# Patient Record
Sex: Male | Born: 1945
Health system: Southern US, Community
[De-identification: ages and names within clinical notes are randomized; demographics above are authoritative.]

## PROBLEM LIST (undated history)

## (undated) DIAGNOSIS — R0989 Other specified symptoms and signs involving the circulatory and respiratory systems: Secondary | ICD-10-CM

## (undated) DIAGNOSIS — I10 Essential (primary) hypertension: Secondary | ICD-10-CM

## (undated) DIAGNOSIS — M1990 Unspecified osteoarthritis, unspecified site: Secondary | ICD-10-CM

## (undated) DIAGNOSIS — C61 Malignant neoplasm of prostate: Secondary | ICD-10-CM

## (undated) DIAGNOSIS — G609 Hereditary and idiopathic neuropathy, unspecified: Principal | ICD-10-CM

## (undated) DIAGNOSIS — R0609 Other forms of dyspnea: Secondary | ICD-10-CM

## (undated) DIAGNOSIS — M79609 Pain in unspecified limb: Secondary | ICD-10-CM

## (undated) DIAGNOSIS — F329 Major depressive disorder, single episode, unspecified: Secondary | ICD-10-CM

## (undated) DIAGNOSIS — K219 Gastro-esophageal reflux disease without esophagitis: Secondary | ICD-10-CM

## (undated) DIAGNOSIS — J449 Chronic obstructive pulmonary disease, unspecified: Secondary | ICD-10-CM

## (undated) DIAGNOSIS — E785 Hyperlipidemia, unspecified: Secondary | ICD-10-CM

## (undated) DIAGNOSIS — J45909 Unspecified asthma, uncomplicated: Secondary | ICD-10-CM

## (undated) DIAGNOSIS — G5600 Carpal tunnel syndrome, unspecified upper limb: Secondary | ICD-10-CM

## (undated) DIAGNOSIS — Z9861 Coronary angioplasty status: Secondary | ICD-10-CM

## (undated) HISTORY — DX: Other specified symptoms and signs involving the circulatory and respiratory systems: R09.89

## (undated) HISTORY — DX: Other forms of dyspnea: R06.09

## (undated) HISTORY — DX: Pain in unspecified limb: M79.609

## (undated) HISTORY — DX: Unspecified asthma, uncomplicated: J45.909

## (undated) HISTORY — DX: Hereditary and idiopathic neuropathy, unspecified: G60.9

## (undated) HISTORY — PX: ROBOT ASSISTED LAPAROSCOPIC RADICAL PROSTATECTOMY: SHX5141

## (undated) HISTORY — PX: HERNIA REPAIR: SHX51

## (undated) HISTORY — DX: Malignant neoplasm of prostate: C61

## (undated) HISTORY — DX: Coronary angioplasty status: Z98.61

## (undated) HISTORY — PX: HIATAL HERNIA REPAIR: SHX195

## (undated) HISTORY — DX: Carpal tunnel syndrome, unspecified upper limb: G56.00

## (undated) HISTORY — DX: Hyperlipidemia, unspecified: E78.5

## (undated) HISTORY — DX: Gastro-esophageal reflux disease without esophagitis: K21.9

## (undated) HISTORY — DX: Major depressive disorder, single episode, unspecified: F32.9

---

## 1995-09-15 HISTORY — PX: CARDIAC CATHETERIZATION: SHX172

## 1998-09-14 HISTORY — PX: LAPAROSCOPIC GASTRIC BANDING: SHX1100

## 2006-08-17 ENCOUNTER — Encounter: Admission: RE | Admit: 2006-08-17 | Discharge: 2006-08-17 | Payer: Self-pay | Admitting: Gastroenterology

## 2007-02-24 ENCOUNTER — Ambulatory Visit: Payer: Self-pay | Admitting: Cardiology

## 2007-02-28 ENCOUNTER — Encounter: Admission: RE | Admit: 2007-02-28 | Discharge: 2007-02-28 | Payer: Self-pay | Admitting: Internal Medicine

## 2007-03-10 ENCOUNTER — Ambulatory Visit: Payer: Self-pay

## 2007-03-10 ENCOUNTER — Encounter: Payer: Self-pay | Admitting: Cardiology

## 2007-03-23 ENCOUNTER — Ambulatory Visit: Payer: Self-pay | Admitting: Cardiology

## 2008-08-27 ENCOUNTER — Encounter: Admission: RE | Admit: 2008-08-27 | Discharge: 2008-08-27 | Payer: Self-pay | Admitting: Orthopaedic Surgery

## 2009-03-21 ENCOUNTER — Inpatient Hospital Stay (HOSPITAL_COMMUNITY): Admission: RE | Admit: 2009-03-21 | Discharge: 2009-03-22 | Payer: Self-pay | Admitting: Urology

## 2009-03-21 ENCOUNTER — Encounter: Payer: Self-pay | Admitting: Urology

## 2009-06-04 DIAGNOSIS — K219 Gastro-esophageal reflux disease without esophagitis: Secondary | ICD-10-CM | POA: Insufficient documentation

## 2009-06-04 DIAGNOSIS — R0609 Other forms of dyspnea: Secondary | ICD-10-CM | POA: Insufficient documentation

## 2009-06-04 DIAGNOSIS — Z9861 Coronary angioplasty status: Secondary | ICD-10-CM

## 2009-06-04 DIAGNOSIS — R0989 Other specified symptoms and signs involving the circulatory and respiratory systems: Secondary | ICD-10-CM

## 2009-06-04 DIAGNOSIS — M79609 Pain in unspecified limb: Secondary | ICD-10-CM | POA: Insufficient documentation

## 2009-06-04 HISTORY — DX: Other forms of dyspnea: R06.09

## 2009-06-04 HISTORY — DX: Coronary angioplasty status: Z98.61

## 2009-06-04 HISTORY — DX: Other specified symptoms and signs involving the circulatory and respiratory systems: R09.89

## 2009-06-04 HISTORY — DX: Pain in unspecified limb: M79.609

## 2009-06-05 ENCOUNTER — Encounter: Admission: RE | Admit: 2009-06-05 | Discharge: 2009-06-05 | Payer: Self-pay | Admitting: General Surgery

## 2009-06-26 ENCOUNTER — Inpatient Hospital Stay (HOSPITAL_COMMUNITY): Admission: RE | Admit: 2009-06-26 | Discharge: 2009-07-05 | Payer: Self-pay | Admitting: General Surgery

## 2010-09-14 HISTORY — PX: MENISCUS REPAIR: SHX5179

## 2010-12-18 LAB — CBC
HCT: 36.6 % — ABNORMAL LOW (ref 39.0–52.0)
HCT: 37.7 % — ABNORMAL LOW (ref 39.0–52.0)
HCT: 38 % — ABNORMAL LOW (ref 39.0–52.0)
HCT: 40 % (ref 39.0–52.0)
HCT: 38.6 % — ABNORMAL LOW (ref 39.0–52.0)
HCT: 41.7 % (ref 39.0–52.0)
Hemoglobin: 12.6 g/dL — ABNORMAL LOW (ref 13.0–17.0)
Hemoglobin: 13 g/dL (ref 13.0–17.0)
Hemoglobin: 13.1 g/dL (ref 13.0–17.0)
Hemoglobin: 13.6 g/dL (ref 13.0–17.0)
Hemoglobin: 13.3 g/dL (ref 13.0–17.0)
Hemoglobin: 14 g/dL (ref 13.0–17.0)
MCHC: 34.1 g/dL (ref 30.0–36.0)
MCHC: 34.4 g/dL (ref 30.0–36.0)
MCHC: 34.5 g/dL (ref 30.0–36.0)
MCHC: 34.5 g/dL (ref 30.0–36.0)
MCHC: 33.7 g/dL (ref 30.0–36.0)
MCHC: 34.5 g/dL (ref 30.0–36.0)
MCV: 95.3 fL (ref 78.0–100.0)
MCV: 95.4 fL (ref 78.0–100.0)
MCV: 95.6 fL (ref 78.0–100.0)
MCV: 96.4 fL (ref 78.0–100.0)
MCV: 95.6 fL (ref 78.0–100.0)
MCV: 95.7 fL (ref 78.0–100.0)
Platelets: 181 10*3/uL (ref 150–400)
Platelets: 235 10*3/uL (ref 150–400)
Platelets: 240 10*3/uL (ref 150–400)
Platelets: 244 10*3/uL (ref 150–400)
Platelets: 218 10*3/uL (ref 150–400)
Platelets: 220 10*3/uL (ref 150–400)
RBC: 3.84 MIL/uL — ABNORMAL LOW (ref 4.22–5.81)
RBC: 3.95 MIL/uL — ABNORMAL LOW (ref 4.22–5.81)
RBC: 3.97 MIL/uL — ABNORMAL LOW (ref 4.22–5.81)
RBC: 4.15 MIL/uL — ABNORMAL LOW (ref 4.22–5.81)
RBC: 4.04 MIL/uL — ABNORMAL LOW (ref 4.22–5.81)
RBC: 4.35 MIL/uL (ref 4.22–5.81)
RDW: 13.5 % (ref 11.5–15.5)
RDW: 13.8 % (ref 11.5–15.5)
RDW: 13.9 % (ref 11.5–15.5)
RDW: 14.3 % (ref 11.5–15.5)
RDW: 14.1 % (ref 11.5–15.5)
RDW: 14.1 % (ref 11.5–15.5)
WBC: 10.8 10*3/uL — ABNORMAL HIGH (ref 4.0–10.5)
WBC: 11.2 10*3/uL — ABNORMAL HIGH (ref 4.0–10.5)
WBC: 12.2 10*3/uL — ABNORMAL HIGH (ref 4.0–10.5)
WBC: 13.5 10*3/uL — ABNORMAL HIGH (ref 4.0–10.5)
WBC: 14.1 10*3/uL — ABNORMAL HIGH (ref 4.0–10.5)
WBC: 8.4 10*3/uL (ref 4.0–10.5)

## 2010-12-18 LAB — URINALYSIS, ROUTINE W REFLEX MICROSCOPIC
Glucose, UA: NEGATIVE mg/dL
Hgb urine dipstick: NEGATIVE
Ketones, ur: 15 mg/dL — AB
Nitrite: NEGATIVE
Protein, ur: 30 mg/dL — AB
Specific Gravity, Urine: 1.026 (ref 1.005–1.030)
Urobilinogen, UA: 0.2 mg/dL (ref 0.0–1.0)
pH: 8 (ref 5.0–8.0)

## 2010-12-18 LAB — BASIC METABOLIC PANEL
BUN: 11 mg/dL (ref 6–23)
BUN: 12 mg/dL (ref 6–23)
BUN: 9 mg/dL (ref 6–23)
BUN: 12 mg/dL (ref 6–23)
CO2: 31 mEq/L (ref 19–32)
CO2: 31 mEq/L (ref 19–32)
CO2: 34 mEq/L — ABNORMAL HIGH (ref 19–32)
CO2: 30 mEq/L (ref 19–32)
Calcium: 8.3 mg/dL — ABNORMAL LOW (ref 8.4–10.5)
Calcium: 8.3 mg/dL — ABNORMAL LOW (ref 8.4–10.5)
Calcium: 8.6 mg/dL (ref 8.4–10.5)
Calcium: 8.8 mg/dL (ref 8.4–10.5)
Chloride: 96 mEq/L (ref 96–112)
Chloride: 99 mEq/L (ref 96–112)
Chloride: 99 mEq/L (ref 96–112)
Chloride: 102 mEq/L (ref 96–112)
Creatinine, Ser: 1.19 mg/dL (ref 0.4–1.5)
Creatinine, Ser: 1.28 mg/dL (ref 0.4–1.5)
Creatinine, Ser: 1.37 mg/dL (ref 0.4–1.5)
Creatinine, Ser: 1.33 mg/dL (ref 0.4–1.5)
GFR calc Af Amer: >60 mL/min (ref >60)
GFR calc Af Amer: >60 mL/min (ref >60)
GFR calc Af Amer: >60 mL/min (ref >60)
GFR calc Af Amer: >60 mL/min (ref >60)
GFR calc non Af Amer: 53 mL/min — ABNORMAL LOW (ref >60)
GFR calc non Af Amer: 57 mL/min — ABNORMAL LOW (ref >60)
GFR calc non Af Amer: >60 mL/min (ref >60)
GFR calc non Af Amer: 54 mL/min — ABNORMAL LOW (ref >60)
Glucose, Bld: 139 mg/dL — ABNORMAL HIGH (ref 70–99)
Glucose, Bld: 147 mg/dL — ABNORMAL HIGH (ref 70–99)
Glucose, Bld: 153 mg/dL — ABNORMAL HIGH (ref 70–99)
Glucose, Bld: 155 mg/dL — ABNORMAL HIGH (ref 70–99)
Potassium: 3.8 mEq/L (ref 3.5–5.1)
Potassium: 3.9 mEq/L (ref 3.5–5.1)
Potassium: 4 mEq/L (ref 3.5–5.1)
Potassium: 3.7 mEq/L (ref 3.5–5.1)
Sodium: 134 mEq/L — ABNORMAL LOW (ref 135–145)
Sodium: 136 mEq/L (ref 135–145)
Sodium: 138 mEq/L (ref 135–145)
Sodium: 139 mEq/L (ref 135–145)

## 2010-12-18 LAB — COMPREHENSIVE METABOLIC PANEL
ALT: 24 U/L (ref 0–53)
AST: 23 U/L (ref 0–37)
Albumin: 4.1 g/dL (ref 3.5–5.2)
Alkaline Phosphatase: 66 U/L (ref 39–117)
BUN: 15 mg/dL (ref 6–23)
CO2: 29 mEq/L (ref 19–32)
Calcium: 9.7 mg/dL (ref 8.4–10.5)
Chloride: 103 mEq/L (ref 96–112)
Creatinine, Ser: 1.25 mg/dL (ref 0.4–1.5)
GFR calc Af Amer: >60 mL/min (ref >60)
GFR calc non Af Amer: 59 mL/min — ABNORMAL LOW (ref >60)
Glucose, Bld: 88 mg/dL (ref 70–99)
Potassium: 4.4 mEq/L (ref 3.5–5.1)
Sodium: 138 mEq/L (ref 135–145)
Total Bilirubin: 0.5 mg/dL (ref 0.3–1.2)
Total Protein: 7.1 g/dL (ref 6.0–8.3)

## 2010-12-18 LAB — CLOSTRIDIUM DIFFICILE EIA
C difficile Toxins A+B, EIA: NEGATIVE
C difficile Toxins A+B, EIA: NEGATIVE

## 2010-12-18 LAB — URINE MICROSCOPIC-ADD ON

## 2010-12-18 LAB — DIFFERENTIAL
Basophils Absolute: 0.1 10*3/uL (ref 0.0–0.1)
Basophils Relative: 1 % (ref 0–1)
Eosinophils Absolute: 0.2 10*3/uL (ref 0.0–0.7)
Eosinophils Relative: 2 % (ref 0–5)
Lymphocytes Relative: 37 % (ref 12–46)
Lymphs Abs: 3 10*3/uL (ref 0.7–4.0)
Monocytes Absolute: 0.6 10*3/uL (ref 0.1–1.0)
Monocytes Relative: 7 % (ref 3–12)
Neutro Abs: 4.4 10*3/uL (ref 1.7–7.7)
Neutrophils Relative %: 54 % (ref 43–77)

## 2010-12-18 LAB — MAGNESIUM: Magnesium: 2.2 mg/dL (ref 1.5–2.5)

## 2010-12-21 LAB — CREATININE, FLUID (PLEURAL, PERITONEAL, JP DRAINAGE): Creat, Fluid: 31.8 mg/dL

## 2010-12-21 LAB — HEMOGLOBIN AND HEMATOCRIT, BLOOD
HCT: 34.7 % — ABNORMAL LOW (ref 39.0–52.0)
HCT: 39.1 % (ref 39.0–52.0)
Hemoglobin: 11.6 g/dL — ABNORMAL LOW (ref 13.0–17.0)
Hemoglobin: 13.2 g/dL (ref 13.0–17.0)

## 2010-12-21 LAB — TYPE AND SCREEN
ABO/RH(D): O POS
Antibody Screen: NEGATIVE

## 2010-12-21 LAB — ABO/RH: ABO/RH(D): O POS

## 2010-12-22 LAB — CBC
HCT: 42.2 % (ref 39.0–52.0)
Hemoglobin: 14.3 g/dL (ref 13.0–17.0)
MCHC: 34 g/dL (ref 30.0–36.0)
MCV: 94.9 fL (ref 78.0–100.0)
Platelets: 225 10*3/uL (ref 150–400)
RBC: 4.44 MIL/uL (ref 4.22–5.81)
RDW: 13.9 % (ref 11.5–15.5)
WBC: 8.1 10*3/uL (ref 4.0–10.5)

## 2010-12-22 LAB — BASIC METABOLIC PANEL
BUN: 17 mg/dL (ref 6–23)
CO2: 31 mEq/L (ref 19–32)
Calcium: 9.2 mg/dL (ref 8.4–10.5)
Chloride: 104 mEq/L (ref 96–112)
Creatinine, Ser: 1.28 mg/dL (ref 0.4–1.5)
GFR calc Af Amer: >60 mL/min (ref >60)
GFR calc non Af Amer: 57 mL/min — ABNORMAL LOW (ref >60)
Glucose, Bld: 119 mg/dL — ABNORMAL HIGH (ref 70–99)
Potassium: 4.2 mEq/L (ref 3.5–5.1)
Sodium: 139 mEq/L (ref 135–145)

## 2011-01-27 NOTE — Assessment & Plan Note (Signed)
 HEALTHCARE                            CARDIOLOGY OFFICE NOTE   NAME:[name] [name], [name] [name]                       MRN:          [id number]  DATE:03/23/2007                            DOB:          05-Apr-1946    PRIMARY [name]:  [name], NP.   REASON FOR VISIT:  Followup cardiac testing.   HISTORY OF PRESENT ILLNESS:  I saw [name] [name] in June.  He was referred  for baseline cardiac risk stratification, as well as assessment for  potential claudication.  His echocardiogram demonstrated an ejection  fraction of 45% to 50% with possible inferior posterior hypokinesis,  although this was a technically difficult study with poor sound wave  transmission, and his ejection fraction was actually normal by Myoview  with no indication of wall motion abnormality.  His Myoview indicated an  ejection fraction of 64% with no evidence of ischemia and no  electrocardiographic changes to suggest ischemia.  I reviewed this with  the patient and his wife today.  In addition, he had lower extremity  arterial studies which were essentially normal with normal ABIs.  Based  on this, my recommendation would be risk factor modification.   ALLERGIES:  CONTRAST DYE, CODEINE, and OXYCODONE.   PRESENT MEDICATIONS:  1. Imodium 2 mg p.o. b.i.d.  2. Singulair 10 mg p.o. daily.  3. Spiriva p.r.n.  4. Combivent p.r.n.  5. Darvocet p.r.n.   REVIEW OF SYSTEMS:  As described in the history of present illness.   PHYSICAL EXAMINATION:  Blood pressure 122/80, heart rate 86, weight is  250 pounds.  Patient is comfortable and in no acute distress.  There has been no significant change in his examination which is  documented in the previous note.   IMPRESSION/RECOMMENDATIONS:  Dyspnea on exertion as outlined previously.  Cardiovascular testing has been reassuring, and, overall, low risk.  At  this point, would anticipate basic lifestyle modification with diet and  weight loss, as well  as regular exercise as tolerated.  If he manifests  progressive symptoms, we could consider additional cardiology  evaluation, although at this point do not anticipate any additional  studies.  He will continue to follow up with his primary care [name]  and we can see him back as needed.     Jonelle Sidle, MD  Electronically Signed    SGM/MedQ  DD: 03/23/2007  DT: 03/23/2007  Job #: [id number]   cc:   [name], NP  [name], M.D.

## 2011-01-27 NOTE — Assessment & Plan Note (Signed)
[facility] HEALTHCARE                            CARDIOLOGY OFFICE NOTE   NAME:[name], [name] SCHEIDT                       MRN:          [id number]  DATE:02/24/2007                            DOB:          [birth year]    REFERRING PHYSICIAN:  [name], NP   REASON FOR CONSULTATION:  Dyspnea on exertion and lower extremity pain.   HISTORY OF PRESENT ILLNESS:  [name] [name] is a pleasant 65 year old male,  retired Hotel manager, with a history of gastroesophageal reflux disease with  previous hiatal hernia repair and gastric banding at [facility] in  2000. It sounds as if he has been diagnosed with possible exercise-  induced asthma and perhaps even exacerbation of asthma due to reflux  disease. He also underwent previous cardiac catheterization in 1997,  which reportedly showed no major obstructive disease. He reports a 3 to  4 year history of progressive dyspnea on exertion, much worse over the  last several months. He also experiences heaviness in his legs with  exertion. He has no frank chest pain or palpitations. He is also limited  by back and hip discomfort. He was just recently seen by rheumatologist  and is being evaluated for osteoarthritis, rule out ankylosing  spondylitis.   His resting electrocardiogram today is normal showing sinus rhythm of 70  beats per minute and he has had no interval ischemic testing over the  last ten years. We have been asked to evaluate him further.   ALLERGIES:  X-RAY CONTRAST DYE, CODEINE, OXYCODONE.   CURRENT MEDICATIONS:  1. Loperamide 2 mg p.o. b.i.d.  2. Singulair 10 mg p.o. daily.  3. Spiriva inhaler p.r.n.  4. Combivent inhaler p.r.n.  5. Darvocet N100 p.r.n.   PAST MEDICAL HISTORY:  Is as outlined above. The patient also reports an  incisional hernia repair following his original gastric banding surgery.   SOCIAL HISTORY:  The patient is married, has one child. He has a prior  history of tobacco use, but quit in  1997. Drinks two alcoholic beverages  per week. Has not been exercising regularly but previously exercised  frequently, running on a regular basis.   FAMILY HISTORY:  Significant for premature cardiovascular disease in the  patient's uncles. He had uncles who died in their 50s suddenly with  heart disease.   REVIEW OF SYSTEMS:  As described in History of Present Illness. He has  seasonal allergies, some history of prostatitis. Also fatigue in  general.   PHYSICAL EXAMINATION:  Blood pressure 122/81, heart rate is 70, weight  is 225 pounds. This is an obese male in no acute distress.  HEENT: Conjunctivae is normal. Oropharynx is clear.  NECK: Supple, without elevated jugular pressure, without bruits. No  thyromegaly is noted.  LUNGS:  Are clear without labored breathing at rest. No wheezing is  noted.  CARDIAC: Reveals a regular rate and rhythm without loud murmur, rub or  gallop.  ABDOMEN: Obese, well-healed abdominal incisions. No obvious  hepatomegaly. No bruits are evident.  EXTREMITIES: Exhibit normal hair distribution, 2+ posterior tibial  pulses. No pitting edema.  SKIN: Is warm  and dry.  MUSCULOSKELETAL: No kyphosis is noted.  NEURO/PSYCHIATRIC: The patient is alert and oriented x3. Affect is  normal.   IMPRESSIONS AND RECOMMENDATIONS:  1. Progressive dyspnea on exertion in a 65 year old obese male with a      remote history of tobacco use and family history of premature      cardiovascular disease. This is also complicated by a history of      what sounds to be fairly significant reflux disease realted to      asthma and possibly even exercise induced asthma. His resting      electrocardiogram is normal. He also reports leg heaviness with      activity raising the possibility of claudication. We talked about a      number of issues today including both invasive and non-invasive      techniques for further assessment of his cardiac status. At this      point, he is most  inclined to proceed with noninvasive testing and      will therefore schedule an echocardiogram to assess left      ventricular function as well as an exercise Myoview to assess for      ischemia. Lower extremity arterial studies will also be obtained.      Following this, we will have him return to the office and discuss      the situation further.  2. Further plans to follow.     Jonelle Sidle, MD  Electronically Signed    SGM/MedQ  DD: 02/24/2007  DT: 02/24/2007  Job #: [id number]   cc:   [name] [name], M.D.  [name], NP

## 2011-01-27 NOTE — Op Note (Signed)
[name], [name]                ACCOUNT NO.:  [id number]   MEDICAL RECORD NO.:  [id number]          PATIENT TYPE:  INP   LOCATION:  0001                         FACILITY:  [facility]   PHYSICIAN:  Excell Seltzer. Annabell Howells, M.D.    DATE OF BIRTH:  Dec 29, 1945   DATE OF PROCEDURE:  03/21/2009  DATE OF DISCHARGE:                               OPERATIVE REPORT   PROCEDURE:  Robot assisted laparoscopic radical prostatectomy.   PREOPERATIVE DIAGNOSIS:  T1C Gleason 6 adenocarcinoma of the prostate.   POSTOPERATIVE DIAGNOSIS:  T1C Gleason 6 adenocarcinoma of the prostate.   SURGEON:  Excell Seltzer. Annabell Howells, M.D.   ASSISTANT:  Delia Chimes, NP   ANESTHESIA:  General with local wound infiltration.   DRAINS:  20 [place] coude Foley and #19 round Blake drain.   SPECIMEN:  Prostate and seminal vesicles.   BLOOD LOSS:  Approximately 250 mL.   COMPLICATIONS:  None.   INDICATIONS:  [name] [name] is a 65 year old white male who initially  presented with an elevated PSA of 3.88.  He was found to have a Gleason  6 T1C adenocarcinoma of the prostate and a 50 mL gland.  He did have  significant obstructive symptoms with an IPSS score of 26.  His SHIM  score was 24 consistent with good erectile function.  He has elected  robot assisted laparoscopic radical prostatectomy with nerve spare.   FINDINGS AND PROCEDURE:  The patient was given routine preoperative  mechanical bowel prep.  He was taken to the operating room where general  anesthetic was induced.  He was given a gram of Ancef.  He was fitted  with PAS hose and placed in low lithotomy position.  A red rubber rectal  catheter was placed.  This was secured with tape and attached to a bulb  syringe.  His abdomen was clipped.  He was prepped with Betadine  solution and then placed in Trendelenburg with the Mayo stand over his  head then draped in the usual sterile fashion with towels, laparotomy  drape and an OpSite.  At this point a time-out was performed.  A 22  French Foley catheter was placed and the balloon was filled with 30 mL  of sterile fluid.  The bladder was drained and then the catheter was  placed to irrigation tubing.  The video port incision was marked 18 cm  superior to the pubis just lateral to the umbilicus on the left.  An  incision was made vertically approximately 2 cm in length with the  knife.  This was extended through the dermis into the subcutaneous  tissues using a Bovie.  Then using Army-Navy retractors followed by  appendiceal retractors the subcutaneous tissue was bluntly parted to  expose the anterior rectus fascia which was incised vertically with the  Bovie exposing the rectus muscles.  The rectus muscles were parted  exposing the posterior sheath which was nicked.  A hemostat was used to  attempt to perforate the peritoneum but the perineum was lax against the  anterior abdominal wall and eventually required grasping between  hemostats to open and enter  the abdomen.  Once entered finger palpation  of the abdominal cavity revealed no significant adhesions and a 12 mm  laparoscopic port was placed.  The skin and subcutaneous tissues were  secured in occlusive fashion around the port using a zero Vicryl figure-  of-eight suture.  The scope was then placed.  Inspection of the abdomen  revealed no significant adhesions.  There were some minor adhesions of  the sigmoid to the area of the obliterated umbilical artery on the left.  Once inspection of the abdomen had been performed the remaining port  sites were measured and marked in the standard configuration with two  robot ports on the left, a robot port 5 mm assistant port and 12 mm  assistant port on the right.  The port sites were infiltrated with  quarter percent Marcaine with epinephrine.  Each of the sites was  incised and respective trocars were placed under visual guidance without  complication.  A total of 23 mL of quarter percent Marcaine with  epinephrine were  used during the procedure.  Once all ports were in  position the robot was prepared and docked.  The right arm was fitted  with cautery scissors, the left medial arm with a PK dissector and the  left lateral arm with a ProGrasp.  At this point the dissection was  initiated by incising the peritoneum over the anterior abdominal wall  and dividing the obliterated umbilical arteries.  The bladder was then  released from the anterior abdominal wall.  It had been filled with  fluid to aid dissection.  This was drained once the pubis was  identified.  The dissection was then carried down under the pubis.  The  anterior prostate was identified and defatted.  The lateral prostatic  planes were developed and endopelvic fascia was incised on each side  freeing the prostate from the levator attachments.  Once this dissection  had been performed we turned our attention to the apex where  puboprostatic ligaments were divided using cautery.  The dorsal vein was  dissected out until the plane between the urethra and the dorsal vein  could be identified.  An Endo-GIA was then used to ligate and divide the  dorsal vein complex.   At this point Foley traction was used to identify the bladder neck which  was incised transversely with the cautery scissors.  Once the bladder  neck was entered the Foley catheter balloon was deflated.  The catheter  was then used to provide cephalad traction.  The posterior bladder neck  was then divided.  On the left I did get down on the mucosa and then  developed a second flap to make sure proper margin was present on the  bladder side.  The anterior of the left vas was identified, grasped with  the PK dissector and dissected out using cautery dissection.  This was  then used to provide anterior traction on the prostate.  The PK  dissector was then used to provide anterior traction and the ProGrasp  was used for dissection of the structure.  The left seminal vesicle was   dissected out using cautery dissection followed by the right ampulla of  vas and right seminal vesicle.  Once all the structures had been  dissected out the posterior Denonvilliers sheath was incised and the  posterior plane was developed between the prostate and the rectum out to  the apex and as laterally as possible.   At this point the prostate was  placed on lateral traction using the  ProGrasp and the right neurovascular bundle dissection was performed.  The periprostatic fascia was incised anteriorly and a plane was  developed between the neurovascular bundle on the prostate from the base  to the apex.  Once this dissection had been completed the right pedicle  was taken down using large Hem-o-lok clips.  We then turned our  attention to the left nerve spare dissection.  The periprostatic fascia  was incised anteriorly as before and the plane was developed between the  neurovascular bundle on the prostate from apex to base.  Once this  dissection had been performed the prostatic pedicle on the left was  taken down using large Hem-o-lok clips.  Some residual lateral  attachments were then dissected out freeing the prostate up to the apex.  I then turned our attention to the apex with the residual dorsal vein  complex was divided using cautery scissors.  The urethra was then  divided using cold scissors.  Once the urethra had been divided some  residual rectourethral attachments were taken down freeing the prostate  from the pelvis.  It was then moved out of the field.  Inspection  revealed mild oozing from the left neurovascular bundle that was felt to  be insufficient to require attention at this time.   A 2-0 Vicryl suture on an SH needle was then passed and the bladder neck  was reconstructed to ensure that the thin area of mucosa which actually  tore posteriorly was closed and the bladder neck was tightened.  A  figure-of-eight was used in this location with excellent repair of  the  bladder neck.  A second 2-0 Vicryl was then used to approximate the cut  edge of Denonvilliers fascia, the posterior bladder neck, the urethra  and rectourethralis complex and using a slip-knot the bladder neck was  brought down to the urethral stump.  At this point a bicolor 4-0  Monocryl suture was passed and a standard running anastomosis was  performed.  Once the anastomosis was completed and the first surgeon's  knot placed a fresh 20 [place] coude catheter was inserted.  The balloon  was filled with 15 mL of sterile fluid and the catheter was irrigated  with no evidence of an anastomotic leak.  The knot was then completed  and trimmed.  At this point there remained some minimal oozing around  the dorsal vein complex and bladder neck so 5 mL of FloSeal was prepared  and placed in this area to achieve additional hemostasis.  This was  successful.  We did notice some minor bleeding from the anterior  abdominal wall which was controlled with the cautery scissors.   At this point the fourth arm was removed and a 19 round Blake drain was  placed through the fourth arm port and secured to the skin with 3-0  nylon suture.  The robot was undocked and the 12 mm right assistance  port was closed using a 0 Vicryl suture and needle passer.  The prostate  was placed in an entrapment sac through the port.  The 12 mm working  ports were removed and the track was closed using a 0 Vicryl and a  needle passer.  The remaining ports were then removed under visual  inspection without evidence of bleeding.  The video port was removed and  the figure-of-eight suture was released.  The fascial incision was then  extended sufficient to remove the prostate which was done without  difficulty.  The anterior rectus sheath was then closed using a running  #1 Vicryl suture.  The port sites were infiltrated once again with  quarter percent Marcaine with epinephrine.  The total was noted  previously and the  port sites were closed with clips.  The Foley was  irrigated with return of slightly bloody blue stained urine and the  catheter was then placed to straight drainage.  The drain was cut to  length and placed to bulb suction.  The drapes were removed and  dressings were applied.  The patient's anesthetic was reversed.  He was  removed to the recovery room in stable condition.  There were no  complications.      Excell Seltzer. Annabell Howells, M.D.  Electronically Signed     JJW/MEDQ  D:  03/21/2009  T:  03/21/2009  Job:  [id number]   cc:   [name], M.D.  Fax: [phone]

## 2011-05-15 ENCOUNTER — Ambulatory Visit (INDEPENDENT_AMBULATORY_CARE_PROVIDER_SITE_OTHER): Admitting: Urology

## 2011-05-15 DIAGNOSIS — R6882 Decreased libido: Secondary | ICD-10-CM

## 2011-05-15 DIAGNOSIS — Z8546 Personal history of malignant neoplasm of prostate: Secondary | ICD-10-CM

## 2011-05-15 DIAGNOSIS — N529 Male erectile dysfunction, unspecified: Secondary | ICD-10-CM

## 2011-09-22 DIAGNOSIS — H2510 Age-related nuclear cataract, unspecified eye: Secondary | ICD-10-CM | POA: Diagnosis not present

## 2011-10-27 DIAGNOSIS — E782 Mixed hyperlipidemia: Secondary | ICD-10-CM | POA: Diagnosis not present

## 2011-10-27 DIAGNOSIS — D510 Vitamin B12 deficiency anemia due to intrinsic factor deficiency: Secondary | ICD-10-CM | POA: Diagnosis not present

## 2011-10-27 DIAGNOSIS — Z79899 Other long term (current) drug therapy: Secondary | ICD-10-CM | POA: Diagnosis not present

## 2011-10-27 DIAGNOSIS — Z23 Encounter for immunization: Secondary | ICD-10-CM | POA: Diagnosis not present

## 2011-10-27 DIAGNOSIS — K589 Irritable bowel syndrome without diarrhea: Secondary | ICD-10-CM | POA: Diagnosis not present

## 2011-10-27 DIAGNOSIS — E291 Testicular hypofunction: Secondary | ICD-10-CM | POA: Diagnosis not present

## 2011-10-27 DIAGNOSIS — J45909 Unspecified asthma, uncomplicated: Secondary | ICD-10-CM | POA: Diagnosis not present

## 2011-11-16 DIAGNOSIS — IMO0002 Reserved for concepts with insufficient information to code with codable children: Secondary | ICD-10-CM | POA: Diagnosis not present

## 2011-12-28 DIAGNOSIS — M5137 Other intervertebral disc degeneration, lumbosacral region: Secondary | ICD-10-CM | POA: Diagnosis not present

## 2012-01-25 DIAGNOSIS — L819 Disorder of pigmentation, unspecified: Secondary | ICD-10-CM | POA: Diagnosis not present

## 2012-01-25 DIAGNOSIS — L820 Inflamed seborrheic keratosis: Secondary | ICD-10-CM | POA: Diagnosis not present

## 2012-01-25 DIAGNOSIS — L570 Actinic keratosis: Secondary | ICD-10-CM | POA: Diagnosis not present

## 2012-01-25 DIAGNOSIS — L578 Other skin changes due to chronic exposure to nonionizing radiation: Secondary | ICD-10-CM | POA: Diagnosis not present

## 2012-01-25 DIAGNOSIS — L821 Other seborrheic keratosis: Secondary | ICD-10-CM | POA: Diagnosis not present

## 2012-01-27 DIAGNOSIS — M5137 Other intervertebral disc degeneration, lumbosacral region: Secondary | ICD-10-CM | POA: Diagnosis not present

## 2012-01-27 DIAGNOSIS — M23329 Other meniscus derangements, posterior horn of medial meniscus, unspecified knee: Secondary | ICD-10-CM | POA: Diagnosis not present

## 2012-01-27 DIAGNOSIS — M2550 Pain in unspecified joint: Secondary | ICD-10-CM | POA: Diagnosis not present

## 2012-01-27 DIAGNOSIS — IMO0002 Reserved for concepts with insufficient information to code with codable children: Secondary | ICD-10-CM | POA: Diagnosis not present

## 2012-01-27 DIAGNOSIS — M5430 Sciatica, unspecified side: Secondary | ICD-10-CM | POA: Diagnosis not present

## 2012-03-22 DIAGNOSIS — Z8546 Personal history of malignant neoplasm of prostate: Secondary | ICD-10-CM | POA: Diagnosis not present

## 2012-03-22 DIAGNOSIS — N281 Cyst of kidney, acquired: Secondary | ICD-10-CM | POA: Diagnosis not present

## 2012-03-22 DIAGNOSIS — N529 Male erectile dysfunction, unspecified: Secondary | ICD-10-CM | POA: Diagnosis not present

## 2012-03-28 DIAGNOSIS — N281 Cyst of kidney, acquired: Secondary | ICD-10-CM | POA: Diagnosis not present

## 2012-06-27 DIAGNOSIS — J45909 Unspecified asthma, uncomplicated: Secondary | ICD-10-CM | POA: Diagnosis not present

## 2012-06-27 DIAGNOSIS — E785 Hyperlipidemia, unspecified: Secondary | ICD-10-CM | POA: Diagnosis not present

## 2012-06-27 DIAGNOSIS — Z23 Encounter for immunization: Secondary | ICD-10-CM | POA: Diagnosis not present

## 2012-06-27 DIAGNOSIS — E559 Vitamin D deficiency, unspecified: Secondary | ICD-10-CM | POA: Diagnosis not present

## 2012-06-27 DIAGNOSIS — I10 Essential (primary) hypertension: Secondary | ICD-10-CM | POA: Diagnosis not present

## 2012-12-12 ENCOUNTER — Other Ambulatory Visit: Payer: Self-pay | Admitting: Nurse Practitioner

## 2012-12-16 DIAGNOSIS — IMO0002 Reserved for concepts with insufficient information to code with codable children: Secondary | ICD-10-CM | POA: Diagnosis not present

## 2012-12-16 DIAGNOSIS — M47817 Spondylosis without myelopathy or radiculopathy, lumbosacral region: Secondary | ICD-10-CM | POA: Diagnosis not present

## 2012-12-16 DIAGNOSIS — M545 Low back pain, unspecified: Secondary | ICD-10-CM | POA: Diagnosis not present

## 2012-12-20 DIAGNOSIS — M545 Low back pain, unspecified: Secondary | ICD-10-CM | POA: Diagnosis not present

## 2012-12-20 DIAGNOSIS — IMO0002 Reserved for concepts with insufficient information to code with codable children: Secondary | ICD-10-CM | POA: Diagnosis not present

## 2012-12-20 DIAGNOSIS — M47817 Spondylosis without myelopathy or radiculopathy, lumbosacral region: Secondary | ICD-10-CM | POA: Diagnosis not present

## 2013-01-16 ENCOUNTER — Other Ambulatory Visit: Payer: Self-pay | Admitting: Family Medicine

## 2013-01-16 DIAGNOSIS — M549 Dorsalgia, unspecified: Secondary | ICD-10-CM

## 2013-01-16 DIAGNOSIS — M545 Low back pain, unspecified: Secondary | ICD-10-CM | POA: Diagnosis not present

## 2013-01-16 DIAGNOSIS — M5430 Sciatica, unspecified side: Secondary | ICD-10-CM | POA: Diagnosis not present

## 2013-01-18 ENCOUNTER — Other Ambulatory Visit: Payer: Self-pay | Admitting: Family Medicine

## 2013-01-18 ENCOUNTER — Ambulatory Visit
Admission: RE | Admit: 2013-01-18 | Discharge: 2013-01-18 | Disposition: A | Discharge status: Home / Self Care | Source: Ambulatory Visit | Attending: Family Medicine | Admitting: Family Medicine

## 2013-01-18 VITALS — BP 149/73 | HR 66

## 2013-01-18 DIAGNOSIS — M549 Dorsalgia, unspecified: Secondary | ICD-10-CM

## 2013-01-18 DIAGNOSIS — M47817 Spondylosis without myelopathy or radiculopathy, lumbosacral region: Secondary | ICD-10-CM | POA: Diagnosis not present

## 2013-01-18 DIAGNOSIS — M5137 Other intervertebral disc degeneration, lumbosacral region: Secondary | ICD-10-CM | POA: Diagnosis not present

## 2013-01-18 MED ORDER — METHYLPREDNISOLONE ACETATE 40 MG/ML INJ SUSP (RADIOLOG
120.0000 mg | Freq: Once | INTRAMUSCULAR | Status: AC
  Administered 2013-01-18: 120 mg via EPIDURAL

## 2013-01-18 MED ORDER — IOHEXOL 180 MG/ML  SOLN
1.0000 mL | Freq: Once | INTRAMUSCULAR | Status: AC | PRN
  Administered 2013-01-18: 1 mL via EPIDURAL

## 2013-01-31 ENCOUNTER — Other Ambulatory Visit: Payer: Self-pay | Admitting: Family Medicine

## 2013-01-31 DIAGNOSIS — M549 Dorsalgia, unspecified: Secondary | ICD-10-CM

## 2013-02-07 ENCOUNTER — Other Ambulatory Visit: Payer: Medicare Other

## 2013-02-07 DIAGNOSIS — M47817 Spondylosis without myelopathy or radiculopathy, lumbosacral region: Secondary | ICD-10-CM | POA: Diagnosis not present

## 2013-02-07 DIAGNOSIS — IMO0002 Reserved for concepts with insufficient information to code with codable children: Secondary | ICD-10-CM | POA: Diagnosis not present

## 2013-02-08 DIAGNOSIS — R1032 Left lower quadrant pain: Secondary | ICD-10-CM | POA: Diagnosis not present

## 2013-02-08 DIAGNOSIS — K5730 Diverticulosis of large intestine without perforation or abscess without bleeding: Secondary | ICD-10-CM | POA: Diagnosis not present

## 2013-02-14 ENCOUNTER — Other Ambulatory Visit: Payer: Self-pay | Admitting: Nurse Practitioner

## 2013-03-23 ENCOUNTER — Other Ambulatory Visit: Payer: Self-pay

## 2013-03-23 MED ORDER — FENOFIBRATE 160 MG PO TABS: 160.0000 mg | ORAL_TABLET | Freq: Every day | ORAL | Status: DC

## 2013-03-23 MED ORDER — MONTELUKAST SODIUM 10 MG PO TABS: 10.0000 mg | ORAL_TABLET | Freq: Every day | ORAL | Status: DC

## 2013-03-23 MED ORDER — BUDESONIDE-FORMOTEROL FUMARATE 160-4.5 MCG/ACT IN AERO: 2.0000 | INHALATION_SPRAY | Freq: Two times a day (BID) | RESPIRATORY_TRACT | Status: DC

## 2013-03-23 MED ORDER — CITALOPRAM HYDROBROMIDE 20 MG PO TABS: 20.0000 mg | ORAL_TABLET | Freq: Every day | ORAL | Status: DC

## 2013-03-23 MED ORDER — SIMVASTATIN 40 MG PO TABS: 40.0000 mg | ORAL_TABLET | Freq: Every day | ORAL | Status: DC

## 2013-03-23 NOTE — Telephone Encounter (Signed)
Last seen 06/27/12  Last lipids 06/26/13  MMM   Upcoming appt. 04/26/13

## 2013-04-24 ENCOUNTER — Ambulatory Visit: Payer: Self-pay | Admitting: Nurse Practitioner

## 2013-04-26 ENCOUNTER — Ambulatory Visit: Payer: Self-pay | Admitting: Nurse Practitioner

## 2013-06-02 ENCOUNTER — Ambulatory Visit (INDEPENDENT_AMBULATORY_CARE_PROVIDER_SITE_OTHER): Payer: Medicare Other

## 2013-06-02 ENCOUNTER — Ambulatory Visit (INDEPENDENT_AMBULATORY_CARE_PROVIDER_SITE_OTHER): Payer: Medicare Other | Admitting: Nurse Practitioner

## 2013-06-02 ENCOUNTER — Encounter: Payer: Self-pay | Admitting: Nurse Practitioner

## 2013-06-02 VITALS — BP 140/72 | HR 66 | Temp 97.3°F | Ht 74.0 in | Wt 254.0 lb

## 2013-06-02 DIAGNOSIS — F32A Depression, unspecified: Secondary | ICD-10-CM | POA: Insufficient documentation

## 2013-06-02 DIAGNOSIS — R7989 Other specified abnormal findings of blood chemistry: Secondary | ICD-10-CM

## 2013-06-02 DIAGNOSIS — J45909 Unspecified asthma, uncomplicated: Secondary | ICD-10-CM

## 2013-06-02 DIAGNOSIS — I4891 Unspecified atrial fibrillation: Secondary | ICD-10-CM

## 2013-06-02 DIAGNOSIS — J4489 Other specified chronic obstructive pulmonary disease: Secondary | ICD-10-CM | POA: Insufficient documentation

## 2013-06-02 DIAGNOSIS — Z0000 Encounter for general adult medical examination without abnormal findings: Secondary | ICD-10-CM

## 2013-06-02 DIAGNOSIS — F3289 Other specified depressive episodes: Secondary | ICD-10-CM | POA: Diagnosis not present

## 2013-06-02 DIAGNOSIS — F329 Major depressive disorder, single episode, unspecified: Secondary | ICD-10-CM | POA: Diagnosis not present

## 2013-06-02 DIAGNOSIS — Z8546 Personal history of malignant neoplasm of prostate: Secondary | ICD-10-CM | POA: Diagnosis not present

## 2013-06-02 DIAGNOSIS — E785 Hyperlipidemia, unspecified: Secondary | ICD-10-CM | POA: Insufficient documentation

## 2013-06-02 HISTORY — DX: Depression, unspecified: F32.A

## 2013-06-02 HISTORY — DX: Unspecified asthma, uncomplicated: J45.909

## 2013-06-02 HISTORY — DX: Hyperlipidemia, unspecified: E78.5

## 2013-06-02 MED ORDER — MONTELUKAST SODIUM 10 MG PO TABS: 10.0000 mg | ORAL_TABLET | Freq: Every day | ORAL | Status: DC

## 2013-06-02 MED ORDER — BUDESONIDE-FORMOTEROL FUMARATE 160-4.5 MCG/ACT IN AERO: 2.0000 | INHALATION_SPRAY | Freq: Two times a day (BID) | RESPIRATORY_TRACT | Status: DC

## 2013-06-02 MED ORDER — CITALOPRAM HYDROBROMIDE 20 MG PO TABS: 20.0000 mg | ORAL_TABLET | Freq: Every day | ORAL | Status: DC

## 2013-06-02 MED ORDER — SIMVASTATIN 40 MG PO TABS: 40.0000 mg | ORAL_TABLET | Freq: Every day | ORAL | Status: DC

## 2013-06-02 MED ORDER — FENOFIBRATE 160 MG PO TABS: 160.0000 mg | ORAL_TABLET | Freq: Every day | ORAL | Status: DC

## 2013-06-02 NOTE — Progress Notes (Signed)
Subjective:    Patient ID: [name] [name], male    DOB: 1946/06/18, 67 y.o.   MRN: [id number]  Hyperlipidemia This is a chronic problem. The current episode started more than 1 year ago. The problem is uncontrolled. Recent lipid tests were reviewed and are high. Exacerbating diseases include obesity. He has no history of diabetes. Pertinent negatives include no leg pain or myalgias. Current antihyperlipidemic treatment includes statins. The current treatment provides moderate improvement of lipids. Compliance problems include adherence to diet.  Risk factors for coronary artery disease include family history, male sex and obesity.   Depression Pt takes Celexa 20 mg-Well controlled  Asthma  Pt states his asthma is controlled- Pt states he uses his rescue inhaler 3-4 times a month-when he works outside- Also takes singulair 10 mg daily   Review of Systems  Musculoskeletal: Negative for myalgias.  Neurological: Positive for dizziness and light-headedness.  All other systems reviewed and are negative.       Objective:   Physical Exam  Vitals reviewed. Constitutional: He is oriented to person, place, and time. He appears well-developed and well-nourished.  HENT:  Head: Normocephalic and atraumatic.  Right Ear: External ear normal.  Left Ear: External ear normal.  Nose: Nose normal.  Mouth/Throat: Oropharynx is clear and moist.  Eyes: EOM are normal. Pupils are equal, round, and reactive to light.  Neck: Normal range of motion. Neck supple. No thyromegaly present.  Cardiovascular: Normal rate, regular rhythm, normal heart sounds and intact distal pulses.   Pulmonary/Chest: Effort normal and breath sounds normal. No respiratory distress. He has no wheezes.  Abdominal: Soft. Bowel sounds are normal.  Musculoskeletal: Normal range of motion. He exhibits no edema and no tenderness.  Pt has peripheral numbness present in bilateral feet  Neurological: He is alert and oriented to person,  place, and time.  Skin: Skin is warm and dry.  Psychiatric: He has a normal mood and affect. His behavior is normal. Judgment and thought content normal.     BP 145/66  Pulse 66  Temp(Src) 97.3 F (36.3 C) (Oral)  Ht 6\' 2"  (1.88 m)  Wt 254 lb (115.214 kg)  BMI 32.6 kg/m2  Chest xray- no acute findings-Preliminary reading by [name], FNP  [facility]  EKG-atrial fib-  [place] score-1    Assessment & Plan:   1. Hyperlipemia   2. Asthma, chronic   3. Depression   4. Annual physical exam   5. Hyperlipidemia LDL goal < 100   6. Atrial fib  Orders Placed This Encounter  Procedures  . DG Chest 2 View    Standing Status: Future     Number of Occurrences:      Standing Expiration Date: 08/02/2014    Order Specific Question:  Reason for Exam (SYMPTOM  OR DIAGNOSIS REQUIRED)    Answer:  screening    Order Specific Question:  Preferred imaging location?    Answer:  Internal   Meds ordered this encounter  Medications  . Naproxen Sodium (NAPRELAN) 750 MG TB24    Sig: Take 750 mg by mouth daily.  . simvastatin (ZOCOR) 40 MG tablet    Sig: Take 1 tablet (40 mg total) by mouth at bedtime.    Dispense:  90 tablet    Refill:  1  . montelukast (SINGULAIR) 10 MG tablet    Sig: Take 1 tablet (10 mg total) by mouth at bedtime.    Dispense:  90 tablet    Refill:  1  . fenofibrate 160  MG tablet    Sig: Take 1 tablet (160 mg total) by mouth daily.    Dispense:  90 tablet    Refill:  1  . citalopram (CELEXA) 20 MG tablet    Sig: Take 1 tablet (20 mg total) by mouth daily.    Dispense:  90 tablet    Refill:  1    NTBS  . budesonide-formoterol (SYMBICORT) 160-4.5 MCG/ACT inhaler    Sig: Inhale 2 puffs into the lungs 2 (two) times daily.    Dispense:  3 Inhaler    Refill:  1  make sure take daily ASA Continue all meds Labs pending Follow up in 3 months Health maintenance reviewed Cardiology referral  Mary-Margaret Daphine Deutscher, FNP

## 2013-06-02 NOTE — Patient Instructions (Addendum)
Atrial Fibrillation Your caregiver has diagnosed you with atrial fibrillation (AFib). The heart normally beats very regularly; AFib is a type of irregular heartbeat. The heart rate may be faster or slower than normal. This can prevent your heart from pumping as well as it should. AFib can be constant (chronic) or intermittent (paroxysmal). CAUSES  Atrial fibrillation may be caused by:  Heart disease, including heart attack, coronary artery disease, heart failure, diseases of the heart valves, and others.  Blood clot in the lungs (pulmonary embolism).  Pneumonia or other infections.  Chronic lung disease.  Thyroid disease.  Toxins. These include alcohol, some medications (such as decongestant medications or diet pills), and caffeine. In some people, no cause for AFib can be found. This is referred to as Lone Atrial Fibrillation. SYMPTOMS   Palpitations or a fluttering in your chest.  A vague sense of chest discomfort.  Shortness of breath.  Sudden onset of lightheadedness or weakness. Sometimes, the first sign of AFib can be a complication of the condition. This could be a stroke or heart failure. DIAGNOSIS  Your description of your condition may make your caregiver suspicious of atrial fibrillation. Your caregiver will examine your pulse to determine if fibrillation is present. An EKG (electrocardiogram) will confirm the diagnosis. Further testing may help determine what caused you to have atrial fibrillation. This may include chest x-ray, echocardiogram, blood tests, or CT scans. PREVENTION  If you have previously had atrial fibrillation, your caregiver may advise you to avoid substances known to cause the condition (such as stimulant medications, and possibly caffeine or alcohol). You may be advised to use medications to prevent recurrence. Proper treatment of any underlying condition is important to help prevent recurrence. PROGNOSIS  Atrial fibrillation does tend to become a  chronic condition over time. It can cause significant complications (see below). Atrial fibrillation is not usually immediately life-threatening, but it can shorten your life expectancy. This seems to be worse in women. If you have lone atrial fibrillation and are under 60 years old, the risk of complications is very low, and life expectancy is not shortened. RISKS AND COMPLICATIONS  Complications of atrial fibrillation can include stroke, chest pain, and heart failure. Your caregiver will recommend treatments for the atrial fibrillation, as well as for any underlying conditions, to help minimize risk of complications. TREATMENT  Treatment for AFib is divided into several categories:  Treatment of any underlying condition.  Converting you out of AFib into a regular (sinus) rhythm.  Controlling rapid heart rate.  Prevention of blood clots and stroke. Medications and procedures are available to convert your atrial fibrillation to sinus rhythm. However, recent studies have shown that this may not offer you any advantage, and cardiac experts are continuing research and debate on this topic. More important is controlling your rapid heartbeat. The rapid heartbeat causes more symptoms, and places strain on your heart. Your caregiver will advise you on the use of medications that can control your heart rate. Atrial fibrillation is a strong stroke risk. You can lessen this risk by taking blood thinning medications such as Coumadin (warfarin), or sometimes aspirin. These medications need close monitoring by your caregiver. Over-medication can cause bleeding. Too little medication may not protect against stroke. HOME CARE INSTRUCTIONS   If your caregiver prescribed medicine to make your heartbeat more normally, take as directed.  If blood thinners were prescribed by your caregiver, take EXACTLY as directed.  Perform blood tests EXACTLY as directed.  Quit smoking. Smoking increases your cardiac and   lung  (pulmonary) risks.  DO NOT drink alcohol.  DO NOT drink caffeinated drinks (e.g. coffee, soda, chocolate, and leaf teas). You may drink decaffeinated coffee, soda or tea.  If you are overweight, you should choose a reduced calorie diet to lose weight. Please see a registered dietitian if you need more information about healthy weight loss. DO NOT USE DIET PILLS as they may aggravate heart problems.  If you have other heart problems that are causing AFib, you may need to eat a low salt, fat, and cholesterol diet. Your caregiver will tell you if this is necessary.  Exercise every day to improve your physical fitness. Stay active unless advised otherwise.  If your caregiver has given you a follow-up appointment, it is very important to keep that appointment. Not keeping the appointment could result in heart failure or stroke. If there is any problem keeping the appointment, you must call back to this facility for assistance. SEEK MEDICAL CARE IF:  You notice a change in the rate, rhythm or strength of your heartbeat.  You develop an infection or any other change in your overall health status. SEEK IMMEDIATE MEDICAL CARE IF:   You develop chest pain, abdominal pain, sweating, weakness or feel sick to your stomach (nausea).  You develop shortness of breath.  You develop swollen feet and ankles.  You develop dizziness, numbness, or weakness of your face or limbs, or any change in vision or speech. MAKE SURE YOU:   Understand these instructions.  Will watch your condition.  Will get help right away if you are not doing well or get worse. Document Released: 08/31/2005 Document Revised: 11/23/2011 Document Reviewed: 04/09/2010 [employer] Patient Information 2014 [employer].  

## 2013-06-03 LAB — NMR, LIPOPROFILE
Cholesterol: 142 mg/dL (ref <200)
HDL Cholesterol by NMR: 42 mg/dL (ref >=40)
HDL Particle Number: 38.8 umol/L (ref >=30.5)
LDL Particle Number: 1290 nmol/L — ABNORMAL HIGH (ref <1000)
LDL Size: 20.8 nm (ref >20.5)
LDLC SERPL CALC-MCNC: 70 mg/dL (ref <100)
LP-IR Score: 74 — ABNORMAL HIGH (ref <=45)
Small LDL Particle Number: 781 nmol/L — ABNORMAL HIGH (ref <=527)
Triglycerides by NMR: 148 mg/dL (ref <150)

## 2013-06-03 LAB — CMP14+EGFR
ALT: 22 IU/L (ref 0–44)
AST: 26 IU/L (ref 0–40)
Albumin/Globulin Ratio: 1.7 (ref 1.1–2.5)
Albumin: 4.5 g/dL (ref 3.6–4.8)
Alkaline Phosphatase: 43 IU/L (ref 39–117)
BUN/Creatinine Ratio: 13 (ref 10–22)
BUN: 21 mg/dL (ref 8–27)
CO2: 27 mmol/L (ref 18–29)
Calcium: 9.8 mg/dL (ref 8.6–10.2)
Chloride: 100 mmol/L (ref 97–108)
Creatinine, Ser: 1.63 mg/dL — ABNORMAL HIGH (ref 0.76–1.27)
GFR calc Af Amer: 50 mL/min/{1.73_m2} — ABNORMAL LOW (ref >59)
GFR calc non Af Amer: 43 mL/min/{1.73_m2} — ABNORMAL LOW (ref >59)
Globulin, Total: 2.6 g/dL (ref 1.5–4.5)
Glucose: 92 mg/dL (ref 65–99)
Potassium: 4.6 mmol/L (ref 3.5–5.2)
Sodium: 142 mmol/L (ref 134–144)
Total Bilirubin: 0.3 mg/dL (ref 0.0–1.2)
Total Protein: 7.1 g/dL (ref 6.0–8.5)

## 2013-06-03 LAB — PSA, TOTAL AND FREE
PSA, Free Pct: >10 %
PSA, Free: 0.01 ng/mL
PSA: <0.1 ng/mL (ref 0.0–4.0)

## 2013-06-08 ENCOUNTER — Telehealth: Payer: Self-pay | Admitting: Nurse Practitioner

## 2013-06-08 NOTE — Telephone Encounter (Signed)
Patient aware.

## 2013-06-09 NOTE — Telephone Encounter (Signed)
Labs faxed

## 2013-06-13 LAB — POCT GLYCOSYLATED HEMOGLOBIN (HGB A1C): Hemoglobin A1C: 5.4

## 2013-06-13 NOTE — Addendum Note (Signed)
Addended by: Lisbeth Ply C on: 06/13/2013 12:51 PM   Modules accepted: Orders

## 2013-06-21 DIAGNOSIS — Z8546 Personal history of malignant neoplasm of prostate: Secondary | ICD-10-CM | POA: Diagnosis not present

## 2013-06-21 DIAGNOSIS — R6882 Decreased libido: Secondary | ICD-10-CM | POA: Diagnosis not present

## 2013-06-21 DIAGNOSIS — N529 Male erectile dysfunction, unspecified: Secondary | ICD-10-CM | POA: Diagnosis not present

## 2013-06-27 ENCOUNTER — Ambulatory Visit (INDEPENDENT_AMBULATORY_CARE_PROVIDER_SITE_OTHER): Payer: Medicare Other | Admitting: *Deleted

## 2013-06-27 DIAGNOSIS — Z23 Encounter for immunization: Secondary | ICD-10-CM | POA: Diagnosis not present

## 2013-07-03 ENCOUNTER — Other Ambulatory Visit: Payer: Self-pay

## 2013-07-07 ENCOUNTER — Encounter: Payer: Self-pay | Admitting: Cardiology

## 2013-07-07 ENCOUNTER — Ambulatory Visit (INDEPENDENT_AMBULATORY_CARE_PROVIDER_SITE_OTHER): Payer: Medicare Other | Admitting: Cardiology

## 2013-07-07 VITALS — BP 150/80 | HR 61 | Ht 74.0 in | Wt 255.0 lb

## 2013-07-07 DIAGNOSIS — I4891 Unspecified atrial fibrillation: Secondary | ICD-10-CM

## 2013-07-07 DIAGNOSIS — R079 Chest pain, unspecified: Secondary | ICD-10-CM | POA: Diagnosis not present

## 2013-07-07 MED ORDER — AMLODIPINE BESYLATE 2.5 MG PO TABS: 2.5000 mg | ORAL_TABLET | Freq: Every day | ORAL | Status: DC

## 2013-07-07 NOTE — Patient Instructions (Addendum)
Your physician has requested that you have an echocardiogram. Echocardiography is a painless test that uses sound waves to create images of your heart. It provides your doctor with information about the size and shape of your heart and how well your heart's chambers and valves are working. This procedure takes approximately one hour. There are no restrictions for this procedure.  Your physician has recommended that you wear an 14 day event monitor. Event monitors are medical devices that record the heart's electrical activity. Doctors most often [place] these monitors to diagnose arrhythmias. Arrhythmias are problems with the speed or rhythm of the heartbeat. The monitor is a small, portable device. You can wear one while you do your normal daily activities. This is usually used to diagnose what is causing palpitations/syncope (passing out).  Your physician has requested that you have en exercise stress myoview. For further information please visit [email or web address] Please follow instruction sheet, as given.  Your physician recommends that you return for lab work in: today  Your physician recommends that you schedule a follow-up appointment in: after tests  Please call Larita Fife at [phone] in about 2 weeks to order Amlodipine through mail order pharmacy

## 2013-07-07 NOTE — Progress Notes (Signed)
Patient ID: TEOFILO LUPINACCI, male   DOB: [birth year], 67 y.o.   MRN: [id number]    Patient Name: [name] [name] Date of Encounter: 07/07/2013  Primary Care Provider:  Rudi Heap, MD Primary Cardiologist:  Tobias Alexander, H   Patient Profile  Atrial fibrillation  Problem List   No past medical history on file. No past surgical history on file.  Allergies  No Known Allergies  HPI  67 year old male with a history of COPD, hyperlipidemia, prior history of prostate cancer now in remission who was referred to [place] for a finding of atrial fibrillation but his primary care physician. The patient was previously very active is about 10 years ago he was running to 3 miles daily, afterwards he started to have joint problems and had multiple surgeries that led to significant deconditioning. The patient is stating that for at least the last year he felt significant shortness of breath, he walks every day 20 minutes to 1 hour any incline makes him gasp for breath. He denies any palpitations. The patient is complaining of dizzy spells that has been increasing in frequency and he had one episode of near-syncope, after he was walking his dog. The patient is not aware of palpitations at that time. The patient isn't denies symptoms of orthopnea, paroxysmal nocturnal dyspnea, lower extremity edema.  Home Medications  Prior to Admission medications   Medication Sig Start Date End Date Taking? Authorizing Provider  budesonide-formoterol (SYMBICORT) 160-4.5 MCG/ACT inhaler Inhale 2 puffs into the lungs 2 (two) times daily. 06/02/13   Mary-Margaret Daphine Deutscher, FNP  citalopram (CELEXA) 20 MG tablet Take 1 tablet (20 mg total) by mouth daily. 06/02/13   Mary-Margaret Daphine Deutscher, FNP  fenofibrate 160 MG tablet Take 1 tablet (160 mg total) by mouth daily. 06/02/13   Mary-Margaret Daphine Deutscher, FNP  montelukast (SINGULAIR) 10 MG tablet Take 1 tablet (10 mg total) by mouth at bedtime. 06/02/13   Mary-Margaret Daphine Deutscher, FNP    Naproxen Sodium (NAPRELAN) 750 MG TB24 Take 750 mg by mouth daily.    Historical Provider, MD  simvastatin (ZOCOR) 40 MG tablet Take 1 tablet (40 mg total) by mouth at bedtime. 06/02/13   Mary-Margaret Daphine Deutscher, FNP    Family History  No family history on file.  Social History  History   Social History  . Marital Status: Married    Spouse Name: N/A    Number of Children: N/A  . Years of Education: N/A   Occupational History  . Not on file.   Social History Main Topics  . Smoking status: Former Smoker -- 1.00 packs/day for 20 years    Types: Cigarettes    Quit date: 01/19/1996  . Smokeless tobacco: Never Used  . Alcohol Use: Not on file  . Drug Use: Not on file  . Sexual Activity: Not on file   Other Topics Concern  . Not on file   Social History Narrative  . No narrative on file     Review of Systems, as per history of present illness otherwise negative General:  No chills, fever, night sweats or weight changes.  Cardiovascular:  No chest pain, dyspnea on exertion, edema, orthopnea, palpitations, paroxysmal nocturnal dyspnea. Dermatological: No rash, lesions/masses Respiratory: No cough, dyspnea Urologic: No hematuria, dysuria Abdominal:   No nausea, vomiting, diarrhea, bright red blood per rectum, melena, or hematemesis Neurologic:  No visual changes, wkns, changes in mental status. All other systems reviewed and are otherwise negative except as noted above.  Physical Exam  BP 150/80, HR  70 BPM General: Pleasant, NAD Psych: Normal affect. Neuro: Alert and oriented X 3. Moves all extremities spontaneously. HEENT: Normal  Neck: Supple without bruits or JVD. Lungs:  Resp regular and unlabored, CTA. Heart: RRR no s3, s4, or murmurs. Abdomen: Soft, non-tender, non-distended, BS + x 4.  Extremities: No clubbing, cyanosis or edema. DP/PT/Radials 2+ and equal bilaterally.  Accessory Clinical Findings  ECG - sinus rhythm 61 beats per minute, normal  EKG  Assessment & Plan  67 year old male  1. New diagnosis of paroxysmal atrial fibrillation - frequent episodes of dizziness, one episode of near-syncope, he'll start patient on cardiac two-week long e-cardio monitor to evaluate for episodes of atrial fibrillation. Also order an echocardiogram to evaluate left atrial size, left ventricular systolic and diastolic function. Check TSH  2. Hypertension - we will start amlodipine 2.5 mg daily  3. Lipid profile - the patient states that he does check his primary care physician office and it was within normal limits, currently on simvastatin 40 mg daily  4. CKD stage 3 - possibly due to naproxen the patient is using for his chronic back pain, he'll try to discontinue it recheck creatinine at his next visit  Followup in 6 weeks    Tobias Alexander, Rexene Edison, MD 07/07/2013, 11:02 AM

## 2013-07-14 ENCOUNTER — Telehealth: Payer: Self-pay | Admitting: *Deleted

## 2013-07-14 MED ORDER — FLUTICASONE-SALMETEROL 100-50 MCG/DOSE IN AEPB: 1.0000 | INHALATION_SPRAY | Freq: Two times a day (BID) | RESPIRATORY_TRACT | Status: DC

## 2013-07-14 NOTE — Telephone Encounter (Signed)
ymbicort changed to advair and rx sent to pharmacy

## 2013-07-14 NOTE — Telephone Encounter (Signed)
Ins co will not cover symbicort until he has tried advair diskus and failed, can you take care of this please? Thanks

## 2013-07-24 ENCOUNTER — Telehealth: Payer: Self-pay | Admitting: Nurse Practitioner

## 2013-07-24 ENCOUNTER — Other Ambulatory Visit: Payer: Self-pay

## 2013-07-24 ENCOUNTER — Other Ambulatory Visit: Payer: Self-pay | Admitting: Cardiology

## 2013-07-24 MED ORDER — AMLODIPINE BESYLATE 2.5 MG PO TABS: 2.5000 mg | ORAL_TABLET | Freq: Every day | ORAL | Status: DC

## 2013-07-25 MED ORDER — FLUTICASONE-SALMETEROL 100-50 MCG/DOSE IN AEPB: 1.0000 | INHALATION_SPRAY | Freq: Two times a day (BID) | RESPIRATORY_TRACT | Status: DC

## 2013-07-25 NOTE — Telephone Encounter (Signed)
[facility]

## 2013-07-25 NOTE — Telephone Encounter (Signed)
Patient aware of [facility].

## 2013-07-28 ENCOUNTER — Ambulatory Visit (HOSPITAL_COMMUNITY): Payer: Medicare Other | Attending: Cardiology | Admitting: Radiology

## 2013-07-28 ENCOUNTER — Encounter: Payer: Self-pay | Admitting: Cardiology

## 2013-07-28 VITALS — BP 131/69 | HR 64 | Ht 74.5 in | Wt 250.0 lb

## 2013-07-28 DIAGNOSIS — R55 Syncope and collapse: Secondary | ICD-10-CM | POA: Diagnosis not present

## 2013-07-28 DIAGNOSIS — J4489 Other specified chronic obstructive pulmonary disease: Secondary | ICD-10-CM | POA: Insufficient documentation

## 2013-07-28 DIAGNOSIS — I4891 Unspecified atrial fibrillation: Secondary | ICD-10-CM | POA: Insufficient documentation

## 2013-07-28 DIAGNOSIS — R079 Chest pain, unspecified: Secondary | ICD-10-CM

## 2013-07-28 DIAGNOSIS — J449 Chronic obstructive pulmonary disease, unspecified: Secondary | ICD-10-CM | POA: Diagnosis not present

## 2013-07-28 DIAGNOSIS — R0609 Other forms of dyspnea: Secondary | ICD-10-CM | POA: Insufficient documentation

## 2013-07-28 DIAGNOSIS — Z87891 Personal history of nicotine dependence: Secondary | ICD-10-CM | POA: Diagnosis not present

## 2013-07-28 DIAGNOSIS — R42 Dizziness and giddiness: Secondary | ICD-10-CM | POA: Diagnosis not present

## 2013-07-28 DIAGNOSIS — R0989 Other specified symptoms and signs involving the circulatory and respiratory systems: Secondary | ICD-10-CM | POA: Insufficient documentation

## 2013-07-28 DIAGNOSIS — R9431 Abnormal electrocardiogram [ECG] [EKG]: Secondary | ICD-10-CM | POA: Insufficient documentation

## 2013-07-28 DIAGNOSIS — E785 Hyperlipidemia, unspecified: Secondary | ICD-10-CM | POA: Diagnosis not present

## 2013-07-28 DIAGNOSIS — R0602 Shortness of breath: Secondary | ICD-10-CM | POA: Diagnosis not present

## 2013-07-28 DIAGNOSIS — I10 Essential (primary) hypertension: Secondary | ICD-10-CM | POA: Insufficient documentation

## 2013-07-28 DIAGNOSIS — Z8249 Family history of ischemic heart disease and other diseases of the circulatory system: Secondary | ICD-10-CM | POA: Insufficient documentation

## 2013-07-28 MED ORDER — TECHNETIUM TC 99M SESTAMIBI GENERIC - CARDIOLITE
33.0000 | Freq: Once | INTRAVENOUS | Status: AC | PRN
  Administered 2013-07-28: 33 via INTRAVENOUS

## 2013-07-28 MED ORDER — TECHNETIUM TC 99M SESTAMIBI GENERIC - CARDIOLITE
11.0000 | Freq: Once | INTRAVENOUS | Status: AC | PRN
  Administered 2013-07-28: 11 via INTRAVENOUS

## 2013-07-28 NOTE — Progress Notes (Signed)
[facility] SITE 3 NUCLEAR MED [address] [place], [place] [address] [phone]    Cardiology Nuclear Med Study  [name] [name] is a 67 y.o. male     MRN : [id number]     DOB: 12/11/1945  Procedure Date: 07/28/2013  Nuclear Med Background Indication for Stress Test:  Evaluation for Ischemia and Abnormal EKG (new AFIB) History:  Afib, Asthma, COPD, Echo 2008 45-50%, Heart Cath (normal Cardiac Risk Factors: Family History - CAD, History of Smoking, Hypertension and Lipids  Symptoms:  Dizziness, DOE and Near Syncope   Nuclear Pre-Procedure Caffeine/Decaff Intake:  None > 12 hrs NPO After: 9:00pm   Lungs:  clear O2 Sat: 96% on room air. IV 0.9% NS with Angio Cath:  20g  IV Site: R Antecubital x 1, tolerated well IV Started by:  Irean Hong, RN  Chest Size (in):  50 Cup Size: n/a  Height: 6' 2.5" (1.892 m)  Weight:  250 lb (113.399 kg)  BMI:  Body mass index is 31.68 kg/(m^2). Tech Comments:  N/A    Nuclear Med Study 1 or 2 day study: 1 day  Stress Test Type:  Stress  Reading MD: Marca Ancona, MD  Order Authorizing Provider:  Tobias Alexander, MD  Resting Radionuclide: Technetium 49m Sestamibi  Resting Radionuclide Dose: 11.0 mCi   Stress Radionuclide:  Technetium 67m Sestamibi  Stress Radionuclide Dose: 33.0 mCi           Stress Protocol Rest HR: 64 Stress HR: 137  Rest BP: 131/69 Stress BP: 195/54  Exercise Time (min): 7:15 METS: 8.9   Predicted Max HR: 154 bpm % Max HR: 88.96 bpm Rate Pressure Product: 21308   Dose of Adenosine (mg):  n/a Dose of Lexiscan: n/a mg  Dose of Atropine (mg): n/a Dose of Dobutamine: n/a mcg/kg/min (at max HR)  Stress Test Technologist: Nelson Chimes, BS-ES  Nuclear Technologist:  Dario Guardian, CNMT     Rest Procedure:  Myocardial perfusion imaging was performed at rest 45 minutes following the intravenous administration of Technetium 37m Sestamibi. Rest ECG: NSR - Normal EKG  Stress Procedure:  The patient exercised  on the treadmill utilizing the Bruce Protocol for 7:15 minutes. The patient stopped due to fatigue and denied any chest pain.  Technetium 44m Sestamibi was injected at peak exercise and myocardial perfusion imaging was performed after a brief delay.  Recovery uneventful. Stress ECG: No significant change from baseline ECG  QPS Raw Data Images:  Normal; no motion artifact; normal heart/lung ratio. Stress Images:  Small, moderate-intensity apical perfusion defect and small, mild basal inferolateral perfusion defect.  Rest Images:  Small, moderate-intensity apical perfusion defect and small, mild basal inferolateral perfusion defect. Subtraction (SDS):  Fixed small, moderate-intensity apical perfusion defect and small, mild basal inferolateral perfusion defect. Transient Ischemic Dilatation (Normal <1.22):  0.96 Lung/Heart Ratio (Normal <0.45):  0.34  Quantitative Gated Spect Images QGS EDV:  128 ml QGS ESV:  58 ml  Impression Exercise Capacity:  Fair exercise capacity. BP Response:  Hypertensive blood pressure response. Clinical Symptoms:  Fatigue ECG Impression:  No significant ST segment change suggestive of ischemia. Comparison with Prior Nuclear Study: No images to compare  Overall Impression:  Low risk stress nuclear study with a fixed small, moderate-intensity apical perfusion defect and a fixed, mild basal inferolateral perfusion defect.  Given normal wall motion, suspect soft tissue attenuation.  Less likely prior MI.  No ischemia noted. .  LV Ejection Fraction: 55%.  LV Wall Motion:  NL LV Function; NL Wall Motion  Marca Ancona 07/28/2013

## 2013-07-31 ENCOUNTER — Ambulatory Visit (HOSPITAL_COMMUNITY): Payer: Medicare Other | Attending: Cardiology | Admitting: Radiology

## 2013-07-31 ENCOUNTER — Encounter: Payer: Self-pay | Admitting: Cardiology

## 2013-07-31 ENCOUNTER — Encounter (INDEPENDENT_AMBULATORY_CARE_PROVIDER_SITE_OTHER): Payer: Medicare Other

## 2013-07-31 ENCOUNTER — Encounter: Payer: Self-pay | Admitting: *Deleted

## 2013-07-31 DIAGNOSIS — R079 Chest pain, unspecified: Secondary | ICD-10-CM

## 2013-07-31 DIAGNOSIS — Z87891 Personal history of nicotine dependence: Secondary | ICD-10-CM | POA: Insufficient documentation

## 2013-07-31 DIAGNOSIS — I4891 Unspecified atrial fibrillation: Secondary | ICD-10-CM

## 2013-07-31 DIAGNOSIS — E785 Hyperlipidemia, unspecified: Secondary | ICD-10-CM | POA: Diagnosis not present

## 2013-07-31 NOTE — Progress Notes (Signed)
Patient ID: [name] [name], male   DOB: 17-Apr-1946, 67 y.o.   MRN: [id number] E-Cardio verite 14 day cardiac event monitor applied to patient.

## 2013-07-31 NOTE — Progress Notes (Signed)
Echocardiogram performed.  

## 2013-08-09 ENCOUNTER — Encounter: Payer: Self-pay | Admitting: *Deleted

## 2013-08-14 ENCOUNTER — Encounter: Payer: Self-pay | Admitting: Cardiology

## 2013-08-14 ENCOUNTER — Ambulatory Visit (INDEPENDENT_AMBULATORY_CARE_PROVIDER_SITE_OTHER): Payer: Medicare Other | Admitting: Cardiology

## 2013-08-14 VITALS — BP 142/70 | HR 76 | Ht 75.0 in | Wt 257.0 lb

## 2013-08-14 DIAGNOSIS — R0609 Other forms of dyspnea: Secondary | ICD-10-CM | POA: Diagnosis not present

## 2013-08-14 DIAGNOSIS — R0989 Other specified symptoms and signs involving the circulatory and respiratory systems: Secondary | ICD-10-CM

## 2013-08-14 LAB — COMPREHENSIVE METABOLIC PANEL
ALT: 21 U/L (ref 0–53)
AST: 24 U/L (ref 0–37)
Albumin: 4.5 g/dL (ref 3.5–5.2)
Alkaline Phosphatase: 43 U/L (ref 39–117)
BUN: 22 mg/dL (ref 6–23)
CO2: 29 mEq/L (ref 19–32)
Calcium: 10 mg/dL (ref 8.4–10.5)
Chloride: 100 mEq/L (ref 96–112)
Creatinine, Ser: 1.5 mg/dL (ref 0.4–1.5)
GFR: 48.87 mL/min — ABNORMAL LOW (ref >60.00)
Glucose, Bld: 84 mg/dL (ref 70–99)
Potassium: 4.7 mEq/L (ref 3.5–5.1)
Sodium: 138 mEq/L (ref 135–145)
Total Bilirubin: 0.6 mg/dL (ref 0.3–1.2)
Total Protein: 7.5 g/dL (ref 6.0–8.3)

## 2013-08-14 LAB — TSH: TSH: 1.92 u[IU]/mL (ref 0.35–5.50)

## 2013-08-14 MED ORDER — AMLODIPINE BESYLATE 5 MG PO TABS: 5.0000 mg | ORAL_TABLET | Freq: Every day | ORAL | Status: DC

## 2013-08-14 NOTE — Patient Instructions (Signed)
Your physician has recommended you make the following change in your medication: increase Amlodipine to 5 mg daily  Your physician recommends that you return for lab work in: today  Your physician wants you to follow-up in: 6 months. You will receive a reminder letter in the mail two months in advance. If you don't receive a letter, please call our office to schedule the follow-up appointment.    

## 2013-08-14 NOTE — Progress Notes (Signed)
Patient ID: [name] [name], male   DOB: Apr 12, 1946, 67 y.o.   MRN: [id number]  Patient Name: [name] [name] Date of Encounter: 08/14/2013  Primary Care Provider:  Rudi Heap, MD Primary Cardiologist:  Tobias Alexander, H   Patient Profile  Atrial fibrillation  Problem List   Past Medical History  Diagnosis Date  . GERD (gastroesophageal reflux disease)   . Primary malignant neoplasm of prostate identified by needle biopsy (T1c)   . Hyperlipemia 06/02/2013  . Asthma, chronic 06/02/2013  . Depression 06/02/2013  . DYSPNEA ON EXERTION 06/04/2009    Qualifier: Diagnosis of  By: Diona Browner, MD, [facility]   . LEG PAIN, BILATERAL 06/04/2009    Qualifier: Diagnosis of  By: Diona Browner, MD, [facility]   . PERCUTANEOUS TRANSLUMINAL CORONARY ANGIOPLASTY, HX OF 06/04/2009    Qualifier: Diagnosis of  By: Diona Browner, MD, [facility], Kirke Corin    Past Surgical History  Procedure Laterality Date  . Hiatal hernia repair    . Laparoscopic gastric banding  2000    [facility]   . Cardiac catheterization  1997  . Robot assisted laparoscopic radical prostatectomy      Allergies  No Known Allergies  HPI  67 year old male with a history of COPD, hyperlipidemia, prior history of prostate cancer now in remission who was referred to [place] for a finding of atrial fibrillation but his primary care physician. The patient was previously very active is about 10 years ago he was running to 3 miles daily, afterwards he started to have joint problems and had multiple surgeries that led to significant deconditioning. The patient is stating that for at least the last year he felt significant shortness of breath, he walks every day 20 minutes to 1 hour any incline makes him gasp for breath. He denies any palpitations. The patient is complaining of dizzy spells that has been increasing in frequency and he had one episode of near-syncope, after he was walking his dog. The patient is not aware of palpitations  at that time. The patient isn't denies symptoms of orthopnea, paroxysmal nocturnal dyspnea, lower extremity edema.  Six week follow up, no palpitations, didn't tolerate e cardio well, only wore it for 8 days.  Home Medications  Prior to Admission medications   Medication Sig Start Date End Date Taking? Authorizing Provider  budesonide-formoterol (SYMBICORT) 160-4.5 MCG/ACT inhaler Inhale 2 puffs into the lungs 2 (two) times daily. 06/02/13   Mary-Margaret Daphine Deutscher, FNP  citalopram (CELEXA) 20 MG tablet Take 1 tablet (20 mg total) by mouth daily. 06/02/13   Mary-Margaret Daphine Deutscher, FNP  fenofibrate 160 MG tablet Take 1 tablet (160 mg total) by mouth daily. 06/02/13   Mary-Margaret Daphine Deutscher, FNP  montelukast (SINGULAIR) 10 MG tablet Take 1 tablet (10 mg total) by mouth at bedtime. 06/02/13   Mary-Margaret Daphine Deutscher, FNP  Naproxen Sodium (NAPRELAN) 750 MG TB24 Take 750 mg by mouth daily.    Historical Provider, MD  simvastatin (ZOCOR) 40 MG tablet Take 1 tablet (40 mg total) by mouth at bedtime. 06/02/13   Mary-Margaret Daphine Deutscher, FNP    Family History  Family History  Problem Relation Age of Onset  . Heart disease      Early Cardiac disease for uncle is their 52's as well as deceased.    Social History  History   Social History  . Marital Status: Married    Spouse Name: N/A    Number of Children: N/A  . Years of Education: N/A   Occupational History  .  Not on file.   Social History Main Topics  . Smoking status: Former Smoker -- 1.00 packs/day for 20 years    Types: Cigarettes    Quit date: 01/19/1996  . Smokeless tobacco: Never Used  . Alcohol Use: Not on file  . Drug Use: Not on file  . Sexual Activity: Not on file   Other Topics Concern  . Not on file   Social History Narrative  . No narrative on file     Review of Systems, as per history of present illness otherwise negative General:  No chills, fever, night sweats or weight changes.  Cardiovascular:  No chest pain, dyspnea on  exertion, edema, orthopnea, palpitations, paroxysmal nocturnal dyspnea. Dermatological: No rash, lesions/masses Respiratory: No cough, dyspnea Urologic: No hematuria, dysuria Abdominal:   No nausea, vomiting, diarrhea, bright red blood per rectum, melena, or hematemesis Neurologic:  No visual changes, wkns, changes in mental status. All other systems reviewed and are otherwise negative except as noted above.  Physical Exam  BP 142/70, HR 70 BPM General: Pleasant, NAD Psych: Normal affect. Neuro: Alert and oriented X 3. Moves all extremities spontaneously. HEENT: Normal  Neck: Supple without bruits or JVD. Lungs:  Resp regular and unlabored, CTA. Heart: RRR no s3, s4, or murmurs. Abdomen: Soft, non-tender, non-distended, BS + x 4.  Extremities: No clubbing, cyanosis or edema. DP/PT/Radials 2+ and equal bilaterally.  Accessory Clinical Findings  ECG - sinus rhythm 61 beats per minute, normal EKG   TTE:  Left ventricle: The cavity size was normal. Wall thickness was increased in a pattern of moderate LVH. Systolic function was normal. The estimated ejection fraction was in the range of 50% to 55%. Wall motion was normal; there were no regional wall motion abnormalities. Doppler parameters are consistent with abnormal left ventricular relaxation (grade 1 diastolic dysfunction).  ------------------------------------------------------------ Aortic valve: Trileaflet; normal thickness leaflets. Mobility was not restricted. Doppler: Transvalvular velocity was within the normal range. There was no stenosis. No regurgitation.  ------------------------------------------------------------ Aorta: Aortic root: The aortic root was normal in size.  ------------------------------------------------------------ Mitral valve: Structurally normal valve. Mobility was not restricted. Doppler: Transvalvular velocity was within the normal range. There was no evidence for stenosis.  No regurgitation. Peak gradient: 3mm Hg (D).  ------------------------------------------------------------ Left atrium: The atrium was mildly dilated.  ------------------------------------------------------------ Right ventricle: The cavity size was normal. Systolic function was normal.  ------------------------------------------------------------ Pulmonic valve: Doppler: Transvalvular velocity was within the normal range. There was no evidence for stenosis.  ------------------------------------------------------------ Tricuspid valve: Structurally normal valve. Doppler: Transvalvular velocity was within the normal range. No regurgitation.  ------------------------------------------------------------ Right atrium: The atrium was normal in size.  ------------------------------------------------------------ Pericardium: There was no pericardial effusion.  ------------------------------------------------------------ Systemic veins: Inferior vena cava: The vessel was normal in size.   Assessment & Plan  67 year old male  1. New diagnosis of paroxysmal atrial fibrillation - frequent episodes of dizziness, one episode of near-syncope. The patient just returned e cardio monitor this morning so we don't have the results yet. No palpitations meanwhile. He only wore it for 7-8 days as he didn't tolerate it well. Echocardiogram shows mild LA enlargement. We will start him on aspirin 81 mg po daily for now. Check TSH today.  We will call with the results.  2. Hypertension - echo shows moderate LVH, BO still uncontrolled, we will increase amlodipine to 5 mg po daily. The patient is advised to continue exercising but to stop weightlifting.  3. Lipid profile - the patient states that he does check  his primary care physician office and it was within normal limits, currently on simvastatin 40 mg daily  4. CKD stage 3 - possibly due to naproxen the patient is using for his chronic back pain,  he decreased to dose to half, we will check Crea today  Followup in 6 months unless there is a -fib on e cardio monitor.   Tobias Alexander, Rexene Edison, MD 08/14/2013, 9:24 AM

## 2013-08-17 ENCOUNTER — Telehealth: Payer: Self-pay

## 2013-08-17 NOTE — Telephone Encounter (Signed)
**Note De-Identified [name] [name] Obfuscation** The pt is advised per Dr Delton See that his ecardio event mon results are normal, he verbalized understanding.

## 2013-10-12 ENCOUNTER — Other Ambulatory Visit: Payer: Self-pay | Admitting: Nurse Practitioner

## 2013-11-25 ENCOUNTER — Other Ambulatory Visit: Payer: Self-pay | Admitting: Nurse Practitioner

## 2013-12-11 ENCOUNTER — Other Ambulatory Visit: Payer: Self-pay | Admitting: Nurse Practitioner

## 2013-12-12 NOTE — Telephone Encounter (Signed)
Last ov and labs 06/02/13. Last refill on Zocor and Fenofibrate from [facility] showed 01/11/13?

## 2013-12-13 NOTE — Telephone Encounter (Signed)
Patient NTBS for follow up and lab work  

## 2013-12-14 DIAGNOSIS — D492 Neoplasm of unspecified behavior of bone, soft tissue, and skin: Secondary | ICD-10-CM | POA: Diagnosis not present

## 2013-12-14 DIAGNOSIS — L819 Disorder of pigmentation, unspecified: Secondary | ICD-10-CM | POA: Diagnosis not present

## 2013-12-14 DIAGNOSIS — L578 Other skin changes due to chronic exposure to nonionizing radiation: Secondary | ICD-10-CM | POA: Diagnosis not present

## 2013-12-14 DIAGNOSIS — L820 Inflamed seborrheic keratosis: Secondary | ICD-10-CM | POA: Diagnosis not present

## 2013-12-14 DIAGNOSIS — L570 Actinic keratosis: Secondary | ICD-10-CM | POA: Diagnosis not present

## 2013-12-14 DIAGNOSIS — C44319 Basal cell carcinoma of skin of other parts of face: Secondary | ICD-10-CM | POA: Diagnosis not present

## 2013-12-28 DIAGNOSIS — C44319 Basal cell carcinoma of skin of other parts of face: Secondary | ICD-10-CM | POA: Diagnosis not present

## 2014-01-10 ENCOUNTER — Other Ambulatory Visit: Payer: Self-pay

## 2014-01-10 MED ORDER — AMLODIPINE BESYLATE 5 MG PO TABS: 5.0000 mg | ORAL_TABLET | Freq: Every day | ORAL | Status: DC

## 2014-01-11 ENCOUNTER — Ambulatory Visit (INDEPENDENT_AMBULATORY_CARE_PROVIDER_SITE_OTHER): Payer: Medicare Other | Admitting: Nurse Practitioner

## 2014-01-11 ENCOUNTER — Encounter: Payer: Self-pay | Admitting: Nurse Practitioner

## 2014-01-11 VITALS — BP 139/75 | HR 81 | Temp 99.1°F | Ht 76.0 in | Wt 272.0 lb

## 2014-01-11 DIAGNOSIS — E785 Hyperlipidemia, unspecified: Secondary | ICD-10-CM | POA: Diagnosis not present

## 2014-01-11 DIAGNOSIS — K219 Gastro-esophageal reflux disease without esophagitis: Secondary | ICD-10-CM | POA: Diagnosis not present

## 2014-01-11 DIAGNOSIS — F329 Major depressive disorder, single episode, unspecified: Secondary | ICD-10-CM

## 2014-01-11 DIAGNOSIS — Z8546 Personal history of malignant neoplasm of prostate: Secondary | ICD-10-CM

## 2014-01-11 DIAGNOSIS — J45909 Unspecified asthma, uncomplicated: Secondary | ICD-10-CM | POA: Diagnosis not present

## 2014-01-11 DIAGNOSIS — F32A Depression, unspecified: Secondary | ICD-10-CM

## 2014-01-11 DIAGNOSIS — F3289 Other specified depressive episodes: Secondary | ICD-10-CM

## 2014-01-11 MED ORDER — CITALOPRAM HYDROBROMIDE 20 MG PO TABS: 20.0000 mg | ORAL_TABLET | Freq: Every day | ORAL | Status: DC

## 2014-01-11 MED ORDER — MONTELUKAST SODIUM 10 MG PO TABS: 10.0000 mg | ORAL_TABLET | Freq: Every day | ORAL | Status: DC

## 2014-01-11 MED ORDER — SIMVASTATIN 40 MG PO TABS: ORAL_TABLET | ORAL | Status: DC

## 2014-01-11 MED ORDER — FENOFIBRATE 160 MG PO TABS: 160.0000 mg | ORAL_TABLET | Freq: Every day | ORAL | Status: DC

## 2014-01-11 NOTE — Patient Instructions (Signed)

## 2014-01-11 NOTE — Progress Notes (Signed)
Subjective:    Patient ID: [name] [name], male    DOB: September 10, 1946, 68 y.o.   MRN: [id number]  Patient here today for follow up of chronic medical problems.  Hyperlipidemia This is a chronic problem. The current episode started more than 1 year ago. The problem is uncontrolled. Recent lipid tests were reviewed and are high. Exacerbating diseases include obesity. He has no history of diabetes. Pertinent negatives include no chest pain, leg pain, myalgias or shortness of breath. Current antihyperlipidemic treatment includes statins. The current treatment provides moderate improvement of lipids. Compliance problems include adherence to diet.  Risk factors for coronary artery disease include family history, male sex and obesity.  Hypertension This is a chronic problem. The current episode started more than 1 year ago. The problem has been resolved since onset. The problem is controlled. Pertinent negatives include no blurred vision, chest pain, headaches, palpitations, peripheral edema or shortness of breath. Past treatments include calcium channel blockers. The current treatment provides moderate improvement.  Depression Pt takes Celexa 20 mg-Well controlled Asthma  Pt states his asthma is controlled- Pt states he uses his rescue inhaler 3-4 times a month-when he works outside- Also takes singulair 10 mg daily   Review of Systems  Eyes: Negative for blurred vision.  Respiratory: Negative for shortness of breath.   Cardiovascular: Negative for chest pain and palpitations.  Musculoskeletal: Negative for myalgias.  Neurological: Positive for dizziness and light-headedness. Negative for headaches.  All other systems reviewed and are negative.      Objective:   Physical Exam  Vitals reviewed. Constitutional: He is oriented to person, place, and time. He appears well-developed and well-nourished.  HENT:  Head: Normocephalic and atraumatic.  Right Ear: External ear normal.  Left Ear: External  ear normal.  Nose: Nose normal.  Mouth/Throat: Oropharynx is clear and moist.  Eyes: EOM are normal. Pupils are equal, round, and reactive to light.  Neck: Normal range of motion. Neck supple. No thyromegaly present.  Cardiovascular: Normal rate, regular rhythm, normal heart sounds and intact distal pulses.   Pulmonary/Chest: Effort normal and breath sounds normal. No respiratory distress. He has no wheezes.  Abdominal: Soft. Bowel sounds are normal.  Musculoskeletal: Normal range of motion. He exhibits no edema and no tenderness.  Pt has peripheral numbness present in bilateral feet  Neurological: He is alert and oriented to person, place, and time.  Skin: Skin is warm and dry.  Psychiatric: He has a normal mood and affect. His behavior is normal. Judgment and thought content normal.     BP 139/75  Pulse 81  Temp(Src) 99.1 F (37.3 C) (Oral)  Ht 6' 4"  (1.93 m)  Wt 272 lb (123.378 kg)  BMI 33.12 kg/m2       Assessment & Plan:   1. GERD   2. Depression   3. Asthma, chronic   4. Hyperlipidemia LDL goal < 100   5. Prostate cancer screening    Orders Placed This Encounter  Procedures  . CMP14+EGFR  . NMR, lipoprofile  . PSA, total and free    Meds ordered this encounter  Medications  . DISCONTD: fenofibrate 160 MG tablet    Sig: Take 1 tablet (160 mg total) by mouth daily.    Dispense:  30 tablet    Refill:  1    Order Specific Question:  Supervising Provider    Answer:  Chipper Herb [[id number]]  . DISCONTD: simvastatin (ZOCOR) 40 MG tablet    Sig: TAKE 1 TABLET DAILY  Dispense:  30 tablet    Refill:  1    Order Specific Question:  Supervising Provider    Answer:  Chipper Herb [[id number]]  . DISCONTD: citalopram (CELEXA) 20 MG tablet    Sig: Take 1 tablet (20 mg total) by mouth daily.    Dispense:  30 tablet    Refill:  1    NTBS    Order Specific Question:  Supervising Provider    Answer:  Chipper Herb [[id number]]  . montelukast (SINGULAIR) 10 MG tablet     Sig: Take 1 tablet (10 mg total) by mouth at bedtime.    Dispense:  90 tablet    Refill:  1    Order Specific Question:  Supervising Provider    Answer:  Chipper Herb [[id number]]  . citalopram (CELEXA) 20 MG tablet    Sig: Take 1 tablet (20 mg total) by mouth daily.    Dispense:  90 tablet    Refill:  1    Order Specific Question:  Supervising Provider    Answer:  Chipper Herb [[id number]]  . fenofibrate 160 MG tablet    Sig: Take 1 tablet (160 mg total) by mouth daily.    Dispense:  90 tablet    Refill:  1    Order Specific Question:  Supervising Provider    Answer:  Chipper Herb [[id number]]  . simvastatin (ZOCOR) 40 MG tablet    Sig: TAKE 1 TABLET DAILY    Dispense:  90 tablet    Refill:  1    Order Specific Question:  Supervising Provider    Answer:  Chipper Herb [[id number]]    Labs pending Health maintenance reviewed Diet and exercise encouraged Continue all meds Follow up  In 3 months   [place], FNP

## 2014-01-12 LAB — NMR, LIPOPROFILE
Cholesterol: 161 mg/dL (ref <200)
HDL Cholesterol by NMR: 44 mg/dL (ref >=40)
HDL Particle Number: 39.6 umol/L (ref >=30.5)
LDL Particle Number: 1138 nmol/L — ABNORMAL HIGH (ref <1000)
LDL Size: 21 nm (ref >20.5)
LDLC SERPL CALC-MCNC: 81 mg/dL (ref <100)
LP-IR Score: 87 — ABNORMAL HIGH (ref <=45)
Small LDL Particle Number: 609 nmol/L — ABNORMAL HIGH (ref <=527)
Triglycerides by NMR: 182 mg/dL — ABNORMAL HIGH (ref <150)

## 2014-01-12 LAB — CMP14+EGFR
ALT: 24 IU/L (ref 0–44)
AST: 26 IU/L (ref 0–40)
Albumin/Globulin Ratio: 1.9 (ref 1.1–2.5)
Albumin: 4.8 g/dL (ref 3.6–4.8)
Alkaline Phosphatase: 55 IU/L (ref 39–117)
BUN/Creatinine Ratio: 12 (ref 10–22)
BUN: 18 mg/dL (ref 8–27)
CO2: 26 mmol/L (ref 18–29)
Calcium: 10.6 mg/dL — ABNORMAL HIGH (ref 8.6–10.2)
Chloride: 98 mmol/L (ref 97–108)
Creatinine, Ser: 1.5 mg/dL — ABNORMAL HIGH (ref 0.76–1.27)
GFR calc Af Amer: 55 mL/min/{1.73_m2} — ABNORMAL LOW (ref >59)
GFR calc non Af Amer: 47 mL/min/{1.73_m2} — ABNORMAL LOW (ref >59)
Globulin, Total: 2.5 g/dL (ref 1.5–4.5)
Glucose: 121 mg/dL — ABNORMAL HIGH (ref 65–99)
Potassium: 5.1 mmol/L (ref 3.5–5.2)
Sodium: 140 mmol/L (ref 134–144)
Total Bilirubin: 0.4 mg/dL (ref 0.0–1.2)
Total Protein: 7.3 g/dL (ref 6.0–8.5)

## 2014-01-12 LAB — PSA, TOTAL AND FREE
PSA, Free Pct: >10 %
PSA, Free: 0.01 ng/mL
PSA: <0.1 ng/mL (ref 0.0–4.0)

## 2014-01-14 ENCOUNTER — Encounter: Payer: Self-pay | Admitting: Nurse Practitioner

## 2014-01-17 ENCOUNTER — Telehealth: Payer: Self-pay | Admitting: *Deleted

## 2014-01-17 MED ORDER — BUDESONIDE-FORMOTEROL FUMARATE 160-4.5 MCG/ACT IN AERO: 2.0000 | INHALATION_SPRAY | Freq: Two times a day (BID) | RESPIRATORY_TRACT | Status: DC

## 2014-01-17 NOTE — Telephone Encounter (Signed)
symb[facility]ort [facility]

## 2014-01-17 NOTE — Telephone Encounter (Signed)
Received fax from mail order requesting refill on Symbicort 160/4.5. Do not see on med list. Please advise on refill

## 2014-01-22 ENCOUNTER — Other Ambulatory Visit: Payer: Self-pay

## 2014-01-22 MED ORDER — FLUTICASONE-SALMETEROL 100-50 MCG/DOSE IN AEPB: 1.0000 | INHALATION_SPRAY | Freq: Two times a day (BID) | RESPIRATORY_TRACT | Status: DC

## 2014-02-06 DIAGNOSIS — C44319 Basal cell carcinoma of skin of other parts of face: Secondary | ICD-10-CM | POA: Diagnosis not present

## 2014-02-07 DIAGNOSIS — C44319 Basal cell carcinoma of skin of other parts of face: Secondary | ICD-10-CM | POA: Diagnosis not present

## 2014-02-12 ENCOUNTER — Ambulatory Visit (INDEPENDENT_AMBULATORY_CARE_PROVIDER_SITE_OTHER): Payer: Medicare Other | Admitting: Cardiology

## 2014-02-12 ENCOUNTER — Encounter: Payer: Self-pay | Admitting: Cardiology

## 2014-02-12 VITALS — BP 122/64 | HR 76 | Ht 76.0 in | Wt 265.0 lb

## 2014-02-12 DIAGNOSIS — I10 Essential (primary) hypertension: Secondary | ICD-10-CM

## 2014-02-12 DIAGNOSIS — I4891 Unspecified atrial fibrillation: Secondary | ICD-10-CM | POA: Diagnosis not present

## 2014-02-12 NOTE — Progress Notes (Signed)
Patient ID: BLAYDEN CONWELL, male   DOB: 12/31/1945, 68 y.o.   MRN: [id number]    Patient Name: [name] [name] Date of Encounter: 02/12/2014  Primary Care Provider:  Redge Gainer, MD Primary Cardiologist:  [name] [name]   Patient Profile  Atrial fibrillation  Problem List   Past Medical History  Diagnosis Date  . GERD (gastroesophageal reflux disease)   . Primary malignant neoplasm of prostate identified by needle biopsy (T1c)   . Hyperlipemia 06/02/2013  . Asthma, chronic 06/02/2013  . Depression 06/02/2013  . DYSPNEA ON EXERTION 06/04/2009    Qualifier: Diagnosis of  By: Domenic Polite, MD, [facility]   . LEG PAIN, BILATERAL 06/04/2009    Qualifier: Diagnosis of  By: Domenic Polite, MD, [facility]   . PERCUTANEOUS TRANSLUMINAL CORONARY ANGIOPLASTY, HX OF 06/04/2009    Qualifier: Diagnosis of  By: Domenic Polite, MD, [facility], Roney Marion    Past Surgical History  Procedure Laterality Date  . Hiatal hernia repair    . Laparoscopic gastric banding  2000    [facility]   . Cardiac catheterization  1997  . Robot assisted laparoscopic radical prostatectomy      Allergies  No Known Allergies  HPI  68 year old male with a history of COPD, hyperlipidemia, prior history of prostate cancer now in remission who was referred to [place] for a finding of atrial fibrillation but his primary care physician. The patient was previously very active is about 10 years ago he was running to 3 miles daily, afterwards he started to have joint problems and had multiple surgeries that led to significant deconditioning. The patient is stating that for at least the last year he felt significant shortness of breath, he walks every day 20 minutes to 1 hour any incline makes him gasp for breath. He denies any palpitations. The patient is complaining of dizzy spells that has been increasing in frequency and he had one episode of near-syncope, after he was walking his dog. The patient is not aware of palpitations  at that time. The patient isn't denies symptoms of orthopnea, paroxysmal nocturnal dyspnea, lower extremity edema.  Six week follow up, no palpitations, didn't tolerate e cardio well, only wore it for 8 days.  02/12/2014 - 6 months follow up - no palpitations, BP controlled, no complains.   Home Medications  Prior to Admission medications   Medication Sig Start Date End Date Taking? Authorizing Provider  budesonide-formoterol (SYMBICORT) 160-4.5 MCG/ACT inhaler Inhale 2 puffs into the lungs 2 (two) times daily. 06/02/13   Mary-Margaret Hassell Done, FNP  citalopram (CELEXA) 20 MG tablet Take 1 tablet (20 mg total) by mouth daily. 06/02/13   Mary-Margaret Hassell Done, FNP  fenofibrate 160 MG tablet Take 1 tablet (160 mg total) by mouth daily. 06/02/13   Mary-Margaret Hassell Done, FNP  montelukast (SINGULAIR) 10 MG tablet Take 1 tablet (10 mg total) by mouth at bedtime. 06/02/13   Mary-Margaret Hassell Done, FNP  Naproxen Sodium (NAPRELAN) 750 MG TB24 Take 750 mg by mouth daily.    Historical Provider, MD  simvastatin (ZOCOR) 40 MG tablet Take 1 tablet (40 mg total) by mouth at bedtime. 06/02/13   Mary-Margaret Hassell Done, FNP    Family History  Family History  Problem Relation Age of Onset  . Heart disease      Early Cardiac disease for uncle is their 56's as well as deceased.    Social History  History   Social History  . Marital Status: Married    Spouse Name: N/A  Number of Children: N/A  . Years of Education: N/A   Occupational History  . Not on file.   Social History Main Topics  . Smoking status: Former Smoker -- 1.00 packs/day for 20 years    Types: Cigarettes    Quit date: 01/19/1996  . Smokeless tobacco: Never Used  . Alcohol Use: Yes  . Drug Use: No  . Sexual Activity: Not on file   Other Topics Concern  . Not on file   Social History Narrative  . No narrative on file     Review of Systems, as per history of present illness otherwise negative General:  No chills, fever, night  sweats or weight changes.  Cardiovascular:  No chest pain, dyspnea on exertion, edema, orthopnea, palpitations, paroxysmal nocturnal dyspnea. Dermatological: No rash, lesions/masses Respiratory: No cough, dyspnea Urologic: No hematuria, dysuria Abdominal:   No nausea, vomiting, diarrhea, bright red blood per rectum, melena, or hematemesis Neurologic:  No visual changes, wkns, changes in mental status. All other systems reviewed and are otherwise negative except as noted above.  Physical Exam  BP 142/70, HR 70 BPM General: Pleasant, NAD Psych: Normal affect. Neuro: Alert and oriented X 3. Moves all extremities spontaneously. HEENT: Normal  Neck: Supple without bruits or JVD. Lungs:  Resp regular and unlabored, CTA. Heart: RRR no s3, s4, or murmurs. Abdomen: Soft, non-tender, non-distended, BS + x 4.  Extremities: No clubbing, cyanosis or edema. DP/PT/Radials 2+ and equal bilaterally.  Accessory Clinical Findings  ECG - sinus rhythm 61 beats per minute, normal EKG   TTE:  Left ventricle: The cavity size was normal. Wall thickness was increased in a pattern of moderate LVH. Systolic function was normal. The estimated ejection fraction was in the range of 50% to 55%. Wall motion was normal; there were no regional wall motion abnormalities. Doppler parameters are consistent with abnormal left ventricular relaxation (grade 1 diastolic dysfunction).  ------------------------------------------------------------ Aortic valve: Trileaflet; normal thickness leaflets. Mobility was not restricted. Doppler: Transvalvular velocity was within the normal range. There was no stenosis. No regurgitation.  ------------------------------------------------------------ Aorta: Aortic root: The aortic root was normal in size.  ------------------------------------------------------------ Mitral valve: Structurally normal valve. Mobility was not restricted. Doppler: Transvalvular velocity was  within the normal range. There was no evidence for stenosis. No regurgitation. Peak gradient: 60mm Hg (D).  ------------------------------------------------------------ Left atrium: The atrium was mildly dilated.  ------------------------------------------------------------ Right ventricle: The cavity size was normal. Systolic function was normal.  ------------------------------------------------------------ Pulmonic valve: Doppler: Transvalvular velocity was within the normal range. There was no evidence for stenosis.  ------------------------------------------------------------ Tricuspid valve: Structurally normal valve. Doppler: Transvalvular velocity was within the normal range. No regurgitation.  ------------------------------------------------------------ Right atrium: The atrium was normal in size.  ------------------------------------------------------------ Pericardium: There was no pericardial effusion.  ------------------------------------------------------------ Systemic veins: Inferior vena cava: The vessel was normal in size.   Assessment & Plan  68 year old male  1. New diagnosis of paroxysmal atrial fibrillation - frequent episodes of dizziness, one episode of near-syncope. The patient just returned e cardio monitor this morning so we don't have the results yet. No palpitations meanwhile. He only wore it for 7-8 days as he didn't tolerate it well. Echocardiogram shows mild LA enlargement. We will start him on aspirin 81 mg po daily for now. TSH normal. Check TSH today.   2. Hypertension - echo shows moderate LVH, BP now controlled with amlodipine to 5 mg po daily. Mild ocassional LE edema, but that was present before amlodipine was started. The patient is advised to  continue exercising but to stop weight lifting.  3. Lipid profile - the patient states that he does check his primary care physician office and it was within normal limits, currently on simvastatin  40 mg daily  4. CKD stage 3 - possibly due to naproxen the patient is using for his chronic back pain, he decreased to dose to half, we will check Crea today  Followup in 1 year.   [name] Spark, MD 02/12/2014, 11:13 AM

## 2014-02-12 NOTE — Patient Instructions (Signed)
Your physician recommends that you continue on your current medications as directed. Please refer to the Current Medication list given to you today.  Your physician wants you to follow-up in: 1 year ov You will receive a reminder letter in the mail two months in advance. If you don't receive a letter, please call our office to schedule the follow-up appointment.  

## 2014-03-01 DIAGNOSIS — Z85828 Personal history of other malignant neoplasm of skin: Secondary | ICD-10-CM | POA: Diagnosis not present

## 2014-03-01 DIAGNOSIS — L910 Hypertrophic scar: Secondary | ICD-10-CM | POA: Diagnosis not present

## 2014-04-12 DIAGNOSIS — L910 Hypertrophic scar: Secondary | ICD-10-CM | POA: Diagnosis not present

## 2014-04-12 DIAGNOSIS — Z85828 Personal history of other malignant neoplasm of skin: Secondary | ICD-10-CM | POA: Diagnosis not present

## 2014-05-15 ENCOUNTER — Other Ambulatory Visit: Payer: Self-pay | Admitting: Nurse Practitioner

## 2014-05-16 NOTE — Telephone Encounter (Signed)
Last ov 4/15. 

## 2014-05-17 ENCOUNTER — Other Ambulatory Visit: Payer: Self-pay | Admitting: Cardiology

## 2014-05-17 ENCOUNTER — Other Ambulatory Visit: Payer: Self-pay | Admitting: Nurse Practitioner

## 2014-06-12 ENCOUNTER — Other Ambulatory Visit: Payer: Self-pay | Admitting: Nurse Practitioner

## 2014-06-25 ENCOUNTER — Ambulatory Visit (INDEPENDENT_AMBULATORY_CARE_PROVIDER_SITE_OTHER): Payer: Medicare Other

## 2014-06-25 DIAGNOSIS — Z23 Encounter for immunization: Secondary | ICD-10-CM | POA: Diagnosis not present

## 2014-06-26 DIAGNOSIS — L821 Other seborrheic keratosis: Secondary | ICD-10-CM | POA: Diagnosis not present

## 2014-06-26 DIAGNOSIS — L820 Inflamed seborrheic keratosis: Secondary | ICD-10-CM | POA: Diagnosis not present

## 2014-06-26 DIAGNOSIS — L738 Other specified follicular disorders: Secondary | ICD-10-CM | POA: Diagnosis not present

## 2014-06-26 DIAGNOSIS — L570 Actinic keratosis: Secondary | ICD-10-CM | POA: Diagnosis not present

## 2014-06-26 DIAGNOSIS — L579 Skin changes due to chronic exposure to nonionizing radiation, unspecified: Secondary | ICD-10-CM | POA: Diagnosis not present

## 2014-06-26 DIAGNOSIS — L814 Other melanin hyperpigmentation: Secondary | ICD-10-CM | POA: Diagnosis not present

## 2014-06-26 DIAGNOSIS — D492 Neoplasm of unspecified behavior of bone, soft tissue, and skin: Secondary | ICD-10-CM | POA: Diagnosis not present

## 2014-06-27 ENCOUNTER — Telehealth: Payer: Self-pay | Admitting: Cardiology

## 2014-06-27 DIAGNOSIS — R6882 Decreased libido: Secondary | ICD-10-CM | POA: Diagnosis not present

## 2014-06-27 DIAGNOSIS — N5201 Erectile dysfunction due to arterial insufficiency: Secondary | ICD-10-CM | POA: Diagnosis not present

## 2014-06-27 DIAGNOSIS — Z8546 Personal history of malignant neoplasm of prostate: Secondary | ICD-10-CM | POA: Diagnosis not present

## 2014-06-27 NOTE — Telephone Encounter (Signed)
New problem   Pt's pressure is 150/75 and has been going on for 2-3 weeks. Pt's wife would like to speak to nurse. Please call pt's wife.

## 2014-06-27 NOTE — Telephone Encounter (Signed)
Pts wife wanted to call and update Dr [name] that the pt has been to see his PCP about 2 weeks ago, and noted that pts BP has been in the 150s/70s consistently for that course of time.  Wife states that the pt is compliant with taking all his medications. Wife states the pt is not being very compliant with proper dieting and exercise. Wife states the pt eats a lot of foods high in sodium, and drinks lots of sodas, and no water.  Wife states the pt denies any cp, sob, palpitations, dizziness, pre-syncopal, syncopal episodes, HAs, visual disturbances, or any other complaints at this time.  Wife states that maybe Dr [name] should further advise on a different BP medication or increase the current one.  Provided pt education on healthy foods, and low sodium diet, and the need to drink plenty of water.  Informed the wife I can mail some references on low sodium diet, and heart healthy diet.  Informed the wife that Dr [name] is out of the office, but I will route this message to her for further review and recommendation and follow-up thereafter.  Pts wife verbalized understanding and agrees with this plan.

## 2014-06-28 MED ORDER — ATENOLOL 25 MG PO TABS: 25.0000 mg | ORAL_TABLET | Freq: Every day | ORAL | Status: DC

## 2014-06-28 NOTE — Addendum Note (Signed)
Addended by: Nuala Alpha on: 06/28/2014 05:27 PM   Modules accepted: Orders

## 2014-06-28 NOTE — Telephone Encounter (Signed)
Contacted the pt to make him aware that per Dr [name] he should continue taking his amlodipine 5 mg po daily, and start on atenolol 25 mg po daily to address his HTN.  Confirmed the pharmacy of choice with the pt. Also informed the pt that per Dr [name], we need to see him in the office in 2 months for f/u.  Informed the pt that I will notify our schedulers to contact him to have this appt set up.  Pt verbalized understanding and agrees with this plan.

## 2014-06-28 NOTE — Telephone Encounter (Signed)
He is supposed to be on amlodipine 5 mg po daily. I would add atenolol 25 mg po daily. Please have him follow up in 1-2 months. Thank you

## 2014-06-29 ENCOUNTER — Telehealth: Payer: Self-pay | Admitting: Cardiology

## 2014-06-29 NOTE — Telephone Encounter (Signed)
New message     Talk to [facility] regarding the new medication Dr [name] put him on

## 2014-06-29 NOTE — Telephone Encounter (Signed)
Pt states his was told during phone conversation a couple of days ago his medical record indicates he had an angioplasty of his heart in 2010. His problem list does indicate PTCA, hx of 06/04/2009. Pt states he had a cardiac catheterization in the past but he has never had an angioplasty. Pt would like his medical record corrected. Pt aware I am forwarding this to Arlyn Leak, clinical services manager to help facilitate correcting his medical record. I will also forward to Dr [name].

## 2014-07-06 ENCOUNTER — Other Ambulatory Visit: Payer: Self-pay | Admitting: Nurse Practitioner

## 2014-07-09 NOTE — Telephone Encounter (Signed)
Last seen 01/01/14 MMM

## 2014-07-16 ENCOUNTER — Other Ambulatory Visit: Payer: Self-pay | Admitting: Nurse Practitioner

## 2014-08-01 ENCOUNTER — Telehealth: Payer: Self-pay | Admitting: Nurse Practitioner

## 2014-08-01 NOTE — Telephone Encounter (Signed)
Pt given appt tomorrow with MMM @ 5

## 2014-08-02 ENCOUNTER — Ambulatory Visit (INDEPENDENT_AMBULATORY_CARE_PROVIDER_SITE_OTHER): Payer: Medicare Other | Admitting: Nurse Practitioner

## 2014-08-02 ENCOUNTER — Encounter: Payer: Self-pay | Admitting: Nurse Practitioner

## 2014-08-02 VITALS — BP 152/69 | HR 69 | Temp 98.8°F | Ht 76.0 in | Wt 260.0 lb

## 2014-08-02 DIAGNOSIS — J209 Acute bronchitis, unspecified: Secondary | ICD-10-CM | POA: Diagnosis not present

## 2014-08-02 MED ORDER — METHYLPREDNISOLONE ACETATE 80 MG/ML IJ SUSP
80.0000 mg | Freq: Once | INTRAMUSCULAR | Status: AC
  Administered 2014-08-02: 80 mg via INTRAMUSCULAR

## 2014-08-02 MED ORDER — AMOXICILLIN 875 MG PO TABS: 875.0000 mg | ORAL_TABLET | Freq: Two times a day (BID) | ORAL | Status: DC

## 2014-08-02 MED ORDER — BENZONATATE 200 MG PO CAPS: 200.0000 mg | ORAL_CAPSULE | Freq: Two times a day (BID) | ORAL | Status: DC | PRN

## 2014-08-02 NOTE — Patient Instructions (Signed)

## 2014-08-02 NOTE — Progress Notes (Signed)
   Subjective:    Patient ID: CHANCELOR HARDRICK, male    DOB: Nov 09, 1945, 68 y.o.   MRN: [id number]  HPI Patient in today c/o cough and congestion- started last Saturday and has not gotten any better- has been taking OTC meds with no relief.    Review of Systems  Constitutional: Negative for fever and chills.  HENT: Positive for congestion and sinus pressure. Negative for ear pain, sore throat, trouble swallowing and voice change.   Respiratory: Positive for cough.   Cardiovascular: Negative.   Gastrointestinal: Negative.   Genitourinary: Negative.   Neurological: Positive for headaches.  Psychiatric/Behavioral: Negative.   All other systems reviewed and are negative.      Objective:   Physical Exam  Constitutional: He is oriented to person, place, and time. He appears well-developed and well-nourished.  HENT:  Right Ear: Hearing, tympanic membrane, external ear and ear canal normal.  Left Ear: Hearing, tympanic membrane, external ear and ear canal normal.  Nose: Mucosal edema and rhinorrhea present. Right sinus exhibits maxillary sinus tenderness and frontal sinus tenderness. Left sinus exhibits maxillary sinus tenderness and frontal sinus tenderness.  Mouth/Throat: Uvula is midline, oropharynx is clear and moist and mucous membranes are normal.  Neck: Normal range of motion.  Cardiovascular: Normal rate, regular rhythm and normal heart sounds.   Pulmonary/Chest: Effort normal and breath sounds normal.  Deep dry hacky cough  Abdominal: Soft. Bowel sounds are normal.  Lymphadenopathy:    He has no cervical adenopathy.  Neurological: He is alert and oriented to person, place, and time.  Skin: Skin is warm and dry.  Psychiatric: He has a normal mood and affect. His behavior is normal. Judgment and thought content normal.   BP 152/69 mmHg  Pulse 69  Temp(Src) 98.8 F (37.1 C) (Oral)  Ht 6\' 4"  (1.93 m)  Wt 260 lb (117.935 kg)  BMI 31.66 kg/m2        Assessment & Plan:    1. Acute bronchitis, unspecified organism 1. Take meds as prescribed 2. Use a cool mist humidifier especially during the winter months and when heat has been humid. 3. Use saline nose sprays frequently 4. Saline irrigations of the nose can be very helpful if done frequently.  * 4X daily for 1 week*  * Use of a nettie pot can be helpful with this. Follow directions with this* 5. Drink plenty of fluids 6. Keep thermostat turn down low 7.For any cough or congestion  Use plain Mucinex- regular strength or max strength is fine   * Children- consult with Pharmacist for dosing 8. For fever or aces or pains- take tylenol or ibuprofen appropriate for age and weight.  * for fevers greater than 101 orally you may alternate ibuprofen and tylenol every  3 hours.    - amoxicillin (AMOXIL) 875 MG tablet; Take 1 tablet (875 mg total) by mouth 2 (two) times daily.  Dispense: 20 tablet; Refill: 0 - benzonatate (TESSALON) 200 MG capsule; Take 1 capsule (200 mg total) by mouth 2 (two) times daily as needed for cough.  Dispense: 20 capsule; Refill: 0 - methylPREDNISolone acetate (DEPO-MEDROL) injection 80 mg; Inject 1 mL (80 mg total) into the muscle once.   Mary-Margaret Hassell Done, FNP

## 2014-08-13 ENCOUNTER — Other Ambulatory Visit: Payer: Self-pay | Admitting: Nurse Practitioner

## 2014-08-28 ENCOUNTER — Other Ambulatory Visit: Payer: Medicare Other

## 2014-08-28 ENCOUNTER — Encounter: Payer: Self-pay | Admitting: Cardiology

## 2014-08-28 ENCOUNTER — Ambulatory Visit (INDEPENDENT_AMBULATORY_CARE_PROVIDER_SITE_OTHER): Payer: Medicare Other | Admitting: Cardiology

## 2014-08-28 VITALS — BP 128/62 | HR 52 | Ht 76.0 in | Wt 261.0 lb

## 2014-08-28 DIAGNOSIS — E785 Hyperlipidemia, unspecified: Secondary | ICD-10-CM | POA: Diagnosis not present

## 2014-08-28 DIAGNOSIS — N1831 Chronic kidney disease, stage 3a: Secondary | ICD-10-CM

## 2014-08-28 DIAGNOSIS — N183 Chronic kidney disease, stage 3 (moderate): Secondary | ICD-10-CM | POA: Diagnosis not present

## 2014-08-28 DIAGNOSIS — I480 Paroxysmal atrial fibrillation: Secondary | ICD-10-CM | POA: Diagnosis not present

## 2014-08-28 DIAGNOSIS — I10 Essential (primary) hypertension: Secondary | ICD-10-CM

## 2014-08-28 LAB — COMPREHENSIVE METABOLIC PANEL
ALT: 22 U/L (ref 0–53)
AST: 23 U/L (ref 0–37)
Albumin: 4.6 g/dL (ref 3.5–5.2)
Alkaline Phosphatase: 40 U/L (ref 39–117)
BUN: 20 mg/dL (ref 6–23)
CO2: 30 mEq/L (ref 19–32)
Calcium: 9.6 mg/dL (ref 8.4–10.5)
Chloride: 97 mEq/L (ref 96–112)
Creatinine, Ser: 1.7 mg/dL — ABNORMAL HIGH (ref 0.4–1.5)
GFR: 44.01 mL/min — ABNORMAL LOW (ref >60.00)
Glucose, Bld: 103 mg/dL — ABNORMAL HIGH (ref 70–99)
Potassium: 4.3 mEq/L (ref 3.5–5.1)
Sodium: 133 mEq/L — ABNORMAL LOW (ref 135–145)
Total Bilirubin: 0.7 mg/dL (ref 0.2–1.2)
Total Protein: 7.8 g/dL (ref 6.0–8.3)

## 2014-08-28 LAB — LIPID PANEL
Cholesterol: 165 mg/dL (ref 0–200)
HDL: 46.8 mg/dL (ref >39.00)
NonHDL: 118.2
Total CHOL/HDL Ratio: 4
Triglycerides: 204 mg/dL — ABNORMAL HIGH (ref 0.0–149.0)
VLDL: 40.8 mg/dL — ABNORMAL HIGH (ref 0.0–40.0)

## 2014-08-28 LAB — LDL CHOLESTEROL, DIRECT: Direct LDL: 92.8 mg/dL

## 2014-08-28 NOTE — Patient Instructions (Signed)
Your physician wants you to follow-up in: 1 YEAR with Dr. Johann Capers will receive a reminder letter in the mail two months in advance. If you don't receive a letter, please call our office to schedule the follow-up appointment.   Your physician recommends that you return for lab work in: TODAY (Lipid/CMET)

## 2014-08-28 NOTE — Progress Notes (Signed)
Patient ID: [name] [name], male   DOB: 05-20-1946, 68 y.o.   MRN: [id number]    Patient Name: [name] [name] Date of Encounter: 08/28/2014  Primary Care Provider:  Redge Gainer, MD Primary Cardiologist:  Ena Dawley H   Patient Profile  Atrial fibrillation  Problem List   Past Medical History  Diagnosis Date  . GERD (gastroesophageal reflux disease)   . Primary malignant neoplasm of prostate identified by needle biopsy (T1c)   . Hyperlipemia 06/02/2013  . Asthma, chronic 06/02/2013  . Depression 06/02/2013  . DYSPNEA ON EXERTION 06/04/2009    Qualifier: Diagnosis of  By: Domenic Polite, MD, [facility]   . LEG PAIN, BILATERAL 06/04/2009    Qualifier: Diagnosis of  By: Domenic Polite, MD, [facility]   . PERCUTANEOUS TRANSLUMINAL CORONARY ANGIOPLASTY, HX OF 06/04/2009    Qualifier: Diagnosis of  By: Domenic Polite, MD, [facility], [facility]    Past Surgical History  Procedure Laterality Date  . Hiatal hernia repair    . Laparoscopic gastric banding  2000    [facility]   . Cardiac catheterization  1997  . Robot assisted laparoscopic radical prostatectomy      Allergies  No Known Allergies  HPI  68 year old male with a history of COPD, hyperlipidemia, prior history of prostate cancer now in remission who was referred to [place] for a finding of atrial fibrillation but his primary care physician. The patient was previously very active is about 10 years ago he was running to 3 miles daily, afterwards he started to have joint problems and had multiple surgeries that led to significant deconditioning. The patient is stating that for at least the last year he felt significant shortness of breath, he walks every day 20 minutes to 1 hour any incline makes him gasp for breath. He denies any palpitations. The patient is complaining of dizzy spells that has been increasing in frequency and he had one episode of near-syncope, after he was walking his dog. The patient is not aware of  palpitations at that time. The patient isn't denies symptoms of orthopnea, paroxysmal nocturnal dyspnea, lower extremity edema.  Six week follow up, no palpitations, didn't tolerate e cardio well, only wore it for 8 days.  02/12/2014 - 6 months follow up - no palpitations, BP controlled, no complains.   08/28/2014 - 6 months follow-up. The patient denies any palpitations or syncope. Since he was started on atenolol he feels mild dizziness, his level of fatigue is the same. He overall feels great he walks he stopped daily without any significant problem other than knee pain. He denies any chest pain or shortness of breath. No lower extremity edema orthopnea or proximal nocturnal dyspnea.  Home Medications  Prior to Admission medications   Medication Sig Start Date End Date Taking? Authorizing Provider  budesonide-formoterol (SYMBICORT) 160-4.5 MCG/ACT inhaler Inhale 2 puffs into the lungs 2 (two) times daily. 06/02/13   Mary-Margaret Hassell Done, FNP  citalopram (CELEXA) 20 MG tablet Take 1 tablet (20 mg total) by mouth daily. 06/02/13   Mary-Margaret Hassell Done, FNP  fenofibrate 160 MG tablet Take 1 tablet (160 mg total) by mouth daily. 06/02/13   Mary-Margaret Hassell Done, FNP  montelukast (SINGULAIR) 10 MG tablet Take 1 tablet (10 mg total) by mouth at bedtime. 06/02/13   Mary-Margaret Hassell Done, FNP  Naproxen Sodium (NAPRELAN) 750 MG TB24 Take 750 mg by mouth daily.    Historical Provider, MD  simvastatin (ZOCOR) 40 MG tablet Take 1 tablet (40 mg total) by mouth at bedtime.  06/02/13   Mary-Margaret Hassell Done, FNP    Family History  Family History  Problem Relation Age of Onset  . Heart disease      Early Cardiac disease for uncle is their 72's as well as deceased.    Social History  History   Social History  . Marital Status: Married    Spouse Name: N/A    Number of Children: N/A  . Years of Education: N/A   Occupational History  . Not on file.   Social History Main Topics  . Smoking status:  Former Smoker -- 1.00 packs/day for 20 years    Types: Cigarettes    Quit date: 01/19/1996  . Smokeless tobacco: Never Used  . Alcohol Use: Yes  . Drug Use: No  . Sexual Activity: Not on file   Other Topics Concern  . Not on file   Social History Narrative     Review of Systems, as per history of present illness otherwise negative General:  No chills, fever, night sweats or weight changes.  Cardiovascular:  No chest pain, dyspnea on exertion, edema, orthopnea, palpitations, paroxysmal nocturnal dyspnea. Dermatological: No rash, lesions/masses Respiratory: No cough, dyspnea Urologic: No hematuria, dysuria Abdominal:   No nausea, vomiting, diarrhea, bright red blood per rectum, melena, or hematemesis Neurologic:  No visual changes, wkns, changes in mental status. All other systems reviewed and are otherwise negative except as noted above.  Physical Exam  BP 142/70, HR 70 BPM General: Pleasant, NAD Psych: Normal affect. Neuro: Alert and oriented X 3. Moves all extremities spontaneously. HEENT: Normal  Neck: Supple without bruits or JVD. Lungs:  Resp regular and unlabored, CTA. Heart: RRR no s3, s4, or murmurs. Abdomen: Soft, non-tender, non-distended, BS + x 4.  Extremities: No clubbing, cyanosis or edema. DP/PT/Radials 2+ and equal bilaterally.  Accessory Clinical Findings  ECG - sinus rhythm 61 beats per minute, normal EKG   TTE:  Left ventricle: The cavity size was normal. Wall thickness was increased in a pattern of moderate LVH. Systolic function was normal. The estimated ejection fraction was in the range of 50% to 55%. Wall motion was normal; there were no regional wall motion abnormalities. Doppler parameters are consistent with abnormal left ventricular relaxation (grade 1 diastolic dysfunction).  ------------------------------------------------------------ Aortic valve: Trileaflet; normal thickness leaflets. Mobility was not restricted. Doppler:  Transvalvular velocity was within the normal range. There was no stenosis. No regurgitation.  ------------------------------------------------------------ Aorta: Aortic root: The aortic root was normal in size.  ------------------------------------------------------------ Mitral valve: Structurally normal valve. Mobility was not restricted. Doppler: Transvalvular velocity was within the normal range. There was no evidence for stenosis. No regurgitation. Peak gradient: 51mm Hg (D).  ------------------------------------------------------------ Left atrium: The atrium was mildly dilated.  ------------------------------------------------------------ Right ventricle: The cavity size was normal. Systolic function was normal.  ------------------------------------------------------------ Pulmonic valve: Doppler: Transvalvular velocity was within the normal range. There was no evidence for stenosis.  ------------------------------------------------------------ Tricuspid valve: Structurally normal valve. Doppler: Transvalvular velocity was within the normal range. No regurgitation.  ------------------------------------------------------------ Right atrium: The atrium was normal in size.  ------------------------------------------------------------ Pericardium: There was no pericardial effusion.  ------------------------------------------------------------ Systemic veins: Inferior vena cava: The vessel was normal in size.  EKG- sinus bradycardia, nonspecific intraventricular conduction delay, borderline EKG   Assessment & Plan  68 year old male  1. New diagnosis of paroxysmal atrial fibrillation in 2014 - now in [place]. E-cardio monitor showed 0% a-fib.  No palpitations meanwhile.  Echocardiogram shows mild LA enlargement. We will start him on aspirin 81 mg  po daily for now. TSH normal.  2. Hypertension - echo shows moderate LVH, BP now controlled with amlodipine to 5 mg po daily and  atenolol 25 mg daily, to use any ACEI or ARB due to renal impairment.. Mild ocassional LE edema, but that was present before amlodipine was started. Atenolol causes a mild dizziness but he wants to continue as it controls his blood pressure so well.  3. Lipid profile - we'll check complete metabolic profile and lipids today.   4. CKD stage 3 - possibly due to naproxen the patient is using for his chronic back pain, he decreased to dose to half, we will check Crea today. Avoid ACEI or ARB.  Follow up in 1 year.   Dorothy Spark, MD 08/28/2014, 10:15 AM

## 2014-08-29 ENCOUNTER — Ambulatory Visit: Payer: Medicare Other | Admitting: Cardiology

## 2014-09-11 ENCOUNTER — Telehealth: Payer: Self-pay | Admitting: Nurse Practitioner

## 2014-09-11 MED ORDER — CEFDINIR 300 MG PO CAPS: 300.0000 mg | ORAL_CAPSULE | Freq: Two times a day (BID) | ORAL | Status: DC

## 2014-09-11 NOTE — Telephone Encounter (Signed)
omnicef rx ready for pick up

## 2014-09-12 ENCOUNTER — Other Ambulatory Visit: Payer: Self-pay | Admitting: Nurse Practitioner

## 2014-09-18 ENCOUNTER — Other Ambulatory Visit: Payer: Self-pay | Admitting: Nurse Practitioner

## 2014-10-08 ENCOUNTER — Emergency Department (HOSPITAL_COMMUNITY)
Admission: EM | Admit: 2014-10-08 | Discharge: 2014-10-08 | Disposition: A | Discharge status: Home / Self Care | Payer: Medicare Other | Attending: Emergency Medicine | Admitting: Emergency Medicine

## 2014-10-08 ENCOUNTER — Encounter (HOSPITAL_COMMUNITY): Payer: Self-pay

## 2014-10-08 ENCOUNTER — Other Ambulatory Visit: Payer: Self-pay | Admitting: Nurse Practitioner

## 2014-10-08 ENCOUNTER — Emergency Department (HOSPITAL_COMMUNITY): Payer: Medicare Other

## 2014-10-08 DIAGNOSIS — E785 Hyperlipidemia, unspecified: Secondary | ICD-10-CM | POA: Diagnosis not present

## 2014-10-08 DIAGNOSIS — Z9861 Coronary angioplasty status: Secondary | ICD-10-CM | POA: Diagnosis not present

## 2014-10-08 DIAGNOSIS — Z87891 Personal history of nicotine dependence: Secondary | ICD-10-CM | POA: Insufficient documentation

## 2014-10-08 DIAGNOSIS — Z8719 Personal history of other diseases of the digestive system: Secondary | ICD-10-CM | POA: Diagnosis not present

## 2014-10-08 DIAGNOSIS — Z8739 Personal history of other diseases of the musculoskeletal system and connective tissue: Secondary | ICD-10-CM | POA: Diagnosis not present

## 2014-10-08 DIAGNOSIS — J45909 Unspecified asthma, uncomplicated: Secondary | ICD-10-CM | POA: Diagnosis not present

## 2014-10-08 DIAGNOSIS — Z79899 Other long term (current) drug therapy: Secondary | ICD-10-CM | POA: Insufficient documentation

## 2014-10-08 DIAGNOSIS — Z8546 Personal history of malignant neoplasm of prostate: Secondary | ICD-10-CM | POA: Insufficient documentation

## 2014-10-08 DIAGNOSIS — Z9889 Other specified postprocedural states: Secondary | ICD-10-CM | POA: Insufficient documentation

## 2014-10-08 DIAGNOSIS — Z7982 Long term (current) use of aspirin: Secondary | ICD-10-CM | POA: Diagnosis not present

## 2014-10-08 DIAGNOSIS — R404 Transient alteration of awareness: Secondary | ICD-10-CM | POA: Diagnosis not present

## 2014-10-08 DIAGNOSIS — R42 Dizziness and giddiness: Secondary | ICD-10-CM | POA: Diagnosis not present

## 2014-10-08 DIAGNOSIS — R531 Weakness: Secondary | ICD-10-CM | POA: Diagnosis not present

## 2014-10-08 DIAGNOSIS — Z8659 Personal history of other mental and behavioral disorders: Secondary | ICD-10-CM | POA: Diagnosis not present

## 2014-10-08 LAB — CBC WITH DIFFERENTIAL/PLATELET
Basophils Absolute: 0 10*3/uL (ref 0.0–0.1)
Basophils Relative: 0 % (ref 0–1)
Eosinophils Absolute: 0.2 10*3/uL (ref 0.0–0.7)
Eosinophils Relative: 2 % (ref 0–5)
HCT: 38.5 % — ABNORMAL LOW (ref 39.0–52.0)
Hemoglobin: 12.2 g/dL — ABNORMAL LOW (ref 13.0–17.0)
Lymphocytes Relative: 30 % (ref 12–46)
Lymphs Abs: 2.8 10*3/uL (ref 0.7–4.0)
MCH: 30.3 pg (ref 26.0–34.0)
MCHC: 31.7 g/dL (ref 30.0–36.0)
MCV: 95.5 fL (ref 78.0–100.0)
Monocytes Absolute: 0.5 10*3/uL (ref 0.1–1.0)
Monocytes Relative: 6 % (ref 3–12)
Neutro Abs: 5.8 10*3/uL (ref 1.7–7.7)
Neutrophils Relative %: 62 % (ref 43–77)
Platelets: 259 10*3/uL (ref 150–400)
RBC: 4.03 MIL/uL — ABNORMAL LOW (ref 4.22–5.81)
RDW: 13.5 % (ref 11.5–15.5)
WBC: 9.4 10*3/uL (ref 4.0–10.5)

## 2014-10-08 LAB — COMPREHENSIVE METABOLIC PANEL
ALT: 23 U/L (ref 0–53)
AST: 26 U/L (ref 0–37)
Albumin: 4.4 g/dL (ref 3.5–5.2)
Alkaline Phosphatase: 40 U/L (ref 39–117)
Anion gap: 6 (ref 5–15)
BUN: 18 mg/dL (ref 6–23)
CO2: 29 mmol/L (ref 19–32)
Calcium: 9.2 mg/dL (ref 8.4–10.5)
Chloride: 100 mmol/L (ref 96–112)
Creatinine, Ser: 1.52 mg/dL — ABNORMAL HIGH (ref 0.50–1.35)
GFR calc Af Amer: 53 mL/min — ABNORMAL LOW (ref >90)
GFR calc non Af Amer: 45 mL/min — ABNORMAL LOW (ref >90)
Glucose, Bld: 112 mg/dL — ABNORMAL HIGH (ref 70–99)
Potassium: 4.3 mmol/L (ref 3.5–5.1)
Sodium: 135 mmol/L (ref 135–145)
Total Bilirubin: 0.5 mg/dL (ref 0.3–1.2)
Total Protein: 7.3 g/dL (ref 6.0–8.3)

## 2014-10-08 LAB — TROPONIN I
Troponin I: <0.03 ng/mL (ref <0.031)
Troponin I: <0.03 ng/mL (ref <0.031)

## 2014-10-08 MED ORDER — ONDANSETRON HCL 4 MG/2ML IJ SOLN
4.0000 mg | Freq: Once | INTRAMUSCULAR | Status: AC
  Administered 2014-10-08: 4 mg via INTRAVENOUS
  Filled 2014-10-08: qty 2

## 2014-10-08 MED ORDER — MECLIZINE HCL 12.5 MG PO TABS
25.0000 mg | ORAL_TABLET | Freq: Once | ORAL | Status: AC
  Administered 2014-10-08: 25 mg via ORAL
  Filled 2014-10-08: qty 2

## 2014-10-08 MED ORDER — ONDANSETRON HCL 4 MG PO TABS: 4.0000 mg | ORAL_TABLET | Freq: Four times a day (QID) | ORAL | Status: DC

## 2014-10-08 MED ORDER — MECLIZINE HCL 12.5 MG PO TABS: 12.5000 mg | ORAL_TABLET | Freq: Three times a day (TID) | ORAL | Status: DC | PRN

## 2014-10-08 MED ORDER — MECLIZINE HCL 25 MG PO TABS: 25.0000 mg | ORAL_TABLET | Freq: Three times a day (TID) | ORAL | Status: DC | PRN

## 2014-10-08 NOTE — ED Provider Notes (Signed)
CSN: [id number]     Arrival date & time 10/08/14  1649 History   First MD Initiated Contact with Patient 10/08/14 1730     Chief Complaint  Patient presents with  . Dizziness     (Consider location/radiation/quality/duration/timing/severity/associated sxs/prior Treatment) HPI Comments: Patient reports woke up from nap with severe vertigo and nausea. He was laying on the couch and when he went to get up he felt the room spinning. This improved with lying down. He became diaphoretic for about 1 minute. He felt well enough to go outside but upon returning inside, his dizziness persisted and returned. It is better with lying still and worse with movement. Denies any chest pain or shortness of breath. No abdominal pain or vomiting. Denies any fevers or chills. No focal weakness, numbness or tingling. History of hypertension, hyperlipidemia, questionable atrial fibrillation.  The history is provided by the patient.    Past Medical History  Diagnosis Date  . GERD (gastroesophageal reflux disease)   . Primary malignant neoplasm of prostate identified by needle biopsy (T1c)   . Hyperlipemia 06/02/2013  . Asthma, chronic 06/02/2013  . Depression 06/02/2013  . DYSPNEA ON EXERTION 06/04/2009    Qualifier: Diagnosis of  By: Domenic Polite, MD, [facility]   . LEG PAIN, BILATERAL 06/04/2009    Qualifier: Diagnosis of  By: Domenic Polite, MD, [facility]   . PERCUTANEOUS TRANSLUMINAL CORONARY ANGIOPLASTY, HX OF 06/04/2009    Qualifier: Diagnosis of  By: Domenic Polite, MD, [facility], [facility]    Past Surgical History  Procedure Laterality Date  . Hiatal hernia repair    . Laparoscopic gastric banding  2000    [facility]   . Cardiac catheterization  1997  . Robot assisted laparoscopic radical prostatectomy     Family History  Problem Relation Age of Onset  . Heart disease      Early Cardiac disease for uncle is their 32's as well as deceased.   History  Substance Use Topics  . Smoking status:  Former Smoker -- 1.00 packs/day for 20 years    Types: Cigarettes    Quit date: 01/19/1996  . Smokeless tobacco: Never Used  . Alcohol Use: Yes    Review of Systems  Constitutional: Negative for fever, activity change and appetite change.  HENT: Negative for congestion and rhinorrhea.   Eyes: Negative for visual disturbance.  Respiratory: Negative for cough, chest tightness and shortness of breath.   Cardiovascular: Negative for chest pain.  Gastrointestinal: Negative for nausea, vomiting and abdominal pain.  Genitourinary: Negative for dysuria and hematuria.  Musculoskeletal: Negative for myalgias, arthralgias and neck pain.  Skin: Negative for rash.  Neurological: Positive for dizziness, weakness and light-headedness.  A complete 10 system review of systems was obtained and all systems are negative except as noted in the HPI and PMH.      Allergies  Review of patient's allergies indicates no known allergies.  Home Medications   Prior to Admission medications   Medication Sig Start Date End Date Taking? Authorizing Provider  amLODipine (NORVASC) 5 MG tablet TAKE 1 TABLET DAILY 05/18/14  Yes Dorothy Spark, MD  aspirin EC 81 MG tablet Take 81 mg by mouth daily.   Yes Historical Provider, MD  atenolol (TENORMIN) 25 MG tablet Take 1 tablet (25 mg total) by mouth daily. 06/28/14  Yes Dorothy Spark, MD  budesonide-formoterol [facility]) 160-4.5 MCG/ACT inhaler Inhale 2 puffs into the lungs 2 (two) times daily. Patient taking differently: Inhale 2 puffs into the lungs  daily as needed (FOR SHORTNESS OF BREATH).  01/17/14  Yes Mary-Margaret Hassell Done, FNP  cholecalciferol (VITAMIN D) 1000 UNITS tablet Take 1,000 Units by mouth daily.   Yes Historical Provider, MD  citalopram (CELEXA) 20 MG tablet TAKE 1 TABLET DAILY 09/12/14  Yes Chipper Herb, MD  cyanocobalamin (,VITAMIN B-12,) 1000 MCG/ML injection INJECT 1ML EVERY MONTH 09/18/14  Yes Mary-Margaret Hassell Done, FNP  fenofibrate 160 MG  tablet TAKE 1 TABLET DAILY 09/12/14  Yes Chipper Herb, MD  Fluticasone-Salmeterol (ADVAIR) 100-50 MCG/DOSE AEPB Inhale 1 puff into the lungs 2 (two) times daily. Patient taking differently: Inhale 1 puff into the lungs daily as needed (FOR SHORTNESS OF BREATH).  01/22/14  Yes Mary-Margaret Hassell Done, FNP  montelukast (SINGULAIR) 10 MG tablet TAKE 1 TABLET DAILY 09/12/14  Yes Chipper Herb, MD  Multiple Vitamin (MULTIVITAMIN WITH MINERALS) TABS tablet Take 1 tablet by mouth daily.   Yes Historical Provider, MD  simvastatin (ZOCOR) 40 MG tablet TAKE 1 TABLET DAILY 09/12/14  Yes Chipper Herb, MD  cefdinir (OMNICEF) 300 MG capsule Take 1 capsule (300 mg total) by mouth 2 (two) times daily. Patient not taking: Reported on 10/08/2014 09/11/14   Mary-Margaret Hassell Done, FNP  meclizine (ANTIVERT) 12.5 MG tablet Take 1 tablet (12.5 mg total) by mouth 3 (three) times daily as needed for dizziness. 10/08/14   Ezequiel Essex, MD  ondansetron (ZOFRAN) 4 MG tablet Take 1 tablet (4 mg total) by mouth every 6 (six) hours. 10/08/14   Ezequiel Essex, MD   BP 135/61 mmHg  Pulse 58  Resp 18  Ht 6\' 2"  (1.88 m)  Wt 250 lb (113.399 kg)  BMI 32.08 kg/m2  SpO2 96% Physical Exam  Constitutional: He is oriented to person, place, and time. He appears well-developed and well-nourished. No distress.  HENT:  Head: Normocephalic and atraumatic.  Mouth/Throat: Oropharynx is clear and moist. No oropharyngeal exudate.  Ear canals clear.  Eyes: Conjunctivae and EOM are normal. Pupils are equal, round, and reactive to light.  Neck: Normal range of motion. Neck supple.  No meningismus.  Cardiovascular: Normal rate, regular rhythm, normal heart sounds and intact distal pulses.   No murmur heard. Pulmonary/Chest: Effort normal and breath sounds normal. No respiratory distress.  Abdominal: Soft. There is no tenderness. There is no rebound and no guarding.  Musculoskeletal: Normal range of motion. He exhibits no edema or  tenderness.  Neurological: He is alert and oriented to person, place, and time. No cranial nerve deficit. He exhibits normal muscle tone. Coordination normal.  No ataxia on finger to nose bilaterally. No pronator drift. 5/5 strength throughout. CN 2-12 intact. Negative Romberg. Equal grip strength. Sensation intact. Gait is normal.  No nystagmus. Test of skew negative. Head impulse testing negative.  Skin: Skin is warm.  Psychiatric: He has a normal mood and affect. His behavior is normal.  Nursing note and vitals reviewed.   ED Course  Procedures (including critical care time) Labs Review Labs Reviewed  CBC WITH DIFFERENTIAL/PLATELET - Abnormal; Notable for the following:    RBC 4.03 (*)    Hemoglobin 12.2 (*)    HCT 38.5 (*)    All other components within normal limits  COMPREHENSIVE METABOLIC PANEL - Abnormal; Notable for the following:    Glucose, Bld 112 (*)    Creatinine, Ser 1.52 (*)    GFR calc non Af Amer 45 (*)    GFR calc Af Amer 53 (*)    All other components within normal limits  TROPONIN  I  TROPONIN I    Imaging Review Mr Herby Abraham Contrast  10/08/2014   CLINICAL DATA:  69 year old male with acute onset of dizziness today. Initial encounter.  EXAM: MRI HEAD WITHOUT CONTRAST  TECHNIQUE: Multiplanar, multiecho pulse sequences of the brain and surrounding structures were obtained without intravenous contrast.  COMPARISON:  [facility] neck CT 10/06/2010.  FINDINGS: Cerebral volume is within normal limits for age. No restricted diffusion to suggest acute infarction. No midline shift, mass effect, evidence of mass lesion, ventriculomegaly, extra-axial collection or acute intracranial hemorrhage. Cervicomedullary junction and pituitary are within normal limits. Negative visualized cervical spine. Major intracranial vascular flow voids are within normal limits.  No cortical encephalomalacia. Minimal to mild for age scattered nonspecific cerebral white matter T2 and  FLAIR hyperintensity (e.g. Series 6, image 20), and mild deep gray matter nuclei T2 heterogeneity. Brainstem and cerebellum are within normal limits. Visible internal auditory structures appear normal.  Visualized orbit soft tissues are within normal limits. Trace paranasal sinus mucosal thickening. Mastoids are clear. Visualized scalp soft tissues are within normal limits. Bone marrow signal within normal limits.  IMPRESSION: No acute intracranial abnormality. Mild for age nonspecific signal changes in the brain.   Electronically Signed   By: Lars Pinks M.D.   On: 10/08/2014 20:32     EKG Interpretation   Date/Time:  Monday October 08 2014 20:09:28 EST Ventricular Rate:  56 PR Interval:    QRS Duration: 117 QT Interval:  464 QTC Calculation: 448 R Axis:   85 Text Interpretation:  normal sinus Nonspecific intraventricular conduction  delay No significant change was found Confirmed by Wyvonnia Dusky  MD, Codi Kertz  [phone]) on 10/08/2014 8:21:06 PM      MDM   Final diagnoses:  Vertigo   Acute onset of vertigo, worse with position change associated with nausea and episode of diaphoresis.  No chest pain or SOB.  nonfocal neuro exam with negative romberg, no ataxia.  EKG nsr. Orthostatics negative.  With age and risk factors, MRI obtained to rule out posterior circulation stroke. MRI negative.  Dizziness improved with meclizine. Tolerating PO and ambulatory. Troponin negative x2.  D/w patient diagnosis of peripheral vertigo.  Meclizine given. Return precautions discussed.   Ezequiel Essex, MD 10/08/14 2350

## 2014-10-08 NOTE — ED Notes (Signed)
MRI to get patient in 20 min. , pt.updated.

## 2014-10-08 NOTE — ED Notes (Signed)
Pt states he is feeling much better now than when he came in. No needs voiced. Pt remains on cardiac monitor w/ NIBP vital signs WNL. NAD noted.

## 2014-10-08 NOTE — ED Notes (Signed)
Pt alert & oriented x4, stable gait. Patient given discharge instructions, paperwork & prescription(s). Patient  instructed to stop at the registration desk to finish any additional paperwork. Patient verbalized understanding. Pt left department w/ no further questions. 

## 2014-10-08 NOTE — Discharge Instructions (Signed)
Benign Positional Vertigo There is no evidence of stroke. Take the dizziness medicine as prescribed. Follow up with your doctor. Return to the ED if you develop new or worsening symptoms. Vertigo means you feel like you or your surroundings are moving when they are not. Benign positional vertigo is the most common form of vertigo. Benign means that the cause of your condition is not serious. Benign positional vertigo is more common in older adults. CAUSES  Benign positional vertigo is the result of an upset in the labyrinth system. This is an area in the middle ear that helps control your balance. This may be caused by a viral infection, head injury, or repetitive motion. However, often no specific cause is found. SYMPTOMS  Symptoms of benign positional vertigo occur when you move your head or eyes in different directions. Some of the symptoms may include:  Loss of balance and falls.  Vomiting.  Blurred vision.  Dizziness.  Nausea.  Involuntary eye movements (nystagmus). DIAGNOSIS  Benign positional vertigo is usually diagnosed by physical exam. If the specific cause of your benign positional vertigo is unknown, your caregiver may perform imaging tests, such as magnetic resonance imaging (MRI) or computed tomography (CT). TREATMENT  Your caregiver may recommend movements or procedures to correct the benign positional vertigo. Medicines such as meclizine, benzodiazepines, and medicines for nausea may be used to treat your symptoms. In rare cases, if your symptoms are caused by certain conditions that affect the inner ear, you may need surgery. HOME CARE INSTRUCTIONS   Follow your caregiver's instructions.  Move slowly. Do not make sudden body or head movements.  Avoid driving.  Avoid operating heavy machinery.  Avoid performing any tasks that would be dangerous to you or others during a vertigo episode.  Drink enough fluids to keep your urine clear or pale yellow. SEEK IMMEDIATE  MEDICAL CARE IF:   You develop problems with walking, weakness, numbness, or using your arms, hands, or legs.  You have difficulty speaking.  You develop severe headaches.  Your nausea or vomiting continues or gets worse.  You develop visual changes.  Your family or friends notice any behavioral changes.  Your condition gets worse.  You have a fever.  You develop a stiff neck or sensitivity to light. MAKE SURE YOU:   Understand these instructions.  Will watch your condition.  Will get help right away if you are not doing well or get worse. Document Released: 06/08/2006 Document Revised: 11/23/2011 Document Reviewed: 05/21/2011 [facility] Patient Information 2015 [place], [place]. This information is not intended to replace advice given to you by your health care provider. Make sure you discuss any questions you have with your health care provider.

## 2014-10-08 NOTE — ED Notes (Signed)
Pt is ready to go. Pt wanting to move around, rail dropped to allow pt to sit on the side of the bed. Pt remains on cardiac monitor.

## 2014-10-08 NOTE — ED Notes (Signed)
EKG not done at this time due to patient over at Xray at this time.

## 2014-10-08 NOTE — ED Notes (Signed)
Pt ambulated around the nurses station. Stable gait. Pt states not back to normal but much better than when he came in to the ED. Pt also given drink & crackers. Pt remains on cardiac monitor w/ NIBP vital signs WNL. NAD noted. No needs voiced at this time.

## 2014-10-08 NOTE — ED Notes (Signed)
Pt complain of nausea and dizziness. States he was on the couch and when he went to get up he became real dizzy

## 2014-10-24 ENCOUNTER — Ambulatory Visit (INDEPENDENT_AMBULATORY_CARE_PROVIDER_SITE_OTHER): Payer: Medicare Other | Admitting: Nurse Practitioner

## 2014-10-24 ENCOUNTER — Encounter: Payer: Self-pay | Admitting: Nurse Practitioner

## 2014-10-24 VITALS — BP 128/64 | HR 61 | Temp 97.3°F | Ht 74.0 in | Wt 263.0 lb

## 2014-10-24 DIAGNOSIS — J4520 Mild intermittent asthma, uncomplicated: Secondary | ICD-10-CM | POA: Diagnosis not present

## 2014-10-24 DIAGNOSIS — K219 Gastro-esophageal reflux disease without esophagitis: Secondary | ICD-10-CM

## 2014-10-24 DIAGNOSIS — H8110 Benign paroxysmal vertigo, unspecified ear: Secondary | ICD-10-CM | POA: Diagnosis not present

## 2014-10-24 DIAGNOSIS — I10 Essential (primary) hypertension: Secondary | ICD-10-CM

## 2014-10-24 DIAGNOSIS — M79609 Pain in unspecified limb: Secondary | ICD-10-CM | POA: Diagnosis not present

## 2014-10-24 DIAGNOSIS — F32A Depression, unspecified: Secondary | ICD-10-CM

## 2014-10-24 DIAGNOSIS — F329 Major depressive disorder, single episode, unspecified: Secondary | ICD-10-CM

## 2014-10-24 DIAGNOSIS — E785 Hyperlipidemia, unspecified: Secondary | ICD-10-CM

## 2014-10-24 MED ORDER — FENOFIBRATE 160 MG PO TABS: 160.0000 mg | ORAL_TABLET | Freq: Every day | ORAL | Status: DC

## 2014-10-24 MED ORDER — MONTELUKAST SODIUM 10 MG PO TABS: 10.0000 mg | ORAL_TABLET | Freq: Every day | ORAL | Status: DC

## 2014-10-24 MED ORDER — BUDESONIDE-FORMOTEROL FUMARATE 160-4.5 MCG/ACT IN AERO: 2.0000 | INHALATION_SPRAY | Freq: Two times a day (BID) | RESPIRATORY_TRACT | Status: DC

## 2014-10-24 MED ORDER — AMLODIPINE BESYLATE 5 MG PO TABS: 5.0000 mg | ORAL_TABLET | Freq: Every day | ORAL | Status: DC

## 2014-10-24 MED ORDER — ATENOLOL 25 MG PO TABS: 25.0000 mg | ORAL_TABLET | Freq: Every day | ORAL | Status: DC

## 2014-10-24 MED ORDER — MECLIZINE HCL 12.5 MG PO TABS: 12.5000 mg | ORAL_TABLET | Freq: Three times a day (TID) | ORAL | Status: DC | PRN

## 2014-10-24 MED ORDER — CYANOCOBALAMIN 1000 MCG/ML IJ SOLN
INTRAMUSCULAR | Status: DC
Start: 2014-10-24 — End: 2015-09-23

## 2014-10-24 MED ORDER — SIMVASTATIN 40 MG PO TABS: 40.0000 mg | ORAL_TABLET | Freq: Every day | ORAL | Status: DC

## 2014-10-24 MED ORDER — FLUTICASONE-SALMETEROL 100-50 MCG/DOSE IN AEPB: 1.0000 | INHALATION_SPRAY | Freq: Two times a day (BID) | RESPIRATORY_TRACT | Status: DC

## 2014-10-24 MED ORDER — CITALOPRAM HYDROBROMIDE 20 MG PO TABS: 20.0000 mg | ORAL_TABLET | Freq: Every day | ORAL | Status: DC

## 2014-10-24 NOTE — Progress Notes (Signed)
Subjective:    Patient ID: [name] [name], male    DOB: [birth year], 69 y.o.   MRN: [id number]  Patient here today for follow up of chronic medical problems.  *pt reports he went to the ER last week due to dizziness, NV. Diagnosed with vertigo. All labs were normal.  Hyperlipidemia This is a chronic problem. The current episode started more than 1 year ago. Recent lipid tests were reviewed and are variable. Pertinent negatives include no chest pain, myalgias or shortness of breath. Current antihyperlipidemic treatment includes statins. The current treatment provides moderate improvement of lipids. Compliance problems include adherence to diet and adherence to exercise.  Risk factors for coronary artery disease include dyslipidemia, hypertension, male sex, obesity and a sedentary lifestyle.  Hypertension This is a chronic problem. The current episode started more than 1 year ago. The problem is unchanged. The problem is controlled. Pertinent negatives include no chest pain, headaches, palpitations or shortness of breath. Past treatments include calcium channel blockers. The current treatment provides moderate improvement. Compliance problems include diet and exercise.   Depression Pt takes Celexa 20 mg-Well controlled Asthma  Pt states his asthma is controlled- Pt states he uses his rescue inhaler 3-4 times a month-when he works outside- Also takes singulair 10 mg daily   Review of Systems  Constitutional: Negative.   HENT: Negative.   Respiratory: Negative for shortness of breath.   Cardiovascular: Negative for chest pain and palpitations.  Musculoskeletal: Negative for myalgias.  Neurological: Positive for dizziness and light-headedness. Negative for headaches.  Psychiatric/Behavioral: Negative.   All other systems reviewed and are negative.      Objective:   Physical Exam  Constitutional: He is oriented to person, place, and time. He appears well-developed and well-nourished.  HENT:   Head: Normocephalic and atraumatic.  Right Ear: External ear normal.  Left Ear: External ear normal.  Nose: Nose normal.  Mouth/Throat: Oropharynx is clear and moist.  Eyes: EOM are normal. Pupils are equal, round, and reactive to light.  Neck: Normal range of motion. Neck supple. No thyromegaly present.  Cardiovascular: Normal rate, regular rhythm, normal heart sounds and intact distal pulses.   Pulmonary/Chest: Effort normal and breath sounds normal. No respiratory distress. He has no wheezes.  Abdominal: Soft. Bowel sounds are normal.  Musculoskeletal: Normal range of motion. He exhibits no edema or tenderness.  Pt has peripheral numbness present in bilateral feet  Neurological: He is alert and oriented to person, place, and time.  Skin: Skin is warm and dry.  Psychiatric: He has a normal mood and affect. His behavior is normal. Judgment and thought content normal.  Vitals reviewed.    BP 128/64 mmHg  Pulse 61  Temp(Src) 97.3 F (36.3 C) (Oral)  Ht _0  (1.88 m)  Wt 263 lb (119.296 kg)  BMI 33.75 kg/m2       Assessment & Plan:   1. Asthma, chronic, mild intermittent, uncomplicated Avoid allergens - Fluticasone-Salmeterol (ADVAIR) 100-50 MCG/DOSE AEPB; Inhale 1 puff into the lungs 2 (two) times daily.  Dispense: 3 each; Refill: 1 - montelukast (SINGULAIR) 10 MG tablet; Take 1 tablet (10 mg total) by mouth daily.  Dispense: 90 tablet; Refill: 1  2. Gastroesophageal reflux disease, esophagitis presence not specified Avoid spicy and fatty foods  3. Pain in extremity, unspecified extremity, unspecified laterality  4. Hyperlipemia Low fat diet - NMR, lipoprofile - fenofibrate 160 MG tablet; Take 1 tablet (160 mg total) by mouth daily.  Dispense: 90 tablet; Refill: 1 - simvastatin (  ZOCOR) 40 MG tablet; Take 1 tablet (40 mg total) by mouth daily.  Dispense: 90 tablet; Refill: 1  5. Depression Stress managenemnt - citalopram (CELEXA) 20 MG tablet; Take 1 tablet (20 mg  total) by mouth daily.  Dispense: 30 tablet; Refill: 1  6. Essential hypertension Do ont add salt to diet - CMP14+EGFR - amLODipine (NORVASC) 5 MG tablet; Take 1 tablet (5 mg total) by mouth daily.  Dispense: 90 tablet; Refill: 1 - atenolol (TENORMIN) 25 MG tablet; Take 1 tablet (25 mg total) by mouth daily.  Dispense: 180 tablet; Refill: 1 - budesonide-formoterol (SYMBICORT) 160-4.5 MCG/ACT inhaler; Inhale 2 puffs into the lungs 2 (two) times daily.  Dispense: 3 Inhaler; Refill: 3  7. Benign paroxysmal positional vertigo, unspecified laterality Force fluids - meclizine (ANTIVERT) 12.5 MG tablet; Take 1 tablet (12.5 mg total) by mouth 3 (three) times daily as needed for dizziness.  Dispense: 30 tablet; Refill: 0    Labs pending Health maintenance reviewed Diet and exercise encouraged Continue all meds Follow up  In 3 months   [place], FNP

## 2014-10-24 NOTE — Patient Instructions (Signed)

## 2014-10-25 LAB — CMP14+EGFR
ALT: 24 IU/L (ref 0–44)
AST: 25 IU/L (ref 0–40)
Albumin/Globulin Ratio: 1.8 (ref 1.1–2.5)
Albumin: 4.9 g/dL — ABNORMAL HIGH (ref 3.6–4.8)
Alkaline Phosphatase: 44 IU/L (ref 39–117)
BUN/Creatinine Ratio: 11 (ref 10–22)
BUN: 17 mg/dL (ref 8–27)
Bilirubin Total: 0.3 mg/dL (ref 0.0–1.2)
CO2: 25 mmol/L (ref 18–29)
Calcium: 10.2 mg/dL (ref 8.6–10.2)
Chloride: 93 mmol/L — ABNORMAL LOW (ref 97–108)
Creatinine, Ser: 1.58 mg/dL — ABNORMAL HIGH (ref 0.76–1.27)
GFR calc Af Amer: 51 mL/min/{1.73_m2} — ABNORMAL LOW (ref >59)
GFR calc non Af Amer: 44 mL/min/{1.73_m2} — ABNORMAL LOW (ref >59)
Globulin, Total: 2.8 g/dL (ref 1.5–4.5)
Glucose: 93 mg/dL (ref 65–99)
Potassium: 4.6 mmol/L (ref 3.5–5.2)
Sodium: 136 mmol/L (ref 134–144)
Total Protein: 7.7 g/dL (ref 6.0–8.5)

## 2014-10-25 LAB — NMR, LIPOPROFILE
Cholesterol: 167 mg/dL (ref 100–199)
HDL Cholesterol by NMR: 43 mg/dL (ref >39)
HDL Particle Number: 41.5 umol/L (ref >=30.5)
LDL Particle Number: 1166 nmol/L — ABNORMAL HIGH (ref <1000)
LDL Size: 20.7 nm (ref >20.5)
LDL-C: 75 mg/dL (ref 0–99)
LP-IR Score: 89 — ABNORMAL HIGH (ref <=45)
Small LDL Particle Number: 572 nmol/L — ABNORMAL HIGH (ref <=527)
Triglycerides by NMR: 244 mg/dL — ABNORMAL HIGH (ref 0–149)

## 2014-11-01 DIAGNOSIS — E291 Testicular hypofunction: Secondary | ICD-10-CM | POA: Diagnosis not present

## 2014-11-07 DIAGNOSIS — R6882 Decreased libido: Secondary | ICD-10-CM | POA: Diagnosis not present

## 2014-11-07 DIAGNOSIS — E291 Testicular hypofunction: Secondary | ICD-10-CM | POA: Diagnosis not present

## 2014-11-07 DIAGNOSIS — Z8546 Personal history of malignant neoplasm of prostate: Secondary | ICD-10-CM | POA: Diagnosis not present

## 2015-01-11 ENCOUNTER — Other Ambulatory Visit: Payer: Self-pay | Admitting: Nurse Practitioner

## 2015-02-19 DIAGNOSIS — L821 Other seborrheic keratosis: Secondary | ICD-10-CM | POA: Diagnosis not present

## 2015-02-19 DIAGNOSIS — L814 Other melanin hyperpigmentation: Secondary | ICD-10-CM | POA: Diagnosis not present

## 2015-02-19 DIAGNOSIS — S30811A Abrasion of abdominal wall, initial encounter: Secondary | ICD-10-CM | POA: Diagnosis not present

## 2015-02-19 DIAGNOSIS — Z85828 Personal history of other malignant neoplasm of skin: Secondary | ICD-10-CM | POA: Diagnosis not present

## 2015-02-19 DIAGNOSIS — S50811A Abrasion of right forearm, initial encounter: Secondary | ICD-10-CM | POA: Diagnosis not present

## 2015-02-19 DIAGNOSIS — L579 Skin changes due to chronic exposure to nonionizing radiation, unspecified: Secondary | ICD-10-CM | POA: Diagnosis not present

## 2015-03-11 ENCOUNTER — Other Ambulatory Visit: Payer: Self-pay

## 2015-03-14 ENCOUNTER — Other Ambulatory Visit: Payer: Self-pay | Admitting: Nurse Practitioner

## 2015-03-14 NOTE — Telephone Encounter (Signed)
Last seen 10/24/14  MMM

## 2015-03-21 DIAGNOSIS — M47817 Spondylosis without myelopathy or radiculopathy, lumbosacral region: Secondary | ICD-10-CM | POA: Diagnosis not present

## 2015-03-21 DIAGNOSIS — M545 Low back pain: Secondary | ICD-10-CM | POA: Diagnosis not present

## 2015-04-15 ENCOUNTER — Other Ambulatory Visit: Payer: Self-pay | Admitting: Nurse Practitioner

## 2015-04-15 NOTE — Telephone Encounter (Signed)
last seen 10/24/14  MMM

## 2015-04-17 ENCOUNTER — Encounter: Payer: Self-pay | Admitting: Neurology

## 2015-04-17 ENCOUNTER — Ambulatory Visit (INDEPENDENT_AMBULATORY_CARE_PROVIDER_SITE_OTHER): Payer: Self-pay | Admitting: Neurology

## 2015-04-17 ENCOUNTER — Ambulatory Visit (INDEPENDENT_AMBULATORY_CARE_PROVIDER_SITE_OTHER): Payer: Medicare Other | Admitting: Neurology

## 2015-04-17 DIAGNOSIS — G5602 Carpal tunnel syndrome, left upper limb: Secondary | ICD-10-CM

## 2015-04-17 DIAGNOSIS — G5601 Carpal tunnel syndrome, right upper limb: Secondary | ICD-10-CM

## 2015-04-17 DIAGNOSIS — G609 Hereditary and idiopathic neuropathy, unspecified: Secondary | ICD-10-CM | POA: Diagnosis not present

## 2015-04-17 DIAGNOSIS — G5603 Carpal tunnel syndrome, bilateral upper limbs: Secondary | ICD-10-CM

## 2015-04-17 DIAGNOSIS — G5600 Carpal tunnel syndrome, unspecified upper limb: Secondary | ICD-10-CM | POA: Insufficient documentation

## 2015-04-17 HISTORY — DX: Carpal tunnel syndrome, unspecified upper limb: G56.00

## 2015-04-17 HISTORY — DX: Hereditary and idiopathic neuropathy, unspecified: G60.9

## 2015-04-17 NOTE — Procedures (Signed)
     HISTORY:  [name] [name] is a 69 year old gentleman with a 3 year history of numbness in the feet. He has chronic low back pain with pain radiating down the left greater than right legs at times. He reports some balance issues as well. He is sent for evaluation for possible peripheral neuropathy.  NERVE CONDUCTION STUDIES:  Nerve conduction studies were performed on both upper extremities. The distal motor latencies for the median nerves were prolonged bilaterally, with normal motor amplitudes for these nerves bilaterally. The distal motor latency and motor amplitude for the right ulnar nerve was normal. The F wave latencies for the median nerves were prolonged bilaterally, borderline normal for the right ulnar nerve. The nerve conduction velocities for the median nerves bilaterally and for the right ulnar nerve were normal. The sensory latencies for the median nerves were prolonged bilaterally, normal for the right ulnar nerve.  Nerve conduction studies were performed on both lower extremities. The distal motor latencies for the peroneal nerves were within normal limits bilaterally with a low motor amplitude on the left peroneal nerve, normal on the right. The distal motor latencies and motor amplitudes for the posterior tibial nerves were normal bilaterally. The sensory latencies for the peroneal nerves were absent bilaterally, and the H reflex latencies were prolonged bilaterally.  EMG STUDIES:  EMG study was performed on the left lower extremity:  The tibialis anterior muscle reveals 2 to 6K motor units with decreased recruitment. No fibrillations or positive waves were seen. The peroneus tertius muscle reveals 2 to 5K motor units with decreased recruitment. No fibrillations or positive waves were seen. The medial gastrocnemius muscle reveals 1 to 5K motor units with slightly decreased recruitment. No fibrillations or positive waves were seen. The vastus lateralis muscle reveals 2 to 4K  motor units with full recruitment. No fibrillations or positive waves were seen. The iliopsoas muscle reveals 2 to 4K motor units with full recruitment. No fibrillations or positive waves were seen. The biceps femoris muscle (long head) reveals 2 to 4K motor units with full recruitment. No fibrillations or positive waves were seen. The lumbosacral paraspinal muscles were tested at 3 levels, and revealed no abnormalities of insertional activity at all 3 levels tested. There was poor relaxation.   IMPRESSION:  Nerve conduction studies done on all 4 extremities shows evidence of mild bilateral carpal tunnel syndrome. There is evidence of motor and sensory impairment on nerve conduction studies on both lower extremities consistent with the diagnosis of peripheral neuropathy. EMG evaluation of the left lower extremity does show mild distal chronic denervation consistent with the diagnosis of peripheral neuropathy. No clear evidence of an overlying lumbosacral radiculopathy is seen.  Jill Alexanders MD 04/17/2015 11:26 AM  [facility] [address] [place], [place] [address]  Phone [phone] Fax [phone]

## 2015-04-17 NOTE — Progress Notes (Signed)
Please refer to EMG and nerve conduction study procedure note. 

## 2015-04-24 DIAGNOSIS — M47817 Spondylosis without myelopathy or radiculopathy, lumbosacral region: Secondary | ICD-10-CM | POA: Diagnosis not present

## 2015-04-24 DIAGNOSIS — M545 Low back pain: Secondary | ICD-10-CM | POA: Diagnosis not present

## 2015-05-13 ENCOUNTER — Other Ambulatory Visit: Payer: Self-pay | Admitting: Nurse Practitioner

## 2015-05-13 NOTE — Telephone Encounter (Signed)
Last seen 10/24/14  MMM

## 2015-05-13 NOTE — Telephone Encounter (Signed)
no more refills without being seen  

## 2015-05-14 NOTE — Telephone Encounter (Signed)
Pt aware refills were sent to pharmacy and appts need to be made. Will call back for appts

## 2015-06-05 DIAGNOSIS — H40033 Anatomical narrow angle, bilateral: Secondary | ICD-10-CM | POA: Diagnosis not present

## 2015-06-05 DIAGNOSIS — H2513 Age-related nuclear cataract, bilateral: Secondary | ICD-10-CM | POA: Diagnosis not present

## 2015-06-13 ENCOUNTER — Other Ambulatory Visit: Payer: Self-pay | Admitting: Nurse Practitioner

## 2015-06-28 ENCOUNTER — Ambulatory Visit (INDEPENDENT_AMBULATORY_CARE_PROVIDER_SITE_OTHER): Payer: Medicare Other | Admitting: Nurse Practitioner

## 2015-06-28 ENCOUNTER — Encounter: Payer: Self-pay | Admitting: Nurse Practitioner

## 2015-06-28 VITALS — BP 136/82 | HR 58 | Temp 97.0°F | Ht 74.0 in | Wt 254.0 lb

## 2015-06-28 DIAGNOSIS — J4520 Mild intermittent asthma, uncomplicated: Secondary | ICD-10-CM

## 2015-06-28 DIAGNOSIS — E785 Hyperlipidemia, unspecified: Secondary | ICD-10-CM | POA: Diagnosis not present

## 2015-06-28 DIAGNOSIS — Z0000 Encounter for general adult medical examination without abnormal findings: Secondary | ICD-10-CM | POA: Diagnosis not present

## 2015-06-28 DIAGNOSIS — K219 Gastro-esophageal reflux disease without esophagitis: Secondary | ICD-10-CM | POA: Diagnosis not present

## 2015-06-28 DIAGNOSIS — F329 Major depressive disorder, single episode, unspecified: Secondary | ICD-10-CM | POA: Diagnosis not present

## 2015-06-28 DIAGNOSIS — Z8546 Personal history of malignant neoplasm of prostate: Secondary | ICD-10-CM

## 2015-06-28 DIAGNOSIS — F32A Depression, unspecified: Secondary | ICD-10-CM

## 2015-06-28 DIAGNOSIS — Z9861 Coronary angioplasty status: Secondary | ICD-10-CM | POA: Diagnosis not present

## 2015-06-28 DIAGNOSIS — R5383 Other fatigue: Secondary | ICD-10-CM | POA: Diagnosis not present

## 2015-06-28 DIAGNOSIS — E559 Vitamin D deficiency, unspecified: Secondary | ICD-10-CM | POA: Diagnosis not present

## 2015-06-28 DIAGNOSIS — Z23 Encounter for immunization: Secondary | ICD-10-CM

## 2015-06-28 DIAGNOSIS — I10 Essential (primary) hypertension: Secondary | ICD-10-CM

## 2015-06-28 MED ORDER — MONTELUKAST SODIUM 10 MG PO TABS: ORAL_TABLET | ORAL | Status: DC

## 2015-06-28 MED ORDER — CITALOPRAM HYDROBROMIDE 20 MG PO TABS: 20.0000 mg | ORAL_TABLET | Freq: Every day | ORAL | Status: DC

## 2015-06-28 MED ORDER — ATENOLOL 25 MG PO TABS: 25.0000 mg | ORAL_TABLET | Freq: Every day | ORAL | Status: DC

## 2015-06-28 MED ORDER — AMLODIPINE BESYLATE 5 MG PO TABS: 5.0000 mg | ORAL_TABLET | Freq: Every day | ORAL | Status: DC

## 2015-06-28 NOTE — Patient Instructions (Signed)

## 2015-06-28 NOTE — Progress Notes (Signed)
Subjective:    Patient ID: [name] [name], male    DOB: [birth year], 69 y.o.   MRN: [id number]  Patient here today for follow up of chronic medical problems.  Hyperlipidemia This is a chronic problem. The current episode started more than 1 year ago. Recent lipid tests were reviewed and are variable. Pertinent negatives include no chest pain, myalgias or shortness of breath. Current antihyperlipidemic treatment includes statins. The current treatment provides moderate improvement of lipids. Compliance problems include adherence to diet and adherence to exercise.  Risk factors for coronary artery disease include dyslipidemia, hypertension, obesity and male sex.  Hypertension This is a chronic problem. The current episode started more than 1 year ago. The problem has been waxing and waning since onset. The problem is uncontrolled. Pertinent negatives include no chest pain, headaches, palpitations or shortness of breath. Risk factors for coronary artery disease include dyslipidemia and obesity. Past treatments include beta blockers and calcium channel blockers. The current treatment provides moderate improvement. Compliance problems include diet and exercise.  Hypertensive end-organ damage includes CAD/MI. There is no history of CVA.  Depression Pt takes Celexa 20 mg-Well controlled Asthma  Pt states his asthma is controlled- Pt states he uses his rescue inhaler 3-4 times a month-when he works outside- Also takes singulair 10 mg daily CAD/Angioplasty Patient sees cardiologist every 6 months- no c/o chest pain    Review of Systems  Respiratory: Negative for shortness of breath.   Cardiovascular: Negative for chest pain and palpitations.  Musculoskeletal: Negative for myalgias.  Neurological: Positive for dizziness and light-headedness. Negative for headaches.  All other systems reviewed and are negative.      Objective:   Physical Exam  Constitutional: He is oriented to person, place, and  time. He appears well-developed and well-nourished.  HENT:  Head: Normocephalic and atraumatic.  Right Ear: External ear normal.  Left Ear: External ear normal.  Nose: Nose normal.  Mouth/Throat: Oropharynx is clear and moist.  Eyes: EOM are normal. Pupils are equal, round, and reactive to light.  Neck: Normal range of motion. Neck supple. No thyromegaly present.  Cardiovascular: Normal rate, regular rhythm, normal heart sounds and intact distal pulses.   Pulmonary/Chest: Effort normal and breath sounds normal. No respiratory distress. He has no wheezes.  Abdominal: Soft. Bowel sounds are normal.  Musculoskeletal: Normal range of motion. He exhibits no edema or tenderness.  Pt has peripheral numbness present in bilateral feet  Neurological: He is alert and oriented to person, place, and time.  Skin: Skin is warm and dry.  Psychiatric: He has a normal mood and affect. His behavior is normal. Judgment and thought content normal.  Vitals reviewed.  BP 136/82 mmHg  Pulse 58  Temp(Src) 97 F (36.1 C) (Oral)  Ht 6' 2" (1.88 m)  Wt 254 lb (115.214 kg)  BMI 32.60 kg/m2    Assessment & Plan:   1. Annual physical exam - Vit D  25 hydroxy (rtn osteoporosis monitoring) - Vitamin B12  2. Asthma, chronic, mild intermittent, uncomplicated - montelukast (SINGULAIR) 10 MG tablet; Take 1 tablet (10 mg total) by mouth daily.  Dispense: 90 tablet; Refill: 1  3. Gastroesophageal reflux disease, esophagitis presence not specified Avoid spicy foods Do not eat 2 hours prior to bedtime   4. Hyperlipemia Low fat diet Refuses to take statin - Lipid panel  5. Depression Stress management - citalopram (CELEXA) 20 MG tablet; Take 1 tablet (20 mg total) by mouth daily.  Dispense: 90 tablet; Refill: 1  6. PERCUTANEOUS TRANSLUMINAL CORONARY ANGIOPLASTY, HX OF Keep follow up appointments with cardiologist  7. Essential hypertension Do not add salt to diet - atenolol (TENORMIN) 25 MG tablet; Take  1 tablet (25 mg total) by mouth daily.  Dispense: 180 tablet; Refill: 1 - amLODipine (NORVASC) 5 MG tablet; Take 1 tablet (5 mg total) by mouth daily.  Dispense: 90 tablet; Refill: 1 - CMP14+EGFR  8. H/O prostate cancer - PSA, total and free    Labs pending Health maintenance reviewed Diet and exercise encouraged Continue all meds Follow up  In 3 months   [place], FNP

## 2015-06-28 NOTE — Addendum Note (Signed)
Addended by: Rolena Infante on: 06/28/2015 05:19 PM   Modules accepted: Orders

## 2015-06-29 LAB — CMP14+EGFR
ALT: 18 IU/L (ref 0–44)
AST: 20 IU/L (ref 0–40)
Albumin/Globulin Ratio: 1.4 (ref 1.1–2.5)
Albumin: 4.4 g/dL (ref 3.6–4.8)
Alkaline Phosphatase: 71 IU/L (ref 39–117)
BUN/Creatinine Ratio: 16 (ref 10–22)
BUN: 19 mg/dL (ref 8–27)
Bilirubin Total: 0.4 mg/dL (ref 0.0–1.2)
CO2: 26 mmol/L (ref 18–29)
Calcium: 10.2 mg/dL (ref 8.6–10.2)
Chloride: 97 mmol/L (ref 97–108)
Creatinine, Ser: 1.19 mg/dL (ref 0.76–1.27)
GFR calc Af Amer: 72 mL/min/{1.73_m2} (ref >59)
GFR calc non Af Amer: 62 mL/min/{1.73_m2} (ref >59)
Globulin, Total: 3.1 g/dL (ref 1.5–4.5)
Glucose: 105 mg/dL — ABNORMAL HIGH (ref 65–99)
Potassium: 4.8 mmol/L (ref 3.5–5.2)
Sodium: 139 mmol/L (ref 134–144)
Total Protein: 7.5 g/dL (ref 6.0–8.5)

## 2015-06-29 LAB — LIPID PANEL
Chol/HDL Ratio: 6.4 ratio units — ABNORMAL HIGH (ref 0.0–5.0)
Cholesterol, Total: 254 mg/dL — ABNORMAL HIGH (ref 100–199)
HDL: 40 mg/dL (ref >39)
LDL Calculated: 151 mg/dL — ABNORMAL HIGH (ref 0–99)
Triglycerides: 314 mg/dL — ABNORMAL HIGH (ref 0–149)
VLDL Cholesterol Cal: 63 mg/dL — ABNORMAL HIGH (ref 5–40)

## 2015-06-29 LAB — PSA, TOTAL AND FREE
PSA, Free Pct: >10 %
PSA, Free: 0.01 ng/mL
Prostate Specific Ag, Serum: <0.1 ng/mL (ref 0.0–4.0)

## 2015-06-29 LAB — VITAMIN D 25 HYDROXY (VIT D DEFICIENCY, FRACTURES): Vit D, 25-Hydroxy: 28.7 ng/mL — ABNORMAL LOW (ref 30.0–100.0)

## 2015-06-29 LAB — VITAMIN B12: Vitamin B-12: 778 pg/mL (ref 211–946)

## 2015-07-24 DIAGNOSIS — M79642 Pain in left hand: Secondary | ICD-10-CM | POA: Diagnosis not present

## 2015-07-24 DIAGNOSIS — M79641 Pain in right hand: Secondary | ICD-10-CM | POA: Diagnosis not present

## 2015-07-24 DIAGNOSIS — M170 Bilateral primary osteoarthritis of knee: Secondary | ICD-10-CM | POA: Diagnosis not present

## 2015-08-28 ENCOUNTER — Ambulatory Visit: Admitting: Cardiology

## 2015-08-28 ENCOUNTER — Encounter: Payer: Self-pay | Admitting: Pediatrics

## 2015-08-28 ENCOUNTER — Ambulatory Visit (INDEPENDENT_AMBULATORY_CARE_PROVIDER_SITE_OTHER): Payer: Medicare Other | Admitting: Pediatrics

## 2015-08-28 VITALS — BP 142/74 | HR 67 | Temp 97.7°F | Ht 74.0 in | Wt 266.4 lb

## 2015-08-28 DIAGNOSIS — R0683 Snoring: Secondary | ICD-10-CM | POA: Diagnosis not present

## 2015-08-28 DIAGNOSIS — J069 Acute upper respiratory infection, unspecified: Secondary | ICD-10-CM

## 2015-08-28 DIAGNOSIS — I10 Essential (primary) hypertension: Secondary | ICD-10-CM | POA: Diagnosis not present

## 2015-08-28 DIAGNOSIS — H1132 Conjunctival hemorrhage, left eye: Secondary | ICD-10-CM

## 2015-08-28 NOTE — Progress Notes (Addendum)
Subjective:    Patient ID: [name] [name], male    DOB: 1945/10/07, 69 y.o.   MRN: [id number]  CC: Left Eye is red  HPI: [name] [name] is a 69 y.o. male presenting for Left Eye  Woke up this morning with the white aprt of L eye very red Felt some pressure in his L eye slightly this morning, none now No pressure now No trauma or concussions He has been coughing more than usual with recent URI Has been taking dayquil for recent URI, dayquil did help BPs at home 130s usually  Snores at night, is uneven, wakes himself up at night sometimes, never had a sleep study No headaches, vision changes Appetite has been normal    Depression screen [facility] 2/9 08/28/2015 06/28/2015 08/02/2014 01/11/2014 06/02/2013  Decreased Interest 0 0 2 0 0  Down, Depressed, Hopeless 0 0 0 0 0  PHQ - 2 Score 0 0 2 0 0  Altered sleeping - - 3 - -  Tired, decreased energy - - 3 - -  Change in appetite - - 0 - -  Feeling bad or failure about yourself  - - 0 - -  Trouble concentrating - - 1 - -  Moving slowly or fidgety/restless - - 0 - -  Suicidal thoughts - - 0 - -  PHQ-9 Score - - 9 - -     Relevant past medical, surgical, family and social history reviewed and updated as indicated. Interim medical history since our last visit reviewed. Allergies and medications reviewed and updated.    ROS: Per HPI unless specifically indicated above  History  Smoking status  . Former Smoker -- 1.00 packs/day for 20 years  . Types: Cigarettes  . Quit date: 01/19/1996  Smokeless tobacco  . Never Used    Past Medical History Patient Active Problem List   Diagnosis Date Noted  . Essential hypertension 06/28/2015  . H/O prostate cancer 06/28/2015  . Hereditary and idiopathic peripheral neuropathy 04/17/2015  . Carpal tunnel syndrome 04/17/2015  . Hyperlipemia 06/02/2013  . Asthma, chronic 06/02/2013  . Depression 06/02/2013  . GERD 06/04/2009  . LEG PAIN, BILATERAL 06/04/2009  . DYSPNEA ON EXERTION  06/04/2009  . PERCUTANEOUS TRANSLUMINAL CORONARY ANGIOPLASTY, HX OF 06/04/2009         Objective:    BP 142/74 mmHg  Pulse 67  Temp(Src) 97.7 F (36.5 C) (Oral)  Ht 6\' 2"  (1.88 m)  Wt 266 lb 6.4 oz (120.838 kg)  BMI 34.19 kg/m2  Wt Readings from Last 3 Encounters:  08/28/15 266 lb 6.4 oz (120.838 kg)  06/28/15 254 lb (115.214 kg)  10/24/14 263 lb (119.296 kg)    Gen: NAD, alert, cooperative with exam, NCAT EYES: L eye with conjunctival hemorrhage. PERRL, EOMI, no pain with movement of eyes. No redness or swelling around eyes.  ENT:  TMs pearly gray b/l, OP without erythema LYMPH: no cervical LAD CV: NRRR, normal S1/S2, no murmur, distal pulses 2+ b/l Resp: CTABL, no wheezes, normal WOB Abd: +BS, soft, NTND. no guarding or organomegaly Ext: No edema, warm Neuro: Alert and oriented, strength equal b/l UE and LE, coordination grossly normal MSK: normal muscle bulk     Assessment & Plan:    Col[place] was seen today for Conjunctival hemorrhage of left eye and multiple other med problems. Eye should clear over the next few days. Return precautions given.   Snoring Pt also has been snoring regularly, waking himself up at night at times,  wife says snoring very uneven. Has not had sleep study before, will refer for eval of sleep apnea. -     Ambulatory referral to Pulmonology  Acute URI Discussed symptomatic care.  Essential hypertension Slightly elevated today. Continue to monitor at home. Recheck next visit.   Follow up plan: As scheduled  Assunta Found, MD [facility] 08/28/2015, 10:27 AM

## 2015-08-29 ENCOUNTER — Telehealth: Payer: Self-pay | Admitting: Cardiology

## 2015-08-29 NOTE — Telephone Encounter (Signed)
Called pt and left a message informing pt to give our office a call back to update fx hx.

## 2015-09-01 NOTE — Addendum Note (Signed)
Addended by: Eustaquio Maize on: 09/01/2015 04:06 PM   Modules accepted: Level of Service

## 2015-09-02 DIAGNOSIS — E291 Testicular hypofunction: Secondary | ICD-10-CM | POA: Diagnosis not present

## 2015-09-02 DIAGNOSIS — Z8546 Personal history of malignant neoplasm of prostate: Secondary | ICD-10-CM | POA: Diagnosis not present

## 2015-09-02 DIAGNOSIS — N5201 Erectile dysfunction due to arterial insufficiency: Secondary | ICD-10-CM | POA: Diagnosis not present

## 2015-09-03 ENCOUNTER — Ambulatory Visit (INDEPENDENT_AMBULATORY_CARE_PROVIDER_SITE_OTHER): Payer: Medicare Other | Admitting: Cardiology

## 2015-09-03 ENCOUNTER — Encounter: Payer: Self-pay | Admitting: Cardiology

## 2015-09-03 VITALS — BP 122/64 | HR 58 | Ht 74.0 in | Wt 267.0 lb

## 2015-09-03 DIAGNOSIS — I480 Paroxysmal atrial fibrillation: Secondary | ICD-10-CM

## 2015-09-03 DIAGNOSIS — E785 Hyperlipidemia, unspecified: Secondary | ICD-10-CM | POA: Diagnosis not present

## 2015-09-03 DIAGNOSIS — I10 Essential (primary) hypertension: Secondary | ICD-10-CM

## 2015-09-03 NOTE — Progress Notes (Signed)
Patient ID: MONG FUSON, male   DOB: 1946-01-31, 69 y.o.   MRN: [id number] Patient ID: VINIT GILLS, male   DOB: [birth year], 69 y.o.   MRN: [id number]    Patient Name: ZAVIYAR VIRELLA Date of Encounter: 09/03/2015  Primary Care Provider:  Chevis Pretty, FNP Primary Cardiologist:  Ena Dawley H   Patient Profile  Atrial fibrillation  Problem List   Past Medical History  Diagnosis Date  . GERD (gastroesophageal reflux disease)   . Primary malignant neoplasm of prostate identified by needle biopsy (T1c) ([place])   . Hyperlipemia 06/02/2013  . Asthma, chronic 06/02/2013  . Depression 06/02/2013  . DYSPNEA ON EXERTION 06/04/2009    Qualifier: Diagnosis of  By: Domenic Polite, MD, Phillips Hay   . LEG PAIN, BILATERAL 06/04/2009    Qualifier: Diagnosis of  By: Domenic Polite, MD, Phillips Hay   . PERCUTANEOUS TRANSLUMINAL CORONARY ANGIOPLASTY, HX OF 06/04/2009    Qualifier: Diagnosis of  By: Domenic Polite, MD, Phillips Hay   . Hereditary and idiopathic peripheral neuropathy 04/17/2015  . Carpal tunnel syndrome 04/17/2015    Bilateral   Past Surgical History  Procedure Laterality Date  . Hiatal hernia repair    . Laparoscopic gastric banding  2000    [facility]   . Cardiac catheterization  1997  . Robot assisted laparoscopic radical prostatectomy      Allergies  No Known Allergies  HPI  69 year old male with a history of COPD, hyperlipidemia, prior history of prostate cancer now in remission who was referred to [place] for a finding of atrial fibrillation but his primary care physician. The patient was previously very active is about 10 years ago he was running to 3 miles daily, afterwards he started to have joint problems and had multiple surgeries that led to significant deconditioning. The patient is stating that for at least the last year he felt significant shortness of breath, he walks every day 20 minutes to 1 hour any incline makes him gasp for breath. He denies any  palpitations. The patient is complaining of dizzy spells that has been increasing in frequency and he had one episode of near-syncope, after he was walking his dog. The patient is not aware of palpitations at that time. The patient isn't denies symptoms of orthopnea, paroxysmal nocturnal dyspnea, lower extremity edema. Six week follow up, no palpitations, didn't tolerate e cardio well, only wore it for 8 days.  09/03/2015 - 1 year follow-up. The patient denies any palpitations or syncope. He stays active, however doesn't exercise and eats out and eats fried food a lot. No LE edema, orthopnea, PND. He denies any chest pain or shortness of breath. He is complaint with his meds.  Home Medications  Prior to Admission medications   Medication Sig Start Date End Date Taking? Authorizing Provider  budesonide-formoterol (SYMBICORT) 160-4.5 MCG/ACT inhaler Inhale 2 puffs into the lungs 2 (two) times daily. 06/02/13   Mary-Margaret Hassell Done, FNP  citalopram (CELEXA) 20 MG tablet Take 1 tablet (20 mg total) by mouth daily. 06/02/13   Mary-Margaret Hassell Done, FNP  fenofibrate 160 MG tablet Take 1 tablet (160 mg total) by mouth daily. 06/02/13   Mary-Margaret Hassell Done, FNP  montelukast (SINGULAIR) 10 MG tablet Take 1 tablet (10 mg total) by mouth at bedtime. 06/02/13   Mary-Margaret Hassell Done, FNP  Naproxen Sodium (NAPRELAN) 750 MG TB24 Take 750 mg by mouth daily.    Historical Provider, MD  simvastatin (ZOCOR) 40 MG tablet Take 1 tablet (40 mg total) by mouth  at bedtime. 06/02/13   Mary-Margaret Hassell Done, FNP    Family History  Family History  Problem Relation Age of Onset  . Heart disease      Early Cardiac disease for uncle is their 4's as well as deceased.    Social History  Social History   Social History  . Marital Status: Married    Spouse Name: N/A  . Number of Children: N/A  . Years of Education: N/A   Occupational History  . Not on file.   Social History Main Topics  . Smoking status: Former  Smoker -- 1.00 packs/day for 20 years    Types: Cigarettes    Quit date: 01/19/1996  . Smokeless tobacco: Never Used  . Alcohol Use: Yes  . Drug Use: No  . Sexual Activity: Not on file   Other Topics Concern  . Not on file   Social History Narrative     Review of Systems, as per history of present illness otherwise negative General:  No chills, fever, night sweats or weight changes.  Cardiovascular:  No chest pain, dyspnea on exertion, edema, orthopnea, palpitations, paroxysmal nocturnal dyspnea. Dermatological: No rash, lesions/masses Respiratory: No cough, dyspnea Urologic: No hematuria, dysuria Abdominal:   No nausea, vomiting, diarrhea, bright red blood per rectum, melena, or hematemesis Neurologic:  No visual changes, wkns, changes in mental status. All other systems reviewed and are otherwise negative except as noted above.  Physical Exam  BP 142/70, HR 70 BPM General: Pleasant, NAD Psych: Normal affect. Neuro: Alert and oriented X 3. Moves all extremities spontaneously. HEENT: Normal  Neck: Supple without bruits or JVD. Lungs:  Resp regular and unlabored, CTA. Heart: RRR no s3, s4, or murmurs. Abdomen: Soft, non-tender, non-distended, BS + x 4.  Extremities: No clubbing, cyanosis or edema. DP/PT/Radials 2+ and equal bilaterally.  Accessory Clinical Findings  ECG - sinus rhythm 61 beats per minute, normal EKG   TTE:  Left ventricle: The cavity size was normal. Wall thickness was increased in a pattern of moderate LVH. Systolic function was normal. The estimated ejection fraction was in the range of 50% to 55%. Wall motion was normal; there were no regional wall motion abnormalities. Doppler parameters are consistent with abnormal left ventricular relaxation (grade 1 diastolic dysfunction).  ------------------------------------------------------------ Aortic valve: Trileaflet; normal thickness leaflets. Mobility was not restricted. Doppler:  Transvalvular velocity was within the normal range. There was no stenosis. No regurgitation.  ------------------------------------------------------------ Aorta: Aortic root: The aortic root was normal in size.  ------------------------------------------------------------ Mitral valve: Structurally normal valve. Mobility was not restricted. Doppler: Transvalvular velocity was within the normal range. There was no evidence for stenosis. No regurgitation. Peak gradient: 65mm Hg (D).  ------------------------------------------------------------ Left atrium: The atrium was mildly dilated.  ------------------------------------------------------------ Right ventricle: The cavity size was normal. Systolic function was normal.  ------------------------------------------------------------ Pulmonic valve: Doppler: Transvalvular velocity was within the normal range. There was no evidence for stenosis.  ------------------------------------------------------------ Tricuspid valve: Structurally normal valve. Doppler: Transvalvular velocity was within the normal range. No regurgitation.  ------------------------------------------------------------ Right atrium: The atrium was normal in size.  ------------------------------------------------------------ Pericardium: There was no pericardial effusion.  ------------------------------------------------------------ Systemic veins: Inferior vena cava: The vessel was normal in size.  EKG- sinus bradycardia, otherwise normal   Assessment & Plan  69 year old male  1. New diagnosis of paroxysmal atrial fibrillation in 2014 - now in [place]. E-cardio monitor showed 0% a-fib.  No palpitations meanwhile.  Echocardiogram shows mild LA enlargement. On aspirin 81 mg po daily for now. TSH normal.  2. Hypertension - controlled now, echo shows moderate LVH, BP now controlled with amlodipine to 5 mg po daily and atenolol 25 mg daily, avoid ACEI or ARB due to  renal impairment.  3. Hyperlipidemia - intolerant to simva- and atorvastatin, adamant about not taking other statin, he is motivated to start exercising, change diet, we will also add red yeast rice.  Follow up in 1 year with CMP and lipids prior to the visit.  Dorothy Spark, MD 09/03/2015, 10:40 AM

## 2015-09-03 NOTE — Patient Instructions (Signed)
Medication Instructions:   Your physician recommends that you continue on your current medications as directed. Please refer to the Current Medication list given to you today.   Labwork:  PRIOR TO YOUR ONE YEAR FOLLOW-UP WITH DR [name] TO CHECK A ---CMET AND LIPIDS---PLEASE COME FASTING TO THIS LAB APPOINTMENT    Follow-Up:  Your physician wants you to follow-up in: [place] will receive a reminder letter in the mail two months in advance. If you don't receive a letter, please call our office to schedule the follow-up appointment.  PLEASE HAVE YOUR LABS DONE PRIOR TO THIS APPOINMENT     If you need a refill on your cardiac medications before your next appointment, please call your pharmacy.

## 2015-09-23 ENCOUNTER — Other Ambulatory Visit: Payer: Self-pay | Admitting: Nurse Practitioner

## 2015-09-24 DIAGNOSIS — L814 Other melanin hyperpigmentation: Secondary | ICD-10-CM | POA: Diagnosis not present

## 2015-09-24 DIAGNOSIS — L820 Inflamed seborrheic keratosis: Secondary | ICD-10-CM | POA: Diagnosis not present

## 2015-09-24 DIAGNOSIS — Z85828 Personal history of other malignant neoplasm of skin: Secondary | ICD-10-CM | POA: Diagnosis not present

## 2015-09-24 DIAGNOSIS — L821 Other seborrheic keratosis: Secondary | ICD-10-CM | POA: Diagnosis not present

## 2015-09-24 DIAGNOSIS — L579 Skin changes due to chronic exposure to nonionizing radiation, unspecified: Secondary | ICD-10-CM | POA: Diagnosis not present

## 2015-10-01 ENCOUNTER — Institutional Professional Consult (permissible substitution): Payer: Medicare Other | Admitting: Pulmonary Disease

## 2015-10-31 ENCOUNTER — Encounter: Payer: Self-pay | Admitting: Nurse Practitioner

## 2015-10-31 ENCOUNTER — Ambulatory Visit (INDEPENDENT_AMBULATORY_CARE_PROVIDER_SITE_OTHER): Payer: Medicare Other | Admitting: Nurse Practitioner

## 2015-10-31 VITALS — BP 155/70 | HR 79 | Temp 100.4°F | Ht 74.0 in | Wt 267.0 lb

## 2015-10-31 DIAGNOSIS — Z20828 Contact with and (suspected) exposure to other viral communicable diseases: Secondary | ICD-10-CM | POA: Diagnosis not present

## 2015-10-31 DIAGNOSIS — R0989 Other specified symptoms and signs involving the circulatory and respiratory systems: Secondary | ICD-10-CM

## 2015-10-31 LAB — POCT INFLUENZA A/B
Influenza A, POC: NEGATIVE
Influenza B, POC: NEGATIVE

## 2015-10-31 MED ORDER — OSELTAMIVIR PHOSPHATE 75 MG PO CAPS: 75.0000 mg | ORAL_CAPSULE | Freq: Two times a day (BID) | ORAL | Status: DC

## 2015-10-31 MED ORDER — HYDROCODONE-HOMATROPINE 5-1.5 MG/5ML PO SYRP: 5.0000 mL | ORAL_SOLUTION | Freq: Four times a day (QID) | ORAL | Status: DC | PRN

## 2015-10-31 NOTE — Addendum Note (Signed)
Addended by: Chevis Pretty on: 10/31/2015 12:43 PM   Modules accepted: Orders

## 2015-10-31 NOTE — Progress Notes (Signed)
   Subjective:    Patient ID: [name] [name], male    DOB: 13-May-1946, 70 y.o.   MRN: [id number]  HPI Patient in c/o fever, achiness cough and congestion- Started yesterday- wife tested positive for influenza yesterday.    Review of Systems  Constitutional: Positive for fever.  HENT: Positive for congestion.   Respiratory: Positive for cough.   Cardiovascular: Negative.   Genitourinary: Negative.   Neurological: Negative.   Psychiatric/Behavioral: Negative.   All other systems reviewed and are negative.      Objective:   Physical Exam  Constitutional: He appears well-developed and well-nourished.  HENT:  Right Ear: Hearing, tympanic membrane, external ear and ear canal normal.  Left Ear: Hearing, tympanic membrane, external ear and ear canal normal.  Nose: Mucosal edema and rhinorrhea present. Right sinus exhibits no maxillary sinus tenderness and no frontal sinus tenderness. Left sinus exhibits no maxillary sinus tenderness and no frontal sinus tenderness.  Mouth/Throat: Uvula is midline, oropharynx is clear and moist and mucous membranes are normal.  Eyes: Pupils are equal, round, and reactive to light.  Neck: Normal range of motion. Neck supple.  Cardiovascular: Normal rate, regular rhythm and normal heart sounds.   Pulmonary/Chest: Effort normal and breath sounds normal.  Skin: Skin is warm.  Psychiatric: He has a normal mood and affect. His behavior is normal. Judgment and thought content normal.   BP 155/70 mmHg  Pulse 79  Temp(Src) 100.4 F (38 C) (Oral)  Ht 6\' 2"  (1.88 m)  Wt 267 lb (121.11 kg)  BMI 34.27 kg/m2          Assessment & Plan:   1. Chest congestion   2. Exposure to the flu    Meds ordered this encounter  Medications  . oseltamivir (TAMIFLU) 75 MG capsule    Sig: Take 1 capsule (75 mg total) by mouth 2 (two) times daily.    Dispense:  10 capsule    Refill:  0    Order Specific Question:  Supervising Provider    Answer:  Chipper Herb  [[id number]]   1. Take meds as prescribed 2. Use a cool mist humidifier especially during the winter months and when heat has been humid. 3. Use saline nose sprays frequently 4. Saline irrigations of the nose can be very helpful if done frequently.  * 4X daily for 1 week*  * Use of a nettie pot can be helpful with this. Follow directions with this* 5. Drink plenty of fluids 6. Keep thermostat turn down low 7.For any cough or congestion  Use plain Mucinex- regular strength or max strength is fine   * Children- consult with Pharmacist for dosing 8. For fever or aces or pains- take tylenol or ibuprofen appropriate for age and weight.  * for fevers greater than 101 orally you may alternate ibuprofen and tylenol every  3 hours.   Mary-Margaret Hassell Done, FNP

## 2015-10-31 NOTE — Patient Instructions (Signed)

## 2015-11-22 ENCOUNTER — Encounter: Payer: Self-pay | Admitting: *Deleted

## 2016-01-01 DIAGNOSIS — M170 Bilateral primary osteoarthritis of knee: Secondary | ICD-10-CM | POA: Diagnosis not present

## 2016-01-01 DIAGNOSIS — M1712 Unilateral primary osteoarthritis, left knee: Secondary | ICD-10-CM | POA: Diagnosis not present

## 2016-01-13 ENCOUNTER — Other Ambulatory Visit: Payer: Self-pay | Admitting: Nurse Practitioner

## 2016-01-29 DIAGNOSIS — M170 Bilateral primary osteoarthritis of knee: Secondary | ICD-10-CM | POA: Diagnosis not present

## 2016-03-02 ENCOUNTER — Other Ambulatory Visit: Payer: Self-pay | Admitting: Nurse Practitioner

## 2016-03-04 DIAGNOSIS — M1712 Unilateral primary osteoarthritis, left knee: Secondary | ICD-10-CM | POA: Diagnosis not present

## 2016-03-04 DIAGNOSIS — M1711 Unilateral primary osteoarthritis, right knee: Secondary | ICD-10-CM | POA: Diagnosis not present

## 2016-04-13 ENCOUNTER — Other Ambulatory Visit: Payer: Self-pay | Admitting: Nurse Practitioner

## 2016-05-08 ENCOUNTER — Other Ambulatory Visit: Payer: Self-pay | Admitting: Nurse Practitioner

## 2016-05-08 DIAGNOSIS — I10 Essential (primary) hypertension: Secondary | ICD-10-CM

## 2016-05-19 DIAGNOSIS — L821 Other seborrheic keratosis: Secondary | ICD-10-CM | POA: Diagnosis not present

## 2016-05-19 DIAGNOSIS — L579 Skin changes due to chronic exposure to nonionizing radiation, unspecified: Secondary | ICD-10-CM | POA: Diagnosis not present

## 2016-05-19 DIAGNOSIS — L814 Other melanin hyperpigmentation: Secondary | ICD-10-CM | POA: Diagnosis not present

## 2016-05-19 DIAGNOSIS — Z85828 Personal history of other malignant neoplasm of skin: Secondary | ICD-10-CM | POA: Diagnosis not present

## 2016-05-19 DIAGNOSIS — L820 Inflamed seborrheic keratosis: Secondary | ICD-10-CM | POA: Diagnosis not present

## 2016-05-30 ENCOUNTER — Other Ambulatory Visit: Payer: Self-pay | Admitting: Nurse Practitioner

## 2016-06-01 NOTE — Telephone Encounter (Signed)
Last refill without being seen 

## 2016-06-17 ENCOUNTER — Other Ambulatory Visit: Payer: Self-pay | Admitting: Nurse Practitioner

## 2016-06-17 ENCOUNTER — Encounter (INDEPENDENT_AMBULATORY_CARE_PROVIDER_SITE_OTHER): Payer: Medicare Other | Admitting: Physical Medicine and Rehabilitation

## 2016-06-17 DIAGNOSIS — M47817 Spondylosis without myelopathy or radiculopathy, lumbosacral region: Secondary | ICD-10-CM | POA: Diagnosis not present

## 2016-06-19 ENCOUNTER — Ambulatory Visit: Payer: Medicare Other | Admitting: Pediatrics

## 2016-07-07 ENCOUNTER — Telehealth: Payer: Self-pay | Admitting: *Deleted

## 2016-07-07 ENCOUNTER — Ambulatory Visit (INDEPENDENT_AMBULATORY_CARE_PROVIDER_SITE_OTHER): Payer: Medicare Other | Admitting: *Deleted

## 2016-07-07 VITALS — BP 138/66 | HR 56 | Ht 74.0 in | Wt 262.0 lb

## 2016-07-07 DIAGNOSIS — Z23 Encounter for immunization: Secondary | ICD-10-CM | POA: Diagnosis not present

## 2016-07-07 DIAGNOSIS — Z0000 Encounter for general adult medical examination without abnormal findings: Secondary | ICD-10-CM

## 2016-07-07 NOTE — Patient Instructions (Addendum)
   [name] [name] ,  Thank you for taking time to come for your Medicare Wellness Visit. I appreciate your ongoing commitment to your health goals. Please review the following plan we discussed and let me know if I can assist you in the future.   These are the goals we discussed: Goals    . Exercise 3x per week (30 min per time)          Continue to walk 30 min daily       This is a list of the screening recommended for you and due dates:  Health Maintenance  Topic Date Due  .  Hepatitis C: One time screening is recommended by Center for Disease Control  (CDC) for  adults born from [birth year] through 1965.   1946/02/08  . Shingles Vaccine  09/01/2006  . Flu Shot  04/14/2016  . Tetanus Vaccine  06/14/2016  . Pneumonia vaccines (2 of 2 - PPSV23) 06/27/2016  . Colon Cancer Screening  12/14/2019

## 2016-07-07 NOTE — Telephone Encounter (Signed)
Ok to taper off but do so slowly- start off with one every other day for 2 weeks then go from there.

## 2016-07-07 NOTE — Telephone Encounter (Signed)
Patient informed to take Celexa every other day for 2 weeks and then he should be able to discontinue.  Advised patient to contact us if he felt any abnormal symptoms such as dizziness, lightheadedness and confusion.

## 2016-07-07 NOTE — Progress Notes (Signed)
Subjective:   [name] [name] is a 70 y.o. male who presents for an Initial Medicare Annual Wellness Visit. 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 He lives at home with his wife and they have a dog as a pet. They have 3 adult children, 2 boys and 1 girl and 5 grandchildren. He enjoys working in his yard and stays active around his home. He is typically physically active 2-3 hours a day. He normally walks about 30 minutes daily.   Review of Systems   Cardiac Risk Factors include: advanced age (>34men, >81 women);hypertension;male gender;obesity (BMI >30kg/m2);dyslipidemia   Musculoskeletal: Chronic left knee pain s/p meniscal injury and repair and low back pain. Patient sees Dr Rush Farmer for her knee and Dr Lucia Gaskins and Dr Louanne Skye for his back. He receives epidural injections 1-2 times a year which is helpful.  Other systems negative.   Patient reports that health is the same as last year.     Objective:     BP 138/66 (BP Location: Left Arm, Patient Position: Sitting, Cuff Size: Large)   Pulse (!) 56   Ht 6\' 2"  (1.88 m)   Wt 262 lb (118.8 kg)   BMI 33.64 kg/m   Body mass index is 33.64 kg/m.  Current Medications (verified) Outpatient Encounter Prescriptions as of 07/07/2016  Medication Sig  . amLODipine (NORVASC) 5 MG tablet Take 1 tablet (5 mg total) by mouth daily.  Marland Kitchen aspirin EC 81 MG tablet Take 81 mg by mouth daily.  Marland Kitchen atenolol (TENORMIN) 25 MG tablet TAKE 1 TABLET ONCE DAILY  . budesonide-formoterol (SYMBICORT) 160-4.5 MCG/ACT inhaler Inhale 2 puffs into the lungs 2 (two) times daily.  . cetirizine (ZYRTEC) 10 MG tablet Take 10 mg by mouth daily.  . cholecalciferol (VITAMIN D) 1000 UNITS tablet Take 1,000 Units by mouth daily.  . citalopram (CELEXA) 20 MG tablet Take 1 tablet (20 mg total) by mouth daily.  . cyanocobalamin (,VITAMIN B-12,) 1000 MCG/ML injection INJECT 1ML EVERY MONTH  . loperamide (IMODIUM) 2 MG  capsule Take 2 mg by mouth daily.  . montelukast (SINGULAIR) 10 MG tablet Take 1 tablet (10 mg total) by mouth daily.  . Naproxen Sodium (ALEVE) 220 MG CAPS Take 220 mg by mouth 2 (two) times daily.  . [DISCONTINUED] HYDROcodone-homatropine (HYCODAN) 5-1.5 MG/5ML syrup Take 5 mLs by mouth every 6 (six) hours as needed for cough.  . [DISCONTINUED] Multiple Vitamin (MULTIVITAMIN WITH MINERALS) TABS tablet Take 1 tablet by mouth daily.  . [DISCONTINUED] naproxen (NAPROSYN) 500 MG tablet   . [DISCONTINUED] oseltamivir (TAMIFLU) 75 MG capsule Take 1 capsule (75 mg total) by mouth 2 (two) times daily.   No facility-administered encounter medications on file as of 07/07/2016.     Allergies (verified) Review of patient's allergies indicates no known allergies.   History: Past Medical History:  Diagnosis Date  . Asthma, chronic 06/02/2013  . Carpal tunnel syndrome 04/17/2015   Bilateral  . Depression 06/02/2013  . DYSPNEA ON EXERTION 06/04/2009   Qualifier: Diagnosis of  By: Domenic Polite, MD, Phillips Hay   . GERD (gastroesophageal reflux disease)   . Hereditary and idiopathic peripheral neuropathy 04/17/2015  . Hyperlipemia 06/02/2013  . LEG PAIN, BILATERAL 06/04/2009   Qualifier: Diagnosis of  By: Domenic Polite, MD, Phillips Hay   . PERCUTANEOUS TRANSLUMINAL CORONARY ANGIOPLASTY, HX OF 06/04/2009   Qualifier: Diagnosis of  By: Domenic Polite, MD, Phillips Hay   .  Prostate CA [facility])    Past Surgical History:  Procedure Laterality Date  . CARDIAC CATHETERIZATION  1997  . HIATAL HERNIA REPAIR    . LAPAROSCOPIC GASTRIC BANDING  2000   [facility]   . ROBOT ASSISTED LAPAROSCOPIC RADICAL PROSTATECTOMY     Family History  Problem Relation Age of Onset  . Heart disease      Early Cardiac disease for uncle is their 84's as well as deceased.  Marland Kitchen Heart failure Mother   . Dementia Mother   . Cancer Father 70    testicular, lung  . Esophageal cancer Brother    Social History   Occupational  History  . Not on file.   Social History Main Topics  . Smoking status: Former Smoker    Packs/day: 1.00    Years: 20.00    Types: Cigarettes    Quit date: 01/19/1996  . Smokeless tobacco: Never Used  . Alcohol use 1.2 oz/week    2 Cans of beer per week  . Drug use: No  . Sexual activity: Yes   Tobacco Counseling No tobacco use  Activities of Daily Living In your present state of health, do you have any difficulty performing the following activities: 07/07/2016  Hearing? N  Vision? N  Difficulty concentrating or making decisions? Y  Walking or climbing stairs? N  Dressing or bathing? N  Doing errands, shopping? N  Preparing Food and eating ? N  Using the Toilet? N  In the past six months, have you accidently leaked urine? N  Do you have problems with loss of bowel control? N  Managing your Medications? N  Managing your Finances? N  Housekeeping or managing your Housekeeping? N  Some recent data might be hidden    Immunizations and Health Maintenance Immunization History  Administered Date(s) Administered  . Influenza, High Dose Seasonal PF 07/07/2016  . Influenza,inj,Quad PF,36+ Mos 06/27/2013, 06/25/2014, 06/28/2015  . Pneumococcal Conjugate-13 06/28/2015  . Pneumococcal Polysaccharide-23 10/27/2011  . Tdap 07/07/2016   Health Maintenance Due  Topic Date Due  . Hepatitis C Screening  Nov 04, 1945    Patient Care Team: Dorothy Spark, MD as PCP - General (Cardiology) Magnus Sinning, MD as Consulting Physician Magnus Sinning, MD as Consulting Physician Mcarthur Rossetti, MD as Consulting Physician (Orthopedic Surgery) Adelina Mings Margret Chance, MD as Referring Physician (Optometry) Jessy Oto, MD as Consulting Physician (Orthopedic Surgery) Geri Seminole, MD as Referring Physician (Dermatopathology)   Assessment:   This is a routine wellness examination for [name] [name].  Hearing/Vision screen No hearing or vision deficit noted. Eye exam up to date.    Dietary issues and exercise activities discussed: Current Exercise Habits: Home exercise routine, Type of exercise: walking, Time (Minutes): 30, Frequency (Times/Week): >7, Weekly Exercise (Minutes/Week): 0, Intensity: Moderate  Patient reports eating mainly home prepared meals twice daily.   Goals    . Exercise 3x per week (30 min per time)          Continue to walk 30 min daily      Depression Screen PHQ 2/9 Scores 07/07/2016 08/28/2015 06/28/2015 08/02/2014  PHQ - 2 Score 0 0 0 2  PHQ- 9 Score - - - 9  Patient has been taking Celexa for 6 years and feels fine. No depression noted. He would like to taper off of this. I will forward his request to his PCP for advice.    Fall Risk Fall Risk  07/07/2016 08/28/2015 06/28/2015 08/02/2014 01/11/2014  Falls in the past year? No  No No No No    Cognitive Function: MMSE - Mini Mental State Exam 07/07/2016  Orientation to time 5  Orientation to Place 5  Registration 3  Attention/ Calculation 5  Recall 3  Language- name 2 objects 2  Language- repeat 1  Language- follow 3 step command 3  Language- read & follow direction 1  Write a sentence 1  Copy design 1  Total score 30       Screening Tests Health Maintenance  Topic Date Due  . Hepatitis C Screening  24-Mar-1946  . ZOSTAVAX  08/07/2016 (Originally 09/01/2006)  . COLONOSCOPY  12/14/2019  . TETANUS/TDAP  07/07/2026  . INFLUENZA VACCINE  Completed  . PNA vac Low Risk Adult  Completed        Plan:    During the course of the visit [name] [name] was educated and counseled about the following appropriate screening and preventive services:   Vaccines: Pneumoccal-up to date, Influenza-given today,Tdap- given today, Zostavax-will get at next visit  Electrocardiogram-Done at cardio office 09/03/15  Colorectal cancer screening-up to date, done 12/13/09  Cardiovascular disease screening-F/u with cardio scheduled for 09/2016  Diabetes screening-with routine labs  Glaucoma  screening-checked annually  Nutrition counseling  Patient Instructions (the written plan) were given to the patient.   Chong Sicilian, RN  07/07/2016    I have reviewed and agree with the above AWV documentation.   Mary-Margaret Hassell Done, FNP

## 2016-07-07 NOTE — Telephone Encounter (Signed)
During AWV patient mentioned that he has been taking Celexa for 6 years and feels fine. He would like to taper off of it but wanted to clear it with his PCP, Chevis Pretty, FNP first.   If agreeable please advise patient on how to taper off of Celexa.

## 2016-07-13 ENCOUNTER — Other Ambulatory Visit: Payer: Self-pay | Admitting: Nurse Practitioner

## 2016-08-10 ENCOUNTER — Other Ambulatory Visit: Payer: Self-pay | Admitting: Nurse Practitioner

## 2016-08-10 DIAGNOSIS — I10 Essential (primary) hypertension: Secondary | ICD-10-CM

## 2016-08-10 NOTE — Telephone Encounter (Signed)
Last refill without being seen 

## 2016-08-18 DIAGNOSIS — Z8546 Personal history of malignant neoplasm of prostate: Secondary | ICD-10-CM | POA: Diagnosis not present

## 2016-08-26 DIAGNOSIS — K219 Gastro-esophageal reflux disease without esophagitis: Secondary | ICD-10-CM | POA: Diagnosis not present

## 2016-08-26 DIAGNOSIS — R110 Nausea: Secondary | ICD-10-CM | POA: Diagnosis not present

## 2016-08-26 DIAGNOSIS — K5730 Diverticulosis of large intestine without perforation or abscess without bleeding: Secondary | ICD-10-CM | POA: Diagnosis not present

## 2016-08-31 ENCOUNTER — Ambulatory Visit: Payer: Medicare Other | Admitting: Cardiology

## 2016-08-31 ENCOUNTER — Other Ambulatory Visit: Payer: Self-pay | Admitting: Nurse Practitioner

## 2016-08-31 ENCOUNTER — Other Ambulatory Visit: Payer: Medicare Other | Admitting: *Deleted

## 2016-08-31 DIAGNOSIS — I10 Essential (primary) hypertension: Secondary | ICD-10-CM | POA: Diagnosis not present

## 2016-08-31 DIAGNOSIS — E785 Hyperlipidemia, unspecified: Secondary | ICD-10-CM | POA: Diagnosis not present

## 2016-08-31 LAB — COMPREHENSIVE METABOLIC PANEL
ALT: 17 U/L (ref 9–46)
AST: 16 U/L (ref 10–35)
Albumin: 4 g/dL (ref 3.6–5.1)
Alkaline Phosphatase: 65 U/L (ref 40–115)
BUN: 13 mg/dL (ref 7–25)
CO2: 32 mmol/L — ABNORMAL HIGH (ref 20–31)
Calcium: 9.4 mg/dL (ref 8.6–10.3)
Chloride: 101 mmol/L (ref 98–110)
Creat: 1.24 mg/dL (ref 0.70–1.25)
Glucose, Bld: 125 mg/dL — ABNORMAL HIGH (ref 65–99)
Potassium: 4.4 mmol/L (ref 3.5–5.3)
Sodium: 139 mmol/L (ref 135–146)
Total Bilirubin: 0.5 mg/dL (ref 0.2–1.2)
Total Protein: 6.7 g/dL (ref 6.1–8.1)

## 2016-08-31 LAB — LIPID PANEL
Cholesterol: 225 mg/dL — ABNORMAL HIGH (ref <200)
HDL: 32 mg/dL — ABNORMAL LOW (ref >40)
LDL Cholesterol: 128 mg/dL — ABNORMAL HIGH (ref <100)
Total CHOL/HDL Ratio: 7 Ratio — ABNORMAL HIGH (ref <5.0)
Triglycerides: 324 mg/dL — ABNORMAL HIGH (ref <150)
VLDL: 65 mg/dL — ABNORMAL HIGH (ref <30)

## 2016-09-01 ENCOUNTER — Telehealth: Payer: Self-pay | Admitting: *Deleted

## 2016-09-01 ENCOUNTER — Other Ambulatory Visit: Payer: Self-pay

## 2016-09-01 ENCOUNTER — Ambulatory Visit: Payer: Medicare Other | Admitting: Cardiology

## 2016-09-01 MED ORDER — FENOFIBRATE 145 MG PO TABS: 145.0000 mg | ORAL_TABLET | Freq: Every day | ORAL | 11 refills | Status: DC

## 2016-09-01 MED ORDER — FENOFIBRATE MICRONIZED 130 MG PO CAPS: 130.0000 mg | ORAL_CAPSULE | Freq: Every day | ORAL | 1 refills | Status: DC

## 2016-09-01 NOTE — Telephone Encounter (Signed)
-----   Message from Dorothy Spark, MD sent at 09/01/2016  8:44 AM EST ----- Elevated LDL, but improved from 151 to 128, TG very elevated, I would reccomend fenofibrate 130 mg po once daily, he is oppsed to statins. If he doesn't want fenofibrate we can discuss at the next visit.

## 2016-09-01 NOTE — Telephone Encounter (Signed)
Tried notifying the pt and Spouse answered the phone.  Informed the pts wife (on [place]) that we have the pts lab results and recommendations per Dr [name].  Per the wife, she states that they saw his results on mychart last night, and noted his cholesterol was high.  Per the wife, it is ok to endorse what Dr [name] recommends for the pt, for he is at work and cannot be reached at this time.  Wife states she will endorse Dr Francesca Oman recommendations to the pt.  Informed the Wife that per Dr [name], he had elevated LDL, but improved from 151 to 128, TG very elevated.  Informed the wife that per Dr [name], she recommends fenofibrate 130 mg po once daily, being he is oppsed to statins.  Informed the wife that per Dr [name], If he doesn't want fenofibrate we can discuss at the next visit on 10/07/16.  Per the wife, she request that we start the pt on this med, and send only a month supply to their local pharmacy of choice, to make sure he tolerates this appropriately.  Wife verbalized understanding and agrees with this plan.  Wife will endorse this to the pt.

## 2016-09-03 DIAGNOSIS — E291 Testicular hypofunction: Secondary | ICD-10-CM | POA: Diagnosis not present

## 2016-09-03 DIAGNOSIS — N5201 Erectile dysfunction due to arterial insufficiency: Secondary | ICD-10-CM | POA: Diagnosis not present

## 2016-09-03 DIAGNOSIS — Z8546 Personal history of malignant neoplasm of prostate: Secondary | ICD-10-CM | POA: Diagnosis not present

## 2016-09-08 NOTE — Telephone Encounter (Signed)
Last refill without being seen 

## 2016-09-24 DIAGNOSIS — K3189 Other diseases of stomach and duodenum: Secondary | ICD-10-CM | POA: Diagnosis not present

## 2016-09-24 DIAGNOSIS — K2970 Gastritis, unspecified, without bleeding: Secondary | ICD-10-CM | POA: Diagnosis not present

## 2016-09-24 DIAGNOSIS — K219 Gastro-esophageal reflux disease without esophagitis: Secondary | ICD-10-CM | POA: Diagnosis not present

## 2016-09-24 DIAGNOSIS — R12 Heartburn: Secondary | ICD-10-CM | POA: Diagnosis not present

## 2016-09-24 DIAGNOSIS — K319 Disease of stomach and duodenum, unspecified: Secondary | ICD-10-CM | POA: Diagnosis not present

## 2016-10-06 NOTE — Progress Notes (Signed)
Patient ID: ARAGON GULDEN, male   DOB: 1946-04-24, 71 y.o.   MRN: [id number]    Patient Name: [name] [name] Date of Encounter: 10/06/2016  Primary Care Provider:  Ena Dawley, MD Primary Cardiologist:  Ena Dawley  Patient Profile  Atrial fibrillation  Problem List   Past Medical History:  Diagnosis Date  . Asthma, chronic 06/02/2013  . Carpal tunnel syndrome 04/17/2015   Bilateral  . Depression 06/02/2013  . DYSPNEA ON EXERTION 06/04/2009   Qualifier: Diagnosis of  By: Domenic Polite, MD, Phillips Hay   . GERD (gastroesophageal reflux disease)   . Hereditary and idiopathic peripheral neuropathy 04/17/2015  . Hyperlipemia 06/02/2013  . LEG PAIN, BILATERAL 06/04/2009   Qualifier: Diagnosis of  By: Domenic Polite, MD, Phillips Hay   . PERCUTANEOUS TRANSLUMINAL CORONARY ANGIOPLASTY, HX OF 06/04/2009   Qualifier: Diagnosis of  By: Domenic Polite, MD, Phillips Hay   . Prostate CA [facility])    Past Surgical History:  Procedure Laterality Date  . CARDIAC CATHETERIZATION  1997  . HIATAL HERNIA REPAIR    . LAPAROSCOPIC GASTRIC BANDING  2000   [facility]   . ROBOT ASSISTED LAPAROSCOPIC RADICAL PROSTATECTOMY      Allergies  No Known Allergies  HPI  71 year old male with a history of COPD, hyperlipidemia, prior history of prostate cancer now in remission who was referred to [place] for a finding of atrial fibrillation but his primary care physician. The patient was previously very active is about 10 years ago he was running to 3 miles daily, afterwards he started to have joint problems and had multiple surgeries that led to significant deconditioning. The patient is stating that for at least the last year he felt significant shortness of breath, he walks every day 20 minutes to 1 hour any incline makes him gasp for breath. He denies any palpitations. The patient is complaining of dizzy spells that has been increasing in frequency and he had one episode of near-syncope, after he was  walking his dog. The patient is not aware of palpitations at that time. The patient isn't denies symptoms of orthopnea, paroxysmal nocturnal dyspnea, lower extremity edema.  09/03/2015 - 1 year follow-up. The patient denies any palpitations or syncope. He stays active, however doesn't exercise and eats out and eats fried food a lot. No LE edema, orthopnea, PND. He denies any chest pain or shortness of breath. He is complaint with his meds.  10/07/2016 -in December 2017 he had elevated LDL, but improved from 151 to 128, TG very elevated at 324, started on fenofibrate 130 mg po once daily. Normal LFTs, Crea 1.24. He is tolerating fenofibrate very well but had such bad side effects with statins that he doesn't want to start them again. He has been on a low-carb diet and has lost 10 pounds in the last year his goal is to lose another 40 pounds. 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 No hospitalizations since the last visit.  Home Medications  Prior to Admission medications   Medication Sig Start Date End Date Taking? Authorizing Provider  budesonide-formoterol (SYMBICORT) 160-4.5 MCG/ACT inhaler Inhale 2 puffs into the lungs 2 (two) times daily. 06/02/13   Mary-Margaret Hassell Done, FNP  citalopram (CELEXA) 20 MG tablet Take 1 tablet (20 mg total) by mouth daily. 06/02/13   Mary-Margaret Hassell Done, FNP  fenofibrate 160 MG tablet Take 1 tablet (160 mg total)  by mouth daily. 06/02/13   Mary-Margaret Hassell Done, FNP  montelukast (SINGULAIR) 10 MG tablet Take 1 tablet (10 mg total) by mouth at bedtime. 06/02/13   Mary-Margaret Hassell Done, FNP  Naproxen Sodium (NAPRELAN) 750 MG TB24 Take 750 mg by mouth daily.    Historical Provider, MD  simvastatin (ZOCOR) 40 MG tablet Take 1 tablet (40 mg total) by mouth at bedtime. 06/02/13   Mary-Margaret Hassell Done, FNP    Family History  Family History  Problem Relation Age of Onset  . Heart  disease      Early Cardiac disease for uncle is their 31's as well as deceased.  Marland Kitchen Heart failure Mother   . Dementia Mother   . Cancer Father 88    testicular, lung  . Esophageal cancer Brother     Social History  Social History   Social History  . Marital status: Married    Spouse name: N/A  . Number of children: N/A  . Years of education: N/A   Occupational History  . Not on file.   Social History Main Topics  . Smoking status: Former Smoker    Packs/day: 1.00    Years: 20.00    Types: Cigarettes    Quit date: 01/19/1996  . Smokeless tobacco: Never Used  . Alcohol use 1.2 oz/week    2 Cans of beer per week  . Drug use: No  . Sexual activity: Yes   Other Topics Concern  . Not on file   Social History Narrative  . No narrative on file     Review of Systems, as per history of present illness otherwise negative General:  No chills, fever, night sweats or weight changes.  Cardiovascular:  No chest pain, dyspnea on exertion, edema, orthopnea, palpitations, paroxysmal nocturnal dyspnea. Dermatological: No rash, lesions/masses Respiratory: No cough, dyspnea Urologic: No hematuria, dysuria Abdominal:   No nausea, vomiting, diarrhea, bright red blood per rectum, melena, or hematemesis Neurologic:  No visual changes, wkns, changes in mental status. All other systems reviewed and are otherwise negative except as noted above.  Physical Exam  BP 142/70, HR 70 BPM General: Pleasant, NAD Psych: Normal affect. Neuro: Alert and oriented X 3. Moves all extremities spontaneously. HEENT: Normal  Neck: Supple without bruits or JVD. Lungs:  Resp regular and unlabored, CTA. Heart: RRR no s3, s4, or murmurs. Abdomen: Soft, non-tender, non-distended, BS + x 4.  Extremities: No clubbing, cyanosis or edema. DP/PT/Radials 2+ and equal bilaterally.  Accessory Clinical Findings  ECG - sinus rhythm 61 beats per minute, normal EKG   TTE: 07/2013 Left ventricle: The cavity size  was normal. Wall thickness was increased in a pattern of moderate LVH. Systolic function was normal. The estimated ejection fraction was in the range of 50% to 55%. Wall motion was normal; there were no regional wall motion abnormalities. Doppler parameters are consistent with abnormal left ventricular relaxation (grade 1 diastolic dysfunction). Left atrium: The atrium was mildly dilated.  EKG- sinus bradycardia, otherwise normal   Assessment & Plan  1. Paroxysmal atrial fibrillation diagnosed in 2014 - now in [place], E-cardio monitor showed 0% a-fib.  No palpitations meanwhile.  Echocardiogram shows mild LA enlargement. On aspirin 81 mg po daily for now. TSH normal.  2. Hypertension - controlled now, echo shows moderate LVH, BP now controlled with amlodipine to 5 mg po daily and atenolol 25 mg daily, avoid ACEI or ARB due to renal impairment.  3. Hyperlipidemia - intolerant to simva- and atorvastatin, with significant side effects adamant  about not taking other statin, in 12/17 - LDL 128, previously TG 324, started on fenofibrate that he tolerates well. We'll ask her pharmacist Dr. supple if she can qualify him for PC SK 9 inhibitors. We had a long talk about weight loss and proper exercise his very motivated.  Follow up in 1 year with CMP and lipids prior to the visit.  Ena Dawley, MD 10/06/2016, 9:15 AM

## 2016-10-07 ENCOUNTER — Other Ambulatory Visit: Payer: Self-pay | Admitting: Nurse Practitioner

## 2016-10-07 ENCOUNTER — Ambulatory Visit (INDEPENDENT_AMBULATORY_CARE_PROVIDER_SITE_OTHER): Payer: Medicare Other | Admitting: Cardiology

## 2016-10-07 ENCOUNTER — Encounter: Payer: Self-pay | Admitting: Cardiology

## 2016-10-07 VITALS — BP 138/72 | HR 64 | Ht 74.0 in | Wt 258.0 lb

## 2016-10-07 DIAGNOSIS — I10 Essential (primary) hypertension: Secondary | ICD-10-CM

## 2016-10-07 DIAGNOSIS — E782 Mixed hyperlipidemia: Secondary | ICD-10-CM

## 2016-10-07 DIAGNOSIS — Z789 Other specified health status: Secondary | ICD-10-CM

## 2016-10-07 DIAGNOSIS — I480 Paroxysmal atrial fibrillation: Secondary | ICD-10-CM

## 2016-10-07 NOTE — Patient Instructions (Signed)
Your physician recommends that you continue on your current medications as directed. Please refer to the Current Medication list given to you today. Your physician wants you to follow-up in: [place].   You will receive a reminder letter in the mail two months in advance. If you don't receive a letter, please call our office to schedule the follow-up appointment.

## 2016-10-08 ENCOUNTER — Telehealth: Payer: Self-pay | Admitting: Pharmacist

## 2016-10-08 DIAGNOSIS — E785 Hyperlipidemia, unspecified: Secondary | ICD-10-CM

## 2016-10-08 MED ORDER — ROSUVASTATIN CALCIUM 10 MG PO TABS: 10.0000 mg | ORAL_TABLET | Freq: Every day | ORAL | 3 refills | Status: DC

## 2016-10-08 NOTE — Telephone Encounter (Signed)
Thank you Megan.

## 2016-10-08 NOTE — Telephone Encounter (Signed)
Pt referred by Dr [name] for consideration of PCSK9i due to history of statin intolerance. Unfortunately, pt will not qualify for therapy since he does not have ASCVD or FH. Spoke with pt on the phone - he reports intolerances to simvastatin 40mg  daily and pravastatin. Ideally would prefer to keep LDL < 100 given family history of heart disease. Pt is willing to rechallenge with Crestor 10mg  daily. He will continue on fenofibrate as well. Advised pt that if he experiences myalgias with Crestor 10mg  daily, he can reduce the frequency of his dosing to a few days a week. Will f/u with lipid panel in 3 months.

## 2016-10-21 ENCOUNTER — Ambulatory Visit (INDEPENDENT_AMBULATORY_CARE_PROVIDER_SITE_OTHER): Payer: Medicare Other | Admitting: Orthopaedic Surgery

## 2016-11-03 ENCOUNTER — Other Ambulatory Visit: Payer: Self-pay | Admitting: Nurse Practitioner

## 2016-11-04 ENCOUNTER — Ambulatory Visit (INDEPENDENT_AMBULATORY_CARE_PROVIDER_SITE_OTHER): Payer: Medicare Other | Admitting: Orthopaedic Surgery

## 2016-11-04 DIAGNOSIS — G8929 Other chronic pain: Secondary | ICD-10-CM | POA: Diagnosis not present

## 2016-11-04 DIAGNOSIS — M25562 Pain in left knee: Secondary | ICD-10-CM | POA: Diagnosis not present

## 2016-11-04 MED ORDER — LIDOCAINE HCL 1 % IJ SOLN
3.0000 mL | INTRAMUSCULAR | Status: AC | PRN
  Administered 2016-11-04: 3 mL

## 2016-11-04 MED ORDER — METHYLPREDNISOLONE ACETATE 40 MG/ML IJ SUSP
40.0000 mg | INTRAMUSCULAR | Status: AC | PRN
  Administered 2016-11-04: 40 mg via INTRA_ARTICULAR

## 2016-11-04 NOTE — Progress Notes (Signed)
Office Visit Note   Patient: [name] [name]           Date of Birth: 01-07-1946           MRN: [id number] Visit Date: 11/04/2016              Requested by: No referring provider defined for this encounter. PCP: No primary care provider on file.   Assessment & Plan: Visit Diagnoses:  1. Chronic pain of left knee     Plan: He tolerated the steroid injection well and his left knee. I like see him back in 3 weeks and if he still having mechanical symptoms I would like to have an AP and lateral of his left knee.  Follow-Up Instructions: Return in about 3 weeks (around 11/25/2016).   Orders:  Orders Placed This Encounter  Procedures  . Large Joint Injection/Arthrocentesis   No orders of the defined types were placed in this encounter.     Procedures: Large Joint Inj Date/Time: 11/04/2016 1:33 PM Performed by: Mcarthur Rossetti Authorized by: Mcarthur Rossetti   Location:  Knee Site:  L knee Ultrasound Guidance: No   Fluoroscopic Guidance: No   Arthrogram: No   Medications:  3 mL lidocaine 1 %; 40 mg methylPREDNISolone acetate 40 MG/ML     Clinical Data: No additional findings.   Subjective: No chief complaint on file. The patient is well-known to me. He is a 71 year old with left knee pain he's had some locking catching recently as well as pain in the back of his knee. Before he did well with supplement injections in the knee. This is flared up on him more recent. He has pain mainly with activities.  HPI  Review of Systems He denies any recent illnesses and fever and chills.  Objective: Vital Signs: There were no vitals taken for this visit.  Physical Exam He is alert and oriented 3 in no acute distress Ortho Exam Examination of his left knee shows no effusion but there is deathly patellofemoral crepitation. There is medial lateral joint line tenderness but also tenderness around the hamstrings and gastroc muscles. There is no locking to range  of motion of the knee but definitely painful past 90 of flexion and this morning the back of his knee. Specialty Comments:  No specialty comments available.  Imaging: No results found.   PMFS History: Patient Active Problem List   Diagnosis Date Noted  . Essential hypertension 06/28/2015  . H/O prostate cancer 06/28/2015  . Hereditary and idiopathic peripheral neuropathy 04/17/2015  . Carpal tunnel syndrome 04/17/2015  . Hyperlipemia 06/02/2013  . Asthma, chronic 06/02/2013  . Depression 06/02/2013  . GERD 06/04/2009  . LEG PAIN, BILATERAL 06/04/2009  . DYSPNEA ON EXERTION 06/04/2009  . PERCUTANEOUS TRANSLUMINAL CORONARY ANGIOPLASTY, HX OF 06/04/2009   Past Medical History:  Diagnosis Date  . Asthma, chronic 06/02/2013  . Carpal tunnel syndrome 04/17/2015   Bilateral  . Depression 06/02/2013  . DYSPNEA ON EXERTION 06/04/2009   Qualifier: Diagnosis of  By: Domenic Polite, MD, Phillips Hay   . GERD (gastroesophageal reflux disease)   . Hereditary and idiopathic peripheral neuropathy 04/17/2015  . Hyperlipemia 06/02/2013  . LEG PAIN, BILATERAL 06/04/2009   Qualifier: Diagnosis of  By: Domenic Polite, MD, Phillips Hay   . PERCUTANEOUS TRANSLUMINAL CORONARY ANGIOPLASTY, HX OF 06/04/2009   Qualifier: Diagnosis of  By: Domenic Polite, MD, Phillips Hay   . Prostate CA [facility])     Family History  Problem Relation Age of  Onset  . Heart disease      Early Cardiac disease for uncle is their 35's as well as deceased.  Marland Kitchen Heart failure Mother   . Dementia Mother   . Cancer Father 43    testicular, lung  . Esophageal cancer Brother     Past Surgical History:  Procedure Laterality Date  . CARDIAC CATHETERIZATION  1997  . HIATAL HERNIA REPAIR    . LAPAROSCOPIC GASTRIC BANDING  2000   [facility]   . ROBOT ASSISTED LAPAROSCOPIC RADICAL PROSTATECTOMY     Social History   Occupational History  . Not on file.   Social History Main Topics  . Smoking status: Former Smoker     Packs/day: 1.00    Years: 20.00    Types: Cigarettes    Quit date: 01/19/1996  . Smokeless tobacco: Never Used  . Alcohol use 1.2 oz/week    2 Cans of beer per week  . Drug use: No  . Sexual activity: Yes

## 2016-11-10 ENCOUNTER — Other Ambulatory Visit: Payer: Self-pay | Admitting: Cardiology

## 2016-11-10 ENCOUNTER — Other Ambulatory Visit: Payer: Self-pay | Admitting: Nurse Practitioner

## 2016-11-10 DIAGNOSIS — I10 Essential (primary) hypertension: Secondary | ICD-10-CM

## 2016-11-25 ENCOUNTER — Encounter (INDEPENDENT_AMBULATORY_CARE_PROVIDER_SITE_OTHER): Payer: Self-pay | Admitting: Orthopaedic Surgery

## 2016-11-25 ENCOUNTER — Ambulatory Visit (INDEPENDENT_AMBULATORY_CARE_PROVIDER_SITE_OTHER): Payer: Medicare Other | Admitting: Orthopaedic Surgery

## 2016-11-25 VITALS — Wt 245.0 lb

## 2016-11-25 DIAGNOSIS — M25562 Pain in left knee: Principal | ICD-10-CM

## 2016-11-25 DIAGNOSIS — M1712 Unilateral primary osteoarthritis, left knee: Secondary | ICD-10-CM | POA: Diagnosis not present

## 2016-11-25 DIAGNOSIS — M1711 Unilateral primary osteoarthritis, right knee: Secondary | ICD-10-CM | POA: Insufficient documentation

## 2016-11-25 DIAGNOSIS — M25561 Pain in right knee: Secondary | ICD-10-CM

## 2016-11-25 DIAGNOSIS — G8929 Other chronic pain: Secondary | ICD-10-CM

## 2016-11-25 MED ORDER — HYALURONAN 88 MG/4ML IX SOSY
88.0000 mg | PREFILLED_SYRINGE | INTRA_ARTICULAR | Status: AC | PRN
  Administered 2016-11-25: 88 mg via INTRA_ARTICULAR

## 2016-11-25 NOTE — Progress Notes (Signed)
Office Visit Note   Patient: [name] [name]           Date of Birth: [birth year]           MRN: [id number] Visit Date: 11/25/2016              Requested by: No referring provider defined for this encounter. PCP: Chevis Pretty, FNP   Assessment & Plan: Visit Diagnoses:  1. Chronic pain of left knee   2. Unilateral primary osteoarthritis, left knee   3. Chronic pain of right knee   4. Unilateral primary osteoarthritis, right knee     Plan: He tolerated the hyaluronic acid injection well and his left and right knee.knee. We'll see him back in 2 months to see what effect this is had on him. We can always consider steroid injection at that visit.  Follow-Up Instructions: Return in about 2 months (around 01/25/2017).   Orders:  Orders Placed This Encounter  Procedures  . Large Joint Injection/Arthrocentesis  . Splint Application (AMB Ortho)  . Large Joint Injection/Arthrocentesis   No orders of the defined types were placed in this encounter.     Procedures: Large Joint Inj Date/Time: 11/25/2016 9:01 AM Performed by: Mcarthur Rossetti Authorized by: Jean Rosenthal Y   Location:  Knee Ultrasound Guidance: No   Fluoroscopic Guidance: No   Arthrogram: No   Medications:  88 mg Hyaluronan 88 MG/4ML Large Joint Inj Date/Time: 11/25/2016 9:03 AM Performed by: Mcarthur Rossetti Authorized by: Mcarthur Rossetti   Location:  Knee Site:  R knee Ultrasound Guidance: No   Fluoroscopic Guidance: No   Arthrogram: No   Medications:  88 mg Hyaluronan 88 MG/4ML     Clinical Data: No additional findings.   Subjective: Chief Complaint  Patient presents with  . Left Knee - Follow-up    3 wk follow up, s/p injection left knee 11/04/16  . Right Knee - Pain   He is well-known to me. He has bilateral knee pain that has improved somewhat steroid injections. He would like to consider hyaluronic acid injections today. He's lost weight is work on Sports coach exercises. He's had hyaluronic acid in the past is helped greatly. He denies any locking catching in his knees. It hurts mainly when he is been sitting for a while and goes to get up. HPI  Review of Systems He denies any current illnesses. He denies any fever, chills, nausea, vomiting.  Objective: Vital Signs: Wt 245 lb (111.1 kg)   BMI 31.46 kg/m   Physical Exam He is alert and oriented 3 and in no acute distress Ortho Exam Both knees have good range of motion with no significant effusions and mild pain. Specialty Comments:  No specialty comments available.  Imaging: No results found.   PMFS History: Patient Active Problem List   Diagnosis Date Noted  . Chronic pain of right knee 11/25/2016  . Unilateral primary osteoarthritis, left knee 11/25/2016  . Chronic pain of left knee 11/25/2016  . Unilateral primary osteoarthritis, right knee 11/25/2016  . Essential hypertension 06/28/2015  . H/O prostate cancer 06/28/2015  . Hereditary and idiopathic peripheral neuropathy 04/17/2015  . Carpal tunnel syndrome 04/17/2015  . Hyperlipemia 06/02/2013  . Asthma, chronic 06/02/2013  . Depression 06/02/2013  . GERD 06/04/2009  . LEG PAIN, BILATERAL 06/04/2009  . DYSPNEA ON EXERTION 06/04/2009  . PERCUTANEOUS TRANSLUMINAL CORONARY ANGIOPLASTY, HX OF 06/04/2009   Past Medical History:  Diagnosis Date  . Asthma, chronic 06/02/2013  .  Carpal tunnel syndrome 04/17/2015   Bilateral  . Depression 06/02/2013  . DYSPNEA ON EXERTION 06/04/2009   Qualifier: Diagnosis of  By: Domenic Polite, MD, Phillips Hay   . GERD (gastroesophageal reflux disease)   . Hereditary and idiopathic peripheral neuropathy 04/17/2015  . Hyperlipemia 06/02/2013  . LEG PAIN, BILATERAL 06/04/2009   Qualifier: Diagnosis of  By: Domenic Polite, MD, Phillips Hay   . PERCUTANEOUS TRANSLUMINAL CORONARY ANGIOPLASTY, HX OF 06/04/2009   Qualifier: Diagnosis of  By: Domenic Polite, MD, Phillips Hay   .  Prostate CA [facility])     Family History  Problem Relation Age of Onset  . Heart disease      Early Cardiac disease for uncle is their 44's as well as deceased.  Marland Kitchen Heart failure Mother   . Dementia Mother   . Cancer Father 50    testicular, lung  . Esophageal cancer Brother     Past Surgical History:  Procedure Laterality Date  . CARDIAC CATHETERIZATION  1997  . HIATAL HERNIA REPAIR    . LAPAROSCOPIC GASTRIC BANDING  2000   [facility]   . ROBOT ASSISTED LAPAROSCOPIC RADICAL PROSTATECTOMY     Social History   Occupational History  . Not on file.   Social History Main Topics  . Smoking status: Former Smoker    Packs/day: 1.00    Years: 20.00    Types: Cigarettes    Quit date: 01/19/1996  . Smokeless tobacco: Never Used  . Alcohol use 1.2 oz/week    2 Cans of beer per week  . Drug use: No  . Sexual activity: Yes

## 2016-12-09 ENCOUNTER — Other Ambulatory Visit: Payer: Self-pay | Admitting: Nurse Practitioner

## 2016-12-15 ENCOUNTER — Other Ambulatory Visit: Payer: Self-pay | Admitting: Nurse Practitioner

## 2016-12-16 DIAGNOSIS — E291 Testicular hypofunction: Secondary | ICD-10-CM | POA: Diagnosis not present

## 2016-12-30 DIAGNOSIS — E291 Testicular hypofunction: Secondary | ICD-10-CM | POA: Diagnosis not present

## 2016-12-30 DIAGNOSIS — N5201 Erectile dysfunction due to arterial insufficiency: Secondary | ICD-10-CM | POA: Diagnosis not present

## 2017-01-06 ENCOUNTER — Other Ambulatory Visit: Payer: Medicare Other | Admitting: *Deleted

## 2017-01-06 DIAGNOSIS — E785 Hyperlipidemia, unspecified: Secondary | ICD-10-CM

## 2017-01-06 LAB — LIPID PANEL
Chol/HDL Ratio: 2.6 ratio (ref 0.0–5.0)
Cholesterol, Total: 103 mg/dL (ref 100–199)
HDL: 39 mg/dL — ABNORMAL LOW (ref >39)
LDL Calculated: 40 mg/dL (ref 0–99)
Triglycerides: 121 mg/dL (ref 0–149)
VLDL Cholesterol Cal: 24 mg/dL (ref 5–40)

## 2017-01-26 ENCOUNTER — Ambulatory Visit (INDEPENDENT_AMBULATORY_CARE_PROVIDER_SITE_OTHER): Payer: Medicare Other | Admitting: Orthopaedic Surgery

## 2017-01-28 DIAGNOSIS — L821 Other seborrheic keratosis: Secondary | ICD-10-CM | POA: Diagnosis not present

## 2017-01-28 DIAGNOSIS — L579 Skin changes due to chronic exposure to nonionizing radiation, unspecified: Secondary | ICD-10-CM | POA: Diagnosis not present

## 2017-01-28 DIAGNOSIS — D1801 Hemangioma of skin and subcutaneous tissue: Secondary | ICD-10-CM | POA: Diagnosis not present

## 2017-01-28 DIAGNOSIS — L814 Other melanin hyperpigmentation: Secondary | ICD-10-CM | POA: Diagnosis not present

## 2017-01-28 DIAGNOSIS — Z85828 Personal history of other malignant neoplasm of skin: Secondary | ICD-10-CM | POA: Diagnosis not present

## 2017-01-28 DIAGNOSIS — L570 Actinic keratosis: Secondary | ICD-10-CM | POA: Diagnosis not present

## 2017-02-02 ENCOUNTER — Other Ambulatory Visit (INDEPENDENT_AMBULATORY_CARE_PROVIDER_SITE_OTHER): Payer: Self-pay | Admitting: Orthopaedic Surgery

## 2017-02-02 ENCOUNTER — Other Ambulatory Visit: Payer: Self-pay | Admitting: Nurse Practitioner

## 2017-02-03 ENCOUNTER — Ambulatory Visit (INDEPENDENT_AMBULATORY_CARE_PROVIDER_SITE_OTHER): Payer: Medicare Other | Admitting: Physician Assistant

## 2017-02-03 DIAGNOSIS — M1712 Unilateral primary osteoarthritis, left knee: Secondary | ICD-10-CM

## 2017-02-03 NOTE — Progress Notes (Signed)
  [name] returns today status post Monocryl this injections 11/25/2016. States overall both knees are doing pretty well. He does not have enough pain in either knee to warrant a cortisone injection at this time. His left knee is usually the worse knee.  Physical exam: Bilateral knees good range of motion crepitus with passive range of motion of both knees. Right knee no effusion abnormal warmth erythema. Left knee with very slight effusion no abnormal warmth or erythema. No instability valgus varus stressing of either knee.  Plan: He'll follow up with [place] on an as-needed basis. He understands he can have mild disc injections every 6 months as needed. Cortisone injections every 3 months as needed. Permanent handicapped placard paperwork was filled out for him today.

## 2017-02-10 ENCOUNTER — Other Ambulatory Visit: Payer: Self-pay | Admitting: Nurse Practitioner

## 2017-02-10 DIAGNOSIS — I10 Essential (primary) hypertension: Secondary | ICD-10-CM

## 2017-02-10 DIAGNOSIS — J4520 Mild intermittent asthma, uncomplicated: Secondary | ICD-10-CM

## 2017-03-09 ENCOUNTER — Other Ambulatory Visit: Payer: Self-pay | Admitting: Nurse Practitioner

## 2017-03-12 ENCOUNTER — Other Ambulatory Visit: Payer: Self-pay | Admitting: *Deleted

## 2017-03-12 MED ORDER — ATENOLOL 25 MG PO TABS: 25.0000 mg | ORAL_TABLET | Freq: Every day | ORAL | 1 refills | Status: DC

## 2017-03-12 NOTE — Telephone Encounter (Signed)
Patient called and requested a refill on atenolol. His pcp has been refilling this for him, but he states that he needs an appointment before they will refill this again. Looks like Dr [name] started him on this medication back in 2015. Just wanted to be sure okay to refill. Please advise. Thanks, MI

## 2017-03-23 ENCOUNTER — Ambulatory Visit (INDEPENDENT_AMBULATORY_CARE_PROVIDER_SITE_OTHER): Payer: Medicare Other | Admitting: Nurse Practitioner

## 2017-03-23 ENCOUNTER — Encounter: Payer: Self-pay | Admitting: Nurse Practitioner

## 2017-03-23 VITALS — BP 134/65 | HR 53 | Temp 97.7°F | Ht 74.0 in | Wt 223.8 lb

## 2017-03-23 DIAGNOSIS — M1711 Unilateral primary osteoarthritis, right knee: Secondary | ICD-10-CM | POA: Diagnosis not present

## 2017-03-23 DIAGNOSIS — K219 Gastro-esophageal reflux disease without esophagitis: Secondary | ICD-10-CM | POA: Diagnosis not present

## 2017-03-23 DIAGNOSIS — M1712 Unilateral primary osteoarthritis, left knee: Secondary | ICD-10-CM | POA: Diagnosis not present

## 2017-03-23 DIAGNOSIS — J4520 Mild intermittent asthma, uncomplicated: Secondary | ICD-10-CM | POA: Diagnosis not present

## 2017-03-23 DIAGNOSIS — F3342 Major depressive disorder, recurrent, in full remission: Secondary | ICD-10-CM

## 2017-03-23 DIAGNOSIS — I10 Essential (primary) hypertension: Secondary | ICD-10-CM | POA: Diagnosis not present

## 2017-03-23 DIAGNOSIS — E782 Mixed hyperlipidemia: Secondary | ICD-10-CM

## 2017-03-23 MED ORDER — CYANOCOBALAMIN 1000 MCG/ML IJ SOLN: INTRAMUSCULAR | 1 refills | Status: DC

## 2017-03-23 MED ORDER — AMLODIPINE BESYLATE 5 MG PO TABS: 5.0000 mg | ORAL_TABLET | Freq: Every day | ORAL | 1 refills | Status: DC

## 2017-03-23 MED ORDER — FENOFIBRATE 145 MG PO TABS: ORAL_TABLET | ORAL | 10 refills | Status: DC

## 2017-03-23 MED ORDER — MONTELUKAST SODIUM 10 MG PO TABS: 10.0000 mg | ORAL_TABLET | Freq: Every day | ORAL | 1 refills | Status: DC

## 2017-03-23 MED ORDER — NAPROXEN 500 MG PO TABS: ORAL_TABLET | ORAL | 5 refills | Status: DC

## 2017-03-23 MED ORDER — ATENOLOL 25 MG PO TABS: 25.0000 mg | ORAL_TABLET | Freq: Every day | ORAL | 1 refills | Status: DC

## 2017-03-23 MED ORDER — ROSUVASTATIN CALCIUM 10 MG PO TABS: 10.0000 mg | ORAL_TABLET | Freq: Every day | ORAL | 3 refills | Status: DC

## 2017-03-23 MED ORDER — CITALOPRAM HYDROBROMIDE 20 MG PO TABS: ORAL_TABLET | ORAL | 1 refills | Status: DC

## 2017-03-23 NOTE — Patient Instructions (Signed)
DASH Eating Plan DASH stands for "Dietary Approaches to Stop Hypertension." The DASH eating plan is a healthy eating plan that has been shown to reduce high blood pressure (hypertension). It may also reduce your risk for type 2 diabetes, heart disease, and stroke. The DASH eating plan may also help with weight loss. What are tips for following this plan? General guidelines  Avoid eating more than 2,300 mg (milligrams) of salt (sodium) a day. If you have hypertension, you may need to reduce your sodium intake to 1,500 mg a day.  Limit alcohol intake to no more than 1 drink a day for nonpregnant women and 2 drinks a day for men. One drink equals 12 oz of beer, 5 oz of wine, or 1 oz of hard liquor.  Work with your health care provider to maintain a healthy body weight or to lose weight. Ask what an ideal weight is for you.  Get at least 30 minutes of exercise that causes your heart to beat faster (aerobic exercise) most days of the week. Activities may include walking, swimming, or biking.  Work with your health care provider or diet and nutrition specialist (dietitian) to adjust your eating plan to your individual calorie needs. Reading food labels  Check food labels for the amount of sodium per serving. Choose foods with less than 5 percent of the Daily Value of sodium. Generally, foods with less than 300 mg of sodium per serving fit into this eating plan.  To find whole grains, look for the word "whole" as the first word in the ingredient list. Shopping  Buy products labeled as "low-sodium" or "no salt added."  Buy fresh foods. Avoid canned foods and premade or frozen meals. Cooking  Avoid adding salt when cooking. Use salt-free seasonings or herbs instead of table salt or sea salt. Check with your health care provider or pharmacist before using salt substitutes.  Do not fry foods. Cook foods using healthy methods such as baking, boiling, grilling, and broiling instead.  Cook with  heart-healthy oils, such as olive, canola, soybean, or sunflower oil. Meal planning   Eat a balanced diet that includes: ? 5 or more servings of fruits and vegetables each day. At each meal, try to fill half of your plate with fruits and vegetables. ? Up to 6-8 servings of whole grains each day. ? Less than 6 oz of lean meat, poultry, or fish each day. A 3-oz serving of meat is about the same size as a deck of cards. One egg equals 1 oz. ? 2 servings of low-fat dairy each day. ? A serving of nuts, seeds, or beans 5 times each week. ? Heart-healthy fats. Healthy fats called Omega-3 fatty acids are found in foods such as flaxseeds and coldwater fish, like sardines, salmon, and mackerel.  Limit how much you eat of the following: ? Canned or prepackaged foods. ? Food that is high in trans fat, such as fried foods. ? Food that is high in saturated fat, such as fatty meat. ? Sweets, desserts, sugary drinks, and other foods with added sugar. ? Full-fat dairy products.  Do not salt foods before eating.  Try to eat at least 2 vegetarian meals each week.  Eat more home-cooked food and less restaurant, buffet, and fast food.  When eating at a restaurant, ask that your food be prepared with less salt or no salt, if possible. What foods are recommended? The items listed may not be a complete list. Talk with your dietitian about what   dietary choices are best for you. Grains Whole-grain or whole-wheat bread. Whole-grain or whole-wheat pasta. Brown rice. Oatmeal. Quinoa. Bulgur. Whole-grain and low-sodium cereals. Pita bread. Low-fat, low-sodium crackers. Whole-wheat flour tortillas. Vegetables Fresh or frozen vegetables (raw, steamed, roasted, or grilled). Low-sodium or reduced-sodium tomato and vegetable juice. Low-sodium or reduced-sodium tomato sauce and tomato paste. Low-sodium or reduced-sodium canned vegetables. Fruits All fresh, dried, or frozen fruit. Canned fruit in natural juice (without  added sugar). Meat and other protein foods Skinless chicken or turkey. Ground chicken or turkey. Pork with fat trimmed off. Fish and seafood. Egg whites. Dried beans, peas, or lentils. Unsalted nuts, nut butters, and seeds. Unsalted canned beans. Lean cuts of beef with fat trimmed off. Low-sodium, lean deli meat. Dairy Low-fat (1%) or fat-free (skim) milk. Fat-free, low-fat, or reduced-fat cheeses. Nonfat, low-sodium ricotta or cottage cheese. Low-fat or nonfat yogurt. Low-fat, low-sodium cheese. Fats and oils Soft margarine without trans fats. Vegetable oil. Low-fat, reduced-fat, or light mayonnaise and salad dressings (reduced-sodium). Canola, safflower, olive, soybean, and sunflower oils. Avocado. Seasoning and other foods Herbs. Spices. Seasoning mixes without salt. Unsalted popcorn and pretzels. Fat-free sweets. What foods are not recommended? The items listed may not be a complete list. Talk with your dietitian about what dietary choices are best for you. Grains Baked goods made with fat, such as croissants, muffins, or some breads. Dry pasta or rice meal packs. Vegetables Creamed or fried vegetables. Vegetables in a cheese sauce. Regular canned vegetables (not low-sodium or reduced-sodium). Regular canned tomato sauce and paste (not low-sodium or reduced-sodium). Regular tomato and vegetable juice (not low-sodium or reduced-sodium). Pickles. Olives. Fruits Canned fruit in a light or heavy syrup. Fried fruit. Fruit in cream or butter sauce. Meat and other protein foods Fatty cuts of meat. Ribs. Fried meat. Bacon. Sausage. Bologna and other processed lunch meats. Salami. Fatback. Hotdogs. Bratwurst. Salted nuts and seeds. Canned beans with added salt. Canned or smoked fish. Whole eggs or egg yolks. Chicken or turkey with skin. Dairy Whole or 2% milk, cream, and half-and-half. Whole or full-fat cream cheese. Whole-fat or sweetened yogurt. Full-fat cheese. Nondairy creamers. Whipped toppings.  Processed cheese and cheese spreads. Fats and oils Butter. Stick margarine. Lard. Shortening. Ghee. Bacon fat. Tropical oils, such as coconut, palm kernel, or palm oil. Seasoning and other foods Salted popcorn and pretzels. Onion salt, garlic salt, seasoned salt, table salt, and sea salt. Worcestershire sauce. Tartar sauce. Barbecue sauce. Teriyaki sauce. Soy sauce, including reduced-sodium. Steak sauce. Canned and packaged gravies. Fish sauce. Oyster sauce. Cocktail sauce. Horseradish that you find on the shelf. Ketchup. Mustard. Meat flavorings and tenderizers. Bouillon cubes. Hot sauce and Tabasco sauce. Premade or packaged marinades. Premade or packaged taco seasonings. Relishes. Regular salad dressings. Where to find more information:  National Heart, Lung, and Blood Institute: www.nhlbi.nih.gov  American Heart Association: www.heart.org Summary  The DASH eating plan is a healthy eating plan that has been shown to reduce high blood pressure (hypertension). It may also reduce your risk for type 2 diabetes, heart disease, and stroke.  With the DASH eating plan, you should limit salt (sodium) intake to 2,300 mg a day. If you have hypertension, you may need to reduce your sodium intake to 1,500 mg a day.  When on the DASH eating plan, aim to eat more fresh fruits and vegetables, whole grains, lean proteins, low-fat dairy, and heart-healthy fats.  Work with your health care provider or diet and nutrition specialist (dietitian) to adjust your eating plan to your individual   calorie needs. This information is not intended to replace advice given to you by your health care provider. Make sure you discuss any questions you have with your health care provider. Document Released: 08/20/2011 Document Revised: 08/24/2016 Document Reviewed: 08/24/2016 Elsevier Interactive Patient Education  2017 [employer] Inc.  

## 2017-03-23 NOTE — Progress Notes (Signed)
Subjective:    Patient ID: [name] [name], male    DOB: March 01, 1946, 71 y.o.   MRN: [id number]  HPI  [name] [name] is here today for follow up of chronic medical problem.  Outpatient Encounter Prescriptions as of 03/23/2017  Medication Sig  . ADVAIR DISKUS 100-50 MCG/DOSE AEPB Inhale 1 puff into the lungs 2 (two) times daily.  Marland Kitchen amLODipine (NORVASC) 5 MG tablet Take 1 tablet (5 mg total) by mouth daily.  Marland Kitchen aspirin EC 81 MG tablet Take 81 mg by mouth daily.  Marland Kitchen atenolol (TENORMIN) 25 MG tablet Take 1 tablet (25 mg total) by mouth daily.  . budesonide-formoterol (SYMBICORT) 160-4.5 MCG/ACT inhaler Inhale 2 puffs into the lungs 2 (two) times daily.  . cetirizine (ZYRTEC) 10 MG tablet Take 10 mg by mouth daily.  . cholecalciferol (VITAMIN D) 1000 UNITS tablet Take 1,000 Units by mouth daily.  . citalopram (CELEXA) 20 MG tablet Take 1 tablet (20 mg total) by mouth daily.  . cyanocobalamin (,VITAMIN B-12,) 1000 MCG/ML injection INJECT 1ML EVERY MONTH  . fenofibrate (TRICOR) 145 MG tablet TAKE (1) TABLET DAILY BEFORE BREAKFAST.  Marland Kitchen loperamide (IMODIUM) 2 MG capsule Take 2 mg by mouth daily.  . montelukast (SINGULAIR) 10 MG tablet Take 1 tablet (10 mg total) by mouth daily.  . naproxen (NAPROSYN) 500 MG tablet TAKE  (1)  TABLET TWICE A DAY WITH FOOD.  . Naproxen Sodium (ALEVE) 220 MG CAPS Take 220 mg by mouth 2 (two) times daily.     1. Gastroesophageal reflux disease, esophagitis presence not specified  Currently on no meds- uses tums OTC occasionally  2. Essential hypertension  No c/o chest pain ,sob or ha- does  Not check blood sugars at home  3. Mixed hyperlipidemia  Does not watch diet  4. Recurrent major depressive disorder, in full remission ([place])  Currently on celexa- no side efects- working well for him  5. Unilateral primary osteoarthritis, right knee  Naprosyn working well  6. Unilateral primary osteoarthritis, left knee  Again naprosyn working well  7. Mild intermittent  chronic asthma without complication  Uses symbicort daily- deneis SOB    New complaints: None today      Review of Systems  Constitutional: Negative for activity change, appetite change and fatigue.  Respiratory: Negative for cough, shortness of breath and wheezing.   Cardiovascular: Negative for chest pain and palpitations.  Gastrointestinal: Negative for abdominal distention and abdominal pain.  Neurological: Negative for dizziness, light-headedness and headaches.  All other systems reviewed and are negative.      Objective:   Physical Exam  Constitutional: He is oriented to person, place, and time. He appears well-developed and well-nourished. No distress.  HENT:  Head: Normocephalic.  Right Ear: External ear normal.  Left Ear: External ear normal.  Nose: Nose normal.  Mouth/Throat: Oropharynx is clear and moist.  Eyes: Pupils are equal, round, and reactive to light.  Neck: Normal range of motion. Neck supple. No JVD present. No thyromegaly present.  Cardiovascular: Normal rate, regular rhythm, normal heart sounds and intact distal pulses.   No murmur heard. Pulmonary/Chest: Effort normal and breath sounds normal. No respiratory distress. He has no wheezes.  Abdominal: Soft. Bowel sounds are normal. He exhibits no distension. There is no tenderness.  Musculoskeletal: Normal range of motion. He exhibits no edema.  Lymphadenopathy:    He has no cervical adenopathy.  Neurological: He is alert and oriented to person, place, and time.  Skin: Skin is warm and  dry.  Psychiatric: He has a normal mood and affect. His behavior is normal. Judgment and thought content normal.   BP 134/65   Pulse (!) 53   Temp 97.7 F (36.5 C) (Oral)   Ht _0  (1.88 m)   Wt 223 lb 12.8 oz (101.5 kg)   BMI 28.73 kg/m      Assessment & Plan:  1. Essential hypertension Low salt diet.  Continue current medications. - amLODipine (NORVASC) 5 MG tablet; Take 1 tablet (5 mg total) by mouth  daily.  Dispense: 90 tablet; Refill: 1 - atenolol (TENORMIN) 25 MG tablet; Take 1 tablet (25 mg total) by mouth daily.  Dispense: 90 tablet; Refill: 1 - CMP14+EGFR  2. Gastroesophageal reflux disease, esophagitis presence not specified Avoid spicy foods Do not eat 2 hours prior to bedtime   3. Mixed hyperlipidemia Low fat diet. - fenofibrate (TRICOR) 145 MG tablet; TAKE (1) TABLET DAILY BEFORE BREAKFAST.  Dispense: 30 tablet; Refill: 10 - rosuvastatin (CRESTOR) 10 MG tablet; Take 1 tablet (10 mg total) by mouth daily.  Dispense: 90 tablet; Refill: 3 - Lipid panel  4. Recurrent major depressive disorder, in full remission ([place]) Continue medication.  Practice stress management techniques. - citalopram (CELEXA) 20 MG tablet; Take 1 tablet (20 mg total) by mouth daily.  Dispense: 90 tablet; Refill: 1  5. Unilateral primary osteoarthritis, right knee Continue exercise. - naproxen (NAPROSYN) 500 MG tablet; TAKE  (1)  TABLET TWICE A DAY WITH FOOD.  Dispense: 60 tablet; Refill: 5  6. Unilateral primary osteoarthritis, left knee Continue exercise. - naproxen (NAPROSYN) 500 MG tablet; TAKE  (1)  TABLET TWICE A DAY WITH FOOD.  Dispense: 60 tablet; Refill: 5  7. Mild intermittent chronic asthma without complication Continue medications and call with changes in breathing pattern or increased use of rescue inhaler. - montelukast (SINGULAIR) 10 MG tablet; Take 1 tablet (10 mg total) by mouth daily.  Dispense: 90 tablet; Refill: 1    Labs pending Health maintenance reviewed Diet and exercise encouraged Continue all meds Follow up  In 3 months.   Mary-Margaret Hassell Done, FNP Marissa Hite, SNP

## 2017-06-15 ENCOUNTER — Ambulatory Visit (INDEPENDENT_AMBULATORY_CARE_PROVIDER_SITE_OTHER): Payer: Medicare Other

## 2017-06-15 DIAGNOSIS — Z23 Encounter for immunization: Secondary | ICD-10-CM | POA: Diagnosis not present

## 2017-08-12 ENCOUNTER — Encounter: Payer: Self-pay | Admitting: Family Medicine

## 2017-08-12 ENCOUNTER — Ambulatory Visit (INDEPENDENT_AMBULATORY_CARE_PROVIDER_SITE_OTHER): Payer: Medicare Other | Admitting: Family Medicine

## 2017-08-12 VITALS — BP 124/61 | HR 55 | Temp 99.0°F | Ht 74.0 in | Wt 219.0 lb

## 2017-08-12 DIAGNOSIS — J0100 Acute maxillary sinusitis, unspecified: Secondary | ICD-10-CM | POA: Diagnosis not present

## 2017-08-12 MED ORDER — AMOXICILLIN-POT CLAVULANATE 875-125 MG PO TABS: 1.0000 | ORAL_TABLET | Freq: Two times a day (BID) | ORAL | 0 refills | Status: DC

## 2017-08-12 NOTE — Progress Notes (Signed)
Chief Complaint  Patient presents with  . Sinus Problem    pt here today c/o cough, sinus pressure, chest congestion and drainage.    HPI  Patient presents today for Patient presents with upper respiratory congestion. Rhinorrhea that is frequently purulent. There is moderate sore throat. Patient reports coughing frequently as well.  Moderate sputum noted. There is no fever, chills or sweats. The patient denies being short of breath. Onset was 3-5 days ago. Gradually worsening. Tried OTCs without improvement.  PMH: Smoking status noted ROS: Per HPI  Objective: BP 124/61   Pulse (!) 55   Temp 99 F (37.2 C) (Oral)   Ht 6\' 2"  (1.88 m)   Wt 219 lb (99.3 kg)   BMI 28.12 kg/m  Gen: NAD, alert, cooperative with exam HEENT: NCAT, Nasal passages swollen, red TMS RED CV: RRR, good S1/S2, no murmur Resp: Bronchitis changes with scattered wheezes, non-labored Ext: No edema, warm Neuro: Alert and oriented, No gross deficits  Assessment and plan:  1. Acute maxillary sinusitis, recurrence not specified     Meds ordered this encounter  Medications  . amoxicillin-clavulanate (AUGMENTIN) 875-125 MG tablet    Sig: Take 1 tablet by mouth 2 (two) times daily.    Dispense:  20 tablet    Refill:  0    No orders of the defined types were placed in this encounter.   Follow up as needed.  Claretta Fraise, MD

## 2017-08-12 NOTE — Patient Instructions (Signed)
Rest at home 3-4 days. Stay in and stay warm.

## 2017-08-26 DIAGNOSIS — L814 Other melanin hyperpigmentation: Secondary | ICD-10-CM | POA: Diagnosis not present

## 2017-08-26 DIAGNOSIS — D1801 Hemangioma of skin and subcutaneous tissue: Secondary | ICD-10-CM | POA: Diagnosis not present

## 2017-08-26 DIAGNOSIS — L821 Other seborrheic keratosis: Secondary | ICD-10-CM | POA: Diagnosis not present

## 2017-08-26 DIAGNOSIS — L579 Skin changes due to chronic exposure to nonionizing radiation, unspecified: Secondary | ICD-10-CM | POA: Diagnosis not present

## 2017-08-26 DIAGNOSIS — D225 Melanocytic nevi of trunk: Secondary | ICD-10-CM | POA: Diagnosis not present

## 2017-08-26 DIAGNOSIS — Z85828 Personal history of other malignant neoplasm of skin: Secondary | ICD-10-CM | POA: Diagnosis not present

## 2017-08-30 DIAGNOSIS — Z8546 Personal history of malignant neoplasm of prostate: Secondary | ICD-10-CM | POA: Diagnosis not present

## 2017-09-09 DIAGNOSIS — Z8546 Personal history of malignant neoplasm of prostate: Secondary | ICD-10-CM | POA: Diagnosis not present

## 2017-09-09 DIAGNOSIS — E291 Testicular hypofunction: Secondary | ICD-10-CM | POA: Diagnosis not present

## 2017-09-09 DIAGNOSIS — N5201 Erectile dysfunction due to arterial insufficiency: Secondary | ICD-10-CM | POA: Diagnosis not present

## 2017-09-14 HISTORY — PX: ROTATOR CUFF REPAIR: SHX139

## 2017-09-29 ENCOUNTER — Ambulatory Visit (INDEPENDENT_AMBULATORY_CARE_PROVIDER_SITE_OTHER): Payer: Medicare Other | Admitting: Orthopaedic Surgery

## 2017-09-29 ENCOUNTER — Encounter (INDEPENDENT_AMBULATORY_CARE_PROVIDER_SITE_OTHER): Payer: Self-pay | Admitting: Orthopaedic Surgery

## 2017-09-29 DIAGNOSIS — M1711 Unilateral primary osteoarthritis, right knee: Secondary | ICD-10-CM

## 2017-09-29 DIAGNOSIS — M1712 Unilateral primary osteoarthritis, left knee: Secondary | ICD-10-CM | POA: Diagnosis not present

## 2017-09-29 DIAGNOSIS — M170 Bilateral primary osteoarthritis of knee: Secondary | ICD-10-CM

## 2017-09-29 MED ORDER — LIDOCAINE HCL 1 % IJ SOLN
3.0000 mL | INTRAMUSCULAR | Status: AC | PRN
  Administered 2017-09-29: 3 mL

## 2017-09-29 MED ORDER — METHYLPREDNISOLONE ACETATE 40 MG/ML IJ SUSP
40.0000 mg | INTRAMUSCULAR | Status: AC | PRN
  Administered 2017-09-29: 40 mg via INTRA_ARTICULAR

## 2017-09-29 NOTE — Progress Notes (Signed)
   Procedure Note  Patient: [name] [name]             Date of Birth: [birth year]           MRN: [id number]             Visit Date: 09/29/2017  HPI Mr. Rog 72 year old male well-known to Dr. Ninfa Linden service comes in today due to bilateral knee pain.  He has known osteoarthritis of both knees.  He has had no new injury to either knee.  He states after walking some 3 miles that his left knee pain really increased over the last month or so.  Having pain mostly at the lateral aspect of the left knee but having some pain in the right knee also.  He is requesting injections in both knees.  Last underwent a Monovisc injection 11/25/2016 and did well until recently.  Physical exam bilateral knees no effusion abnormal warmth.  Good range of motion both knees.  Maximal tenderness lateral aspect of the left knee.  No instability of either knee  Procedures: Visit Diagnoses: Primary osteoarthritis of both knees  Large Joint Inj: bilateral knee on 09/29/2017 12:11 PM Indications: pain Details: 22 G 1.5 in needle, anterolateral approach  Arthrogram: No  Medications (Right): 3 mL lidocaine 1 %; 40 mg methylPREDNISolone acetate 40 MG/ML Medications (Left): 3 mL lidocaine 1 %; 40 mg methylPREDNISolone acetate 40 MG/ML Outcome: tolerated well, no immediate complications Procedure, treatment alternatives, risks and benefits explained, specific risks discussed. Consent was given by the patient. Immediately prior to procedure a time out was called to verify the correct patient, procedure, equipment, support staff and site/side marked as required. Patient was prepped and draped in the usual sterile fashion.     Plan: He will work on range of motion strengthening of both knees quad strengthening.  We will see him back for a Monovisc injections both knees once approved.  He did ask about the shoulder which she is having some discomfort in.  We may inject this at the return.  I did talk to him about some shoulder  exercises.  Brief examination of the right shoulder showed good strength of the rotator cuff and overall good range of motion of the shoulder.

## 2017-10-13 ENCOUNTER — Other Ambulatory Visit: Payer: Self-pay | Admitting: Nurse Practitioner

## 2017-10-13 DIAGNOSIS — J4520 Mild intermittent asthma, uncomplicated: Secondary | ICD-10-CM

## 2017-10-18 ENCOUNTER — Ambulatory Visit (INDEPENDENT_AMBULATORY_CARE_PROVIDER_SITE_OTHER): Payer: Medicare Other | Admitting: *Deleted

## 2017-10-18 ENCOUNTER — Encounter: Payer: Self-pay | Admitting: *Deleted

## 2017-10-18 VITALS — BP 119/65 | HR 54 | Ht 74.0 in | Wt 228.0 lb

## 2017-10-18 DIAGNOSIS — Z0000 Encounter for general adult medical examination without abnormal findings: Secondary | ICD-10-CM | POA: Diagnosis not present

## 2017-10-18 NOTE — Progress Notes (Signed)
Subjective:   [name] [name] is a 72 y.o. male who presents for a subsequent Medicare Annual Wellness Visit. He is retired from [employer] and is married. He has 3 adult children and 5 grandchildren.  Review of Systems  Reports that his health is about the same as last year.   Cardiac Risk Factors include: advanced age (>83men, >58 women);hypertension;male gender  Other systems negative today.   Objective:    Today's Vitals   10/18/17 1012  BP: 119/65  Pulse: (!) 54  Weight: 228 lb (103.4 kg)  Height: 6\' 2"  (1.88 m)   Body mass index is 29.27 kg/m.  Advanced Directives 10/08/2014  Does Patient Have a Medical Advance Directive? No  Would patient like information on creating a medical advance directive? No - patient declined information    Current Medications (verified) Outpatient Encounter Medications as of 10/18/2017  Medication Sig  . amLODipine (NORVASC) 5 MG tablet Take 1 tablet (5 mg total) by mouth daily.  Marland Kitchen amoxicillin-clavulanate (AUGMENTIN) 875-125 MG tablet Take 1 tablet by mouth 2 (two) times daily.  Marland Kitchen aspirin EC 81 MG tablet Take 81 mg by mouth daily.  Marland Kitchen atenolol (TENORMIN) 25 MG tablet Take 1 tablet (25 mg total) by mouth daily.  . budesonide-formoterol (SYMBICORT) 160-4.5 MCG/ACT inhaler Inhale 2 puffs into the lungs 2 (two) times daily.  . cetirizine (ZYRTEC) 10 MG tablet Take 10 mg by mouth daily.  . cholecalciferol (VITAMIN D) 1000 UNITS tablet Take 1,000 Units by mouth daily.  . citalopram (CELEXA) 20 MG tablet Take 1 tablet (20 mg total) by mouth daily.  . cyanocobalamin (,VITAMIN B-12,) 1000 MCG/ML injection INJECT 1ML EVERY MONTH  . fenofibrate (TRICOR) 145 MG tablet TAKE (1) TABLET DAILY BEFORE BREAKFAST.  Marland Kitchen loperamide (IMODIUM) 2 MG capsule Take 2 mg by mouth daily.  . montelukast (SINGULAIR) 10 MG tablet TAKE 1 TABLET DAILY  . naproxen (NAPROSYN) 500 MG tablet TAKE  (1)  TABLET TWICE A DAY WITH FOOD.  Marland Kitchen rosuvastatin (CRESTOR) 10 MG tablet Take 1 tablet  (10 mg total) by mouth daily.   No facility-administered encounter medications on file as of 10/18/2017.     Allergies (verified) Patient has no known allergies.   History: Past Medical History:  Diagnosis Date  . Asthma, chronic 06/02/2013  . Carpal tunnel syndrome 04/17/2015   Bilateral  . Depression 06/02/2013  . DYSPNEA ON EXERTION 06/04/2009   Qualifier: Diagnosis of  By: Domenic Polite, MD, Phillips Hay   . GERD (gastroesophageal reflux disease)   . Hereditary and idiopathic peripheral neuropathy 04/17/2015  . Hyperlipemia 06/02/2013  . LEG PAIN, BILATERAL 06/04/2009   Qualifier: Diagnosis of  By: Domenic Polite, MD, Phillips Hay   . PERCUTANEOUS TRANSLUMINAL CORONARY ANGIOPLASTY, HX OF 06/04/2009   Qualifier: Diagnosis of  By: Domenic Polite, MD, Phillips Hay   . Prostate CA [facility])    Past Surgical History:  Procedure Laterality Date  . CARDIAC CATHETERIZATION  1997  . HIATAL HERNIA REPAIR    . LAPAROSCOPIC GASTRIC BANDING  2000   [facility]   . ROBOT ASSISTED LAPAROSCOPIC RADICAL PROSTATECTOMY     Family History  Problem Relation Age of Onset  . Heart disease Unknown        Early Cardiac disease for uncle is their 61's as well as deceased.  Marland Kitchen Heart failure Mother   . Dementia Mother 42  . Cancer Father 45       testicular, lung  . Esophageal cancer Brother  Social History   Socioeconomic History  . Marital status: Married    Spouse name: [name]  . Number of children: 3  . Years of education: Bachelors degree  . Highest education level: Bachelor's degree (e.g., BA, AB, BS)  Social Needs  . Financial resource strain: Not hard at all  . Food insecurity - worry: Never true  . Food insecurity - inability: Never true  . Transportation needs - medical: No  . Transportation needs - non-medical: No  Occupational History  . Occupation: Retired    Comment: Nature conservation officer  Tobacco Use  . Smoking status: Former Smoker    Packs/day: 1.00    Years: 20.00    Pack years:  20.00    Types: Cigarettes    Last attempt to quit: 01/19/1996    Years since quitting: 21.7  . Smokeless tobacco: Never Used  Substance and Sexual Activity  . Alcohol use: Yes    Alcohol/week: 1.2 oz    Types: 2 Cans of beer per week  . Drug use: No  . Sexual activity: Yes  Other Topics Concern  . Not on file  Social History Narrative   Patient is married and lives at home with is wife. He has three adult children and five grandchildren. He is retired from [employer] and has a bachelor's degree.    Tobacco Counseling No tobacco use  Clinical Intake:     Pain : No/denies pain     Diabetes: No  How often do you need to have someone help you when you read instructions, pamphlets, or other written materials from your doctor or pharmacy?: 1 - Never What is the last grade level you completed in school?: Bachelors degree  Interpreter Needed?: No  Information entered by :: Chong Sicilian, RN  Activities of Daily Living In your present state of health, do you have any difficulty performing the following activities: 10/18/2017  Hearing? N  Vision? N  Comment eye exam is current. Sees Dr Anthony Sar  Difficulty concentrating or making decisions? N  Walking or climbing stairs? N  Dressing or bathing? N  Doing errands, shopping? N  Preparing Food and eating ? N  Using the Toilet? N  In the past six months, have you accidently leaked urine? N  Do you have problems with loss of bowel control? N  Managing your Medications? N  Managing your Finances? N  Housekeeping or managing your Housekeeping? N  Some recent data might be hidden     Immunizations and Health Maintenance Immunization History  Administered Date(s) Administered  . Influenza, High Dose Seasonal PF 07/07/2016, 06/15/2017  . Influenza,inj,Quad PF,6+ Mos 06/27/2013, 06/25/2014, 06/28/2015  . Pneumococcal Conjugate-13 06/28/2015  . Pneumococcal Polysaccharide-23 10/27/2011  . Tdap 07/07/2016   Health Maintenance Due    Topic Date Due  . Hepatitis C Screening  11/08/1945    Patient Care Team: Chevis Pretty, FNP as PCP - General (Family Medicine) Magnus Sinning, MD as Consulting Physician Magnus Sinning, MD as Consulting Physician Mcarthur Rossetti, MD as Consulting Physician (Orthopedic Surgery) Harlen Labs, MD as Referring Physician (Optometry) Jessy Oto, MD as Consulting Physician (Orthopedic Surgery) Geri Seminole, MD as Referring Physician (Dermatopathology)  No hospitalizations, ER visits, or surgeries this past year.      Assessment:   This is a routine wellness examination for Marice.  Hearing/Vision screen No deficits noted during visit. Eye exam is up to date.   Dietary issues and exercise activities discussed: Current Exercise Habits: Home exercise  routine(Patient is very active around his home and yard. He works daily), Type of exercise: Other - see comments, Time (Minutes): 60, Frequency (Times/Week): 7, Weekly Exercise (Minutes/Week): 420, Intensity: Moderate, Exercise limited by: orthopedic condition(s)  Goals    . Exercise 3x per week (30 min per time)     Continue to walk 30 min daily      Depression Screen PHQ 2/9 Scores 10/18/2017 08/12/2017 03/23/2017 07/07/2016  PHQ - 2 Score 0 0 0 0  PHQ- 9 Score - - - -    Fall Risk Fall Risk  10/18/2017 08/12/2017 03/23/2017 07/07/2016 08/28/2015  Falls in the past year? No No No No No    Is the patient's home free of loose throw rugs in walkways, pet beds, electrical cords, etc?   yes      Grab bars in the bathroom? no      Handrails on the stairs?   yes      Adequate lighting?   yes  Cognitive Function: MMSE - Mini Mental State Exam 10/18/2017 07/07/2016  Orientation to time 5 5  Orientation to Place 5 5  Registration 3 3  Attention/ Calculation 5 5  Recall 3 3  Language- name 2 objects 2 2  Language- repeat 1 1  Language- follow 3 step command 3 3  Language- read & follow direction 1 1  Write  a sentence 1 1  Copy design 0 1  Total score 29 30    Normal exam    Screening Tests Health Maintenance  Topic Date Due  . Hepatitis C Screening  [birth year]  . COLONOSCOPY  12/14/2019  . TETANUS/TDAP  07/07/2026  . INFLUENZA VACCINE  Completed  . PNA vac Low Risk Adult  Completed      Plan:  Keep f/u with PCP Hep C screening with next labs Increase activity level. Aim for at least 150 min of moderate activity weekly  Continue to work on diet and weight loss  I have personally reviewed and noted the following in the patient's chart:   . Medical and social history . Use of alcohol, tobacco or illicit drugs  . Current medications and supplements . Functional ability and status . Nutritional status . Physical activity . Advanced directives . List of other physicians . Hospitalizations, surgeries, and ER visits in previous 12 months . Vitals . Screenings to include cognitive, depression, and falls . Referrals and appointments  In addition, I have reviewed and discussed with patient certain preventive protocols, quality metrics, and best practice recommendations. A written personalized care plan for preventive services as well as general preventive health recommendations were provided to patient.     Chong Sicilian, RN  10/18/2017    I have reviewed and agree with the above AWV documentation.   Mary-Margaret Hassell Done, FNP

## 2017-10-18 NOTE — Patient Instructions (Signed)
  [name] [name],  Thank you for taking time to come for your Medicare Wellness Visit. I appreciate your ongoing commitment to your health goals. Please review the following plan we discussed and let me know if I can assist you in the future.   These are the goals we discussed: Goals    . Exercise 3x per week (30 min per time)     Continue to walk 30 min daily       This is a list of the screening recommended for you and due dates:  Health Maintenance  Topic Date Due  .  Hepatitis C: One time screening is recommended by Center for Disease Control  (CDC) for  adults born from [birth year] through 1965.   10/30/1945  . Colon Cancer Screening  12/14/2019  . Tetanus Vaccine  07/07/2026  . Flu Shot  Completed  . Pneumonia vaccines  Completed

## 2017-11-01 ENCOUNTER — Other Ambulatory Visit: Payer: Self-pay | Admitting: Nurse Practitioner

## 2017-11-01 DIAGNOSIS — F3342 Major depressive disorder, recurrent, in full remission: Secondary | ICD-10-CM

## 2017-11-08 ENCOUNTER — Other Ambulatory Visit: Payer: Self-pay | Admitting: Nurse Practitioner

## 2017-11-08 DIAGNOSIS — I10 Essential (primary) hypertension: Secondary | ICD-10-CM

## 2017-11-09 ENCOUNTER — Telehealth (INDEPENDENT_AMBULATORY_CARE_PROVIDER_SITE_OTHER): Payer: Self-pay

## 2017-11-09 NOTE — Telephone Encounter (Signed)
Submitted application online for bilateral knee, Monovisc Injection.

## 2017-11-10 ENCOUNTER — Encounter: Payer: Self-pay | Admitting: Cardiology

## 2017-11-10 ENCOUNTER — Telehealth (INDEPENDENT_AMBULATORY_CARE_PROVIDER_SITE_OTHER): Payer: Self-pay

## 2017-11-10 NOTE — Telephone Encounter (Signed)
Called and left VM for patient to return call to schedule appt. For Monovisc injection.

## 2017-11-11 ENCOUNTER — Ambulatory Visit (INDEPENDENT_AMBULATORY_CARE_PROVIDER_SITE_OTHER): Payer: Medicare Other | Admitting: Cardiology

## 2017-11-11 ENCOUNTER — Encounter: Payer: Self-pay | Admitting: Cardiology

## 2017-11-11 VITALS — BP 130/68 | HR 57 | Ht 74.0 in | Wt 228.0 lb

## 2017-11-11 DIAGNOSIS — I480 Paroxysmal atrial fibrillation: Secondary | ICD-10-CM | POA: Diagnosis not present

## 2017-11-11 DIAGNOSIS — I10 Essential (primary) hypertension: Secondary | ICD-10-CM

## 2017-11-11 DIAGNOSIS — E782 Mixed hyperlipidemia: Secondary | ICD-10-CM

## 2017-11-11 DIAGNOSIS — E785 Hyperlipidemia, unspecified: Secondary | ICD-10-CM

## 2017-11-11 MED ORDER — ATENOLOL 25 MG PO TABS: 25.0000 mg | ORAL_TABLET | Freq: Every day | ORAL | 2 refills | Status: DC

## 2017-11-11 MED ORDER — AMLODIPINE BESYLATE 5 MG PO TABS: 5.0000 mg | ORAL_TABLET | Freq: Every day | ORAL | 2 refills | Status: DC

## 2017-11-11 MED ORDER — FENOFIBRATE 145 MG PO TABS: ORAL_TABLET | ORAL | 2 refills | Status: DC

## 2017-11-11 MED ORDER — ROSUVASTATIN CALCIUM 10 MG PO TABS: 10.0000 mg | ORAL_TABLET | Freq: Every day | ORAL | 3 refills | Status: DC

## 2017-11-11 NOTE — Progress Notes (Signed)
Patient ID: [name] [name], male   DOB: 1946/04/06, 72 y.o.   MRN: [id number]    Patient Name: [name] [name] Date of Encounter: 11/13/2017  Primary Care Provider:  Chevis Pretty, FNP Primary Cardiologist:  Ena Dawley  Reason for visit: 1 year follow up  Problem List   Past Medical History:  Diagnosis Date  . Asthma, chronic 06/02/2013  . Carpal tunnel syndrome 04/17/2015   Bilateral  . Depression 06/02/2013  . DYSPNEA ON EXERTION 06/04/2009   Qualifier: Diagnosis of  By: Domenic Polite, MD, Phillips Hay   . GERD (gastroesophageal reflux disease)   . Hereditary and idiopathic peripheral neuropathy 04/17/2015  . Hyperlipemia 06/02/2013  . LEG PAIN, BILATERAL 06/04/2009   Qualifier: Diagnosis of  By: Domenic Polite, MD, Phillips Hay   . PERCUTANEOUS TRANSLUMINAL CORONARY ANGIOPLASTY, HX OF 06/04/2009   Qualifier: Diagnosis of  By: Domenic Polite, MD, Phillips Hay   . Prostate CA [facility])    Past Surgical History:  Procedure Laterality Date  . CARDIAC CATHETERIZATION  1997  . HIATAL HERNIA REPAIR    . LAPAROSCOPIC GASTRIC BANDING  2000   [facility]   . ROBOT ASSISTED LAPAROSCOPIC RADICAL PROSTATECTOMY      Allergies  No Known Allergies  HPI  72 year old male with a history of COPD, hyperlipidemia, prior history of prostate cancer now in remission who was referred to [place] for a finding of atrial fibrillation but his primary care physician. The patient was previously very active is about 10 years ago he was running to 3 miles daily, afterwards he started to have joint problems and had multiple surgeries that led to significant deconditioning. The patient is stating that for at least the last year he felt significant shortness of breath, he walks every day 20 minutes to 1 hour any incline makes him gasp for breath. He denies any palpitations. The patient is complaining of dizzy spells that has been increasing in frequency and he had one episode of near-syncope, after he was  walking his dog. The patient is not aware of palpitations at that time. The patient isn't denies symptoms of orthopnea, paroxysmal nocturnal dyspnea, lower extremity edema.  09/03/2015 - 1 year follow-up. The patient denies any palpitations or syncope. He stays active, however doesn't exercise and eats out and eats fried food a lot. No LE edema, orthopnea, PND. He denies any chest pain or shortness of breath. He is complaint with his meds.  10/07/2016 -in December 2017 he had elevated LDL, but improved from 151 to 128, TG very elevated at 324, started on fenofibrate 130 mg po once daily. Normal LFTs, Crea 1.24. He is tolerating fenofibrate very well but had such bad side effects with statins that he doesn't want to start them again. He has been on a low-carb diet and has lost 10 pounds in the last year his goal is to lose another 40 pounds. 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 No hospitalizations since the last visit.  11/11/2017 - 1 year follow up, the patient feels well, he stays active, denies SOB, CP, no palpitations, dizziness or syncope. No LE edema, orthopnea, compliant with meds and has no side effects.  Home Medications  Prior to Admission medications   Medication Sig Start Date End Date Taking? Authorizing Provider  budesonide-formoterol (SYMBICORT) 160-4.5 MCG/ACT inhaler Inhale 2 puffs into the lungs 2 (two) times daily. 06/02/13  Mary-Margaret Hassell Done, FNP  citalopram (CELEXA) 20 MG tablet Take 1 tablet (20 mg total) by mouth daily. 06/02/13   Mary-Margaret Hassell Done, FNP  fenofibrate 160 MG tablet Take 1 tablet (160 mg total) by mouth daily. 06/02/13   Mary-Margaret Hassell Done, FNP  montelukast (SINGULAIR) 10 MG tablet Take 1 tablet (10 mg total) by mouth at bedtime. 06/02/13   Mary-Margaret Hassell Done, FNP  Naproxen Sodium (NAPRELAN) 750 MG TB24 Take 750 mg by mouth daily.    Historical Provider, MD    simvastatin (ZOCOR) 40 MG tablet Take 1 tablet (40 mg total) by mouth at bedtime. 06/02/13   Mary-Margaret Hassell Done, FNP    Family History  Family History  Problem Relation Age of Onset  . Heart disease Unknown        Early Cardiac disease for uncle is their 27's as well as deceased.  Marland Kitchen Heart failure Mother   . Dementia Mother 52  . Cancer Father 56       testicular, lung  . Esophageal cancer Brother     Social History  Social History   Socioeconomic History  . Marital status: Married    Spouse name: [name]  . Number of children: 3  . Years of education: Bachelors degree  . Highest education level: Bachelor's degree (e.g., BA, AB, BS)  Social Needs  . Financial resource strain: Not hard at all  . Food insecurity - worry: Never true  . Food insecurity - inability: Never true  . Transportation needs - medical: No  . Transportation needs - non-medical: No  Occupational History  . Occupation: Retired    Comment: Nature conservation officer  Tobacco Use  . Smoking status: Former Smoker    Packs/day: 1.00    Years: 20.00    Pack years: 20.00    Types: Cigarettes    Last attempt to quit: 01/19/1996    Years since quitting: 21.8  . Smokeless tobacco: Never Used  Substance and Sexual Activity  . Alcohol use: Yes    Alcohol/week: 1.2 oz    Types: 2 Cans of beer per week  . Drug use: No  . Sexual activity: Yes  Other Topics Concern  . Not on file  Social History Narrative   Patient is married and lives at home with is wife. He has three adult children and five grandchildren. He is retired from [employer] and has a bachelor's degree.      Review of Systems, as per history of present illness otherwise negative General:  No chills, fever, night sweats or weight changes.  Cardiovascular:  No chest pain, dyspnea on exertion, edema, orthopnea, palpitations, paroxysmal nocturnal dyspnea. Dermatological: No rash, lesions/masses Respiratory: No cough, dyspnea Urologic: No hematuria,  dysuria Abdominal:   No nausea, vomiting, diarrhea, bright red blood per rectum, melena, or hematemesis Neurologic:  No visual changes, wkns, changes in mental status. All other systems reviewed and are otherwise negative except as noted above.  Physical Exam  BP 142/70, HR 70 BPM General: Pleasant, NAD Psych: Normal affect. Neuro: Alert and oriented X 3. Moves all extremities spontaneously. HEENT: Normal  Neck: Supple without bruits or JVD. Lungs:  Resp regular and unlabored, CTA. Heart: RRR no s3, s4, or murmurs. Abdomen: Soft, non-tender, non-distended, BS + x 4.  Extremities: No clubbing, cyanosis or edema. DP/PT/Radials 2+ and equal bilaterally.  Accessory Clinical Findings  ECG - sinus rhythm 61 beats per minute, normal EKG   TTE: 07/2013 Left ventricle: The cavity size was normal. Wall thickness was increased in a  pattern of moderate LVH. Systolic function was normal. The estimated ejection fraction was in the range of 50% to 55%. Wall motion was normal; there were no regional wall motion abnormalities. Doppler parameters are consistent with abnormal left ventricular relaxation (grade 1 diastolic dysfunction). Left atrium: The atrium was mildly dilated.  EKG- done today 11/11/17 shows SB, NSIVCD, unchanged from prior, personally reviewed   Assessment & Plan  1. Paroxysmal atrial fibrillation diagnosed in 2014 - no recurrent event, now in SR, E-cardio monitor showed 0% a-fib.  Echocardiogram shows mild LA enlargement. On aspirin 81 mg po daily for now. TSH normal.  2. Hypertension - well controlled  3. Hyperlipidemia - intolerant to simva- and atorvastatin, finally able to tolerate rosuvastatin with minimal side effects, we will repeat labs, I might consider decreasing the dose if results good.  Follow up in 1 year, CMP, lipids, TSH, CBC now.  Ena Dawley, MD 11/13/2017, 9:03 PM

## 2017-11-11 NOTE — Patient Instructions (Signed)
Medication Instructions:   Your physician recommends that you continue on your current medications as directed. Please refer to the Current Medication list given to you today.    Labwork:  NEXT Monday 11/15/17--WE WILL CHECK --CMET, TSH, CBC W DIFF, AND LIPIDS--PLEASE COME FASTING TO THIS LAB APPOINTMENT      Follow-Up:  Your physician wants you to follow-up in: [facility] will receive a reminder letter in the mail two months in advance. If you don't receive a letter, please call our office to schedule the follow-up appointment.        If you need a refill on your cardiac medications before your next appointment, please call your pharmacy.

## 2017-11-15 ENCOUNTER — Other Ambulatory Visit: Payer: Medicare Other

## 2017-11-17 ENCOUNTER — Other Ambulatory Visit: Payer: Medicare Other | Admitting: *Deleted

## 2017-11-17 DIAGNOSIS — I10 Essential (primary) hypertension: Secondary | ICD-10-CM

## 2017-11-17 DIAGNOSIS — E785 Hyperlipidemia, unspecified: Secondary | ICD-10-CM

## 2017-11-17 LAB — COMPREHENSIVE METABOLIC PANEL
ALT: 17 IU/L (ref 0–44)
AST: 20 IU/L (ref 0–40)
Albumin/Globulin Ratio: 1.7 (ref 1.2–2.2)
Albumin: 4.3 g/dL (ref 3.5–4.8)
Alkaline Phosphatase: 38 IU/L — ABNORMAL LOW (ref 39–117)
BUN/Creatinine Ratio: 13 (ref 10–24)
BUN: 18 mg/dL (ref 8–27)
Bilirubin Total: 0.3 mg/dL (ref 0.0–1.2)
CO2: 27 mmol/L (ref 20–29)
Calcium: 10 mg/dL (ref 8.6–10.2)
Chloride: 99 mmol/L (ref 96–106)
Creatinine, Ser: 1.42 mg/dL — ABNORMAL HIGH (ref 0.76–1.27)
GFR calc Af Amer: 57 mL/min/{1.73_m2} — ABNORMAL LOW (ref >59)
GFR calc non Af Amer: 49 mL/min/{1.73_m2} — ABNORMAL LOW (ref >59)
Globulin, Total: 2.5 g/dL (ref 1.5–4.5)
Glucose: 113 mg/dL — ABNORMAL HIGH (ref 65–99)
Potassium: 4.8 mmol/L (ref 3.5–5.2)
Sodium: 138 mmol/L (ref 134–144)
Total Protein: 6.8 g/dL (ref 6.0–8.5)

## 2017-11-17 LAB — CBC WITH DIFFERENTIAL/PLATELET
Basophils Absolute: 0 10*3/uL (ref 0.0–0.2)
Basos: 0 %
EOS (ABSOLUTE): 0.2 10*3/uL (ref 0.0–0.4)
Eos: 3 %
Hematocrit: 39.5 % (ref 37.5–51.0)
Hemoglobin: 13.4 g/dL (ref 13.0–17.7)
Immature Grans (Abs): 0 10*3/uL (ref 0.0–0.1)
Immature Granulocytes: 0 %
Lymphocytes Absolute: 2.9 10*3/uL (ref 0.7–3.1)
Lymphs: 41 %
MCH: 31.5 pg (ref 26.6–33.0)
MCHC: 33.9 g/dL (ref 31.5–35.7)
MCV: 93 fL (ref 79–97)
Monocytes Absolute: 0.5 10*3/uL (ref 0.1–0.9)
Monocytes: 6 %
Neutrophils Absolute: 3.5 10*3/uL (ref 1.4–7.0)
Neutrophils: 50 %
Platelets: 239 10*3/uL (ref 150–379)
RBC: 4.26 x10E6/uL (ref 4.14–5.80)
RDW: 14.2 % (ref 12.3–15.4)
WBC: 7 10*3/uL (ref 3.4–10.8)

## 2017-11-17 LAB — LIPID PANEL
Chol/HDL Ratio: 2.1 ratio (ref 0.0–5.0)
Cholesterol, Total: 106 mg/dL (ref 100–199)
HDL: 51 mg/dL (ref >39)
LDL Calculated: 41 mg/dL (ref 0–99)
Triglycerides: 70 mg/dL (ref 0–149)
VLDL Cholesterol Cal: 14 mg/dL (ref 5–40)

## 2017-11-17 LAB — TSH: TSH: 2.57 u[IU]/mL (ref 0.450–4.500)

## 2017-11-18 ENCOUNTER — Ambulatory Visit (INDEPENDENT_AMBULATORY_CARE_PROVIDER_SITE_OTHER): Payer: Medicare Other | Admitting: Physician Assistant

## 2017-11-18 ENCOUNTER — Ambulatory Visit (INDEPENDENT_AMBULATORY_CARE_PROVIDER_SITE_OTHER): Payer: Medicare Other

## 2017-11-18 ENCOUNTER — Encounter (INDEPENDENT_AMBULATORY_CARE_PROVIDER_SITE_OTHER): Payer: Self-pay | Admitting: Physician Assistant

## 2017-11-18 DIAGNOSIS — M1711 Unilateral primary osteoarthritis, right knee: Secondary | ICD-10-CM

## 2017-11-18 DIAGNOSIS — G8929 Other chronic pain: Secondary | ICD-10-CM

## 2017-11-18 DIAGNOSIS — M1712 Unilateral primary osteoarthritis, left knee: Secondary | ICD-10-CM | POA: Diagnosis not present

## 2017-11-18 DIAGNOSIS — M25511 Pain in right shoulder: Secondary | ICD-10-CM

## 2017-11-18 MED ORDER — HYALURONAN 88 MG/4ML IX SOSY
88.0000 mg | PREFILLED_SYRINGE | INTRA_ARTICULAR | Status: AC | PRN
  Administered 2017-11-18: 88 mg via INTRA_ARTICULAR

## 2017-11-18 MED ORDER — NAPROXEN 500 MG PO TABS: ORAL_TABLET | ORAL | 5 refills | Status: DC

## 2017-11-18 MED ORDER — LIDOCAINE HCL 1 % IJ SOLN
3.0000 mL | INTRAMUSCULAR | Status: AC | PRN
  Administered 2017-11-18: 3 mL

## 2017-11-18 MED ORDER — METHYLPREDNISOLONE ACETATE 40 MG/ML IJ SUSP
40.0000 mg | INTRAMUSCULAR | Status: AC | PRN
  Administered 2017-11-18: 40 mg via INTRA_ARTICULAR

## 2017-11-18 NOTE — Progress Notes (Signed)
Office Visit Note   Patient: [name] [name]           Date of Birth: Jul 11, 1946           MRN: [id number] Visit Date: 11/18/2017              Requested by: Chevis Pretty, [address], [place] [address] PCP: Chevis Pretty, FNP   Assessment & Plan: Visit Diagnoses:  1. Chronic right shoulder pain   2. Unilateral primary osteoarthritis, right knee   3. Unilateral primary osteoarthritis, left knee     Plan: Patient will call to have his Monovisc is ordered at 6 months and then return for repeat Monovisc injections both knees.  In regards to his right shoulder pain he will continue to do the shoulder exercises shown last time.  If despite the injection and the exercises he continues to have pain in the shoulder he will call and we will order an MRI of his right shoulder to rule out rotator cuff tear.  Questions were encouraged and answered at length today.  Follow-Up Instructions: Return in about 6 months (around 05/21/2018).   Orders:  Orders Placed This Encounter  Procedures  . Large Joint Inj  . Large Joint Inj  . XR Shoulder Right   Meds ordered this encounter  Medications  . naproxen (NAPROSYN) 500 MG tablet    Sig: TAKE  (1)  TABLET TWICE A DAY WITH FOOD.    Dispense:  60 tablet    Refill:  5      Procedures: Large Joint Inj: bilateral knee on 11/18/2017 4:55 PM Indications: pain Details: 22 G 1.5 in needle, anterolateral approach  Arthrogram: No  Medications (Right): 88 mg Hyaluronan 88 MG/4ML Medications (Left): 88 mg Hyaluronan 88 MG/4ML Outcome: tolerated well, no immediate complications Procedure, treatment alternatives, risks and benefits explained, specific risks discussed. Consent was given by the patient. Immediately prior to procedure a time out was called to verify the correct patient, procedure, equipment, support staff and site/side marked as required. Patient was prepped and draped in the usual sterile fashion.   Large  Joint Inj: R subacromial bursa on 11/18/2017 4:56 PM Indications: pain Details: 22 G 1.5 in needle, superior approach  Arthrogram: No  Medications: 3 mL lidocaine 1 %; 40 mg methylPREDNISolone acetate 40 MG/ML Outcome: tolerated well, no immediate complications Procedure, treatment alternatives, risks and benefits explained, specific risks discussed. Consent was given by the patient. Immediately prior to procedure a time out was called to verify the correct patient, procedure, equipment, support staff and site/side marked as required. Patient was prepped and draped in the usual sterile fashion.       Clinical Data: No additional findings.   Subjective: Chief Complaint  Patient presents with  . Left Knee - Injections, Pain  . Right Knee - Pain, Injections    HPI [name] [name] returns today for injections both knees with Monovisc.  He also is requesting an injection in his right shoulder.  Again he has had pain in the right shoulder for at least 3 months.  He feels this may be related to his dog jerking him whenever he is on a leash.  He denies any radicular symptoms down the right arm.  No neck pain.  Right shoulder pain does awaken him though. Review of Systems See HPI otherwise negative  Objective: Vital Signs: There were no vitals taken for this visit.  Physical Exam  Constitutional: He is oriented to person, place, and  time. He appears well-developed and well-nourished. No distress.  Pulmonary/Chest: Effort normal.  Neurological: He is alert and oriented to person, place, and time.  Skin: He is not diaphoretic.  Psychiatric: He has a normal mood and affect.    Ortho Exam Bilateral knees good range of motion.  No effusion abnormal warmth or erythema. Bilateral shoulders 5 out of 5 strength with external and internal rotation against resistance right shoulder negative impingement testing.  Empty can test negative bilaterally.  Tenderness over the bicipital groove region of the  right shoulder only.  Negative crossover test bilaterally. Specialty Comments:  No specialty comments available.  Imaging: Xr Shoulder Right  Result Date: 11/18/2017 Right shoulder 3 views: No acute fracture.  Shoulders well located.  Glenohumeral joint well-maintained.  Significant AC joint arthritis.  Otherwise no bony abnormalities.    PMFS History: Patient Active Problem List   Diagnosis Date Noted  . Unilateral primary osteoarthritis, left knee 11/25/2016  . Unilateral primary osteoarthritis, right knee 11/25/2016  . Essential hypertension 06/28/2015  . H/O prostate cancer 06/28/2015  . Hereditary and idiopathic peripheral neuropathy 04/17/2015  . Carpal tunnel syndrome 04/17/2015  . Hyperlipemia 06/02/2013  . Asthma, chronic 06/02/2013  . Depression 06/02/2013  . GERD 06/04/2009  . LEG PAIN, BILATERAL 06/04/2009  . DYSPNEA ON EXERTION 06/04/2009  . PERCUTANEOUS TRANSLUMINAL CORONARY ANGIOPLASTY, HX OF 06/04/2009   Past Medical History:  Diagnosis Date  . Asthma, chronic 06/02/2013  . Carpal tunnel syndrome 04/17/2015   Bilateral  . Depression 06/02/2013  . DYSPNEA ON EXERTION 06/04/2009   Qualifier: Diagnosis of  By: Domenic Polite, MD, Phillips Hay   . GERD (gastroesophageal reflux disease)   . Hereditary and idiopathic peripheral neuropathy 04/17/2015  . Hyperlipemia 06/02/2013  . LEG PAIN, BILATERAL 06/04/2009   Qualifier: Diagnosis of  By: Domenic Polite, MD, Phillips Hay   . PERCUTANEOUS TRANSLUMINAL CORONARY ANGIOPLASTY, HX OF 06/04/2009   Qualifier: Diagnosis of  By: Domenic Polite, MD, Phillips Hay   . Prostate CA [facility])     Family History  Problem Relation Age of Onset  . Heart disease Unknown        Early Cardiac disease for uncle is their 41's as well as deceased.  Marland Kitchen Heart failure Mother   . Dementia Mother 26  . Cancer Father 33       testicular, lung  . Esophageal cancer Brother     Past Surgical History:  Procedure Laterality Date  . CARDIAC  CATHETERIZATION  1997  . HIATAL HERNIA REPAIR    . LAPAROSCOPIC GASTRIC BANDING  2000   [facility]   . ROBOT ASSISTED LAPAROSCOPIC RADICAL PROSTATECTOMY     Social History   Occupational History  . Occupation: Retired    Comment: Nature conservation officer  Tobacco Use  . Smoking status: Former Smoker    Packs/day: 1.00    Years: 20.00    Pack years: 20.00    Types: Cigarettes    Last attempt to quit: 01/19/1996    Years since quitting: 21.8  . Smokeless tobacco: Never Used  Substance and Sexual Activity  . Alcohol use: Yes    Alcohol/week: 1.2 oz    Types: 2 Cans of beer per week  . Drug use: No  . Sexual activity: Yes

## 2017-11-19 ENCOUNTER — Telehealth: Payer: Self-pay | Admitting: *Deleted

## 2017-11-19 MED ORDER — ROSUVASTATIN CALCIUM 5 MG PO TABS: 5.0000 mg | ORAL_TABLET | Freq: Every day | ORAL | 3 refills | Status: DC

## 2017-11-19 NOTE — Telephone Encounter (Signed)
Notified the pt that per Dr [name], his labs showed that his lipids are excellent, and she will decrease his crestor from 10 mg to 5 mg po daily.  Did inform the pt that his creatinine was slightly elevated, and she recommends that he increase his fluid intake.  Informed the pt that he had normal TSH and his CBC was stable.  Confirmed the pharmacy of choice with the pt.  Pt verbalized understanding and agrees with this plan.

## 2017-11-19 NOTE — Telephone Encounter (Signed)
-----   Message from Dorothy Spark, MD sent at 11/18/2017 10:30 AM EST ----- Excellent lipids, I will decrease Crestor to 5 mg daily, slightly elevated creatinine, I would increase fluid intake. Normal TSH. Stable CBC.

## 2017-12-09 ENCOUNTER — Ambulatory Visit (INDEPENDENT_AMBULATORY_CARE_PROVIDER_SITE_OTHER): Payer: Medicare Other | Admitting: *Deleted

## 2017-12-09 DIAGNOSIS — Z23 Encounter for immunization: Secondary | ICD-10-CM

## 2017-12-09 NOTE — Progress Notes (Signed)
Shingrix Vaccine given in Left deltoid. Patient tolerated well. Pt will return in 2 months for 2nd vaccine. Told patient to call to make sure we had it in stock since it had been on backorder.

## 2017-12-09 NOTE — Patient Instructions (Signed)
Recombinant Zoster (Shingles) Vaccine, RZV: What You Need to Know    1. Why get vaccinated?  Shingles (also called herpes zoster, or just zoster) is a painful skin rash, often with blisters. Shingles is caused by the varicella zoster virus, the same virus that causes chickenpox. After you have chickenpox, the virus stays in your body and can cause shingles later in life.  You can't catch shingles from another person. However, a person who has never had chickenpox (or chickenpox vaccine) could get chickenpox from someone with shingles.  A shingles rash usually appears on one side of the face or body and heals within 2 to 4 weeks. Its main symptom is pain, which can be severe. Other symptoms can include fever, headache, chills and upset stomach. Very rarely, a shingles infection can lead to pneumonia, hearing problems, blindness, brain inflammation (encephalitis), or death.  For about 1 person in 5, severe pain can continue even long after the rash has cleared up. This long-lasting pain is called post-herpetic neuralgia (PHN).  Shingles is far more common in people 50 years of age and older than in younger people, and the risk increases with age. It is also more common in people whose immune system is weakened because of a disease such as cancer, or by drugs such as steroids or chemotherapy.  At least 1 million people a year in the [place] get shingles.    2. Shingles vaccine (recombinant)  Recombinant shingles vaccine was approved by FDA in 2017 for the prevention of shingles. In clinical trials, it was more than 90% effective in preventing shingles. It can also reduce the likelihood of PHN.  Two doses, 2 to 6 months apart, are recommended for adults 50 and older.  This vaccine is also recommended for people who have already gotten the live shingles vaccine (Zostavax). There is no live virus in this vaccine.    3. Some people should not get this vaccine  Tell your vaccine provider if you:   Have any  severe, life-threatening allergies. A person who has ever had a life-threatening allergic reaction after a dose of recombinant shingles vaccine, or has a severe allergy to any component of this vaccine, may be advised not to be vaccinated. Ask your health care provider if you want information about vaccine components.   Are pregnant or breastfeeding. There is not much information about use of recombinant shingles vaccine in pregnant or nursing women. Your healthcare provider might recommend delaying vaccination.   Are not feeling well. If you have a mild illness, such as a cold, you can probably get the vaccine today. If you are moderately or severely ill, you should probably wait until you recover. Your doctor can advise you.    4. Risks of a vaccine reaction  With any medicine, including vaccines, there is a chance of reactions.  After recombinant shingles vaccination, a person might experience:   Pain, redness, soreness, or swelling at the site of the injection   Headache, muscle aches, fever, shivering, fatigue    In clinical trials, most people got a sore arm with mild or moderate pain after vaccination, and some also had redness and swelling where they got the shot. Some people felt tired, had muscle pain, a headache, shivering, fever, stomach pain, or nausea. About 1 out of 6 people who got recombinant zoster vaccine experienced side effects that prevented them from doing regular activities. Symptoms went away on their own in about 2 to 3 days. Side effects were more   common in younger people.  You should still get the second dose of recombinant zoster vaccine even if you had one of these reactions after the first dose.    Other things that could happen after this vaccine:   People sometimes faint after medical procedures, including vaccination. Sitting or lying down for about 15 minutes can help prevent fainting and injuries caused by a fall. Tell your provider if you feel dizzy or have vision changes or  ringing in the ears.   Some people get shoulder pain that can be more severe and longer-lasting than routine soreness that can follow injections. This happens very rarely.   Any medication can cause a severe allergic reaction. Such reactions to a vaccine are estimated at about 1 in a million doses, and would happen within a few minutes to a few hours after the vaccination.  As with any medicine, there is a very remote chance of a vaccine causing a serious injury or death.  The safety of vaccines is always being monitored. For more information, visit: www.cdc.gov/vaccinesafety/    5. What if there is a serious problem?  What should I look for?   Look for anything that concerns you, such as signs of a severe allergic reaction, very high fever, or unusual behavior.  Signs of a severe allergic reaction can include hives, swelling of the face and throat, difficulty breathing, a fast heartbeat, dizziness, and weakness. These would usually start a few minutes to a few hours after the vaccination.    What should I do?   If you think it is a severe allergic reaction or other emergency that can't wait, call 9-1-1 and get to the nearest hospital. Otherwise, call your health care provider.  Afterward, the reaction should be reported to the Vaccine Adverse Event Reporting System (VAERS). Your doctor should file this report, or you can do it yourself through the VAERS web site atwww.vaers.hhs.govor by calling 1-800-822-7967.  VAERS does not give medical advice.    6. How can I learn more?   Ask your healthcare provider. He or she can give you the vaccine package insert or suggest other sources of information.   Call your local or state health department.   Contact the Centers for Disease Control and Prevention (CDC):  ? Call 1-800-232-4636 (1-800-CDC-INFO) or  ? Visit the CDC's website at www.cdc.gov/vaccines  ?   CDC Vaccine Information Statement (VIS) Recombinant Zoster Vaccine (10/26/2016)  This information is not  intended to replace advice given to you by your health care provider. Make sure you discuss any questions you have with your health care provider.  Document Released: 11/10/2016 Document Revised: 11/10/2016 Document Reviewed: 11/10/2016  Elsevier Interactive Patient Education  2018 Elsevier Inc.

## 2018-01-07 ENCOUNTER — Ambulatory Visit (INDEPENDENT_AMBULATORY_CARE_PROVIDER_SITE_OTHER): Payer: Medicare Other | Admitting: Family Medicine

## 2018-01-07 ENCOUNTER — Encounter: Payer: Self-pay | Admitting: Family Medicine

## 2018-01-07 VITALS — BP 121/65 | HR 60 | Temp 98.2°F | Ht 74.0 in | Wt 227.2 lb

## 2018-01-07 DIAGNOSIS — H6981 Other specified disorders of Eustachian tube, right ear: Secondary | ICD-10-CM

## 2018-01-07 NOTE — Progress Notes (Signed)
Subjective: CC: URI PCP: [name] [name], [name] [name] is a 72 y.o. male presenting to clinic today for:  1. URI Patient notes a 4-day history of itchy eyes, right-sided ear discomfort and right-sided sore throat.  He notes nasal congestion.  He reports that his right ear typically is the side that gives him issues.  Symptoms seem to ease off some after using Zyrtec the but he notes that the medication made him jittery and made it difficult to sleep.  He used peroxide combined with rubbing alcohol solution in the right ear in efforts to reduce symptoms but notes that he became dizzy after using this medication.  He is also used a nasal spray in efforts to improve congestion but notes that it is not lasting very long.  Additionally, he has been using milking maneuvers in efforts to improve eustachian tube drainage.  He is expecting to go on a trip tomorrow and is worried that symptoms may cause dizziness and impair his ability to drive.  He does wish to see an ear nose and throat doctor given the recurrence of the right-sided ear issue.  He denies fevers, chills, nausea, vomiting.   ROS: Per HPI  No Known Allergies Past Medical History:  Diagnosis Date  . Asthma, chronic 06/02/2013  . Carpal tunnel syndrome 04/17/2015   Bilateral  . Depression 06/02/2013  . DYSPNEA ON EXERTION 06/04/2009   Qualifier: Diagnosis of  By: Domenic Polite, MD, Phillips Hay   . GERD (gastroesophageal reflux disease)   . Hereditary and idiopathic peripheral neuropathy 04/17/2015  . Hyperlipemia 06/02/2013  . LEG PAIN, BILATERAL 06/04/2009   Qualifier: Diagnosis of  By: Domenic Polite, MD, Phillips Hay   . PERCUTANEOUS TRANSLUMINAL CORONARY ANGIOPLASTY, HX OF 06/04/2009   Qualifier: Diagnosis of  By: Domenic Polite, MD, Phillips Hay   . Prostate CA [facility])     Current Outpatient Medications:  .  amLODipine (NORVASC) 5 MG tablet, Take 1 tablet (5 mg total) by mouth daily., Disp: 90 tablet, Rfl:  2 .  aspirin EC 81 MG tablet, Take 81 mg by mouth daily., Disp: , Rfl:  .  atenolol (TENORMIN) 25 MG tablet, Take 1 tablet (25 mg total) by mouth daily., Disp: 90 tablet, Rfl: 2 .  budesonide-formoterol (SYMBICORT) 160-4.5 MCG/ACT inhaler, Inhale 2 puffs into the lungs 2 (two) times daily., Disp: 3 Inhaler, Rfl: 3 .  cetirizine (ZYRTEC) 10 MG tablet, Take 10 mg by mouth daily., Disp: , Rfl:  .  cholecalciferol (VITAMIN D) 1000 UNITS tablet, Take 1,000 Units by mouth daily., Disp: , Rfl:  .  citalopram (CELEXA) 20 MG tablet, TAKE 1 TABLET DAILY, Disp: 90 tablet, Rfl: 0 .  cyanocobalamin (,VITAMIN B-12,) 1000 MCG/ML injection, INJECT 1ML EVERY MONTH, Disp: 3 mL, Rfl: 0 .  fenofibrate (TRICOR) 145 MG tablet, TAKE (1) TABLET DAILY BEFORE BREAKFAST., Disp: 90 tablet, Rfl: 2 .  loperamide (IMODIUM) 2 MG capsule, Take 2 mg by mouth daily., Disp: , Rfl:  .  montelukast (SINGULAIR) 10 MG tablet, TAKE 1 TABLET DAILY, Disp: 90 tablet, Rfl: 0 .  Multiple Vitamins-Minerals (MULTIVITAMIN WITH MINERALS) tablet, Take 1 tablet by mouth daily., Disp: , Rfl:  .  naproxen (NAPROSYN) 500 MG tablet, TAKE  (1)  TABLET TWICE A DAY WITH FOOD., Disp: 60 tablet, Rfl: 5 .  rosuvastatin (CRESTOR) 5 MG tablet, Take 1 tablet (5 mg total) by mouth daily., Disp: 90 tablet, Rfl: 3 .  sildenafil (REVATIO) 20 MG tablet, Take 20 mg by mouth  3 (three) times daily as needed., Disp: , Rfl:  Social History   Socioeconomic History  . Marital status: Married    Spouse name: [name]  . Number of children: 3  . Years of education: Bachelors degree  . Highest education level: Bachelor's degree (e.g., BA, AB, BS)  Occupational History  . Occupation: Retired    Comment: Wellsite geologist  . Financial resource strain: Not hard at all  . Food insecurity:    Worry: Never true    Inability: Never true  . Transportation needs:    Medical: No    Non-medical: No  Tobacco Use  . Smoking status: Former Smoker    Packs/day: 1.00     Years: 20.00    Pack years: 20.00    Types: Cigarettes    Last attempt to quit: 01/19/1996    Years since quitting: 21.9  . Smokeless tobacco: Never Used  Substance and Sexual Activity  . Alcohol use: Yes    Alcohol/week: 1.2 oz    Types: 2 Cans of beer per week  . Drug use: No  . Sexual activity: Yes  Lifestyle  . Physical activity:    Days per week: 7 days    Minutes per session: 60 min  . Stress: Not at all  Relationships  . Social connections:    Talks on phone: More than three times a week    Gets together: More than three times a week    Attends religious service: Never    Active member of club or organization: Yes    Attends meetings of clubs or organizations: More than 4 times per year    Relationship status: Married  . Intimate partner violence:    Fear of current or ex partner: No    Emotionally abused: No    Physically abused: No    Forced sexual activity: No  Other Topics Concern  . Not on file  Social History Narrative   Patient is married and lives at home with is wife. He has three adult children and five grandchildren. He is retired from [employer] and has a bachelor's degree.    Family History  Problem Relation Age of Onset  . Heart disease Unknown        Early Cardiac disease for uncle is their 56's as well as deceased.  Marland Kitchen Heart failure Mother   . Dementia Mother 3  . Cancer Father 18       testicular, lung  . Esophageal cancer Brother     Objective: Office vital signs reviewed. BP 121/65   Pulse 60   Temp 98.2 F (36.8 C) (Oral)   Ht 6\' 2"  (1.88 m)   Wt 227 lb 3.2 oz (103.1 kg)   SpO2 97%   BMI 29.17 kg/m   Physical Examination:  General: Awake, alert, well nourished, well appearing male, No acute distress HEENT: Normal    Neck: No masses palpated. No lymphadenopathy    Ears: Tympanic membranes intact, dulled light reflex bilaterally (R>L), no erythema, no bulging    Eyes: PERRLA, extraocular membranes intact, sclera white    Nose: nasal  turbinates moist, clear nasal discharge; deviated septum noted    Throat: moist mucus membranes, no erythema, no tonsillar exudate.  Airway is patent Cardio: regular rate and rhythm, S1S2 heard, no murmurs appreciated Pulm: clear to auscultation bilaterally, no wheezes, rhonchi or rales; normal work of breathing on room air  Assessment/ Plan: 72 y.o. male   1. Eustachian tube dysfunction,  right I suspect eustachian tube dysfunction.  No evidence of bacterial infection on exam today.  Symptoms seem to have correlated with seasonal changes and pollen.  Unlikely to be viral illness but still possibility even in the absence of fever.  Regardless, supportive care recommended at this time.  I did discuss with him to consider separating the decongestant from the oral antihistamine so as to promote better sleep and decreased jitteriness.  Per his request, I have placed a referral to ENT.  Home care instructions were reviewed.  Follow-up as needed. - Ambulatory referral to ENT   Orders Placed This Encounter  Procedures  . Ambulatory referral to ENT    Referral Priority:   Routine    Referral Type:   Consultation    Referral Reason:   Specialty Services Required    Requested Specialty:   Otolaryngology    Number of Visits Requested:   [place], [place] [phone]

## 2018-01-07 NOTE — Patient Instructions (Signed)
There is no evidence of infection on today's exam.  This appears to be a eustachian tube dysfunction.  Continue the milking techniques that we discussed to drain the eustachian tube.  Consider obtaining your oral antihistamine separate from the decongestant.  You can obtain pseudoephedrine as a 6-hour pill from behind the counter at the pharmacy if you find that the decongestant is helpful.  Consider sinus rinses.  I have placed a referral to ENT for you.   Eustachian Tube Dysfunction The eustachian tube connects the middle ear to the back of the nose. It regulates air pressure in the middle ear by allowing air to move between the ear and nose. It also helps to drain fluid from the middle ear space. When the eustachian tube does not function properly, air pressure, fluid, or both can build up in the middle ear. Eustachian tube dysfunction can affect one or both ears. What are the causes? This condition happens when the eustachian tube becomes blocked or cannot open normally. This may result from:  Ear infections.  Colds and other upper respiratory infections.  Allergies.  Irritation, such as from cigarette smoke or acid from the stomach coming up into the esophagus (gastroesophageal reflux).  Sudden changes in air pressure, such as from descending in an airplane.  Abnormal growths in the nose or throat, such as nasal polyps, tumors, or enlarged tissue at the back of the throat (adenoids).  What increases the risk? This condition may be more likely to develop in people who smoke and people who are overweight. Eustachian tube dysfunction may also be more likely to develop in children, especially children who have:  Certain birth defects of the mouth, such as cleft palate.  Large tonsils and adenoids.  What are the signs or symptoms? Symptoms of this condition may include:  A feeling of fullness in the ear.  Ear pain.  Clicking or popping noises in the ear.  Ringing in the  ear.  Hearing loss.  Loss of balance.  Symptoms may get worse when the air pressure around you changes, such as when you travel to an area of high elevation or fly on an airplane. How is this diagnosed? This condition may be diagnosed based on:  Your symptoms.  A physical exam of your ear, nose, and throat.  Tests, such as those that measure: ? The movement of your eardrum (tympanogram). ? Your hearing (audiometry).  How is this treated? Treatment depends on the cause and severity of your condition. If your symptoms are mild, you may be able to relieve your symptoms by moving air into ("popping") your ears. If you have symptoms of fluid in your ears, treatment may include:  Decongestants.  Antihistamines.  Nasal sprays or ear drops that contain medicines that reduce swelling (steroids).  In some cases, you may need to have a procedure to drain the fluid in your eardrum (myringotomy). In this procedure, a small tube is placed in the eardrum to:  Drain the fluid.  Restore the air in the middle ear space.  Follow these instructions at home:  Take over-the-counter and prescription medicines only as told by your health care provider.  Use techniques to help pop your ears as recommended by your health care provider. These may include: ? Chewing gum. ? Yawning. ? Frequent, forceful swallowing. ? Closing your mouth, holding your nose closed, and gently blowing as if you are trying to blow air out of your nose.  Do not do any of the following until your  health care provider approves: ? Travel to high altitudes. ? Fly in airplanes. ? Work in a Pension scheme manager or room. ? Scuba dive.  Keep your ears dry. Dry your ears completely after showering or bathing.  Do not smoke.  Keep all follow-up visits as told by your health care provider. This is important. Contact a health care provider if:  Your symptoms do not go away after treatment.  Your symptoms come back after  treatment.  You are unable to pop your ears.  You have: ? A fever. ? Pain in your ear. ? Pain in your head or neck. ? Fluid draining from your ear.  Your hearing suddenly changes.  You become very dizzy.  You lose your balance. This information is not intended to replace advice given to you by your health care provider. Make sure you discuss any questions you have with your health care provider. Document Released: 09/27/2015 Document Revised: 02/06/2016 Document Reviewed: 09/19/2014 Elsevier Interactive Patient Education  [employer].

## 2018-01-12 ENCOUNTER — Other Ambulatory Visit: Payer: Self-pay | Admitting: Nurse Practitioner

## 2018-01-12 DIAGNOSIS — J4520 Mild intermittent asthma, uncomplicated: Secondary | ICD-10-CM

## 2018-01-24 ENCOUNTER — Encounter (INDEPENDENT_AMBULATORY_CARE_PROVIDER_SITE_OTHER): Payer: Self-pay | Admitting: Orthopaedic Surgery

## 2018-01-24 ENCOUNTER — Ambulatory Visit (INDEPENDENT_AMBULATORY_CARE_PROVIDER_SITE_OTHER): Payer: Medicare Other | Admitting: Physician Assistant

## 2018-01-24 DIAGNOSIS — M25511 Pain in right shoulder: Secondary | ICD-10-CM

## 2018-01-24 DIAGNOSIS — G8929 Other chronic pain: Secondary | ICD-10-CM

## 2018-01-24 MED ORDER — HYDROCODONE-ACETAMINOPHEN 5-325 MG PO TABS: 1.0000 | ORAL_TABLET | Freq: Four times a day (QID) | ORAL | 0 refills | Status: DC | PRN

## 2018-01-24 NOTE — Progress Notes (Signed)
HPI: Mr. Geske returns today due to right shoulder pain.  He was last seen on 11/18/2017 was given a sub-acromial shot in the right shoulder.  Unfortunately he reinjured his shoulder while cranking lawnmower.  He cannot lift his arm secondary to pain.  Does note some bruising about the arm.  He has been taking some old oxycodone that he had left Norco that he also had leftover from prior injuries. No radicular symptoms down right arm  Review of systems: Please see HPI otherwise negative  Physical exam: Well-developed well-nourished male no acute distress holds his right arm at 90 degrees abducted to his chest wall. Right shoulder he has weakness with external rotation against resistance 3 out of 5 on the right 5 out of 5 on the left.  Internal rotation 5 out of 5 bilaterally.  Has good range of motion of the right elbow without pain positive impingement testing the right shoulder.  Nontender over the biceps and bicipital groove.  No rashes skin lesions ulcerations about shoulder girdle or proximal humerus.  Impression: Right shoulder pain and decreased range of motion  Plan: Due to patient's increased pain in the shoulder and now with decreased range of motion and decreased strength with external rotation will order an MRI of the right shoulder rule out rotator cuff tear.  Have him follow-up after the MRI to go over results and discuss further treatment.  He is given some Norco that he is to use sparingly for pain.  Questions encouraged and answered at length.

## 2018-01-24 NOTE — Addendum Note (Signed)
Addended by: Marlyne Beards on: 01/24/2018 12:43 PM   Modules accepted: Orders

## 2018-01-25 ENCOUNTER — Telehealth (INDEPENDENT_AMBULATORY_CARE_PROVIDER_SITE_OTHER): Payer: Self-pay | Admitting: *Deleted

## 2018-01-25 NOTE — Telephone Encounter (Signed)
Pt has appt scheduled for MRI at [facility] on Friday May 17 at 12pm, pt will need to arrive at 11:45 am to register. I C pt on both numbers and left message to return my call for appt information.

## 2018-01-28 ENCOUNTER — Ambulatory Visit (HOSPITAL_COMMUNITY): Payer: Medicare Other

## 2018-01-31 ENCOUNTER — Ambulatory Visit (HOSPITAL_COMMUNITY)
Admission: RE | Admit: 2018-01-31 | Discharge: 2018-01-31 | Disposition: A | Discharge status: Home / Self Care | Payer: Medicare Other | Source: Ambulatory Visit | Attending: Physician Assistant | Admitting: Physician Assistant

## 2018-01-31 DIAGNOSIS — M19011 Primary osteoarthritis, right shoulder: Secondary | ICD-10-CM | POA: Insufficient documentation

## 2018-01-31 DIAGNOSIS — M25511 Pain in right shoulder: Secondary | ICD-10-CM

## 2018-01-31 DIAGNOSIS — M75101 Unspecified rotator cuff tear or rupture of right shoulder, not specified as traumatic: Secondary | ICD-10-CM | POA: Insufficient documentation

## 2018-01-31 DIAGNOSIS — M75121 Complete rotator cuff tear or rupture of right shoulder, not specified as traumatic: Secondary | ICD-10-CM | POA: Diagnosis not present

## 2018-02-02 ENCOUNTER — Encounter (INDEPENDENT_AMBULATORY_CARE_PROVIDER_SITE_OTHER): Payer: Self-pay | Admitting: Physician Assistant

## 2018-02-02 ENCOUNTER — Ambulatory Visit (INDEPENDENT_AMBULATORY_CARE_PROVIDER_SITE_OTHER): Payer: Medicare Other | Admitting: Physician Assistant

## 2018-02-02 DIAGNOSIS — M75121 Complete rotator cuff tear or rupture of right shoulder, not specified as traumatic: Secondary | ICD-10-CM

## 2018-02-02 DIAGNOSIS — M19019 Primary osteoarthritis, unspecified shoulder: Secondary | ICD-10-CM | POA: Diagnosis not present

## 2018-02-02 MED ORDER — HYDROCODONE-ACETAMINOPHEN 5-325 MG PO TABS: 1.0000 | ORAL_TABLET | Freq: Four times a day (QID) | ORAL | 0 refills | Status: DC | PRN

## 2018-02-02 NOTE — Progress Notes (Signed)
Office Visit Note   Patient: [name] [name]           Date of Birth: 1946-08-12           MRN: [id number] Visit Date: 02/02/2018              Requested by: Chevis Pretty, [address] [place], [place] [address] PCP: Chevis Pretty, FNP   Assessment & Plan: Visit Diagnoses:  1. Complete tear of right rotator cuff, unspecified whether traumatic   2. AC joint arthropathy     Plan: And the fact that patient has significant pain in the shoulder he would like to proceed with a right shoulder arthroscopy with extensive debridement, subacromial decompression, distal clavicle resection and possible repair of the rotator cuff tears.  Risk including infection, nerve or vessel injury, prolonged pain worsening pain and possible inability to repair the rotator cuff tears was discussed with patient at length.  We will see him back 1 week postop.  Follow-Up Instructions: Return for 1 WEEK , post op.   Orders:  No orders of the defined types were placed in this encounter.  No orders of the defined types were placed in this encounter.     Procedures: No procedures performed   Clinical Data: No additional findings.   Subjective: Chief Complaint  Patient presents with  . Right Shoulder - Pain, Follow-up    HPI Mr. Morones returns today with increasing pain in the right shoulder describes as a steady achy pain with some sharp stabbing pains also.  Pain is worse with overhead movement and external rotation.  He states that he cannot live with this pain for long period of time.  He is asking for refill on his pain medications.  He had an MRI of the shoulder and is here today for review. MRI of the right shoulder showed a retracted tear of the supraspinatus and also a full-thickness tear involving the infraspinatus.  He had arthritic changes at the [facility] joint and a type II acromium. Review of Systems Denies any recent fevers chills shortness of breath chest  pain  Objective: Vital Signs: There were no vitals taken for this visit.  Physical Exam  Constitutional: He is oriented to person, place, and time. He appears well-developed and well-nourished. No distress.  Pulmonary/Chest: Effort normal.  Neurological: He is alert and oriented to person, place, and time.  Skin: He is not diaphoretic.    Ortho Exam  Specialty Comments:  No specialty comments available.  Imaging: No results found.   PMFS History: Patient Active Problem List   Diagnosis Date Noted  . Unilateral primary osteoarthritis, left knee 11/25/2016  . Unilateral primary osteoarthritis, right knee 11/25/2016  . Essential hypertension 06/28/2015  . H/O prostate cancer 06/28/2015  . Hereditary and idiopathic peripheral neuropathy 04/17/2015  . Carpal tunnel syndrome 04/17/2015  . Hyperlipemia 06/02/2013  . Asthma, chronic 06/02/2013  . Depression 06/02/2013  . GERD 06/04/2009  . LEG PAIN, BILATERAL 06/04/2009  . DYSPNEA ON EXERTION 06/04/2009  . PERCUTANEOUS TRANSLUMINAL CORONARY ANGIOPLASTY, HX OF 06/04/2009   Past Medical History:  Diagnosis Date  . Asthma, chronic 06/02/2013  . Carpal tunnel syndrome 04/17/2015   Bilateral  . Depression 06/02/2013  . DYSPNEA ON EXERTION 06/04/2009   Qualifier: Diagnosis of  By: Domenic Polite, MD, [facility]   . GERD (gastroesophageal reflux disease)   . Hereditary and idiopathic peripheral neuropathy 04/17/2015  . Hyperlipemia 06/02/2013  . LEG PAIN, BILATERAL 06/04/2009   Qualifier: Diagnosis  of  By: Domenic Polite, MD, [facility], Roney Marion   . PERCUTANEOUS TRANSLUMINAL CORONARY ANGIOPLASTY, HX OF 06/04/2009   Qualifier: Diagnosis of  By: Domenic Polite, MD, [facility]   . Prostate CA [facility])     Family History  Problem Relation Age of Onset  . Heart disease Unknown        Early Cardiac disease for uncle is their 46's as well as deceased.  Marland Kitchen Heart failure Mother   . Dementia Mother 40  . Cancer Father 23        testicular, lung  . Esophageal cancer Brother     Past Surgical History:  Procedure Laterality Date  . CARDIAC CATHETERIZATION  1997  . HIATAL HERNIA REPAIR    . LAPAROSCOPIC GASTRIC BANDING  2000   [facility]   . ROBOT ASSISTED LAPAROSCOPIC RADICAL PROSTATECTOMY     Social History   Occupational History  . Occupation: Retired    Comment: Nature conservation officer  Tobacco Use  . Smoking status: Former Smoker    Packs/day: 1.00    Years: 20.00    Pack years: 20.00    Types: Cigarettes    Last attempt to quit: 01/19/1996    Years since quitting: 22.0  . Smokeless tobacco: Never Used  Substance and Sexual Activity  . Alcohol use: Yes    Alcohol/week: 1.2 oz    Types: 2 Cans of beer per week  . Drug use: No  . Sexual activity: Yes

## 2018-02-03 ENCOUNTER — Encounter: Payer: Self-pay | Admitting: Orthopaedic Surgery

## 2018-02-03 DIAGNOSIS — M75121 Complete rotator cuff tear or rupture of right shoulder, not specified as traumatic: Secondary | ICD-10-CM | POA: Diagnosis not present

## 2018-02-03 DIAGNOSIS — Y999 Unspecified external cause status: Secondary | ICD-10-CM | POA: Diagnosis not present

## 2018-02-03 DIAGNOSIS — M7581 Other shoulder lesions, right shoulder: Secondary | ICD-10-CM | POA: Diagnosis not present

## 2018-02-03 DIAGNOSIS — S46011A Strain of muscle(s) and tendon(s) of the rotator cuff of right shoulder, initial encounter: Secondary | ICD-10-CM | POA: Diagnosis not present

## 2018-02-03 DIAGNOSIS — G8918 Other acute postprocedural pain: Secondary | ICD-10-CM | POA: Diagnosis not present

## 2018-02-03 DIAGNOSIS — X58XXXA Exposure to other specified factors, initial encounter: Secondary | ICD-10-CM | POA: Diagnosis not present

## 2018-02-04 ENCOUNTER — Other Ambulatory Visit: Payer: Self-pay | Admitting: Family Medicine

## 2018-02-04 DIAGNOSIS — F3342 Major depressive disorder, recurrent, in full remission: Secondary | ICD-10-CM

## 2018-02-08 ENCOUNTER — Ambulatory Visit: Payer: Medicare Other

## 2018-02-10 ENCOUNTER — Other Ambulatory Visit (INDEPENDENT_AMBULATORY_CARE_PROVIDER_SITE_OTHER): Payer: Self-pay | Admitting: *Deleted

## 2018-02-10 ENCOUNTER — Encounter (INDEPENDENT_AMBULATORY_CARE_PROVIDER_SITE_OTHER): Payer: Self-pay | Admitting: Physician Assistant

## 2018-02-10 ENCOUNTER — Ambulatory Visit (INDEPENDENT_AMBULATORY_CARE_PROVIDER_SITE_OTHER): Payer: Medicare Other | Admitting: Physician Assistant

## 2018-02-10 DIAGNOSIS — Z9889 Other specified postprocedural states: Secondary | ICD-10-CM

## 2018-02-10 DIAGNOSIS — M25511 Pain in right shoulder: Secondary | ICD-10-CM

## 2018-02-10 NOTE — Progress Notes (Signed)
HPI: [name] [name] comes in today status post right shoulder arthroscopy and attempted repair of rotator cuff tear.  He states overall his shoulder is 80% improved.  He states that he has some discomfort whenever he tries to sleep.  But overall he is very happy with the results thus far.  Physical exam: General well-developed well-nourished male in no acute distress Right shoulder he is able to come to approximately 170 degrees of forward flexion.  Port sites are all healing well no signs of infection.  He does have significant ecchymosis down the arm.  He has good range of motion elbow and full supination pronation of the forearm.  Impression: 1 week status post right shoulder arthroscopy with attempted rotator cuff repair  Plan: Sutures were removed from the port sites today.  We will send in physical therapy for range of motion strengthening his shoulder.  No heavy overhead lifting or repetitive motion overhead. folow up in 4 weeks.

## 2018-02-16 ENCOUNTER — Ambulatory Visit (INDEPENDENT_AMBULATORY_CARE_PROVIDER_SITE_OTHER): Payer: Medicare Other | Admitting: *Deleted

## 2018-02-16 DIAGNOSIS — Z23 Encounter for immunization: Secondary | ICD-10-CM

## 2018-02-16 NOTE — Progress Notes (Signed)
shingrix vaccine given Pt tolerated well

## 2018-02-17 ENCOUNTER — Encounter: Payer: Self-pay | Admitting: Physical Therapy

## 2018-02-17 ENCOUNTER — Ambulatory Visit: Payer: Medicare Other | Attending: Physician Assistant | Admitting: Physical Therapy

## 2018-02-17 ENCOUNTER — Other Ambulatory Visit: Payer: Self-pay

## 2018-02-17 DIAGNOSIS — M25511 Pain in right shoulder: Secondary | ICD-10-CM | POA: Diagnosis not present

## 2018-02-17 DIAGNOSIS — M25611 Stiffness of right shoulder, not elsewhere classified: Secondary | ICD-10-CM | POA: Insufficient documentation

## 2018-02-17 NOTE — Therapy (Signed)
[facility] [place], [place], [address] Phone: [phone]   Fax:  [phone]  Physical Therapy Evaluation  Patient Details  Name: [name] [name] MRN: [id number] Date of Birth: 13-Dec-1945 Referring Provider: Erskine Emery PA-C   Encounter Date: 02/17/2018  PT End of Session - 02/17/18 1228    Visit Number  1    Number of Visits  16    Date for PT Re-Evaluation  05/18/18    Authorization Type  FOTO AT LEAST EVERY 5TH VISIT, 10TH VISIT PROGRESS NOTE AND KX MODIFIER AFTER THE 15 VISIT.    PT Start Time  1121    PT Stop Time  1213    PT Time Calculation (min)  52 min    Activity Tolerance  Patient tolerated treatment well       Past Medical History:  Diagnosis Date  . Asthma, chronic 06/02/2013  . Carpal tunnel syndrome 04/17/2015   Bilateral  . Depression 06/02/2013  . DYSPNEA ON EXERTION 06/04/2009   Qualifier: Diagnosis of  By: Domenic Polite, MD, [facility]   . GERD (gastroesophageal reflux disease)   . Hereditary and idiopathic peripheral neuropathy 04/17/2015  . Hyperlipemia 06/02/2013  . LEG PAIN, BILATERAL 06/04/2009   Qualifier: Diagnosis of  By: Domenic Polite, MD, [facility]   . PERCUTANEOUS TRANSLUMINAL CORONARY ANGIOPLASTY, HX OF 06/04/2009   Qualifier: Diagnosis of  By: Domenic Polite, MD, [facility] Hay   . Prostate CA [facility])     Past Surgical History:  Procedure Laterality Date  . CARDIAC CATHETERIZATION  1997  . HIATAL HERNIA REPAIR    . LAPAROSCOPIC GASTRIC BANDING  2000   [facility]   . ROBOT ASSISTED LAPAROSCOPIC RADICAL PROSTATECTOMY      There were no vitals filed for this visit.   Subjective Assessment - 02/17/18 1247    Subjective  The patient reports a right shoulder injury about 3 months ago when a dog pulled on it's leash hard and jerked his right shoulder.  Sometime later he was cranking a lawnmower and hurt his shoulder again. He had surgery a couple od dyas ago andstates that his  biceps was frayed and torn as well as two rotator cuff tendons.  He states that they could not be repaired.  His resting pain-level is a 2/10 ut can rise to much higher levels with increased use.  Rest decreases his pain.       Pertinent History  CTS; angioplasty.    Patient Stated Goals  Improve mobility with decreased pain.    Currently in Pain?  Yes    Pain Score  2     Pain Orientation  Right    Pain Descriptors / Indicators  Aching;Dull;Discomfort    Pain Type  Surgical pain    Pain Onset  1 to 4 weeks ago    Pain Frequency  Constant    Aggravating Factors   See above.    Pain Relieving Factors  See above.         [facility] PT Assessment - 02/17/18 0001      Assessment   Medical Diagnosis  Acute right shoulder pain.    Referring Provider  Erskine Emery PA-C    Onset Date/Surgical Date  -- 3 months.  Surgery ~ 2 weeks ago.      Precautions   Precautions  -- Low-level ther ex due to non-repairable RT RTC.      Restrictions   Weight Bearing Restrictions  No  Balance Screen   Has the patient fallen in the past 6 months  No    Has the patient had a decrease in activity level because of a fear of falling?   No    Is the patient reluctant to leave their home because of a fear of falling?   No      Home Environment   Living Environment  Private residence      Prior Function   Level of Independence  Independent      Observation/Other Assessments   Observations  Ecchymotic in right bicep region.    Focus on Therapeutic Outcomes (FOTO)   53% limitation.      Posture/Postural Control   Posture/Postural Control  No significant limitations      ROM / Strength   AROM / PROM / Strength  AROM;Strength      AROM   Overall AROM Comments  Full flexion of right shoulder in supine and ER to 55 degrees.      Strength   Overall Strength Comments  Left shoulder strength graded only grossly at 3+/5 into ER/IR.  Right shoulder remarkable for antigravity movement.      Palpation    Palpation comment  Tender over right bicpital groove with referred pain into middle deltoid.      Ambulation/Gait   Gait Comments  WNL.                Objective measurements completed on examination: See above findings.      [facility] Adult PT Treatment/Exercise - 02/17/18 0001      Modalities   Modalities  Electrical Stimulation;Vasopneumatic      Electrical Stimulation   Electrical Stimulation Location  Right shoulder.    Electrical Stimulation Action  IFC    Electrical Stimulation Parameters  80-150 Hz x 20 minutes.    Electrical Stimulation Goals  Pain      Vasopneumatic   Number Minutes Vasopneumatic   -- 20.    Vasopnuematic Location   -- Right shoulder.    Vasopneumatic Pressure  Medium               PT Short Term Goals - 02/17/18 1518      PT SHORT TERM GOAL #1   Title  STG's=LTG's.        PT Long Term Goals - 02/17/18 1519      PT LONG TERM GOAL #1   Title  Ind with a HEP.    Time  8    Period  Weeks    Status  New      PT LONG TERM GOAL #2   Title  Perform ADL's with pain not > 2-3/10.    Time  8    Period  Weeks    Status  New      PT LONG TERM GOAL #3   Title  Active right shoulder ER to 80 degrees+ to allow for easily donning/doffing of apparel.    Time  8    Period  Weeks    Status  New             Plan - 02/17/18 1413    Clinical Impression Statement  The patient presents to OPPT s/p right shoulder shoulder.  He states he had two torn RTC tendons that could not be repaired and a Biceps tendon is injured as well.  Due to pain and limitations of range of motion has a loss of functional use of his right UE.  Patient will benefit from skilled physical therapy intervention.    Clinical Presentation  Stable    Clinical Decision Making  Low    Rehab Potential  Good    PT Frequency  2x / week    PT Duration  8 weeks    PT Treatment/Interventions  ADLs/Self Care Home Management;Cryotherapy;Electrical  Stimulation;Ultrasound;Moist Heat;Therapeutic activities;Therapeutic exercise;Patient/family education;Neuromuscular re-education;Manual techniques    PT Next Visit Plan  Begin with AAROM; pulleys; UBE and UE ranger.  Passive range of motion to increase right shoulder ER.  Begin with isometrics and then to yellow theraband.  E'stim and electrical stimulation.    Consulted and Agree with Plan of Care  Patient       Patient will benefit from skilled therapeutic intervention in order to improve the following deficits and impairments:  Decreased activity tolerance, Decreased range of motion, Pain, Decreased strength  Visit Diagnosis: Acute pain of right shoulder - Plan: PT plan of care cert/re-cert  Stiffness of right shoulder, not elsewhere classified - Plan: PT plan of care cert/re-cert     Problem List Patient Active Problem List   Diagnosis Date Noted  . H/O arthroscopy of shoulder 02/10/2018  . Unilateral primary osteoarthritis, left knee 11/25/2016  . Unilateral primary osteoarthritis, right knee 11/25/2016  . Essential hypertension 06/28/2015  . H/O prostate cancer 06/28/2015  . Hereditary and idiopathic peripheral neuropathy 04/17/2015  . Carpal tunnel syndrome 04/17/2015  . Hyperlipemia 06/02/2013  . Asthma, chronic 06/02/2013  . Depression 06/02/2013  . GERD 06/04/2009  . LEG PAIN, BILATERAL 06/04/2009  . DYSPNEA ON EXERTION 06/04/2009  . PERCUTANEOUS TRANSLUMINAL CORONARY ANGIOPLASTY, HX OF 06/04/2009    APPLE[place]ATE, [place] MPT 02/17/2018, 3:22 PM  [facility] [place], [place], [address] Phone: [phone]   Fax:  [phone]  Name: [name] [name] MRN: [id number] Date of Birth: April 03, 1946

## 2018-02-23 ENCOUNTER — Ambulatory Visit: Payer: Medicare Other | Admitting: Physical Therapy

## 2018-02-23 ENCOUNTER — Encounter: Payer: Self-pay | Admitting: Physical Therapy

## 2018-02-23 DIAGNOSIS — M25611 Stiffness of right shoulder, not elsewhere classified: Secondary | ICD-10-CM | POA: Diagnosis not present

## 2018-02-23 DIAGNOSIS — M25511 Pain in right shoulder: Secondary | ICD-10-CM | POA: Diagnosis not present

## 2018-02-23 NOTE — Therapy (Signed)
[facility] [place], [place], [address] Phone: [phone]   Fax:  [phone]  Physical Therapy Treatment  Patient Details  Name: [name] [name] MRN: [id number] Date of Birth: Sep 03, 1946 Referring Provider: Erskine Emery PA-C   Encounter Date: 02/23/2018  PT End of Session - 02/23/18 0956    Visit Number  2    Number of Visits  16    Date for PT Re-Evaluation  05/18/18    Authorization Type  FOTO AT LEAST EVERY 5TH VISIT, 10TH VISIT PROGRESS NOTE AND KX MODIFIER AFTER THE 15 VISIT.    PT Start Time  0945    PT Stop Time  1033    PT Time Calculation (min)  48 min    Activity Tolerance  Patient tolerated treatment well    Behavior During Therapy  WFL for tasks assessed/performed       Past Medical History:  Diagnosis Date  . Asthma, chronic 06/02/2013  . Carpal tunnel syndrome 04/17/2015   Bilateral  . Depression 06/02/2013  . DYSPNEA ON EXERTION 06/04/2009   Qualifier: Diagnosis of  By: Domenic Polite, MD, Phillips Hay   . GERD (gastroesophageal reflux disease)   . Hereditary and idiopathic peripheral neuropathy 04/17/2015  . Hyperlipemia 06/02/2013  . LEG PAIN, BILATERAL 06/04/2009   Qualifier: Diagnosis of  By: Domenic Polite, MD, Phillips Hay   . PERCUTANEOUS TRANSLUMINAL CORONARY ANGIOPLASTY, HX OF 06/04/2009   Qualifier: Diagnosis of  By: Domenic Polite, MD, Phillips Hay   . Prostate CA [facility])     Past Surgical History:  Procedure Laterality Date  . CARDIAC CATHETERIZATION  1997  . HIATAL HERNIA REPAIR    . LAPAROSCOPIC GASTRIC BANDING  2000   [facility]   . ROBOT ASSISTED LAPAROSCOPIC RADICAL PROSTATECTOMY      There were no vitals filed for this visit.  Subjective Assessment - 02/23/18 0958    Subjective  Patient reports shoulder is alright. Reports pain with certain movements such as turning the steering wheel.    Pertinent History  CTS; angioplasty.    Patient Stated Goals  Improve mobility with  decreased pain.    Currently in Pain?  Yes    Pain Score  1     Pain Orientation  Right    Pain Descriptors / Indicators  Aching;Dull    Pain Type  Surgical pain    Pain Onset  1 to 4 weeks ago         [facility] PT Assessment - 02/23/18 0001      Assessment   Medical Diagnosis  Acute right shoulder pain.                   [facility] Adult PT Treatment/Exercise - 02/23/18 0001      Exercises   Exercises  Shoulder      Shoulder Exercises: Supine   External Rotation  AAROM;Both;20 reps with PVC    Flexion  AAROM;Both;20 reps with PVC      Shoulder Exercises: Pulleys   Flexion  5 minutes      Shoulder Exercises: ROM/Strengthening   UBE (Upper Arm Bike)  120 RPM, 8 minutes (4 fwd, 4 bwd    Ranger  flexion, clockwise, counter clockwise x20 each      Manual Therapy   Manual Therapy  Soft tissue mobilization    Soft tissue mobilization  STW to biceps and anterior shoulder to decrease pain  PT Short Term Goals - 02/17/18 1518      PT SHORT TERM GOAL #1   Title  STG's=LTG's.        PT Long Term Goals - 02/17/18 1519      PT LONG TERM GOAL #1   Title  Ind with a HEP.    Time  8    Period  Weeks    Status  New      PT LONG TERM GOAL #2   Title  Perform ADL's with pain not > 2-3/10.    Time  8    Period  Weeks    Status  New      PT LONG TERM GOAL #3   Title  Active right shoulder ER to 80 degrees+ to allow for easily donning/doffing of apparel.    Time  8    Period  Weeks    Status  New            Plan - 02/23/18 1046    Clinical Impression Statement  Patient was able to tolerate treatment well with minimal reports of pain with exercises. Patient reported most difficulty is the eccentric control from about 80 degrees of flexion to neutral. Patient reported decrease in pain with STW/M to anterior shoulder and biceps.    Clinical Presentation  Stable    Rehab Potential  Good    PT Frequency  2x / week    PT Duration  8 weeks     PT Treatment/Interventions  ADLs/Self Care Home Management;Cryotherapy;Electrical Stimulation;Ultrasound;Moist Heat;Therapeutic activities;Therapeutic exercise;Patient/family education;Neuromuscular re-education;Manual techniques    PT Next Visit Plan  Continue with AAROM; pulleys; UBE and UE ranger.  Passive range of motion to increase right shoulder ER.  Begin with isometrics and then to yellow theraband.  STW/M to biceps and anterior shoulder E'stim and electrical stimulation.    Consulted and Agree with Plan of Care  Patient       Patient will benefit from skilled therapeutic intervention in order to improve the following deficits and impairments:  Decreased activity tolerance, Decreased range of motion, Pain, Decreased strength  Visit Diagnosis: Acute pain of right shoulder  Stiffness of right shoulder, not elsewhere classified     Problem List Patient Active Problem List   Diagnosis Date Noted  . H/O arthroscopy of shoulder 02/10/2018  . Unilateral primary osteoarthritis, left knee 11/25/2016  . Unilateral primary osteoarthritis, right knee 11/25/2016  . Essential hypertension 06/28/2015  . H/O prostate cancer 06/28/2015  . Hereditary and idiopathic peripheral neuropathy 04/17/2015  . Carpal tunnel syndrome 04/17/2015  . Hyperlipemia 06/02/2013  . Asthma, chronic 06/02/2013  . Depression 06/02/2013  . GERD 06/04/2009  . LEG PAIN, BILATERAL 06/04/2009  . DYSPNEA ON EXERTION 06/04/2009  . PERCUTANEOUS TRANSLUMINAL CORONARY ANGIOPLASTY, HX OF 06/04/2009    Gabriela Eves, PT, DPT 02/23/2018, 11:26 AM  [facility] [address] [place], [place], [address] Phone: [phone]   Fax:  [phone]  Name: [name] [name] MRN: [id number] Date of Birth: Jan 26, 1946

## 2018-02-25 ENCOUNTER — Ambulatory Visit: Payer: Medicare Other | Admitting: Physical Therapy

## 2018-02-25 ENCOUNTER — Encounter: Payer: Self-pay | Admitting: Physical Therapy

## 2018-02-25 DIAGNOSIS — M25611 Stiffness of right shoulder, not elsewhere classified: Secondary | ICD-10-CM

## 2018-02-25 DIAGNOSIS — M25511 Pain in right shoulder: Secondary | ICD-10-CM | POA: Diagnosis not present

## 2018-02-25 NOTE — Therapy (Signed)
[facility] [place], [place], [address] Phone: [phone]   Fax:  [phone]  Physical Therapy Treatment  Patient Details  Name: [name] [name] MRN: [id number] Date of Birth: 11/18/1945 Referring Provider: Erskine Emery PA-C   Encounter Date: 02/25/2018  PT End of Session - 02/25/18 0953    Visit Number  3    Number of Visits  16    Date for PT Re-Evaluation  05/18/18    Authorization Type  FOTO AT LEAST EVERY 5TH VISIT, 10TH VISIT PROGRESS NOTE AND KX MODIFIER AFTER THE 15 VISIT.    PT Start Time  0945    PT Stop Time  1040    PT Time Calculation (min)  55 min    Activity Tolerance  Patient tolerated treatment well    Behavior During Therapy  WFL for tasks assessed/performed       Past Medical History:  Diagnosis Date  . Asthma, chronic 06/02/2013  . Carpal tunnel syndrome 04/17/2015   Bilateral  . Depression 06/02/2013  . DYSPNEA ON EXERTION 06/04/2009   Qualifier: Diagnosis of  By: Domenic Polite, MD, Phillips Hay   . GERD (gastroesophageal reflux disease)   . Hereditary and idiopathic peripheral neuropathy 04/17/2015  . Hyperlipemia 06/02/2013  . LEG PAIN, BILATERAL 06/04/2009   Qualifier: Diagnosis of  By: Domenic Polite, MD, Phillips Hay   . PERCUTANEOUS TRANSLUMINAL CORONARY ANGIOPLASTY, HX OF 06/04/2009   Qualifier: Diagnosis of  By: Domenic Polite, MD, Phillips Hay   . Prostate CA [facility])     Past Surgical History:  Procedure Laterality Date  . CARDIAC CATHETERIZATION  1997  . HIATAL HERNIA REPAIR    . LAPAROSCOPIC GASTRIC BANDING  2000   [facility]   . ROBOT ASSISTED LAPAROSCOPIC RADICAL PROSTATECTOMY      There were no vitals filed for this visit.  Subjective Assessment - 02/25/18 0948    Subjective  Pt arriving reporting R bicep tendon pain at night. Pt reporting sharp pain at night and difficutly sleeping reporting 10/10 at night. Pt currently reporting 4/10 pain in R shoulder.     Pertinent  History  CTS; angioplasty.    Patient Stated Goals  Improve mobility with decreased pain.    Currently in Pain?  Yes    Pain Score  4     Pain Location  Shoulder    Pain Orientation  Right    Pain Descriptors / Indicators  Aching;Dull    Pain Type  Surgical pain    Pain Onset  1 to 4 weeks ago    Pain Frequency  Constant    Aggravating Factors   sleeping on my side    Pain Relieving Factors  resting, sitting                       [facility] Adult PT Treatment/Exercise - 02/25/18 0001      Shoulder Exercises: Supine   External Rotation  AAROM;Both;20 reps with PVC    Flexion  AAROM;Both;20 reps with PVC      Shoulder Exercises: Pulleys   Flexion  5 minutes      Shoulder Exercises: ROM/Strengthening   UBE (Upper Arm Bike)  120 RPM, 8 minutes (4 fwd, 4 bwd    Ranger  flexion x 20x, clockwise,   counter clockwise x 10 each circles were more difficult      Modalities   Modalities  Health visitor  Stimulation Location  r shoulder    Electrical Stimulation Action  IFC    Electrical Stimulation Parameters  80-150 hz x 15 minutes, intensity to tolerance      Vasopneumatic   Number Minutes Vasopneumatic   15 minutes    Vasopnuematic Location   Shoulder    Vasopneumatic Pressure  Medium    Vasopneumatic Temperature   36      Manual Therapy   Manual Therapy  Soft tissue mobilization    Soft tissue mobilization  STW to biceps and anterior shoulder to decrease pain             PT Education - 02/25/18 0952    Education Details  Edu pt on using pillows to help position shoulder in neutral position during sleeping.     Person(s) Educated  Patient    Methods  Explanation;Demonstration    Comprehension  Verbalized understanding       PT Short Term Goals - 02/25/18 0956      PT SHORT TERM GOAL #1   Title  STG's=LTG's.        PT Long Term Goals - 02/25/18 0957      PT LONG TERM GOAL #1   Title   Ind with a HEP.    Time  8    Period  Weeks    Status  New      PT LONG TERM GOAL #2   Title  Perform ADL's with pain not > 2-3/10.    Time  8    Period  Weeks    Status  On-going      PT LONG TERM GOAL #3   Title  Active right shoulder ER to 80 degrees+ to allow for easily donning/doffing of apparel.    Time  8    Period  Weeks    Status  New            Plan - 02/25/18 0954    Clinical Impression Statement  Pt tolerating all treatment exercises well with no reports of increaesd pain during session. Pt with tenderness noted over R biceps tendon. IASTW performed. Pt reporting pain decreased at end of session folowing vasopneumatic and E-stim. Continue with skilled PT to progress toward goals set. No new goals met this session.     Clinical Presentation  Stable    Clinical Decision Making  Low    Rehab Potential  Good    PT Frequency  2x / week    PT Duration  8 weeks    PT Treatment/Interventions  ADLs/Self Care Home Management;Cryotherapy;Electrical Stimulation;Ultrasound;Moist Heat;Therapeutic activities;Therapeutic exercise;Patient/family education;Neuromuscular re-education;Manual techniques    PT Next Visit Plan  Continue with AAROM; pulleys; UBE and UE ranger.  Passive range of motion to increase right shoulder ER.  Begin with isometrics and then to yellow theraband.  STW/M to biceps and anterior shoulder E'stim and electrical stimulation.    Consulted and Agree with Plan of Care  Patient       Patient will benefit from skilled therapeutic intervention in order to improve the following deficits and impairments:  Decreased activity tolerance, Decreased range of motion, Pain, Decreased strength  Visit Diagnosis: Acute pain of right shoulder  Stiffness of right shoulder, not elsewhere classified     Problem List Patient Active Problem List   Diagnosis Date Noted  . H/O arthroscopy of shoulder 02/10/2018  . Unilateral primary osteoarthritis, left knee 11/25/2016   . Unilateral primary osteoarthritis, right knee 11/25/2016  . Essential hypertension 06/28/2015  .  H/O prostate cancer 06/28/2015  . Hereditary and idiopathic peripheral neuropathy 04/17/2015  . Carpal tunnel syndrome 04/17/2015  . Hyperlipemia 06/02/2013  . Asthma, chronic 06/02/2013  . Depression 06/02/2013  . GERD 06/04/2009  . LEG PAIN, BILATERAL 06/04/2009  . DYSPNEA ON EXERTION 06/04/2009  . PERCUTANEOUS TRANSLUMINAL CORONARY ANGIOPLASTY, HX OF 06/04/2009    Oretha Caprice, MPT 02/25/2018, 11:24 AM  [facility] [address] [place], [place], [address] Phone: [phone]   Fax:  [phone]  Name: [name] [name] MRN: [id number] Date of Birth: [birth year]

## 2018-02-28 ENCOUNTER — Ambulatory Visit: Payer: Medicare Other | Admitting: Physical Therapy

## 2018-02-28 DIAGNOSIS — M25611 Stiffness of right shoulder, not elsewhere classified: Secondary | ICD-10-CM

## 2018-02-28 DIAGNOSIS — M25511 Pain in right shoulder: Secondary | ICD-10-CM | POA: Diagnosis not present

## 2018-02-28 NOTE — Therapy (Signed)
[facility] [place], [place], [address] Phone: [phone]   Fax:  [phone]  Physical Therapy Treatment  Patient Details  Name: [name] [name] MRN: [id number] Date of Birth: 1946/07/04 Referring Provider: Erskine Emery PA-C   Encounter Date: 02/28/2018  PT End of Session - 02/28/18 0946    Visit Number  4    Number of Visits  16    Date for PT Re-Evaluation  05/18/18    Authorization Type  FOTO AT LEAST EVERY 5TH VISIT, 10TH VISIT PROGRESS NOTE AND KX MODIFIER AFTER THE 15 VISIT.    PT Start Time  0945    PT Stop Time  1042    PT Time Calculation (min)  57 min    Activity Tolerance  Patient tolerated treatment well    Behavior During Therapy  WFL for tasks assessed/performed       Past Medical History:  Diagnosis Date  . Asthma, chronic 06/02/2013  . Carpal tunnel syndrome 04/17/2015   Bilateral  . Depression 06/02/2013  . DYSPNEA ON EXERTION 06/04/2009   Qualifier: Diagnosis of  By: Domenic Polite, MD, Phillips Hay   . GERD (gastroesophageal reflux disease)   . Hereditary and idiopathic peripheral neuropathy 04/17/2015  . Hyperlipemia 06/02/2013  . LEG PAIN, BILATERAL 06/04/2009   Qualifier: Diagnosis of  By: Domenic Polite, MD, Phillips Hay   . PERCUTANEOUS TRANSLUMINAL CORONARY ANGIOPLASTY, HX OF 06/04/2009   Qualifier: Diagnosis of  By: Domenic Polite, MD, Phillips Hay   . Prostate CA [facility])     Past Surgical History:  Procedure Laterality Date  . CARDIAC CATHETERIZATION  1997  . HIATAL HERNIA REPAIR    . LAPAROSCOPIC GASTRIC BANDING  2000   [facility]   . ROBOT ASSISTED LAPAROSCOPIC RADICAL PROSTATECTOMY      There were no vitals filed for this visit.  Subjective Assessment - 02/28/18 0948    Subjective  Patient reported feeling much better with improved motion and decreased pain    Pertinent History  CTS; angioplasty.    Patient Stated Goals  Improve mobility with decreased pain.    Currently in  Pain?  No/denies         [facility] PT Assessment - 02/28/18 0001      Assessment   Medical Diagnosis  Acute right shoulder pain.                   [facility] Adult PT Treatment/Exercise - 02/28/18 0001      Exercises   Exercises  Shoulder      Shoulder Exercises: Supine   External Rotation  AAROM;Both;20 reps PVC    Flexion  AAROM;Both;20 reps PVC      Shoulder Exercises: Pulleys   Flexion  5 minutes      Shoulder Exercises: ROM/Strengthening   UBE (Upper Arm Bike)  120 RPM, 8 minutes (4 fwd, 4 bwd    Ranger  flexion x 20x, clockwise,  counter clockwise x 20 each      Shoulder Exercises: Isometric Strengthening   Flexion  5X10"    External Rotation  5X10"    ADduction  5X10"      Modalities   Modalities  Electrical Stimulation;Vasopneumatic      Electrical Stimulation   Electrical Stimulation Location  r shoulder    Electrical Stimulation Action  IFC    Electrical Stimulation Parameters  80-150 hz x10 min    Electrical Stimulation Goals  Pain      Vasopneumatic   Number  Minutes Vasopneumatic   10 minutes    Vasopnuematic Location   Shoulder    Vasopneumatic Pressure  Low      Manual Therapy   Manual Therapy  Soft tissue mobilization    Soft tissue mobilization  STW to biceps and anterior shoulder to decrease pain               PT Short Term Goals - 02/25/18 0956      PT SHORT TERM GOAL #1   Title  STG's=LTG's.        PT Long Term Goals - 02/25/18 0957      PT LONG TERM GOAL #1   Title  Ind with a HEP.    Time  8    Period  Weeks    Status  New      PT LONG TERM GOAL #2   Title  Perform ADL's with pain not > 2-3/10.    Time  8    Period  Weeks    Status  On-going      PT LONG TERM GOAL #3   Title  Active right shoulder ER to 80 degrees+ to allow for easily donning/doffing of apparel.    Time  8    Period  Weeks    Status  New            Plan - 02/28/18 1012    Clinical Impression Statement  Patient was able to toleate  treatment well despite reports of "tolerable" pain in anterior shoulder near biceps tendon during UE ranger exercise. Patient overall reported decrease of pain after  STW/M to anterior shoulder and to trigger point in biceps. Normal response to modalities upon end of session.    Clinical Presentation  Stable    Clinical Decision Making  Low    Rehab Potential  Good    PT Frequency  2x / week    PT Duration  8 weeks    PT Treatment/Interventions  ADLs/Self Care Home Management;Cryotherapy;Electrical Stimulation;Ultrasound;Moist Heat;Therapeutic activities;Therapeutic exercise;Patient/family education;Neuromuscular re-education;Manual techniques    PT Next Visit Plan  Continue with AAROM; pulleys; UBE and UE ranger.  Passive range of motion to increase right shoulder ER.  Begin with isometrics and then to yellow theraband.  STW/M to biceps and anterior shoulder E'stim and electrical stimulation.    Consulted and Agree with Plan of Care  Patient       Patient will benefit from skilled therapeutic intervention in order to improve the following deficits and impairments:  Decreased activity tolerance, Decreased range of motion, Pain, Decreased strength  Visit Diagnosis: Acute pain of right shoulder  Stiffness of right shoulder, not elsewhere classified     Problem List Patient Active Problem List   Diagnosis Date Noted  . H/O arthroscopy of shoulder 02/10/2018  . Unilateral primary osteoarthritis, left knee 11/25/2016  . Unilateral primary osteoarthritis, right knee 11/25/2016  . Essential hypertension 06/28/2015  . H/O prostate cancer 06/28/2015  . Hereditary and idiopathic peripheral neuropathy 04/17/2015  . Carpal tunnel syndrome 04/17/2015  . Hyperlipemia 06/02/2013  . Asthma, chronic 06/02/2013  . Depression 06/02/2013  . GERD 06/04/2009  . LEG PAIN, BILATERAL 06/04/2009  . DYSPNEA ON EXERTION 06/04/2009  . PERCUTANEOUS TRANSLUMINAL CORONARY ANGIOPLASTY, HX OF 06/04/2009    Gabriela Eves, PT, DPT 02/28/2018, 11:10 AM  [facility] [address] [place], [place], [address] Phone: [phone]   Fax:  [phone]  Name: [name] [name] MRN: [id number] Date of Birth: 01-Mar-1946

## 2018-03-03 ENCOUNTER — Ambulatory Visit: Payer: Medicare Other | Admitting: Physical Therapy

## 2018-03-03 DIAGNOSIS — M25511 Pain in right shoulder: Secondary | ICD-10-CM | POA: Diagnosis not present

## 2018-03-03 DIAGNOSIS — M25611 Stiffness of right shoulder, not elsewhere classified: Secondary | ICD-10-CM | POA: Diagnosis not present

## 2018-03-03 NOTE — Therapy (Signed)
[facility] [place], [place], [address] Phone: [phone]   Fax:  [phone]  Physical Therapy Treatment  Patient Details  Name: [name] [name] MRN: [id number] Date of Birth: October 19, 1945 Referring Provider: Erskine Emery PA-C   Encounter Date: 03/03/2018  PT End of Session - 03/03/18 0949    Visit Number  5    Number of Visits  16    Date for PT Re-Evaluation  05/18/18    Authorization Type  FOTO AT LEAST EVERY 5TH VISIT, 10TH VISIT PROGRESS NOTE AND KX MODIFIER AFTER THE 15 VISIT.    PT Start Time  [phone]    PT Stop Time  1041    PT Time Calculation (min)  55 min    Activity Tolerance  Patient tolerated treatment well    Behavior During Therapy  WFL for tasks assessed/performed       Past Medical History:  Diagnosis Date  . Asthma, chronic 06/02/2013  . Carpal tunnel syndrome 04/17/2015   Bilateral  . Depression 06/02/2013  . DYSPNEA ON EXERTION 06/04/2009   Qualifier: Diagnosis of  By: Domenic Polite, MD, Phillips Hay   . GERD (gastroesophageal reflux disease)   . Hereditary and idiopathic peripheral neuropathy 04/17/2015  . Hyperlipemia 06/02/2013  . LEG PAIN, BILATERAL 06/04/2009   Qualifier: Diagnosis of  By: Domenic Polite, MD, Phillips Hay   . PERCUTANEOUS TRANSLUMINAL CORONARY ANGIOPLASTY, HX OF 06/04/2009   Qualifier: Diagnosis of  By: Domenic Polite, MD, Phillips Hay   . Prostate CA [facility])     Past Surgical History:  Procedure Laterality Date  . CARDIAC CATHETERIZATION  1997  . HIATAL HERNIA REPAIR    . LAPAROSCOPIC GASTRIC BANDING  2000   [facility]   . ROBOT ASSISTED LAPAROSCOPIC RADICAL PROSTATECTOMY      There were no vitals filed for this visit.  Subjective Assessment - 03/03/18 1031    Subjective  Patient reported feeling more sore today as yesterday was a bad day.    Pertinent History  CTS; angioplasty.    Patient Stated Goals  Improve mobility with decreased pain.    Currently in Pain?  Yes     Pain Score  2     Pain Orientation  Right    Pain Descriptors / Indicators  Aching;Dull    Pain Type  Surgical pain    Pain Onset  1 to 4 weeks ago         [facility] PT Assessment - 03/03/18 0001      Assessment   Medical Diagnosis  Acute right shoulder pain.                   [facility] Adult PT Treatment/Exercise - 03/03/18 0001      Shoulder Exercises: Pulleys   Flexion  5 minutes      Shoulder Exercises: ROM/Strengthening   UBE (Upper Arm Bike)  90 RPM, 8 minutes (4 fwd, 4 bwd    [place]  flexion, clockwise, counter clockwise x20 each      Shoulder Exercises: Isometric Strengthening   Flexion  Other (comment) 5" hold x10    External Rotation  Other (comment) 5" hold x10    ADduction  Other (comment) 5" hold x10      Shoulder Exercises: Stretch   Corner Stretch  3 reps;30 seconds    Cross Chest Stretch  3 reps;30 seconds      Modalities   Modalities  Electrical Stimulation;Vasopneumatic      Electrical Stimulation  Electrical Stimulation Location  r shoulder    Electrical Stimulation Action  IFC    Electrical Stimulation Parameters  80-150 hz x10 pain    Electrical Stimulation Goals  Pain      Manual Therapy   Manual Therapy  Soft tissue mobilization    Soft tissue mobilization  STW/IASTM to biceps and anterior shoulder to decrease pain               PT Short Term Goals - 02/25/18 0956      PT SHORT TERM GOAL #1   Title  STG's=LTG's.        PT Long Term Goals - 02/25/18 0957      PT LONG TERM GOAL #1   Title  Ind with a HEP.    Time  8    Period  Weeks    Status  New      PT LONG TERM GOAL #2   Title  Perform ADL's with pain not > 2-3/10.    Time  8    Period  Weeks    Status  On-going      PT LONG TERM GOAL #3   Title  Active right shoulder ER to 80 degrees+ to allow for easily donning/doffing of apparel.    Time  8    Period  Weeks    Status  New              Patient will benefit from skilled therapeutic  intervention in order to improve the following deficits and impairments:     Visit Diagnosis: Acute pain of right shoulder  Stiffness of right shoulder, not elsewhere classified     Problem List Patient Active Problem List   Diagnosis Date Noted  . H/O arthroscopy of shoulder 02/10/2018  . Unilateral primary osteoarthritis, left knee 11/25/2016  . Unilateral primary osteoarthritis, right knee 11/25/2016  . Essential hypertension 06/28/2015  . H/O prostate cancer 06/28/2015  . Hereditary and idiopathic peripheral neuropathy 04/17/2015  . Carpal tunnel syndrome 04/17/2015  . Hyperlipemia 06/02/2013  . Asthma, chronic 06/02/2013  . Depression 06/02/2013  . GERD 06/04/2009  . LEG PAIN, BILATERAL 06/04/2009  . DYSPNEA ON EXERTION 06/04/2009  . PERCUTANEOUS TRANSLUMINAL CORONARY ANGIOPLASTY, HX OF 06/04/2009   Gabriela Eves, PT, DPT 03/03/2018, 12:26 PM  [facility] [address] [place], [place], [address] Phone: [phone]   Fax:  [phone]  Name: [name] [name] MRN: [id number] Date of Birth: March 16, 1946

## 2018-03-07 ENCOUNTER — Ambulatory Visit (INDEPENDENT_AMBULATORY_CARE_PROVIDER_SITE_OTHER): Payer: Medicare Other | Admitting: Physician Assistant

## 2018-03-07 ENCOUNTER — Encounter (INDEPENDENT_AMBULATORY_CARE_PROVIDER_SITE_OTHER): Payer: Self-pay | Admitting: Physician Assistant

## 2018-03-07 VITALS — Ht 74.0 in | Wt 227.0 lb

## 2018-03-07 DIAGNOSIS — Z9889 Other specified postprocedural states: Secondary | ICD-10-CM

## 2018-03-07 DIAGNOSIS — M75121 Complete rotator cuff tear or rupture of right shoulder, not specified as traumatic: Secondary | ICD-10-CM

## 2018-03-07 MED ORDER — METHOCARBAMOL 500 MG PO TABS: 500.0000 mg | ORAL_TABLET | Freq: Three times a day (TID) | ORAL | 1 refills | Status: DC

## 2018-03-07 NOTE — Progress Notes (Signed)
HPI: Mr. Lucus returns today follow-up of his right shoulder arthroscopy with attempted rotator cuff repair 02/03/2018.  He is going to physical therapy 2 times a week.  He is having some cramping muscle pain particularly at night.  He has pain in the shoulder with certain movements particularly external rotation and with the driving.  Physical exam: Right shoulder he has weakness with external rotation against resistance.  5 out of 5 strength bilaterally with internal rotation against resistance.  Full forward flexion actively.  Impingement testing negative.  Port sites are all well-healed no signs of infection.  Impression: Status post right shoulder arthroscopy 02/03/2018  Plan: We will call in some Robaxin for him to take primarily at night to see if this helps with muscle cramping.  He will continue work with physical therapy range of motion strengthening shoulder.  He follow with [place] on an as-needed basis pain persist or becomes worse.  Questions encouraged and answered.

## 2018-03-08 ENCOUNTER — Encounter: Payer: Self-pay | Admitting: Physical Therapy

## 2018-03-08 ENCOUNTER — Ambulatory Visit: Payer: Medicare Other | Admitting: Physical Therapy

## 2018-03-08 DIAGNOSIS — M25611 Stiffness of right shoulder, not elsewhere classified: Secondary | ICD-10-CM | POA: Diagnosis not present

## 2018-03-08 DIAGNOSIS — M25511 Pain in right shoulder: Secondary | ICD-10-CM

## 2018-03-08 NOTE — Therapy (Signed)
[facility] [place], [place], [address] Phone: [phone]   Fax:  [phone]  Physical Therapy Treatment  Patient Details  Name: [name] [name] MRN: [id number] Date of Birth: [birth year] Referring Provider: Erskine Emery PA-C   Encounter Date: 03/08/2018  PT End of Session - 03/08/18 1111    Visit Number  6    Number of Visits  16    Date for PT Re-Evaluation  05/18/18    Authorization Type  FOTO AT LEAST EVERY 5TH VISIT, 10TH VISIT PROGRESS NOTE AND KX MODIFIER AFTER THE 15 VISIT.    PT Start Time  [phone]    PT Stop Time  1043    PT Time Calculation (min)  56 min    Activity Tolerance  Patient tolerated treatment well    Behavior During Therapy  WFL for tasks assessed/performed       Past Medical History:  Diagnosis Date  . Asthma, chronic 06/02/2013  . Carpal tunnel syndrome 04/17/2015   Bilateral  . Depression 06/02/2013  . DYSPNEA ON EXERTION 06/04/2009   Qualifier: Diagnosis of  By: Domenic Polite, MD, Phillips Hay   . GERD (gastroesophageal reflux disease)   . Hereditary and idiopathic peripheral neuropathy 04/17/2015  . Hyperlipemia 06/02/2013  . LEG PAIN, BILATERAL 06/04/2009   Qualifier: Diagnosis of  By: Domenic Polite, MD, Phillips Hay   . PERCUTANEOUS TRANSLUMINAL CORONARY ANGIOPLASTY, HX OF 06/04/2009   Qualifier: Diagnosis of  By: Domenic Polite, MD, Phillips Hay   . Prostate CA [facility])     Past Surgical History:  Procedure Laterality Date  . CARDIAC CATHETERIZATION  1997  . HIATAL HERNIA REPAIR    . LAPAROSCOPIC GASTRIC BANDING  2000   [facility]   . ROBOT ASSISTED LAPAROSCOPIC RADICAL PROSTATECTOMY      There were no vitals filed for this visit.  Subjective Assessment - 03/08/18 1024    Subjective  Dr. was pleased.  I'm doing good but I still get a very sharp pain in my shoulder with certain movements.    Patient Stated Goals  Improve mobility with decreased pain.    Currently in Pain?  Yes    Pain Score  2     Pain Location  Shoulder    Pain Orientation  Right    Pain Descriptors / Indicators  Aching;Dull                       [facility] Adult PT Treatment/Exercise - 03/08/18 0001      Exercises   Exercises  Shoulder      Shoulder Exercises: Standing   Other Standing Exercises  UE Ranger on wall x 5 minutes f/b wall ladder x 3 minutes.      Shoulder Exercises: Pulleys   Flexion  5 minutes      Modalities   Modalities  Electrical Stimulation;Ultrasound      Electrical Stimulation   Electrical Stimulation Location  Right shoulder.    Electrical Stimulation Action  IFC    Electrical Stimulation Parameters  80-150 Hz x 15 minutes.    Electrical Stimulation Goals  Pain      Ultrasound   Ultrasound Location  -- Right anterior shoulder.    Ultrasound Parameters  Combo e'stim/U/S at 1.50 W/CM2 x 11 minutes.    Ultrasound Goals  Pain      Vasopneumatic   Number Minutes Vasopneumatic   15 minutes    Vasopnuematic Location   -- Right shoulder.  Vasopneumatic Pressure  Medium               PT Short Term Goals - 02/25/18 0956      PT SHORT TERM GOAL #1   Title  STG's=LTG's.        PT Long Term Goals - 02/25/18 0957      PT LONG TERM GOAL #1   Title  Ind with a HEP.    Time  8    Period  Weeks    Status  New      PT LONG TERM GOAL #2   Title  Perform ADL's with pain not > 2-3/10.    Time  8    Period  Weeks    Status  On-going      PT LONG TERM GOAL #3   Title  Active right shoulder ER to 80 degrees+ to allow for easily donning/doffing of apparel.    Time  8    Period  Weeks    Status  New            Plan - 03/08/18 1059    Clinical Impression Statement  The patient did great with combo e'stim/U/S to his right bicipital groove region today.    PT Treatment/Interventions  ADLs/Self Care Home Management;Cryotherapy;Electrical Stimulation;Ultrasound;Moist Heat;Therapeutic activities;Therapeutic exercise;Patient/family  education;Neuromuscular re-education;Manual techniques    PT Next Visit Plan  Continue with AAROM; pulleys; UBE and UE ranger.  Passive range of motion to increase right shoulder ER.  Begin with isometrics and then to yellow theraband.  STW/M to biceps and anterior shoulder E'stim and electrical stimulation.    Consulted and Agree with Plan of Care  Patient       Patient will benefit from skilled therapeutic intervention in order to improve the following deficits and impairments:  Decreased activity tolerance, Decreased range of motion, Pain, Decreased strength  Visit Diagnosis: Acute pain of right shoulder  Stiffness of right shoulder, not elsewhere classified     Problem List Patient Active Problem List   Diagnosis Date Noted  . H/O arthroscopy of shoulder 02/10/2018  . Unilateral primary osteoarthritis, left knee 11/25/2016  . Unilateral primary osteoarthritis, right knee 11/25/2016  . Essential hypertension 06/28/2015  . H/O prostate cancer 06/28/2015  . Hereditary and idiopathic peripheral neuropathy 04/17/2015  . Carpal tunnel syndrome 04/17/2015  . Hyperlipemia 06/02/2013  . Asthma, chronic 06/02/2013  . Depression 06/02/2013  . GERD 06/04/2009  . LEG PAIN, BILATERAL 06/04/2009  . DYSPNEA ON EXERTION 06/04/2009  . PERCUTANEOUS TRANSLUMINAL CORONARY ANGIOPLASTY, HX OF 06/04/2009    Swetha Rayle, [place] MPT 03/08/2018, 11:12 AM  [facility] [place], [place], [address] Phone: [phone]   Fax:  [phone]  Name: [name] [name] MRN: [id number] Date of Birth: [birth year]

## 2018-03-10 ENCOUNTER — Encounter: Payer: Self-pay | Admitting: Physical Therapy

## 2018-03-10 ENCOUNTER — Ambulatory Visit: Payer: Medicare Other | Admitting: Physical Therapy

## 2018-03-10 DIAGNOSIS — M25511 Pain in right shoulder: Secondary | ICD-10-CM

## 2018-03-10 DIAGNOSIS — M25611 Stiffness of right shoulder, not elsewhere classified: Secondary | ICD-10-CM | POA: Diagnosis not present

## 2018-03-10 NOTE — Therapy (Signed)
[facility] [place], [place], [address] Phone: [phone]   Fax:  [phone]  Physical Therapy Treatment  Patient Details  Name: [name] [name] MRN: [id number] Date of Birth: Apr 09, 1946 Referring Provider: Erskine Emery PA-C   Encounter Date: 03/10/2018  PT End of Session - 03/10/18 0956    Visit Number  7    Number of Visits  16    Date for PT Re-Evaluation  05/18/18    Authorization Type  FOTO AT LEAST EVERY 5TH VISIT, 10TH VISIT PROGRESS NOTE AND KX MODIFIER AFTER THE 15 VISIT.    PT Start Time  0945    PT Stop Time  1048    PT Time Calculation (min)  63 min    Activity Tolerance  Patient tolerated treatment well    Behavior During Therapy  WFL for tasks assessed/performed       Past Medical History:  Diagnosis Date  . Asthma, chronic 06/02/2013  . Carpal tunnel syndrome 04/17/2015   Bilateral  . Depression 06/02/2013  . DYSPNEA ON EXERTION 06/04/2009   Qualifier: Diagnosis of  By: Domenic Polite, MD, Phillips Hay   . GERD (gastroesophageal reflux disease)   . Hereditary and idiopathic peripheral neuropathy 04/17/2015  . Hyperlipemia 06/02/2013  . LEG PAIN, BILATERAL 06/04/2009   Qualifier: Diagnosis of  By: Domenic Polite, MD, Phillips Hay   . PERCUTANEOUS TRANSLUMINAL CORONARY ANGIOPLASTY, HX OF 06/04/2009   Qualifier: Diagnosis of  By: Domenic Polite, MD, Phillips Hay   . Prostate CA [facility])     Past Surgical History:  Procedure Laterality Date  . CARDIAC CATHETERIZATION  1997  . HIATAL HERNIA REPAIR    . LAPAROSCOPIC GASTRIC BANDING  2000   [facility]   . ROBOT ASSISTED LAPAROSCOPIC RADICAL PROSTATECTOMY      There were no vitals filed for this visit.  Subjective Assessment - 03/10/18 0956    Subjective  Patient reported feeling good. Patient reported last treatment helped.    Pertinent History  CTS; angioplasty.    Patient Stated Goals  Improve mobility with decreased pain.    Currently in Pain?   Yes    Pain Score  2     Pain Orientation  Right    Pain Descriptors / Indicators  Aching    Pain Type  Surgical pain    Pain Onset  1 to 4 weeks ago         [facility] PT Assessment - 03/10/18 0001      Assessment   Medical Diagnosis  Acute right shoulder pain.                   [facility] Adult PT Treatment/Exercise - 03/10/18 0001      Exercises   Exercises  Shoulder      Shoulder Exercises: Standing   Other Standing Exercises  UE Ranger on wall x 5 minutes    Other Standing Exercises  Wall Ladder 5 minutes      Shoulder Exercises: ROM/Strengthening   UBE (Upper Arm Bike)  90 RPM, 8 minutes (4 fwd, 4 bwd      Modalities   Modalities  Electrical Stimulation;Ultrasound      Electrical Stimulation   Electrical Stimulation Location  Right shoulder.    Electrical Stimulation Action  IFC    Electrical Stimulation Parameters  80-150 hz x15 min    Electrical Stimulation Goals  Pain      Ultrasound   Ultrasound Location  right anterior shoulder  Ultrasound Parameters  Combo e-stim/US, 100%, 1 mHz, 1.5 w/cm2    Ultrasound Goals  Pain      Vasopneumatic   Number Minutes Vasopneumatic   15 minutes    Vasopnuematic Location   Shoulder    Vasopneumatic Pressure  Low    Vasopneumatic Temperature   35               PT Short Term Goals - 02/25/18 0956      PT SHORT TERM GOAL #1   Title  STG's=LTG's.        PT Long Term Goals - 02/25/18 0957      PT LONG TERM GOAL #1   Title  Ind with a HEP.    Time  8    Period  Weeks    Status  New      PT LONG TERM GOAL #2   Title  Perform ADL's with pain not > 2-3/10.    Time  8    Period  Weeks    Status  On-going      PT LONG TERM GOAL #3   Title  Active right shoulder ER to 80 degrees+ to allow for easily donning/doffing of apparel.    Time  8    Period  Weeks    Status  New              Patient will benefit from skilled therapeutic intervention in order to improve the following deficits and  impairments:     Visit Diagnosis: Acute pain of right shoulder  Stiffness of right shoulder, not elsewhere classified     Problem List Patient Active Problem List   Diagnosis Date Noted  . H/O arthroscopy of shoulder 02/10/2018  . Unilateral primary osteoarthritis, left knee 11/25/2016  . Unilateral primary osteoarthritis, right knee 11/25/2016  . Essential hypertension 06/28/2015  . H/O prostate cancer 06/28/2015  . Hereditary and idiopathic peripheral neuropathy 04/17/2015  . Carpal tunnel syndrome 04/17/2015  . Hyperlipemia 06/02/2013  . Asthma, chronic 06/02/2013  . Depression 06/02/2013  . GERD 06/04/2009  . LEG PAIN, BILATERAL 06/04/2009  . DYSPNEA ON EXERTION 06/04/2009  . PERCUTANEOUS TRANSLUMINAL CORONARY ANGIOPLASTY, HX OF 06/04/2009   Gabriela Eves, PT, DPT 03/10/2018, 12:18 PM  [facility] [address] [place], [place], [address] Phone: [phone]   Fax:  [phone]  Name: [name] [name] MRN: [id number] Date of Birth: 08-19-1946

## 2018-03-15 ENCOUNTER — Ambulatory Visit: Payer: Medicare Other | Attending: Physician Assistant | Admitting: Physical Therapy

## 2018-03-15 DIAGNOSIS — M25511 Pain in right shoulder: Secondary | ICD-10-CM

## 2018-03-15 DIAGNOSIS — M25611 Stiffness of right shoulder, not elsewhere classified: Secondary | ICD-10-CM

## 2018-03-15 NOTE — Therapy (Signed)
[facility] [place], [place], [address] Phone: [phone]   Fax:  [phone]  Physical Therapy Treatment  Patient Details  Name: [name] [name] MRN: [id number] Date of Birth: [birth year] Referring Provider: Erskine Emery PA-C   Encounter Date: 03/15/2018  PT End of Session - 03/15/18 1231    Visit Number  8    Number of Visits  16    Date for PT Re-Evaluation  05/18/18    Authorization Type  FOTO AT LEAST EVERY 5TH VISIT, 10TH VISIT PROGRESS NOTE AND KX MODIFIER AFTER THE 15 VISIT.    PT Start Time  0945    PT Stop Time  1039    PT Time Calculation (min)  54 min    Activity Tolerance  Patient tolerated treatment well    Behavior During Therapy  WFL for tasks assessed/performed       Past Medical History:  Diagnosis Date  . Asthma, chronic 06/02/2013  . Carpal tunnel syndrome 04/17/2015   Bilateral  . Depression 06/02/2013  . DYSPNEA ON EXERTION 06/04/2009   Qualifier: Diagnosis of  By: Domenic Polite, MD, Phillips Hay   . GERD (gastroesophageal reflux disease)   . Hereditary and idiopathic peripheral neuropathy 04/17/2015  . Hyperlipemia 06/02/2013  . LEG PAIN, BILATERAL 06/04/2009   Qualifier: Diagnosis of  By: Domenic Polite, MD, Phillips Hay   . PERCUTANEOUS TRANSLUMINAL CORONARY ANGIOPLASTY, HX OF 06/04/2009   Qualifier: Diagnosis of  By: Domenic Polite, MD, Phillips Hay   . Prostate CA [facility])     Past Surgical History:  Procedure Laterality Date  . CARDIAC CATHETERIZATION  1997  . HIATAL HERNIA REPAIR    . LAPAROSCOPIC GASTRIC BANDING  2000   [facility]   . ROBOT ASSISTED LAPAROSCOPIC RADICAL PROSTATECTOMY      There were no vitals filed for this visit.  Subjective Assessment - 03/15/18 1228    Subjective  Those treatments are helping.    Patient Stated Goals  Improve mobility with decreased pain.    Currently in Pain?  Yes    Pain Score  2     Pain Location  Shoulder    Pain Orientation  Right    Pain Descriptors / Indicators  Aching    Pain Onset  1 to 4 weeks ago                       [facility] Adult PT Treatment/Exercise - 03/15/18 0001      Exercises   Exercises  Shoulder      Shoulder Exercises: ROM/Strengthening   UBE (Upper Arm Bike)  90 RPM's 5 minutes forward and 5 minutes backward (10 minutes total).      Ultrasound   Ultrasound Location  -- Right anterior/lateral shoulder.    Ultrasound Parameters  Combo e'stim/U/S at 1.50 W/CM2 x 10 minutes.    Ultrasound Goals  Pain      Vasopneumatic   Number Minutes Vasopneumatic   20 minutes    Vasopnuematic Location   -- Right shoulder.    Vasopneumatic Pressure  Medium               PT Short Term Goals - 02/25/18 0956      PT SHORT TERM GOAL #1   Title  STG's=LTG's.        PT Long Term Goals - 02/25/18 0957      PT LONG TERM GOAL #1   Title  Ind with a HEP.  Time  8    Period  Weeks    Status  New      PT LONG TERM GOAL #2   Title  Perform ADL's with pain not > 2-3/10.    Time  8    Period  Weeks    Status  On-going      PT LONG TERM GOAL #3   Title  Active right shoulder ER to 80 degrees+ to allow for easily donning/doffing of apparel.    Time  8    Period  Weeks    Status  New            Plan - 03/15/18 1232    Clinical Impression Statement  Patient responding very well to treatments.  He is getting a great result with combo e'stim to right ant/lat shoulder region.    PT Treatment/Interventions  ADLs/Self Care Home Management;Cryotherapy;Electrical Stimulation;Ultrasound;Moist Heat;Therapeutic activities;Therapeutic exercise;Patient/family education;Neuromuscular re-education;Manual techniques    PT Next Visit Plan  Continue with AAROM; pulleys; UBE and UE ranger.  Passive range of motion to increase right shoulder ER.  Begin with isometrics and then to yellow theraband.  STW/M to biceps and anterior shoulder E'stim and electrical stimulation.    Consulted and Agree with  Plan of Care  Patient       Patient will benefit from skilled therapeutic intervention in order to improve the following deficits and impairments:  Decreased activity tolerance, Decreased range of motion, Pain, Decreased strength  Visit Diagnosis: Acute pain of right shoulder  Stiffness of right shoulder, not elsewhere classified     Problem List Patient Active Problem List   Diagnosis Date Noted  . H/O arthroscopy of shoulder 02/10/2018  . Unilateral primary osteoarthritis, left knee 11/25/2016  . Unilateral primary osteoarthritis, right knee 11/25/2016  . Essential hypertension 06/28/2015  . H/O prostate cancer 06/28/2015  . Hereditary and idiopathic peripheral neuropathy 04/17/2015  . Carpal tunnel syndrome 04/17/2015  . Hyperlipemia 06/02/2013  . Asthma, chronic 06/02/2013  . Depression 06/02/2013  . GERD 06/04/2009  . LEG PAIN, BILATERAL 06/04/2009  . DYSPNEA ON EXERTION 06/04/2009  . PERCUTANEOUS TRANSLUMINAL CORONARY ANGIOPLASTY, HX OF 06/04/2009    Jemya Depierro, [place] MPT 03/15/2018, 12:34 PM  [facility] [address] [place], [place], [address] Phone: [phone]   Fax:  [phone]  Name: [name] [name] MRN: [id number] Date of Birth: 1946-07-08

## 2018-03-22 ENCOUNTER — Ambulatory Visit: Payer: Medicare Other | Admitting: Physical Therapy

## 2018-03-22 ENCOUNTER — Encounter: Payer: Self-pay | Admitting: Physical Therapy

## 2018-03-22 DIAGNOSIS — M25611 Stiffness of right shoulder, not elsewhere classified: Secondary | ICD-10-CM | POA: Diagnosis not present

## 2018-03-22 DIAGNOSIS — M25511 Pain in right shoulder: Secondary | ICD-10-CM

## 2018-03-22 NOTE — Therapy (Signed)
[facility] [place], [place], [address] Phone: [phone]   Fax:  [phone]  Physical Therapy Treatment  Patient Details  Name: [name] [name] MRN: [id number] Date of Birth: 21-Dec-1945 Referring Provider: Erskine Emery PA-C   Encounter Date: 03/22/2018  PT End of Session - 03/22/18 1032    Visit Number  9    Number of Visits  16    Date for PT Re-Evaluation  05/18/18    Authorization Type  FOTO AT LEAST EVERY 5TH VISIT, 10TH VISIT PROGRESS NOTE AND KX MODIFIER AFTER THE 15 VISIT.    PT Start Time  0945    PT Stop Time  1038    PT Time Calculation (min)  53 min    Activity Tolerance  Patient tolerated treatment well    Behavior During Therapy  WFL for tasks assessed/performed       Past Medical History:  Diagnosis Date  . Asthma, chronic 06/02/2013  . Carpal tunnel syndrome 04/17/2015   Bilateral  . Depression 06/02/2013  . DYSPNEA ON EXERTION 06/04/2009   Qualifier: Diagnosis of  By: Domenic Polite, MD, Phillips Hay   . GERD (gastroesophageal reflux disease)   . Hereditary and idiopathic peripheral neuropathy 04/17/2015  . Hyperlipemia 06/02/2013  . LEG PAIN, BILATERAL 06/04/2009   Qualifier: Diagnosis of  By: Domenic Polite, MD, Phillips Hay   . PERCUTANEOUS TRANSLUMINAL CORONARY ANGIOPLASTY, HX OF 06/04/2009   Qualifier: Diagnosis of  By: Domenic Polite, MD, Phillips Hay   . Prostate CA [facility])     Past Surgical History:  Procedure Laterality Date  . CARDIAC CATHETERIZATION  1997  . HIATAL HERNIA REPAIR    . LAPAROSCOPIC GASTRIC BANDING  2000   [facility]   . ROBOT ASSISTED LAPAROSCOPIC RADICAL PROSTATECTOMY      There were no vitals filed for this visit.  Subjective Assessment - 03/22/18 1026    Subjective  Patient reported pain comes and goes and reports no pain at the moment.    Pertinent History  CTS; angioplasty.    Patient Stated Goals  Improve mobility with decreased pain.    Currently in Pain?   No/denies         [facility] PT Assessment - 03/22/18 0001      Assessment   Medical Diagnosis  Acute right shoulder pain.                   [facility] Adult PT Treatment/Exercise - 03/22/18 0001      Exercises   Exercises  Shoulder      Shoulder Exercises: ROM/Strengthening   UBE (Upper Arm Bike)  90 RPM's 5 minutes forward and 5 minutes backward (10 minutes total).      Modalities   Modalities  Electrical Stimulation;Ultrasound      Acupuncturist Location  Right shoulder.    Electrical Stimulation Action  IFC    Electrical Stimulation Parameters  80-150 hz x15 min    Electrical Stimulation Goals  Pain      Ultrasound   Ultrasound Location  Right anterior and lateral shoulder    Ultrasound Parameters  combo e-stim/US at 1.5 w/cm2 x10 mins    Ultrasound Goals  Pain      Vasopneumatic   Number Minutes Vasopneumatic   15 minutes    Vasopnuematic Location   Shoulder    Vasopneumatic Pressure  Low    Vasopneumatic Temperature   35  PT Short Term Goals - 02/25/18 0956      PT SHORT TERM GOAL #1   Title  STG's=LTG's.        PT Long Term Goals - 02/25/18 0957      PT LONG TERM GOAL #1   Title  Ind with a HEP.    Time  8    Period  Weeks    Status  New      PT LONG TERM GOAL #2   Title  Perform ADL's with pain not > 2-3/10.    Time  8    Period  Weeks    Status  On-going      PT LONG TERM GOAL #3   Title  Active right shoulder ER to 80 degrees+ to allow for easily donning/doffing of apparel.    Time  8    Period  Weeks    Status  New            Plan - 03/22/18 1032    Clinical Impression Statement  Patient was able to tolerate treatment well. Patient reports combo e-stim/US has significantly improved discomfort and pain. Normal response to modalities upon removal.    Clinical Presentation  Stable    Clinical Decision Making  Low    Rehab Potential  Good    PT Frequency  2x / week    PT  Duration  8 weeks    PT Treatment/Interventions  ADLs/Self Care Home Management;Cryotherapy;Electrical Stimulation;Ultrasound;Moist Heat;Therapeutic activities;Therapeutic exercise;Patient/family education;Neuromuscular re-education;Manual techniques    PT Next Visit Plan  Progress note Assess goals. cont with AAROM; pulleys; UBE and UE ranger.  Passive range of motion to increase right shoulder ER.  Begin with isometrics and then to yellow theraband.  STW/M to biceps and anterior shoulder E'stim and electrical stimulation.    Consulted and Agree with Plan of Care  Patient       Patient will benefit from skilled therapeutic intervention in order to improve the following deficits and impairments:  Decreased activity tolerance, Decreased range of motion, Pain, Decreased strength  Visit Diagnosis: Acute pain of right shoulder  Stiffness of right shoulder, not elsewhere classified     Problem List Patient Active Problem List   Diagnosis Date Noted  . H/O arthroscopy of shoulder 02/10/2018  . Unilateral primary osteoarthritis, left knee 11/25/2016  . Unilateral primary osteoarthritis, right knee 11/25/2016  . Essential hypertension 06/28/2015  . H/O prostate cancer 06/28/2015  . Hereditary and idiopathic peripheral neuropathy 04/17/2015  . Carpal tunnel syndrome 04/17/2015  . Hyperlipemia 06/02/2013  . Asthma, chronic 06/02/2013  . Depression 06/02/2013  . GERD 06/04/2009  . LEG PAIN, BILATERAL 06/04/2009  . DYSPNEA ON EXERTION 06/04/2009  . PERCUTANEOUS TRANSLUMINAL CORONARY ANGIOPLASTY, HX OF 06/04/2009    Gabriela Eves, PT, DPT 03/22/2018, 10:46 AM  [facility] [address] [place], [place], [address] Phone: [phone]   Fax:  [phone]  Name: [name] [name] MRN: [id number] Date of Birth: Dec 30, 1945

## 2018-03-24 ENCOUNTER — Ambulatory Visit: Payer: Medicare Other | Admitting: Physical Therapy

## 2018-03-24 ENCOUNTER — Encounter: Payer: Self-pay | Admitting: Physical Therapy

## 2018-03-24 DIAGNOSIS — M25511 Pain in right shoulder: Secondary | ICD-10-CM

## 2018-03-24 DIAGNOSIS — M25611 Stiffness of right shoulder, not elsewhere classified: Secondary | ICD-10-CM | POA: Diagnosis not present

## 2018-03-24 NOTE — Therapy (Signed)
[facility] [place], [place], [address] Phone: [phone]   Fax:  [phone]  Physical Therapy Treatment   Progress Note Reporting Period 02/17/18 to 03/24/18  See note below for Objective Data and Assessment of Progress/Goals.       Patient Details  Name: [name] [name] MRN: [id number] Date of Birth: 1945/12/12 Referring Provider: Erskine Emery PA-C   Encounter Date: 03/24/2018  PT End of Session - 03/24/18 0951    Visit Number  10    Number of Visits  16    Date for PT Re-Evaluation  05/18/18    Authorization Type  FOTO AT LEAST EVERY 5TH VISIT, 10TH VISIT PROGRESS NOTE AND KX MODIFIER AFTER THE 15 VISIT.    PT Start Time  0945    PT Stop Time  1039    PT Time Calculation (min)  54 min    Activity Tolerance  Patient tolerated treatment well    Behavior During Therapy  WFL for tasks assessed/performed       Past Medical History:  Diagnosis Date  . Asthma, chronic 06/02/2013  . Carpal tunnel syndrome 04/17/2015   Bilateral  . Depression 06/02/2013  . DYSPNEA ON EXERTION 06/04/2009   Qualifier: Diagnosis of  By: Domenic Polite, MD, Phillips Hay   . GERD (gastroesophageal reflux disease)   . Hereditary and idiopathic peripheral neuropathy 04/17/2015  . Hyperlipemia 06/02/2013  . LEG PAIN, BILATERAL 06/04/2009   Qualifier: Diagnosis of  By: Domenic Polite, MD, Phillips Hay   . PERCUTANEOUS TRANSLUMINAL CORONARY ANGIOPLASTY, HX OF 06/04/2009   Qualifier: Diagnosis of  By: Domenic Polite, MD, Phillips Hay   . Prostate CA [facility])     Past Surgical History:  Procedure Laterality Date  . CARDIAC CATHETERIZATION  1997  . HIATAL HERNIA REPAIR    . LAPAROSCOPIC GASTRIC BANDING  2000   [facility]   . ROBOT ASSISTED LAPAROSCOPIC RADICAL PROSTATECTOMY      There were no vitals filed for this visit.  Subjective Assessment - 03/24/18 0951    Subjective  Patient reports still having difficulties with combing hair but  reports no pain when lifting arm overhead with elbow straight.    Pertinent History  CTS; angioplasty.    Patient Stated Goals  Improve mobility with decreased pain.    Currently in Pain?  No/denies no pain at rest    Pain Score  2     Pain Location  Shoulder    Pain Orientation  Right    Pain Descriptors / Indicators  Aching    Pain Type  Surgical pain    Pain Onset  1 to 4 weeks ago         [facility] PT Assessment - 03/24/18 0001      Assessment   Medical Diagnosis  Acute right shoulder pain.      ROM / Strength   AROM / PROM / Strength  PROM      AROM   Overall AROM Comments  Full flexion of right shoulder in supine and ER to 65 degrees.      PROM   PROM Assessment Site  Shoulder    Right/Left Shoulder  Right    Right Shoulder External Rotation  71 Degrees                   [facility] Adult PT Treatment/Exercise - 03/24/18 0001      Exercises   Exercises  Shoulder      Shoulder Exercises: ROM/Strengthening  UBE (Upper Arm Bike)  60 RPM's 5 minutes forward and 5 minutes backward (10 minutes total).      Modalities   Modalities  Electrical Stimulation;Ultrasound      Acupuncturist Location  Right shoulder.    Electrical Stimulation Action  IFC    Electrical Stimulation Parameters  80-150 hz x15 min    Electrical Stimulation Goals  Pain      Ultrasound   Ultrasound Location  right anterior and lateral shoulder    Ultrasound Parameters  combo e-stim/US 100%  67mhz x 12 min    Ultrasound Goals  Pain      Vasopneumatic   Number Minutes Vasopneumatic   15 minutes    Vasopnuematic Location   Shoulder    Vasopneumatic Pressure  Low    Vasopneumatic Temperature   36               PT Short Term Goals - 03/24/18 1000      PT SHORT TERM GOAL #1   Title  STG's=LTG's.        PT Long Term Goals - 03/24/18 1001      PT LONG TERM GOAL #1   Title  Ind with a HEP.    Time  8    Period  Weeks    Status  Achieved       PT LONG TERM GOAL #2   Title  Perform ADL's with pain not > 2-3/10.    Time  8    Period  Weeks    Status  On-going 3-7/48 with certain ADLs      PT LONG TERM GOAL #3   Title  Active right shoulder ER to 80 degrees+ to allow for easily donning/doffing of apparel.    Time  8    Period  Weeks    Status  On-going 65 AROM, 71 PROM            Plan - 03/24/18 1415    Clinical Impression Statement  Patient was able to tolerate treatment well. Patient demonstrates [facility] shoulder flexion and abduction AROM with elbow extended but reports pain in shoulder when performing elbow flexed shoulder flexion and ABD. Patient's FOTO score of 39% limitation demonstrates improved function with ADLs and functoinal tasks. Normal response to modalities upon removal.     Clinical Presentation  Stable    Clinical Decision Making  Low    PT Frequency  2x / week    PT Duration  8 weeks    PT Treatment/Interventions  ADLs/Self Care Home Management;Cryotherapy;Electrical Stimulation;Ultrasound;Moist Heat;Therapeutic activities;Therapeutic exercise;Patient/family education;Neuromuscular re-education;Manual techniques    PT Next Visit Plan  cont with AAROM; pulleys; UBE and UE ranger.  Passive range of motion to increase right shoulder ER.  Begin with isometrics and then to yellow theraband.  STW/M to biceps and anterior shoulder E'stim and electrical stimulation.    Consulted and Agree with Plan of Care  Patient       Patient will benefit from skilled therapeutic intervention in order to improve the following deficits and impairments:  Decreased activity tolerance, Decreased range of motion, Pain, Decreased strength  Visit Diagnosis: Acute pain of right shoulder  Stiffness of right shoulder, not elsewhere classified     Problem List Patient Active Problem List   Diagnosis Date Noted  . H/O arthroscopy of shoulder 02/10/2018  . Unilateral primary osteoarthritis, left knee 11/25/2016  . Unilateral  primary osteoarthritis, right knee 11/25/2016  . Essential hypertension 06/28/2015  .  H/O prostate cancer 06/28/2015  . Hereditary and idiopathic peripheral neuropathy 04/17/2015  . Carpal tunnel syndrome 04/17/2015  . Hyperlipemia 06/02/2013  . Asthma, chronic 06/02/2013  . Depression 06/02/2013  . GERD 06/04/2009  . LEG PAIN, BILATERAL 06/04/2009  . DYSPNEA ON EXERTION 06/04/2009  . PERCUTANEOUS TRANSLUMINAL CORONARY ANGIOPLASTY, HX OF 06/04/2009    Gabriela Eves, PT, DPT 03/24/2018, 2:23 PM  [facility] [address] [place], [place], [address] Phone: [phone]   Fax:  [phone]  Name: KAMAURY CUTBIRTH MRN: [id number] Date of Birth: May 12, 1946

## 2018-03-29 ENCOUNTER — Encounter: Payer: Self-pay | Admitting: Physical Therapy

## 2018-03-29 ENCOUNTER — Ambulatory Visit: Payer: Medicare Other | Admitting: Physical Therapy

## 2018-03-29 DIAGNOSIS — M25611 Stiffness of right shoulder, not elsewhere classified: Secondary | ICD-10-CM | POA: Diagnosis not present

## 2018-03-29 DIAGNOSIS — M25511 Pain in right shoulder: Secondary | ICD-10-CM | POA: Diagnosis not present

## 2018-03-29 NOTE — Therapy (Signed)
[facility] [place], [place], [address] Phone: [phone]   Fax:  [phone]  Physical Therapy Treatment  Patient Details  Name: [name] [name] MRN: [id number] Date of Birth: [birth year] Referring Provider: Erskine Emery PA-C   Encounter Date: 03/29/2018  PT End of Session - 03/29/18 0954    Visit Number  11    Number of Visits  16    Date for PT Re-Evaluation  05/18/18    Authorization Type  FOTO AT LEAST EVERY 5TH VISIT, 10TH VISIT PROGRESS NOTE AND KX MODIFIER AFTER THE 15 VISIT.    PT Start Time  0945    PT Stop Time  1040    PT Time Calculation (min)  55 min    Activity Tolerance  Patient tolerated treatment well    Behavior During Therapy  WFL for tasks assessed/performed       Past Medical History:  Diagnosis Date  . Asthma, chronic 06/02/2013  . Carpal tunnel syndrome 04/17/2015   Bilateral  . Depression 06/02/2013  . DYSPNEA ON EXERTION 06/04/2009   Qualifier: Diagnosis of  By: Domenic Polite, MD, Phillips Hay   . GERD (gastroesophageal reflux disease)   . Hereditary and idiopathic peripheral neuropathy 04/17/2015  . Hyperlipemia 06/02/2013  . LEG PAIN, BILATERAL 06/04/2009   Qualifier: Diagnosis of  By: Domenic Polite, MD, Phillips Hay   . PERCUTANEOUS TRANSLUMINAL CORONARY ANGIOPLASTY, HX OF 06/04/2009   Qualifier: Diagnosis of  By: Domenic Polite, MD, Phillips Hay   . Prostate CA [facility])     Past Surgical History:  Procedure Laterality Date  . CARDIAC CATHETERIZATION  1997  . HIATAL HERNIA REPAIR    . LAPAROSCOPIC GASTRIC BANDING  2000   [facility]   . ROBOT ASSISTED LAPAROSCOPIC RADICAL PROSTATECTOMY      There were no vitals filed for this visit.  Subjective Assessment - 03/29/18 0948    Subjective  Patient reported no new complaints today however reported he experienced sharp burning pain yesterday. He utilized ice to calm pain down which helped.    Pertinent History  CTS; angioplasty.     Patient Stated Goals  Improve mobility with decreased pain.    Currently in Pain?  No/denies no pain at rest         [facility] PT Assessment - 03/29/18 0001      Assessment   Medical Diagnosis  Acute right shoulder pain.                   [facility] Adult PT Treatment/Exercise - 03/29/18 0001      Exercises   Exercises  Shoulder      Shoulder Exercises: ROM/Strengthening   UBE (Upper Arm Bike)  60 RPM's 5 minutes forward and 5 minutes backward (10 minutes total).      Modalities   Modalities  Electrical Stimulation;Ultrasound      Acupuncturist Location  Right shoulder.    Electrical Stimulation Action  IFC    Electrical Stimulation Parameters  80-150 hz x15 min    Electrical Stimulation Goals  Pain      Ultrasound   Ultrasound Location  right anterior and lateral shoulder    Ultrasound Parameters  combo e-stim/US 100%; 1 mHz; x12    Ultrasound Goals  Pain      Vasopneumatic   Number Minutes Vasopneumatic   15 minutes    Vasopnuematic Location   Shoulder    Vasopneumatic Pressure  Low    Vasopneumatic  Temperature   36               PT Short Term Goals - 03/24/18 1000      PT SHORT TERM GOAL #1   Title  STG's=LTG's.        PT Long Term Goals - 03/24/18 1001      PT LONG TERM GOAL #1   Title  Ind with a HEP.    Time  8    Period  Weeks    Status  Achieved      PT LONG TERM GOAL #2   Title  Perform ADL's with pain not > 2-3/10.    Time  8    Period  Weeks    Status  On-going 5-1/76 with certain ADLs      PT LONG TERM GOAL #3   Title  Active right shoulder ER to 80 degrees+ to allow for easily donning/doffing of apparel.    Time  8    Period  Weeks    Status  On-going 65 AROM, 71 PROM            Plan - 03/29/18 1226    Clinical Impression Statement  Patient continues to tolerate treatment with no reports of pain during session. Patient and PT discussed exercises being performed at home and  emphasized to work on [facility] ER with cane to improve ROM. Patient reported agreement. Normal response to modalities upon removal.    Clinical Presentation  Stable    Clinical Decision Making  Low    Rehab Potential  Good    PT Frequency  2x / week    PT Duration  8 weeks    PT Treatment/Interventions  ADLs/Self Care Home Management;Cryotherapy;Electrical Stimulation;Ultrasound;Moist Heat;Therapeutic activities;Therapeutic exercise;Patient/family education;Neuromuscular re-education;Manual techniques    PT Next Visit Plan  cont with AAROM; pulleys; UBE and UE ranger.  Passive range of motion to increase right shoulder ER.  Begin with isometrics and then to yellow theraband.  STW/M to biceps and anterior shoulder E'stim and electrical stimulation.    Consulted and Agree with Plan of Care  Patient       Patient will benefit from skilled therapeutic intervention in order to improve the following deficits and impairments:  Decreased activity tolerance, Decreased range of motion, Pain, Decreased strength  Visit Diagnosis: Acute pain of right shoulder  Stiffness of right shoulder, not elsewhere classified     Problem List Patient Active Problem List   Diagnosis Date Noted  . H/O arthroscopy of shoulder 02/10/2018  . Unilateral primary osteoarthritis, left knee 11/25/2016  . Unilateral primary osteoarthritis, right knee 11/25/2016  . Essential hypertension 06/28/2015  . H/O prostate cancer 06/28/2015  . Hereditary and idiopathic peripheral neuropathy 04/17/2015  . Carpal tunnel syndrome 04/17/2015  . Hyperlipemia 06/02/2013  . Asthma, chronic 06/02/2013  . Depression 06/02/2013  . GERD 06/04/2009  . LEG PAIN, BILATERAL 06/04/2009  . DYSPNEA ON EXERTION 06/04/2009  . PERCUTANEOUS TRANSLUMINAL CORONARY ANGIOPLASTY, HX OF 06/04/2009    Gabriela Eves, PT, DPT 03/29/2018, 12:30 PM  [facility] [address] [place], [place],  [address] Phone: [phone]   Fax:  [phone]  Name: [name] [name] MRN: [id number] Date of Birth: 1946/05/31

## 2018-03-31 ENCOUNTER — Ambulatory Visit: Payer: Medicare Other | Admitting: Physical Therapy

## 2018-03-31 DIAGNOSIS — M25511 Pain in right shoulder: Secondary | ICD-10-CM

## 2018-03-31 DIAGNOSIS — M25611 Stiffness of right shoulder, not elsewhere classified: Secondary | ICD-10-CM | POA: Diagnosis not present

## 2018-03-31 NOTE — Therapy (Signed)
[facility] [place], [place], [address] Phone: [phone]   Fax:  [phone]  Physical Therapy Treatment  Patient Details  Name: [name] [name] MRN: [id number] Date of Birth: 1945-09-17 Referring Provider: Erskine Emery PA-C   Encounter Date: 03/31/2018  PT End of Session - 03/31/18 0954    Visit Number  12    Number of Visits  16    Date for PT Re-Evaluation  05/18/18    Authorization Type  FOTO AT LEAST EVERY 5TH VISIT, 10TH VISIT PROGRESS NOTE AND KX MODIFIER AFTER THE 15 VISIT.    PT Start Time  0945    PT Stop Time  1036    PT Time Calculation (min)  51 min    Activity Tolerance  Patient tolerated treatment well    Behavior During Therapy  WFL for tasks assessed/performed       Past Medical History:  Diagnosis Date  . Asthma, chronic 06/02/2013  . Carpal tunnel syndrome 04/17/2015   Bilateral  . Depression 06/02/2013  . DYSPNEA ON EXERTION 06/04/2009   Qualifier: Diagnosis of  By: Domenic Polite, MD, Phillips Hay   . GERD (gastroesophageal reflux disease)   . Hereditary and idiopathic peripheral neuropathy 04/17/2015  . Hyperlipemia 06/02/2013  . LEG PAIN, BILATERAL 06/04/2009   Qualifier: Diagnosis of  By: Domenic Polite, MD, Phillips Hay   . PERCUTANEOUS TRANSLUMINAL CORONARY ANGIOPLASTY, HX OF 06/04/2009   Qualifier: Diagnosis of  By: Domenic Polite, MD, Phillips Hay   . Prostate CA [facility])     Past Surgical History:  Procedure Laterality Date  . CARDIAC CATHETERIZATION  1997  . HIATAL HERNIA REPAIR    . LAPAROSCOPIC GASTRIC BANDING  2000   [facility]   . ROBOT ASSISTED LAPAROSCOPIC RADICAL PROSTATECTOMY      There were no vitals filed for this visit.      [facility] PT Assessment - 03/31/18 0001      Assessment   Medical Diagnosis  Acute right shoulder pain.                   [facility] Adult PT Treatment/Exercise - 03/31/18 0001      Exercises   Exercises  Shoulder      Shoulder  Exercises: ROM/Strengthening   UBE (Upper Arm Bike)  60 RPM's 5 minutes forward and 5 minutes backward (10 minutes total).      Modalities   Modalities  Electrical Stimulation;Ultrasound      Acupuncturist Location  Right shoulder.    Electrical Stimulation Action  IFC    Electrical Stimulation Parameters  80-150 hz x15 min    Electrical Stimulation Goals  Pain      Ultrasound   Ultrasound Location  Right anterior and lateral shoulder    Ultrasound Parameters  combo e-stim/US, 1 mhz, 100% 1.5 w/cm2    Ultrasound Goals  Pain      Vasopneumatic   Number Minutes Vasopneumatic   15 minutes    Vasopnuematic Location   Shoulder    Vasopneumatic Pressure  Low    Vasopneumatic Temperature   36               PT Short Term Goals - 03/24/18 1000      PT SHORT TERM GOAL #1   Title  STG's=LTG's.        PT Long Term Goals - 03/24/18 1001      PT LONG TERM GOAL #1   Title  Ind with a HEP.    Time  8    Period  Weeks    Status  Achieved      PT LONG TERM GOAL #2   Title  Perform ADL's with pain not > 2-3/10.    Time  8    Period  Weeks    Status  On-going 0-1/09 with certain ADLs      PT LONG TERM GOAL #3   Title  Active right shoulder ER to 80 degrees+ to allow for easily donning/doffing of apparel.    Time  8    Period  Weeks    Status  On-going 65 AROM, 71 PROM            Plan - 03/31/18 1258    Clinical Impression Statement  Patient was able to complete treatment well with no reports of pain. Patient noted with improved function and use of R UE and states he no longer has sharp pain while driving anymore. Patient and PT discussed continuing last 4 visits then DC. Patient reported agreement.     Clinical Presentation  Stable    Clinical Decision Making  Low    Rehab Potential  Good    PT Frequency  2x / week    PT Duration  8 weeks    PT Treatment/Interventions  ADLs/Self Care Home Management;Cryotherapy;Electrical  Stimulation;Ultrasound;Moist Heat;Therapeutic activities;Therapeutic exercise;Patient/family education;Neuromuscular re-education;Manual techniques    PT Next Visit Plan  cont with e-stim/combo; modalities prn for pain relief.    Consulted and Agree with Plan of Care  Patient       Patient will benefit from skilled therapeutic intervention in order to improve the following deficits and impairments:  Decreased activity tolerance, Decreased range of motion, Pain, Decreased strength  Visit Diagnosis: Acute pain of right shoulder  Stiffness of right shoulder, not elsewhere classified     Problem List Patient Active Problem List   Diagnosis Date Noted  . H/O arthroscopy of shoulder 02/10/2018  . Unilateral primary osteoarthritis, left knee 11/25/2016  . Unilateral primary osteoarthritis, right knee 11/25/2016  . Essential hypertension 06/28/2015  . H/O prostate cancer 06/28/2015  . Hereditary and idiopathic peripheral neuropathy 04/17/2015  . Carpal tunnel syndrome 04/17/2015  . Hyperlipemia 06/02/2013  . Asthma, chronic 06/02/2013  . Depression 06/02/2013  . GERD 06/04/2009  . LEG PAIN, BILATERAL 06/04/2009  . DYSPNEA ON EXERTION 06/04/2009  . PERCUTANEOUS TRANSLUMINAL CORONARY ANGIOPLASTY, HX OF 06/04/2009    Gabriela Eves, PT, DPT  03/31/2018, 1:08 PM  [facility] [address] [place], [place], [address] Phone: [phone]   Fax:  [phone]  Name: RAYLAND HAMED MRN: [id number] Date of Birth: 1946-05-20

## 2018-04-05 ENCOUNTER — Ambulatory Visit: Payer: Medicare Other | Admitting: Physical Therapy

## 2018-04-05 DIAGNOSIS — M25511 Pain in right shoulder: Secondary | ICD-10-CM | POA: Diagnosis not present

## 2018-04-05 DIAGNOSIS — M25611 Stiffness of right shoulder, not elsewhere classified: Secondary | ICD-10-CM | POA: Diagnosis not present

## 2018-04-05 NOTE — Therapy (Signed)
[name], [place], [address] Phone: [phone]   Fax:  [phone]  Physical Therapy Treatment  Patient Details  Name: [name] [name] MRN: [id number] Date of Birth: 1946-01-09 Referring Provider: Erskine Emery PA-C   Encounter Date: 04/05/2018  PT End of Session - 04/05/18 1020    Visit Number  13    Number of Visits  16    Date for PT Re-Evaluation  05/18/18    Authorization Type  FOTO AT LEAST EVERY 5TH VISIT, 10TH VISIT PROGRESS NOTE AND KX MODIFIER AFTER THE 15 VISIT.    PT Start Time  0945    PT Stop Time  1035    PT Time Calculation (min)  50 min    Activity Tolerance  Patient tolerated treatment well    Behavior During Therapy  WFL for tasks assessed/performed       Past Medical History:  Diagnosis Date  . Asthma, chronic 06/02/2013  . Carpal tunnel syndrome 04/17/2015   Bilateral  . Depression 06/02/2013  . DYSPNEA ON EXERTION 06/04/2009   Qualifier: Diagnosis of  By: Domenic Polite, MD, Phillips Hay   . GERD (gastroesophageal reflux disease)   . Hereditary and idiopathic peripheral neuropathy 04/17/2015  . Hyperlipemia 06/02/2013  . LEG PAIN, BILATERAL 06/04/2009   Qualifier: Diagnosis of  By: Domenic Polite, MD, Phillips Hay   . PERCUTANEOUS TRANSLUMINAL CORONARY ANGIOPLASTY, HX OF 06/04/2009   Qualifier: Diagnosis of  By: Domenic Polite, MD, Phillips Hay   . Prostate CA [facility])     Past Surgical History:  Procedure Laterality Date  . CARDIAC CATHETERIZATION  1997  . HIATAL HERNIA REPAIR    . LAPAROSCOPIC GASTRIC BANDING  2000   [facility]   . ROBOT ASSISTED LAPAROSCOPIC RADICAL PROSTATECTOMY      There were no vitals filed for this visit.  Subjective Assessment - 04/05/18 1019    Subjective  Patient reports his shoulder has his moments. Reports combo e-stim/us has helped with improving UE function.    Pertinent History  CTS; angioplasty.    Patient Stated Goals  Improve mobility with  decreased pain.    Currently in Pain?  No/denies         [facility] PT Assessment - 04/05/18 0001      Assessment   Medical Diagnosis  Acute right shoulder pain.                   [facility] Adult PT Treatment/Exercise - 04/05/18 0001      Exercises   Exercises  Shoulder      Shoulder Exercises: ROM/Strengthening   UBE (Upper Arm Bike)  60 RPM's 5 minutes forward and 5 minutes backward (10 minutes total).      Modalities   Modalities  Electrical Stimulation;Ultrasound      Acupuncturist Location  Right shoulder.    Electrical Stimulation Action  IFC    Electrical Stimulation Parameters  80-150 hz x15 min    Electrical Stimulation Goals  Pain      Ultrasound   Ultrasound Location  Right anterior shoulder and lateral shoulder    Ultrasound Parameters  combo e-stim/US 1 mhz, 100% 1.5 w/cm2    Ultrasound Goals  Pain      Vasopneumatic   Number Minutes Vasopneumatic   15 minutes    Vasopnuematic Location   Shoulder    Vasopneumatic Pressure  Low    Vasopneumatic Temperature   34  PT Short Term Goals - 03/24/18 1000      PT SHORT TERM GOAL #1   Title  STG's=LTG's.        PT Long Term Goals - 03/24/18 1001      PT LONG TERM GOAL #1   Title  Ind with a HEP.    Time  8    Period  Weeks    Status  Achieved      PT LONG TERM GOAL #2   Title  Perform ADL's with pain not > 2-3/10.    Time  8    Period  Weeks    Status  On-going 5-1/88 with certain ADLs      PT LONG TERM GOAL #3   Title  Active right shoulder ER to 80 degrees+ to allow for easily donning/doffing of apparel.    Time  8    Period  Weeks    Status  On-going 65 AROM, 71 PROM            Plan - 04/05/18 1020    Clinical Impression Statement  Patient was able to tolerate treatment with no reports of pain. Patient continues to tolerate combo US/E-stim with no adverse affects. Normal response to modalities upon removal.     Clinical  Presentation  Stable    Clinical Decision Making  Low    Rehab Potential  Good    PT Frequency  2x / week    PT Duration  8 weeks    PT Treatment/Interventions  ADLs/Self Care Home Management;Cryotherapy;Electrical Stimulation;Ultrasound;Moist Heat;Therapeutic activities;Therapeutic exercise;Patient/family education;Neuromuscular re-education;Manual techniques    PT Next Visit Plan  cont with e-stim/combo; modalities prn for pain relief.    Consulted and Agree with Plan of Care  Patient       Patient will benefit from skilled therapeutic intervention in order to improve the following deficits and impairments:  Decreased activity tolerance, Decreased range of motion, Pain, Decreased strength  Visit Diagnosis: Acute pain of right shoulder  Stiffness of right shoulder, not elsewhere classified     Problem List Patient Active Problem List   Diagnosis Date Noted  . H/O arthroscopy of shoulder 02/10/2018  . Unilateral primary osteoarthritis, left knee 11/25/2016  . Unilateral primary osteoarthritis, right knee 11/25/2016  . Essential hypertension 06/28/2015  . H/O prostate cancer 06/28/2015  . Hereditary and idiopathic peripheral neuropathy 04/17/2015  . Carpal tunnel syndrome 04/17/2015  . Hyperlipemia 06/02/2013  . Asthma, chronic 06/02/2013  . Depression 06/02/2013  . GERD 06/04/2009  . LEG PAIN, BILATERAL 06/04/2009  . DYSPNEA ON EXERTION 06/04/2009  . PERCUTANEOUS TRANSLUMINAL CORONARY ANGIOPLASTY, HX OF 06/04/2009   Gabriela Eves, PT, DPT 04/05/2018, 10:37 AM  [facility] [address] [place], [place], [address] Phone: [phone]   Fax:  [phone]  Name: [name] [name] MRN: [id number] Date of Birth: 11-24-1945

## 2018-04-07 ENCOUNTER — Encounter: Payer: Self-pay | Admitting: Physical Therapy

## 2018-04-07 ENCOUNTER — Ambulatory Visit: Payer: Medicare Other | Admitting: Physical Therapy

## 2018-04-07 DIAGNOSIS — M25511 Pain in right shoulder: Secondary | ICD-10-CM | POA: Diagnosis not present

## 2018-04-07 DIAGNOSIS — M25611 Stiffness of right shoulder, not elsewhere classified: Secondary | ICD-10-CM

## 2018-04-07 NOTE — Therapy (Signed)
[facility] [place], [place], [address] Phone: [phone]   Fax:  [phone]  Physical Therapy Treatment  Patient Details  Name: [name] [name] MRN: [id number] Date of Birth: 05-24-1946 Referring Provider: Erskine Emery PA-C   Encounter Date: 04/07/2018  PT End of Session - 04/07/18 1128    Visit Number  14    Number of Visits  16    Date for PT Re-Evaluation  05/18/18    Authorization Type  FOTO AT LEAST EVERY 5TH VISIT, 10TH VISIT PROGRESS NOTE AND KX MODIFIER AFTER THE 15 VISIT.    PT Start Time  0945    PT Stop Time  1033    PT Time Calculation (min)  48 min       Past Medical History:  Diagnosis Date  . Asthma, chronic 06/02/2013  . Carpal tunnel syndrome 04/17/2015   Bilateral  . Depression 06/02/2013  . DYSPNEA ON EXERTION 06/04/2009   Qualifier: Diagnosis of  By: Domenic Polite, MD, Phillips Hay   . GERD (gastroesophageal reflux disease)   . Hereditary and idiopathic peripheral neuropathy 04/17/2015  . Hyperlipemia 06/02/2013  . LEG PAIN, BILATERAL 06/04/2009   Qualifier: Diagnosis of  By: Domenic Polite, MD, Phillips Hay   . PERCUTANEOUS TRANSLUMINAL CORONARY ANGIOPLASTY, HX OF 06/04/2009   Qualifier: Diagnosis of  By: Domenic Polite, MD, Phillips Hay   . Prostate CA [facility])     Past Surgical History:  Procedure Laterality Date  . CARDIAC CATHETERIZATION  1997  . HIATAL HERNIA REPAIR    . LAPAROSCOPIC GASTRIC BANDING  2000   [facility]   . ROBOT ASSISTED LAPAROSCOPIC RADICAL PROSTATECTOMY      There were no vitals filed for this visit.  Subjective Assessment - 04/07/18 1129    Subjective  Patient states the combo has helped a great deal.    Patient Stated Goals  Improve mobility with decreased pain.    Currently in Pain?  Yes    Pain Score  2     Pain Location  Shoulder    Pain Orientation  Right    Pain Descriptors / Indicators  Aching                       [facility] Adult PT  Treatment/Exercise - 04/07/18 0001      Exercises   Exercises  Shoulder      Shoulder Exercises: ROM/Strengthening   UBE (Upper Arm Bike)  60 RPM's x 10 minutes.      Modalities   Modalities  Health visitor Stimulation Location  Right shoulder.    Electrical Stimulation Action  IFC    Electrical Stimulation Parameters  80-150 Hz x 20 minutes.    Electrical Stimulation Goals  Pain      Ultrasound   Ultrasound Location  Right anterior shoulder    Ultrasound Parameters  Combo e'stim/U/S at 1.50 W/CM2 x 8 minutes.    Ultrasound Goals  Pain      Vasopneumatic   Number Minutes Vasopneumatic   20 minutes    Vasopnuematic Location   -- Right shoulder.  Pillow between right elbow and thorax.    Vasopneumatic Pressure  Medium               PT Short Term Goals - 03/24/18 1000      PT SHORT TERM GOAL #1   Title  STG's=LTG's.  PT Long Term Goals - 03/24/18 1001      PT LONG TERM GOAL #1   Title  Ind with a HEP.    Time  8    Period  Weeks    Status  Achieved      PT LONG TERM GOAL #2   Title  Perform ADL's with pain not > 2-3/10.    Time  8    Period  Weeks    Status  On-going 9-1/50 with certain ADLs      PT LONG TERM GOAL #3   Title  Active right shoulder ER to 80 degrees+ to allow for easily donning/doffing of apparel.    Time  8    Period  Weeks    Status  On-going 65 AROM, 71 PROM            Plan - 04/07/18 1132    Clinical Impression Statement  After UBE exercise patient wanting combo, e'stim and vaso and no additional exercise.  He is very pleased with his progress.    PT Treatment/Interventions  ADLs/Self Care Home Management;Cryotherapy;Electrical Stimulation;Ultrasound;Moist Heat;Therapeutic activities;Therapeutic exercise;Patient/family education;Neuromuscular re-education;Manual techniques    PT Next Visit Plan  cont with e-stim/combo; modalities prn for pain relief.     Consulted and Agree with Plan of Care  Patient       Patient will benefit from skilled therapeutic intervention in order to improve the following deficits and impairments:  Decreased activity tolerance, Decreased range of motion, Pain, Decreased strength  Visit Diagnosis: Acute pain of right shoulder  Stiffness of right shoulder, not elsewhere classified     Problem List Patient Active Problem List   Diagnosis Date Noted  . H/O arthroscopy of shoulder 02/10/2018  . Unilateral primary osteoarthritis, left knee 11/25/2016  . Unilateral primary osteoarthritis, right knee 11/25/2016  . Essential hypertension 06/28/2015  . H/O prostate cancer 06/28/2015  . Hereditary and idiopathic peripheral neuropathy 04/17/2015  . Carpal tunnel syndrome 04/17/2015  . Hyperlipemia 06/02/2013  . Asthma, chronic 06/02/2013  . Depression 06/02/2013  . GERD 06/04/2009  . LEG PAIN, BILATERAL 06/04/2009  . DYSPNEA ON EXERTION 06/04/2009  . PERCUTANEOUS TRANSLUMINAL CORONARY ANGIOPLASTY, HX OF 06/04/2009    [name] [name], [name] [name]  MPT 04/07/2018, 12:14 PM  [facility] [address] [place], [place], [address] Phone: [phone]   Fax:  [phone]  Name: [name] [name] MRN: [id number] Date of Birth: 1946/05/08

## 2018-04-12 ENCOUNTER — Ambulatory Visit: Payer: Medicare Other | Admitting: Physical Therapy

## 2018-04-12 ENCOUNTER — Encounter: Payer: Self-pay | Admitting: Physical Therapy

## 2018-04-12 DIAGNOSIS — M25511 Pain in right shoulder: Secondary | ICD-10-CM | POA: Diagnosis not present

## 2018-04-12 DIAGNOSIS — M25611 Stiffness of right shoulder, not elsewhere classified: Secondary | ICD-10-CM

## 2018-04-12 NOTE — Therapy (Signed)
[facility] [place], [place], [address] Phone: [phone]   Fax:  [phone]  Physical Therapy Treatment  Patient Details  Name: LUQMAN PERRELLI MRN: [id number] Date of Birth: 1946-07-11 Referring Provider: Erskine Emery PA-C   Encounter Date: 04/12/2018  PT End of Session - 04/12/18 1221    Visit Number  15    Number of Visits  16    Date for PT Re-Evaluation  05/18/18    Authorization Type  FOTO AT LEAST EVERY 5TH VISIT, 10TH VISIT PROGRESS NOTE AND KX MODIFIER AFTER THE 15 VISIT.    PT Start Time  0945    PT Stop Time  1039    PT Time Calculation (min)  54 min    Activity Tolerance  Patient tolerated treatment well    Behavior During Therapy  WFL for tasks assessed/performed       Past Medical History:  Diagnosis Date  . Asthma, chronic 06/02/2013  . Carpal tunnel syndrome 04/17/2015   Bilateral  . Depression 06/02/2013  . DYSPNEA ON EXERTION 06/04/2009   Qualifier: Diagnosis of  By: Domenic Polite, MD, Phillips Hay   . GERD (gastroesophageal reflux disease)   . Hereditary and idiopathic peripheral neuropathy 04/17/2015  . Hyperlipemia 06/02/2013  . LEG PAIN, BILATERAL 06/04/2009   Qualifier: Diagnosis of  By: Domenic Polite, MD, Phillips Hay   . PERCUTANEOUS TRANSLUMINAL CORONARY ANGIOPLASTY, HX OF 06/04/2009   Qualifier: Diagnosis of  By: Domenic Polite, MD, Phillips Hay   . Prostate CA [facility])     Past Surgical History:  Procedure Laterality Date  . CARDIAC CATHETERIZATION  1997  . HIATAL HERNIA REPAIR    . LAPAROSCOPIC GASTRIC BANDING  2000   [facility]   . ROBOT ASSISTED LAPAROSCOPIC RADICAL PROSTATECTOMY      There were no vitals filed for this visit.  Subjective Assessment - 04/12/18 1218    Subjective  This therapy has really helped my shoulder.    Currently in Pain?  Yes    Pain Score  1     Pain Location  Shoulder    Pain Orientation  Right    Pain Descriptors / Indicators  Aching    Pain  Onset  1 to 4 weeks ago                       [facility] Adult PT Treatment/Exercise - 04/12/18 0001      Exercises   Exercises  Shoulder      Shoulder Exercises: ROM/Strengthening   UBE (Upper Arm Bike)  60 RPM's x 10 minutes.      Modalities   Modalities  Psychologist, educational Location  RT SHLD.    Electrical Stimulation Action  IFC    Electrical Stimulation Parameters  80-150 Hz x 20 minutes.    Electrical Stimulation Goals  Pain      Ultrasound   Ultrasound Location  -- RT ANT SHLD.    Ultrasound Parameters  Combo e'stim/U/S at 1.50 W/CM2 x 9 minutes.      Vasopneumatic   Number Minutes Vasopneumatic   20 minutes    Vasopnuematic Location   -- RT SHLD.    Vasopneumatic Pressure  Medium               PT Short Term Goals - 03/24/18 1000      PT SHORT TERM GOAL #1   Title  STG's=LTG's.  PT Long Term Goals - 03/24/18 1001      PT LONG TERM GOAL #1   Title  Ind with a HEP.    Time  8    Period  Weeks    Status  Achieved      PT LONG TERM GOAL #2   Title  Perform ADL's with pain not > 2-3/10.    Time  8    Period  Weeks    Status  On-going 5-0/72 with certain ADLs      PT LONG TERM GOAL #3   Title  Active right shoulder ER to 80 degrees+ to allow for easily donning/doffing of apparel.    Time  8    Period  Weeks    Status  On-going 65 AROM, 71 PROM            Plan - 04/12/18 1221    Clinical Impression Statement  No pain after treatment.  One remaining treatment.    PT Treatment/Interventions  ADLs/Self Care Home Management;Cryotherapy;Electrical Stimulation;Ultrasound;Moist Heat;Therapeutic activities;Therapeutic exercise;Patient/family education;Neuromuscular re-education;Manual techniques    PT Next Visit Plan  cont with e-stim/combo; modalities prn for pain relief.    Consulted and Agree with Plan of Care  Patient       Patient will benefit from  skilled therapeutic intervention in order to improve the following deficits and impairments:  Decreased activity tolerance, Decreased range of motion, Pain, Decreased strength  Visit Diagnosis: Acute pain of right shoulder  Stiffness of right shoulder, not elsewhere classified     Problem List Patient Active Problem List   Diagnosis Date Noted  . H/O arthroscopy of shoulder 02/10/2018  . Unilateral primary osteoarthritis, left knee 11/25/2016  . Unilateral primary osteoarthritis, right knee 11/25/2016  . Essential hypertension 06/28/2015  . H/O prostate cancer 06/28/2015  . Hereditary and idiopathic peripheral neuropathy 04/17/2015  . Carpal tunnel syndrome 04/17/2015  . Hyperlipemia 06/02/2013  . Asthma, chronic 06/02/2013  . Depression 06/02/2013  . GERD 06/04/2009  . LEG PAIN, BILATERAL 06/04/2009  . DYSPNEA ON EXERTION 06/04/2009  . PERCUTANEOUS TRANSLUMINAL CORONARY ANGIOPLASTY, HX OF 06/04/2009    Jhostin Epps, [place] MPT 04/12/2018, 12:23 PM  [facility] [address] [place], [place], [address] Phone: [phone]   Fax:  [phone]  Name: JERARD BAYS MRN: [id number] Date of Birth: April 20, 1946

## 2018-04-14 ENCOUNTER — Ambulatory Visit: Payer: Medicare Other | Attending: Physician Assistant | Admitting: Physical Therapy

## 2018-04-14 ENCOUNTER — Encounter: Payer: Self-pay | Admitting: Physical Therapy

## 2018-04-14 DIAGNOSIS — M25511 Pain in right shoulder: Secondary | ICD-10-CM

## 2018-04-14 DIAGNOSIS — M25611 Stiffness of right shoulder, not elsewhere classified: Secondary | ICD-10-CM | POA: Insufficient documentation

## 2018-04-14 NOTE — Therapy (Signed)
[facility] [place], [place], [address] Phone: [phone]   Fax:  [phone]  Physical Therapy Treatment/Discharge  Patient Details  Name: [name] [name] MRN: [id number] Date of Birth: 01/10/1946 Referring Provider: Erskine Emery PA-C   Encounter Date: 04/14/2018  PT End of Session - 04/14/18 0950    Visit Number  16    Number of Visits  16    Date for PT Re-Evaluation  05/18/18    Authorization Type  FOTO AT LEAST EVERY 5TH VISIT, 10TH VISIT PROGRESS NOTE AND KX MODIFIER AFTER THE 15 VISIT.    PT Start Time  0945    PT Stop Time  1038    PT Time Calculation (min)  53 min    Activity Tolerance  Patient tolerated treatment well    Behavior During Therapy  WFL for tasks assessed/performed       Past Medical History:  Diagnosis Date  . Asthma, chronic 06/02/2013  . Carpal tunnel syndrome 04/17/2015   Bilateral  . Depression 06/02/2013  . DYSPNEA ON EXERTION 06/04/2009   Qualifier: Diagnosis of  By: Domenic Polite, MD, Phillips Hay   . GERD (gastroesophageal reflux disease)   . Hereditary and idiopathic peripheral neuropathy 04/17/2015  . Hyperlipemia 06/02/2013  . LEG PAIN, BILATERAL 06/04/2009   Qualifier: Diagnosis of  By: Domenic Polite, MD, Phillips Hay   . PERCUTANEOUS TRANSLUMINAL CORONARY ANGIOPLASTY, HX OF 06/04/2009   Qualifier: Diagnosis of  By: Domenic Polite, MD, Phillips Hay   . Prostate CA [facility])     Past Surgical History:  Procedure Laterality Date  . CARDIAC CATHETERIZATION  1997  . HIATAL HERNIA REPAIR    . LAPAROSCOPIC GASTRIC BANDING  2000   [facility]   . ROBOT ASSISTED LAPAROSCOPIC RADICAL PROSTATECTOMY      There were no vitals filed for this visit.  Subjective Assessment - 04/14/18 1122    Subjective  Patient reports he has noted with improved function but pain still limiting intermittently.    Pertinent History  CTS; angioplasty.    Patient Stated Goals  Improve mobility with decreased  pain.    Currently in Pain?  No/denies         [facility] PT Assessment - 04/14/18 0001      Assessment   Medical Diagnosis  Acute right shoulder pain.                   [place] Adult PT Treatment/Exercise - 04/14/18 0001      Exercises   Exercises  Shoulder      Ultrasound   Ultrasound Location  right anterior and lateral shoulder    Ultrasound Parameters  Combo e-stim/US; 1 Mhz, 100% cont 1.5 w/cm2    Ultrasound Goals  Pain      Vasopneumatic   Number Minutes Vasopneumatic   15 minutes    Vasopnuematic Location   Shoulder    Vasopneumatic Pressure  Low    Vasopneumatic Temperature   34               PT Short Term Goals - 03/24/18 1000      PT SHORT TERM GOAL #1   Title  STG's=LTG's.        PT Long Term Goals - 04/14/18 1024      PT LONG TERM GOAL #1   Title  Ind with a HEP.    Time  8    Period  Weeks    Status  Achieved  PT LONG TERM GOAL #2   Title  Perform ADL's with pain not > 2-3/10.    Time  8    Period  Weeks    Status  Achieved      PT LONG TERM GOAL #3   Title  Active right shoulder ER to 80 degrees+ to allow for easily donning/doffing of apparel.    Time  8    Period  Weeks    Status  Not Met            Plan - 04/14/18 1023    Clinical Impression Statement  Patient was able to tolerate treatment well with no adverse affects to modalities. Patient FOTO 32% limitation. Patient educated importance of HEP to maintain progress gained in physical therapy. Patient reported understanding. Patient DC'd at this time.    Clinical Presentation  Stable    Clinical Decision Making  Low    Rehab Potential  Good    PT Frequency  2x / week    PT Duration  8 weeks    PT Treatment/Interventions  ADLs/Self Care Home Management;Cryotherapy;Electrical Stimulation;Ultrasound;Moist Heat;Therapeutic activities;Therapeutic exercise;Patient/family education;Neuromuscular re-education;Manual techniques    PT Next Visit Plan  DC.    Consulted  and Agree with Plan of Care  Patient       Patient will benefit from skilled therapeutic intervention in order to improve the following deficits and impairments:  Decreased activity tolerance, Decreased range of motion, Pain, Decreased strength  Visit Diagnosis: Acute pain of right shoulder  Stiffness of right shoulder, not elsewhere classified     Problem List Patient Active Problem List   Diagnosis Date Noted  . H/O arthroscopy of shoulder 02/10/2018  . Unilateral primary osteoarthritis, left knee 11/25/2016  . Unilateral primary osteoarthritis, right knee 11/25/2016  . Essential hypertension 06/28/2015  . H/O prostate cancer 06/28/2015  . Hereditary and idiopathic peripheral neuropathy 04/17/2015  . Carpal tunnel syndrome 04/17/2015  . Hyperlipemia 06/02/2013  . Asthma, chronic 06/02/2013  . Depression 06/02/2013  . GERD 06/04/2009  . LEG PAIN, BILATERAL 06/04/2009  . DYSPNEA ON EXERTION 06/04/2009  . PERCUTANEOUS TRANSLUMINAL CORONARY ANGIOPLASTY, HX OF 06/04/2009    PHYSICAL THERAPY DISCHARGE SUMMARY  Visits from Start of Care: 16  Current functional level related to goals / functional outcomes: See above   Remaining deficits: See goals.   Education / Equipment: HEP Plan: Patient agrees to discharge.  Patient goals were partially met. Patient is being discharged due to being pleased with the current functional level.  ?????      Gabriela Eves, PT, DPT 04/14/2018, 11:27 AM  [facility] [address] [place], [place], [address] Phone: [phone]   Fax:  [phone]  Name: [name] [name] MRN: [id number] Date of Birth: [birth year]

## 2018-04-20 ENCOUNTER — Ambulatory Visit (INDEPENDENT_AMBULATORY_CARE_PROVIDER_SITE_OTHER): Payer: Self-pay | Admitting: Physical Medicine and Rehabilitation

## 2018-04-25 ENCOUNTER — Telehealth: Payer: Self-pay

## 2018-04-25 NOTE — Telephone Encounter (Signed)
Pt would like a referral for pain in joints and muscle spasms Wife sees Dr. Lenna Gilford or Amil Amen and would like to be referred to him

## 2018-04-26 NOTE — Telephone Encounter (Signed)
Spoke with patient.  Patient states that he would like to see Dr. Lenna Gilford at [facility] rheumatology for muscle pain and joint pain.  Patient states that he does not think he needs to see ortho and does not want to go to pain clinic.

## 2018-04-26 NOTE — Telephone Encounter (Signed)
Does he mean ortho or pain management

## 2018-04-26 NOTE — Telephone Encounter (Signed)
I have not seen patient since 03/2017 he needs toi be seen in order for [place] to do referral

## 2018-04-27 NOTE — Telephone Encounter (Signed)
appt made

## 2018-04-28 ENCOUNTER — Encounter: Payer: Self-pay | Admitting: Nurse Practitioner

## 2018-04-28 ENCOUNTER — Ambulatory Visit (INDEPENDENT_AMBULATORY_CARE_PROVIDER_SITE_OTHER): Payer: Medicare Other | Admitting: Nurse Practitioner

## 2018-04-28 VITALS — BP 121/66 | HR 52 | Temp 97.0°F | Ht 74.0 in | Wt 228.0 lb

## 2018-04-28 DIAGNOSIS — K219 Gastro-esophageal reflux disease without esophagitis: Secondary | ICD-10-CM | POA: Diagnosis not present

## 2018-04-28 DIAGNOSIS — G609 Hereditary and idiopathic neuropathy, unspecified: Secondary | ICD-10-CM | POA: Diagnosis not present

## 2018-04-28 DIAGNOSIS — J4520 Mild intermittent asthma, uncomplicated: Secondary | ICD-10-CM | POA: Diagnosis not present

## 2018-04-28 DIAGNOSIS — M791 Myalgia, unspecified site: Secondary | ICD-10-CM

## 2018-04-28 DIAGNOSIS — I10 Essential (primary) hypertension: Secondary | ICD-10-CM

## 2018-04-28 DIAGNOSIS — E782 Mixed hyperlipidemia: Secondary | ICD-10-CM | POA: Diagnosis not present

## 2018-04-28 DIAGNOSIS — Z6829 Body mass index (BMI) 29.0-29.9, adult: Secondary | ICD-10-CM

## 2018-04-28 DIAGNOSIS — Z1159 Encounter for screening for other viral diseases: Secondary | ICD-10-CM | POA: Diagnosis not present

## 2018-04-28 DIAGNOSIS — Z125 Encounter for screening for malignant neoplasm of prostate: Secondary | ICD-10-CM

## 2018-04-28 DIAGNOSIS — F3342 Major depressive disorder, recurrent, in full remission: Secondary | ICD-10-CM | POA: Diagnosis not present

## 2018-04-28 MED ORDER — ATENOLOL 25 MG PO TABS: 25.0000 mg | ORAL_TABLET | Freq: Every day | ORAL | 2 refills | Status: DC

## 2018-04-28 MED ORDER — FENOFIBRATE 145 MG PO TABS: ORAL_TABLET | ORAL | 2 refills | Status: DC

## 2018-04-28 MED ORDER — AMLODIPINE BESYLATE 5 MG PO TABS: 5.0000 mg | ORAL_TABLET | Freq: Every day | ORAL | 2 refills | Status: DC

## 2018-04-28 MED ORDER — MONTELUKAST SODIUM 10 MG PO TABS: 10.0000 mg | ORAL_TABLET | Freq: Every day | ORAL | 1 refills | Status: DC

## 2018-04-28 MED ORDER — CITALOPRAM HYDROBROMIDE 20 MG PO TABS: 20.0000 mg | ORAL_TABLET | Freq: Every day | ORAL | 0 refills | Status: DC

## 2018-04-28 MED ORDER — BUDESONIDE-FORMOTEROL FUMARATE 160-4.5 MCG/ACT IN AERO: 2.0000 | INHALATION_SPRAY | Freq: Two times a day (BID) | RESPIRATORY_TRACT | 3 refills | Status: DC

## 2018-04-28 MED ORDER — ROSUVASTATIN CALCIUM 5 MG PO TABS: 5.0000 mg | ORAL_TABLET | Freq: Every day | ORAL | 3 refills | Status: DC

## 2018-04-28 NOTE — Progress Notes (Signed)
Subjective:    Patient ID: [name] [name], male    DOB: [birth year], 72 y.o.   MRN: [id number]   Chief Complaint: Wants referral to rheumatologist and medical management of chronic issues. * patient has not been seen in over a year in this office.   HPI:  1. Essential hypertension  No c/o chest pain, sob or headache. Does not check blood pressure at home. BP Readings from Last 3 Encounters:  04/28/18 121/66  01/07/18 121/65  11/11/17 130/68     2. Mild intermittent chronic asthma without complication  No breathing problems. Uses symbicort daily- has not needed albuterol as of late.  3. Gastroesophageal reflux disease, esophagitis presence not specified  Currently not on any rx meds. Not having any symptoms.  4. Hereditary and idiopathic peripheral neuropathy  Occasional burning and tingling in feet. Currently on no rx meds for this  5. Mixed hyperlipidemia  Trying to watch diet. tries to exercise some.  6. Recurrent major depressive disorder, in full remission [facility])  Patient is still on celexa.  Depression screen [facility] 2/9 04/28/2018 01/07/2018 10/18/2017  Decreased Interest 0 0 0  Down, Depressed, Hopeless 0 0 0  PHQ - 2 Score 0 0 0  Altered sleeping - - -  Tired, decreased energy - - -  Change in appetite - - -  Feeling bad or failure about yourself  - - -  Trouble concentrating - - -  Moving slowly or fidgety/restless - - -  Suicidal thoughts - - -  PHQ-9 Score - - -     7.      BMI 29.0-29.9           Has lost 30lbs over the last year  Outpatient Encounter Medications as of 04/28/2018  Medication Sig  . amLODipine (NORVASC) 5 MG tablet Take 1 tablet (5 mg total) by mouth daily.  Marland Kitchen aspirin EC 81 MG tablet Take 81 mg by mouth daily.  Marland Kitchen atenolol (TENORMIN) 25 MG tablet Take 1 tablet (25 mg total) by mouth daily.  . budesonide-formoterol (SYMBICORT) 160-4.5 MCG/ACT inhaler Inhale 2 puffs into the lungs 2 (two) times daily.  . cetirizine (ZYRTEC) 10 MG tablet Take 10 mg  by mouth daily.  . cholecalciferol (VITAMIN D) 1000 UNITS tablet Take 1,000 Units by mouth daily.  . citalopram (CELEXA) 20 MG tablet TAKE 1 TABLET DAILY  . cyanocobalamin (,VITAMIN B-12,) 1000 MCG/ML injection INJECT 1ML EVERY MONTH  . fenofibrate (TRICOR) 145 MG tablet TAKE (1) TABLET DAILY BEFORE BREAKFAST.  Marland Kitchen HYDROcodone-acetaminophen (NORCO/VICODIN) 5-325 MG tablet Take 1-2 tablets by mouth every 6 (six) hours as needed for moderate pain.  Marland Kitchen loperamide (IMODIUM) 2 MG capsule Take 2 mg by mouth daily.  . methocarbamol (ROBAXIN) 500 MG tablet Take 1 tablet (500 mg total) by mouth 3 (three) times daily.  . montelukast (SINGULAIR) 10 MG tablet TAKE 1 TABLET DAILY  . Multiple Vitamins-Minerals (MULTIVITAMIN WITH MINERALS) tablet Take 1 tablet by mouth daily.  . naproxen (NAPROSYN) 500 MG tablet TAKE  (1)  TABLET TWICE A DAY WITH FOOD.  Marland Kitchen sildenafil (REVATIO) 20 MG tablet Take 20 mg by mouth 3 (three) times daily as needed.  . rosuvastatin (CRESTOR) 5 MG tablet Take 1 tablet (5 mg total) by mouth daily.       New complaints: Has chronic body pain- thinks muscular- would like to see rheumatoogy  Social history: Lives with wife of over 39 years- they are both retired.  Review of Systems  Constitutional:  Negative for activity change and appetite change.  HENT: Negative.   Eyes: Negative for pain.  Respiratory: Negative for shortness of breath.   Cardiovascular: Negative for chest pain, palpitations and leg swelling.  Gastrointestinal: Negative for abdominal pain.  Endocrine: Negative for polydipsia.  Genitourinary: Negative.   Skin: Negative for rash.  Neurological: Negative for dizziness, weakness and headaches.  Hematological: Does not bruise/bleed easily.  Psychiatric/Behavioral: Negative.   All other systems reviewed and are negative.      Objective:   Physical Exam  Constitutional: He is oriented to person, place, and time. He appears well-developed and well-nourished.    HENT:  Head: Normocephalic.  Nose: Nose normal.  Mouth/Throat: Oropharynx is clear and moist.  Eyes: Pupils are equal, round, and reactive to light. EOM are normal.  Neck: Normal range of motion and phonation normal. Neck supple. No JVD present. Carotid bruit is not present. No thyroid mass and no thyromegaly present.  Cardiovascular: Normal rate and regular rhythm.  Pulmonary/Chest: Effort normal and breath sounds normal. No respiratory distress.  Abdominal: Soft. Normal appearance, normal aorta and bowel sounds are normal. There is no tenderness.  Musculoskeletal: Normal range of motion.  Lymphadenopathy:    He has no cervical adenopathy.  Neurological: He is alert and oriented to person, place, and time.  Skin: Skin is warm and dry.  Psychiatric: He has a normal mood and affect. His behavior is normal. Judgment and thought content normal.   BP 121/66   Pulse (!) 52   Temp (!) 97 F (36.1 C) (Oral)   Ht _0  (1.88 m)   Wt 228 lb (103.4 kg)   BMI 29.27 kg/m          Assessment & Plan:  CHAZZ PHILSON comes in today with chief complaint of Wants referral to rheumatologist   Diagnosis and orders addressed:  1. Essential hypertension Low sodium diet - CMP14+EGFR - amLODipine (NORVASC) 5 MG tablet; Take 1 tablet (5 mg total) by mouth daily.  Dispense: 90 tablet; Refill: 2 - atenolol (TENORMIN) 25 MG tablet; Take 1 tablet (25 mg total) by mouth daily.  Dispense: 90 tablet; Refill: 2 - budesonide-formoterol (SYMBICORT) 160-4.5 MCG/ACT inhaler; Inhale 2 puffs into the lungs 2 (two) times daily.  Dispense: 3 Inhaler; Refill: 3  2. Mild intermittent chronic asthma without complication Avoid being out in heat - montelukast (SINGULAIR) 10 MG tablet; Take 1 tablet (10 mg total) by mouth daily.  Dispense: 90 tablet; Refill: 1  3. Gastroesophageal reflux disease, esophagitis presence not specified Avoid spicy foods Do not eat 2 hours prior to bedtime  4. Hereditary and  idiopathic peripheral neuropathy Do not go bare footed  5. Mixed hyperlipidemia Low fat diet - Lipid panel - fenofibrate (TRICOR) 145 MG tablet; TAKE (1) TABLET DAILY BEFORE BREAKFAST.  Dispense: 90 tablet; Refill: 2 - rosuvastatin (CRESTOR) 5 MG tablet; Take 1 tablet (5 mg total) by mouth daily.  Dispense: 90 tablet; Refill: 3  6. Recurrent major depressive disorder, in full remission ([place]) Stress management - citalopram (CELEXA) 20 MG tablet; Take 1 tablet (20 mg total) by mouth daily.  Dispense: 90 tablet; Refill: 0  7. BMI 29.0-29.9,adult Discussed diet and exercise for person with BMI >25 Will recheck weight in 3-6 months  8. Generalized muscle ache - Ambulatory referral to Rheumatology  9. Need for hepatitis C screening test - Hepatitis C antibody  10. Prostate cancer screening - PR PSA, TOTAL SCREENING   Labs pending Health Maintenance reviewed Diet  and exercise encouraged  Follow up plan: 6 months   Mary-Margaret Hassell Done, FNP

## 2018-04-28 NOTE — Patient Instructions (Signed)
Stress and Stress Management Stress is a normal reaction to life events. It is what you feel when life demands more than you are used to or more than you can handle. Some stress can be useful. For example, the stress reaction can help you catch the last bus of the day, study for a test, or meet a deadline at work. But stress that occurs too often or for too long can cause problems. It can affect your emotional health and interfere with relationships and normal daily activities. Too much stress can weaken your immune system and increase your risk for physical illness. If you already have a medical problem, stress can make it worse. What are the causes? All sorts of life events may cause stress. An event that causes stress for one person may not be stressful for another person. Major life events commonly cause stress. These may be positive or negative. Examples include losing your job, moving into a new home, getting married, having a baby, or losing a loved one. Less obvious life events may also cause stress, especially if they occur day after day or in combination. Examples include working long hours, driving in traffic, caring for children, being in debt, or being in a difficult relationship. What are the signs or symptoms? Stress may cause emotional symptoms including, the following:  Anxiety. This is feeling worried, afraid, on edge, overwhelmed, or out of control.  Anger. This is feeling irritated or impatient.  Depression. This is feeling sad, down, helpless, or guilty.  Difficulty focusing, remembering, or making decisions.  Stress may cause physical symptoms, including the following:  Aches and pains. These may affect your head, neck, back, stomach, or other areas of your body.  Tight muscles or clenched jaw.  Low energy or trouble sleeping.  Stress may cause unhealthy behaviors, including the following:  Eating to feel better (overeating) or skipping meals.  Sleeping too little,  too much, or both.  Working too much or putting off tasks (procrastination).  Smoking, drinking alcohol, or using drugs to feel better.  How is this diagnosed? Stress is diagnosed through an assessment by your health care provider. Your health care provider will ask questions about your symptoms and any stressful life events.Your health care provider will also ask about your medical history and may order blood tests or other tests. Certain medical conditions and medicine can cause physical symptoms similar to stress. Mental illness can cause emotional symptoms and unhealthy behaviors similar to stress. Your health care provider may refer you to a mental health professional for further evaluation. How is this treated? Stress management is the recommended treatment for stress.The goals of stress management are reducing stressful life events and coping with stress in healthy ways. Techniques for reducing stressful life events include the following:  Stress identification. Self-monitor for stress and identify what causes stress for you. These skills may help you to avoid some stressful events.  Time management. Set your priorities, keep a calendar of events, and learn to say "no." These tools can help you avoid making too many commitments.  Techniques for coping with stress include the following:  Rethinking the problem. Try to think realistically about stressful events rather than ignoring them or overreacting. Try to find the positives in a stressful situation rather than focusing on the negatives.  Exercise. Physical exercise can release both physical and emotional tension. The key is to find a form of exercise you enjoy and do it regularly.  Relaxation techniques. These relax the body and  mind. Examples include yoga, meditation, tai chi, biofeedback, deep breathing, progressive muscle relaxation, listening to music, being out in nature, journaling, and other hobbies. Again, the key is to find  one or more that you enjoy and can do regularly.  Healthy lifestyle. Eat a balanced diet, get plenty of sleep, and do not smoke. Avoid using alcohol or drugs to relax.  Strong support network. Spend time with family, friends, or other people you enjoy being around.Express your feelings and talk things over with someone you trust.  Counseling or talktherapy with a mental health professional may be helpful if you are having difficulty managing stress on your own. Medicine is typically not recommended for the treatment of stress.Talk to your health care provider if you think you need medicine for symptoms of stress. Follow these instructions at home:  Keep all follow-up visits as directed by your health care provider.  Take all medicines as directed by your health care provider. Contact a health care provider if:  Your symptoms get worse or you start having new symptoms.  You feel overwhelmed by your problems and can no longer manage them on your own. Get help right away if:  You feel like hurting yourself or someone else. This information is not intended to replace advice given to you by your health care provider. Make sure you discuss any questions you have with your health care provider. Document Released: 02/24/2001 Document Revised: 02/06/2016 Document Reviewed: 04/25/2013 Elsevier Interactive Patient Education  2017 [employer]  

## 2018-04-29 LAB — HEPATITIS C ANTIBODY: Hep C Virus Ab: <0.1 s/co ratio (ref 0.0–0.9)

## 2018-04-29 LAB — CMP14+EGFR
ALT: 18 IU/L (ref 0–44)
AST: 21 IU/L (ref 0–40)
Albumin/Globulin Ratio: 2 (ref 1.2–2.2)
Albumin: 4.8 g/dL (ref 3.5–4.8)
Alkaline Phosphatase: 48 IU/L (ref 39–117)
BUN/Creatinine Ratio: 14 (ref 10–24)
BUN: 21 mg/dL (ref 8–27)
Bilirubin Total: 0.3 mg/dL (ref 0.0–1.2)
CO2: 26 mmol/L (ref 20–29)
Calcium: 10 mg/dL (ref 8.6–10.2)
Chloride: 98 mmol/L (ref 96–106)
Creatinine, Ser: 1.49 mg/dL — ABNORMAL HIGH (ref 0.76–1.27)
GFR calc Af Amer: 54 mL/min/{1.73_m2} — ABNORMAL LOW (ref >59)
GFR calc non Af Amer: 47 mL/min/{1.73_m2} — ABNORMAL LOW (ref >59)
Globulin, Total: 2.4 g/dL (ref 1.5–4.5)
Glucose: 100 mg/dL — ABNORMAL HIGH (ref 65–99)
Potassium: 5.2 mmol/L (ref 3.5–5.2)
Sodium: 137 mmol/L (ref 134–144)
Total Protein: 7.2 g/dL (ref 6.0–8.5)

## 2018-04-29 LAB — LIPID PANEL
Chol/HDL Ratio: 2.5 ratio (ref 0.0–5.0)
Cholesterol, Total: 123 mg/dL (ref 100–199)
HDL: 50 mg/dL (ref >39)
LDL Calculated: 54 mg/dL (ref 0–99)
Triglycerides: 97 mg/dL (ref 0–149)
VLDL Cholesterol Cal: 19 mg/dL (ref 5–40)

## 2018-05-02 ENCOUNTER — Other Ambulatory Visit: Payer: Self-pay | Admitting: Nurse Practitioner

## 2018-05-02 DIAGNOSIS — F3342 Major depressive disorder, recurrent, in full remission: Secondary | ICD-10-CM

## 2018-05-10 DIAGNOSIS — L579 Skin changes due to chronic exposure to nonionizing radiation, unspecified: Secondary | ICD-10-CM | POA: Diagnosis not present

## 2018-05-10 DIAGNOSIS — D1801 Hemangioma of skin and subcutaneous tissue: Secondary | ICD-10-CM | POA: Diagnosis not present

## 2018-05-10 DIAGNOSIS — Z85828 Personal history of other malignant neoplasm of skin: Secondary | ICD-10-CM | POA: Diagnosis not present

## 2018-05-10 DIAGNOSIS — L821 Other seborrheic keratosis: Secondary | ICD-10-CM | POA: Diagnosis not present

## 2018-05-10 DIAGNOSIS — D225 Melanocytic nevi of trunk: Secondary | ICD-10-CM | POA: Diagnosis not present

## 2018-05-10 DIAGNOSIS — L814 Other melanin hyperpigmentation: Secondary | ICD-10-CM | POA: Diagnosis not present

## 2018-06-06 ENCOUNTER — Telehealth (INDEPENDENT_AMBULATORY_CARE_PROVIDER_SITE_OTHER): Payer: Self-pay | Admitting: Orthopaedic Surgery

## 2018-06-06 NOTE — Telephone Encounter (Signed)
LMOM for patient to call back with message on what exactly she was needing

## 2018-06-06 NOTE — Telephone Encounter (Signed)
Patients wife Lelon Frohlich called and would like a call back from you about knee injections for her husband? Ann's # [phone]

## 2018-06-30 DIAGNOSIS — Z6829 Body mass index (BMI) 29.0-29.9, adult: Secondary | ICD-10-CM | POA: Diagnosis not present

## 2018-06-30 DIAGNOSIS — M2550 Pain in unspecified joint: Secondary | ICD-10-CM | POA: Diagnosis not present

## 2018-06-30 DIAGNOSIS — E663 Overweight: Secondary | ICD-10-CM | POA: Diagnosis not present

## 2018-06-30 DIAGNOSIS — M150 Primary generalized (osteo)arthritis: Secondary | ICD-10-CM | POA: Diagnosis not present

## 2018-06-30 DIAGNOSIS — M545 Low back pain: Secondary | ICD-10-CM | POA: Diagnosis not present

## 2018-07-07 ENCOUNTER — Ambulatory Visit (INDEPENDENT_AMBULATORY_CARE_PROVIDER_SITE_OTHER): Payer: Medicare Other | Admitting: Physical Medicine and Rehabilitation

## 2018-07-07 ENCOUNTER — Ambulatory Visit (INDEPENDENT_AMBULATORY_CARE_PROVIDER_SITE_OTHER): Payer: Medicare Other

## 2018-07-07 ENCOUNTER — Encounter (INDEPENDENT_AMBULATORY_CARE_PROVIDER_SITE_OTHER): Payer: Self-pay | Admitting: Physical Medicine and Rehabilitation

## 2018-07-07 ENCOUNTER — Encounter

## 2018-07-07 VITALS — BP 138/66 | HR 57 | Ht 75.0 in | Wt 228.0 lb

## 2018-07-07 DIAGNOSIS — M5442 Lumbago with sciatica, left side: Secondary | ICD-10-CM | POA: Diagnosis not present

## 2018-07-07 DIAGNOSIS — M47816 Spondylosis without myelopathy or radiculopathy, lumbar region: Secondary | ICD-10-CM

## 2018-07-07 DIAGNOSIS — M5441 Lumbago with sciatica, right side: Secondary | ICD-10-CM

## 2018-07-07 DIAGNOSIS — G8929 Other chronic pain: Secondary | ICD-10-CM | POA: Diagnosis not present

## 2018-07-07 DIAGNOSIS — G609 Hereditary and idiopathic neuropathy, unspecified: Secondary | ICD-10-CM

## 2018-07-07 NOTE — Progress Notes (Signed)
 .  Numeric Pain Rating Scale and Functional Assessment Average Pain 8 Pain Right Now 4 My pain is constant, sharp, dull and aching Pain is worse with: some activites Pain improves with: rest and medication   In the last MONTH (on 0-10 scale) has pain interfered with the following?  1. General activity like being  able to carry out your everyday physical activities such as walking, climbing stairs, carrying groceries, or moving a chair?  Rating(6)  2. Relation with others like being able to carry out your usual social activities and roles such as  activities at home, at work and in your community. Rating(6)  3. Enjoyment of life such that you have  been bothered by emotional problems such as feeling anxious, depressed or irritable?  Rating(6)

## 2018-07-08 ENCOUNTER — Encounter (INDEPENDENT_AMBULATORY_CARE_PROVIDER_SITE_OTHER): Payer: Self-pay | Admitting: Physical Medicine and Rehabilitation

## 2018-07-08 NOTE — Progress Notes (Signed)
[name] [name] - 72 y.o. male MRN [id number]  Date of birth: Apr 15, 1946  Office Visit Note: Visit Date: 07/07/2018 PCP: Chevis Pretty, FNP Referred by: Hassell Done, Mary-Margaret, *  Subjective: Chief Complaint  Patient presents with  . Lower Back - Pain  . Right Thigh - Pain  . Left Thigh - Pain  . Left Foot - Numbness, Tingling  . Right Foot - Numbness, Tingling   HPI: [name] [name] is a 72 y.o. male who comes in today For evaluation of his chronic worsening axial low back pain.  He has been followed by Dr. Ninfa Linden for bilateral knee osteoarthritis as well as rotator cuff tear more recently and in the past was followed by Dr. Eunice Blase.  Last time I saw the patient was in 2014 we completed facet joint blocks which were very beneficial at the time.  He has been in physical therapy on numerous occasions for his back and knees and now most recently for a shoulder.  He does take some hydrocodone for back pain.  He rates his pain as an 8 out of 10.  He reports the pain is constant sharp sometimes dull and aching.  He gets worsening going from sit to stand and standing and better at rest and sitting.  Some relief with medication.  He reports 3 months of progressive worsening back pain without any trauma.  He does have numbness and tingling in both feet but carries a diagnosis of hereditary polyneuropathy.  He is seeing Dr. Floyde Parkins at [facility] neurology in the past for this.  He does get some radiating pattern to the upper outer lateral thighs.  No real frank radicular symptoms down the legs.  MRI from 2013 is reviewed and does show facet arthropathy.  Has not had x-rays in a while so we did complete AP and lateral x-rays today and those are dictated below.  He has had no focal weakness or bowel bladder changes or other red flag complaints.  Review of Systems  Constitutional: Negative for chills, fever, malaise/fatigue and weight loss.  HENT: Negative for hearing loss and sinus pain.    Eyes: Negative for blurred vision, double vision and photophobia.  Respiratory: Negative for cough and shortness of breath.   Cardiovascular: Negative for chest pain, palpitations and leg swelling.  Gastrointestinal: Negative for abdominal pain, nausea and vomiting.  Genitourinary: Negative for flank pain.  Musculoskeletal: Positive for back pain and joint pain. Negative for myalgias.  Skin: Negative for itching and rash.  Neurological: Positive for tingling. Negative for tremors, focal weakness and weakness.  Endo/Heme/Allergies: Negative.   Psychiatric/Behavioral: Negative for depression.  All other systems reviewed and are negative.  Otherwise per HPI.  Assessment & Plan: Visit Diagnoses:  1. Spondylosis without myelopathy or radiculopathy, lumbar region   2. Chronic bilateral low back pain with bilateral sciatica   3. Hereditary and idiopathic peripheral neuropathy     Plan: Findings:  Chronic history of low back pain complicated by history of polyneuropathy so he does get some numbness in the feet bilaterally.  Symptoms today correlate more with facet arthropathy and axial back pain.  MRI from 2013 corresponds with this and plain film x-rays as well today.  While he could have developed some central stenosis in the last 6 years I doubt that is the case and he has no red flag symptoms to warrant MRI review at this point.  Exam is consistent with facet joint pain and facet joint loading.  We did discuss  activity modification and strengthening.  I do think he would be a good candidate for diagnostic facet joint blocks once again at L4-5 and if he gets relief we could look at potentially radiofrequency ablation.  If he does not get much relief we could decide at that point either one time epidural injection versus MRI of the lumbar spine.  I would favor updating MRI of the lumbar spine at that point.  He has had 3 months of progressive worsening pain to the point now it is affecting his  functionality.    Meds & Orders: No orders of the defined types were placed in this encounter.   Orders Placed This Encounter  Procedures  . XR Lumbar Spine 2-3 Views    Follow-up: Return for Bilateral L4-5 facet blocks.   Procedures: No procedures performed  No notes on file   Clinical History: Lumbar Spine SOS MRI 12/28/2011  L4-5 facet arthropathy, mild bulging, degenerative changes   He reports that he quit smoking about 22 years ago. His smoking use included cigarettes. He has a 20.00 pack-year smoking history. He has never used smokeless tobacco. No results for input(s): HGBA1C, LABURIC in the last 8760 hours.  Objective:  VS:  HT:6\' 3"  (190.5 cm)   WT:228 lb (103.4 kg)  BMI:28.5    BP:138/66  HR:(!) 57bpm  TEMP: ( )  RESP:97 % Physical Exam  Constitutional: He is oriented to person, place, and time. He appears well-developed and well-nourished. No distress.  HENT:  Head: Normocephalic and atraumatic.  Eyes: Pupils are equal, round, and reactive to light. Conjunctivae are normal.  Neck: Normal range of motion. Neck supple.  Cardiovascular: Regular rhythm and intact distal pulses.  Pulmonary/Chest: Effort normal. No respiratory distress.  Musculoskeletal:  Patient ambulates without aid with some balance difficulty.  Somewhat wide-based gait.  Does stand with a forward flexed lumbar spine.  He has pain with facet joint loading and extension which is concordant with his low back pain.  He has some taut bands in the quadratus lumborum.  No pain over the PSIS or greater trochanter.  He has good distal strength and no clonus.  Neurological: He is alert and oriented to person, place, and time. He exhibits normal muscle tone. Coordination normal.  Skin: Skin is warm and dry. No rash noted. No erythema.  Psychiatric: He has a normal mood and affect.  Nursing note and vitals reviewed.   Ortho Exam Imaging: Xr Lumbar Spine 2-3 Views  Result Date: 07/08/2018 Fairly normal AP  alignment but lateral does show some flattening of the normal lordotic curve.  He has bilateral degenerative joint disease of both hips moderate.  He has degenerative disc height loss of L5-S1 with by foraminal narrowing.  He has significant facet arthropathy at L4-5 bilaterally somewhat right more than left.  He has some spurring anteriorly at L1-2 with perhaps small bit of retrolisthesis.   Past Medical/Family/Surgical/Social History: Medications & Allergies reviewed per EMR, new medications updated. Patient Active Problem List   Diagnosis Date Noted  . H/O arthroscopy of shoulder 02/10/2018  . Unilateral primary osteoarthritis, left knee 11/25/2016  . Unilateral primary osteoarthritis, right knee 11/25/2016  . Essential hypertension 06/28/2015  . H/O prostate cancer 06/28/2015  . Hereditary and idiopathic peripheral neuropathy 04/17/2015  . Carpal tunnel syndrome 04/17/2015  . Hyperlipemia 06/02/2013  . Asthma, chronic 06/02/2013  . Depression 06/02/2013  . GERD 06/04/2009  . LEG PAIN, BILATERAL 06/04/2009  . PERCUTANEOUS TRANSLUMINAL CORONARY ANGIOPLASTY, HX OF 06/04/2009  Past Medical History:  Diagnosis Date  . Asthma, chronic 06/02/2013  . Carpal tunnel syndrome 04/17/2015   Bilateral  . Depression 06/02/2013  . DYSPNEA ON EXERTION 06/04/2009   Qualifier: Diagnosis of  By: Domenic Polite, MD, Phillips Hay   . GERD (gastroesophageal reflux disease)   . Hereditary and idiopathic peripheral neuropathy 04/17/2015  . Hyperlipemia 06/02/2013  . LEG PAIN, BILATERAL 06/04/2009   Qualifier: Diagnosis of  By: Domenic Polite, MD, Phillips Hay   . PERCUTANEOUS TRANSLUMINAL CORONARY ANGIOPLASTY, HX OF 06/04/2009   Qualifier: Diagnosis of  By: Domenic Polite, MD, Phillips Hay   . Prostate CA [facility])    Family History  Problem Relation Age of Onset  . Heart disease Unknown        Early Cardiac disease for uncle is their 71's as well as deceased.  Marland Kitchen Heart failure Mother   . Dementia  Mother 17  . Cancer Father 59       testicular, lung  . Esophageal cancer Brother    Past Surgical History:  Procedure Laterality Date  . CARDIAC CATHETERIZATION  1997  . HIATAL HERNIA REPAIR    . LAPAROSCOPIC GASTRIC BANDING  2000   [facility]   . ROBOT ASSISTED LAPAROSCOPIC RADICAL PROSTATECTOMY     Social History   Occupational History  . Occupation: Retired    Comment: Nature conservation officer  Tobacco Use  . Smoking status: Former Smoker    Packs/day: 1.00    Years: 20.00    Pack years: 20.00    Types: Cigarettes    Last attempt to quit: 01/19/1996    Years since quitting: 22.4  . Smokeless tobacco: Never Used  Substance and Sexual Activity  . Alcohol use: Yes    Alcohol/week: 2.0 standard drinks    Types: 2 Cans of beer per week  . Drug use: No  . Sexual activity: Yes

## 2018-07-14 ENCOUNTER — Ambulatory Visit (INDEPENDENT_AMBULATORY_CARE_PROVIDER_SITE_OTHER): Payer: Medicare Other

## 2018-07-14 DIAGNOSIS — Z23 Encounter for immunization: Secondary | ICD-10-CM

## 2018-07-18 ENCOUNTER — Encounter (INDEPENDENT_AMBULATORY_CARE_PROVIDER_SITE_OTHER): Payer: Self-pay | Admitting: Physical Medicine and Rehabilitation

## 2018-07-18 ENCOUNTER — Ambulatory Visit (INDEPENDENT_AMBULATORY_CARE_PROVIDER_SITE_OTHER): Payer: Medicare Other | Admitting: Physical Medicine and Rehabilitation

## 2018-07-18 ENCOUNTER — Telehealth (INDEPENDENT_AMBULATORY_CARE_PROVIDER_SITE_OTHER): Payer: Self-pay | Admitting: Physician Assistant

## 2018-07-18 ENCOUNTER — Ambulatory Visit (INDEPENDENT_AMBULATORY_CARE_PROVIDER_SITE_OTHER): Payer: Self-pay

## 2018-07-18 VITALS — BP 143/72 | HR 51 | Temp 97.8°F

## 2018-07-18 DIAGNOSIS — M47816 Spondylosis without myelopathy or radiculopathy, lumbar region: Secondary | ICD-10-CM | POA: Diagnosis not present

## 2018-07-18 MED ORDER — METHYLPREDNISOLONE ACETATE 80 MG/ML IJ SUSP
80.0000 mg | Freq: Once | INTRAMUSCULAR | Status: AC
  Administered 2018-07-18: 80 mg

## 2018-07-18 NOTE — Procedures (Signed)
Lumbar Facet Joint Intra-Articular Injection(s) with Fluoroscopic Guidance  Patient: [name] [name]      Date of Birth: 1945/12/23 MRN: [id number] PCP: Chevis Pretty, FNP      Visit Date: 07/18/2018   Universal Protocol:    Date/Time: 07/18/2018  Consent Given By: the patient  Position: PRONE   Additional Comments: Vital signs were monitored before and after the procedure. Patient was prepped and draped in the usual sterile fashion. The correct patient, procedure, and site was verified.   Injection Procedure Details:  Procedure Site One Meds Administered:  Meds ordered this encounter  Medications  . methylPREDNISolone acetate (DEPO-MEDROL) injection 80 mg     Laterality: Bilateral  Location/Site:  L4-L5  Needle size: 22 guage  Needle type: Spinal  Needle Placement: Articular  Findings:  -Comments: Excellent flow of contrast producing a partial arthrogram.  Procedure Details: The fluoroscope beam is vertically oriented in AP, and the inferior recess is visualized beneath the lower pole of the inferior apophyseal process, which represents the target point for needle insertion. When direct visualization is difficult the target point is located at the medial projection of the vertebral pedicle. The region overlying each aforementioned target is locally anesthetized with a 1 to 2 ml. volume of 1% Lidocaine without Epinephrine.   The spinal needle was inserted into each of the above mentioned facet joints using biplanar fluoroscopic guidance. A 0.25 to 0.5 ml. volume of Isovue-250 was injected and a partial facet joint arthrogram was obtained. A single spot film was obtained of the resulting arthrogram.    One to 1.25 ml of the steroid/anesthetic solution was then injected into each of the facet joints noted above.   Additional Comments:  The patient tolerated the procedure well Dressing: Band-Aid    Post-procedure details: Patient was observed during the  procedure. Post-procedure instructions were reviewed.  Patient left the clinic in stable condition.

## 2018-07-18 NOTE — Patient Instructions (Signed)

## 2018-07-18 NOTE — Telephone Encounter (Signed)
Patient would like to get another Monovisc injection in both of his knees.  CB#[phone].  Thank you

## 2018-07-18 NOTE — Progress Notes (Signed)
 .  Numeric Pain Rating Scale and Functional Assessment Average Pain 9   In the last MONTH (on 0-10 scale) has pain interfered with the following?  1. General activity like being  able to carry out your everyday physical activities such as walking, climbing stairs, carrying groceries, or moving a chair?  Rating(4)   +Driver, -BT, -Dye Allergies.  

## 2018-07-21 NOTE — Telephone Encounter (Signed)
Noted  

## 2018-07-28 NOTE — Progress Notes (Signed)
   [name] [name] - 72 y.o. male MRN [id number]  Date of birth: Apr 24, 1946  Office Visit Note: Visit Date: 07/18/2018 PCP: Chevis Pretty, FNP Referred by: Hassell Done, Mary-Margaret, *  Subjective: Chief Complaint  Patient presents with  . Lower Back - Pain  . Right Leg - Pain  . Left Leg - Pain   HPI:  [name] [name] is a 72 y.o. male who comes in today For planned bilateral L4-5 facet joint block.  Please see our prior evaluation and management note for further details and justification.  ROS Otherwise per HPI.  Assessment & Plan: Visit Diagnoses:  1. Spondylosis without myelopathy or radiculopathy, lumbar region     Plan: No additional findings.   Meds & Orders:  Meds ordered this encounter  Medications  . methylPREDNISolone acetate (DEPO-MEDROL) injection 80 mg    Orders Placed This Encounter  Procedures  . Facet Injection  . XR C-ARM NO REPORT    Follow-up: Return if symptoms worsen or fail to improve.   Procedures: No procedures performed  Lumbar Facet Joint Intra-Articular Injection(s) with Fluoroscopic Guidance  Patient: [name] [name]      Date of Birth: 12-Mar-1946 MRN: [id number] PCP: Chevis Pretty, FNP      Visit Date: 07/18/2018   Universal Protocol:    Date/Time: 07/18/2018  Consent Given By: the patient  Position: PRONE   Additional Comments: Vital signs were monitored before and after the procedure. Patient was prepped and draped in the usual sterile fashion. The correct patient, procedure, and site was verified.   Injection Procedure Details:  Procedure Site One Meds Administered:  Meds ordered this encounter  Medications  . methylPREDNISolone acetate (DEPO-MEDROL) injection 80 mg     Laterality: Bilateral  Location/Site:  L4-L5  Needle size: 22 guage  Needle type: Spinal  Needle Placement: Articular  Findings:  -Comments: Excellent flow of contrast producing a partial arthrogram.  Procedure Details: The  fluoroscope beam is vertically oriented in AP, and the inferior recess is visualized beneath the lower pole of the inferior apophyseal process, which represents the target point for needle insertion. When direct visualization is difficult the target point is located at the medial projection of the vertebral pedicle. The region overlying each aforementioned target is locally anesthetized with a 1 to 2 ml. volume of 1% Lidocaine without Epinephrine.   The spinal needle was inserted into each of the above mentioned facet joints using biplanar fluoroscopic guidance. A 0.25 to 0.5 ml. volume of Isovue-250 was injected and a partial facet joint arthrogram was obtained. A single spot film was obtained of the resulting arthrogram.    One to 1.25 ml of the steroid/anesthetic solution was then injected into each of the facet joints noted above.   Additional Comments:  The patient tolerated the procedure well Dressing: Band-Aid    Post-procedure details: Patient was observed during the procedure. Post-procedure instructions were reviewed.  Patient left the clinic in stable condition.    Clinical History: Lumbar Spine SOS MRI 12/28/2011  L4-5 facet arthropathy, mild bulging, degenerative changes     Objective:  VS:  HT:    WT:   BMI:     BP:(!) 143/72  HR:(!) 51bpm  TEMP:97.8 F (36.6 C)(Oral)  RESP:  Physical Exam  Ortho Exam Imaging: No results found.

## 2018-08-01 ENCOUNTER — Other Ambulatory Visit: Payer: Self-pay | Admitting: Nurse Practitioner

## 2018-08-01 DIAGNOSIS — F3342 Major depressive disorder, recurrent, in full remission: Secondary | ICD-10-CM

## 2018-08-02 ENCOUNTER — Telehealth (INDEPENDENT_AMBULATORY_CARE_PROVIDER_SITE_OTHER): Payer: Self-pay

## 2018-08-02 NOTE — Telephone Encounter (Signed)
OV 04/28/18 rtc 6 mos

## 2018-08-02 NOTE — Telephone Encounter (Signed)
Submitted VOB for Monovisc, bilateral knee. 

## 2018-08-03 ENCOUNTER — Telehealth (INDEPENDENT_AMBULATORY_CARE_PROVIDER_SITE_OTHER): Payer: Self-pay

## 2018-08-03 NOTE — Telephone Encounter (Signed)
Submitted VOB for Monovisc, bilateral knee. 

## 2018-08-25 ENCOUNTER — Telehealth (INDEPENDENT_AMBULATORY_CARE_PROVIDER_SITE_OTHER): Payer: Self-pay

## 2018-08-25 NOTE — Telephone Encounter (Signed)
Called and left a VM advising patient to CB to schedule an appointment for gel injection.  Approved for Monovisc, bilateral knee. Buy & Bill Covered at 100% through his insurance. No Co-pay No PA required

## 2018-08-29 ENCOUNTER — Ambulatory Visit (INDEPENDENT_AMBULATORY_CARE_PROVIDER_SITE_OTHER): Payer: Medicare Other | Admitting: Orthopaedic Surgery

## 2018-08-29 ENCOUNTER — Encounter (INDEPENDENT_AMBULATORY_CARE_PROVIDER_SITE_OTHER): Payer: Self-pay | Admitting: Orthopaedic Surgery

## 2018-08-29 DIAGNOSIS — M1711 Unilateral primary osteoarthritis, right knee: Secondary | ICD-10-CM

## 2018-08-29 DIAGNOSIS — M1712 Unilateral primary osteoarthritis, left knee: Secondary | ICD-10-CM | POA: Diagnosis not present

## 2018-08-29 MED ORDER — HYALURONAN 88 MG/4ML IX SOSY
88.0000 mg | PREFILLED_SYRINGE | INTRA_ARTICULAR | Status: AC | PRN
  Administered 2018-08-29: 88 mg via INTRA_ARTICULAR

## 2018-08-29 NOTE — Progress Notes (Signed)
   Procedure Note  Patient: [name] [name]             Date of Birth: [birth year]           MRN: [id number]             Visit Date: 08/29/2018  Procedures: Visit Diagnoses: Unilateral primary osteoarthritis, right knee  Unilateral primary osteoarthritis, left knee  Large Joint Inj: R knee on 08/29/2018 3:32 PM Indications: diagnostic evaluation and pain Details: 22 G 1.5 in needle, superolateral approach  Arthrogram: No  Medications: 88 mg Hyaluronan 88 MG/4ML Outcome: tolerated well, no immediate complications Procedure, treatment alternatives, risks and benefits explained, specific risks discussed. Consent was given by the patient. Immediately prior to procedure a time out was called to verify the correct patient, procedure, equipment, support staff and site/side marked as required. Patient was prepped and draped in the usual sterile fashion.   Large Joint Inj: L knee on 08/29/2018 3:33 PM Indications: diagnostic evaluation and pain Details: 22 G 1.5 in needle, superolateral approach  Arthrogram: No  Medications: 88 mg Hyaluronan 88 MG/4ML Outcome: tolerated well, no immediate complications Procedure, treatment alternatives, risks and benefits explained, specific risks discussed. Consent was given by the patient. Immediately prior to procedure a time out was called to verify the correct patient, procedure, equipment, support staff and site/side marked as required. Patient was prepped and draped in the usual sterile fashion.    The patient has known bilateral knee osteoarthritis.  He is here today for bilateral knee Monovisc injection with hyaluronic acid to treat the pain from osteoarthritis.  He has been stable otherwise.  On exam both knees have no effusion with full range of motion but patellofemoral crepitation and medial lateral joint line tenderness from his known osteoarthritis.  He understands the rationale behind injection such as this.  The risk and benefits have been  explained and he tolerated them well.  Follow-up will be as needed.

## 2018-09-02 DIAGNOSIS — Z8546 Personal history of malignant neoplasm of prostate: Secondary | ICD-10-CM | POA: Diagnosis not present

## 2018-09-12 DIAGNOSIS — Z8546 Personal history of malignant neoplasm of prostate: Secondary | ICD-10-CM | POA: Diagnosis not present

## 2018-09-12 DIAGNOSIS — N5231 Erectile dysfunction following radical prostatectomy: Secondary | ICD-10-CM | POA: Diagnosis not present

## 2018-09-14 HISTORY — PX: EYE SURGERY: SHX253

## 2018-10-20 ENCOUNTER — Encounter: Payer: Self-pay | Admitting: *Deleted

## 2018-10-20 ENCOUNTER — Ambulatory Visit (INDEPENDENT_AMBULATORY_CARE_PROVIDER_SITE_OTHER): Payer: Medicare Other | Admitting: *Deleted

## 2018-10-20 VITALS — BP 104/60 | HR 61 | Ht 75.0 in | Wt 237.0 lb

## 2018-10-20 DIAGNOSIS — Z0000 Encounter for general adult medical examination without abnormal findings: Secondary | ICD-10-CM

## 2018-10-20 NOTE — Patient Instructions (Addendum)
Goals    . Exercise 150 min/wk Moderate Activity     Using elliptical and lifting weights are great options.               Please review the information given on Advance Directives.  If you complete the paperwork, please bring a copy to our office to be filed in your medical record.   Please follow up with Chevis Pretty, FNP and your specialists as scheduled.   Thank you for coming in for your Annual Wellness Visit today!   Preventive Care 66 Years and Older, Male Preventive care refers to lifestyle choices and visits with your health care provider that can promote health and wellness. What does preventive care include?   A yearly physical exam. This is also called an annual well check.  Dental exams once or twice a year.  Routine eye exams. Ask your health care provider how often you should have your eyes checked.  Personal lifestyle choices, including: ? Daily care of your teeth and gums. ? Regular physical activity. ? Eating a healthy diet. ? Avoiding tobacco and drug use. ? Limiting alcohol use. ? Practicing safe sex. ? Taking low doses of aspirin every day. ? Taking vitamin and mineral supplements as recommended by your health care provider. What happens during an annual well check? The services and screenings done by your health care provider during your annual well check will depend on your age, overall health, lifestyle risk factors, and family history of disease. Counseling Your health care provider may ask you questions about your:  Alcohol use.  Tobacco use.  Drug use.  Emotional well-being.  Home and relationship well-being.  Sexual activity.  Eating habits.  History of falls.  Memory and ability to understand (cognition).  Work and work Statistician. Screening You may have the following tests or measurements:  Height, weight, and BMI.  Blood pressure.  Lipid and cholesterol levels. These may be checked every 5 years, or more  frequently if you are over 65 years old.  Skin check.  Lung cancer screening. You may have this screening every year starting at age 39 if you have a 30-pack-year history of smoking and currently smoke or have quit within the past 15 years.  Colorectal cancer screening. All adults should have this screening starting at age 74 and continuing until age 31. You will have tests every 1-10 years, depending on your results and the type of screening test. People at increased risk should start screening at an earlier age. Screening tests may include: ? Guaiac-based fecal occult blood testing. ? Fecal immunochemical test (FIT). ? Stool DNA test. ? Virtual colonoscopy. ? Sigmoidoscopy. During this test, a flexible tube with a tiny camera (sigmoidoscope) is used to examine your rectum and lower colon. The sigmoidoscope is inserted through your anus into your rectum and lower colon. ? Colonoscopy. During this test, a long, thin, flexible tube with a tiny camera (colonoscope) is used to examine your entire colon and rectum.  Prostate cancer screening. Recommendations will vary depending on your family history and other risks.  Hepatitis C blood test.  Hepatitis B blood test.  Sexually transmitted disease (STD) testing.  Diabetes screening. This is done by checking your blood sugar (glucose) after you have not eaten for a while (fasting). You may have this done every 1-3 years.  Abdominal aortic aneurysm (AAA) screening. You may need this if you are a current or former smoker.  Osteoporosis. You may be screened starting at age  70 if you are at high risk. Talk with your health care provider about your test results, treatment options, and if necessary, the need for more tests. Vaccines Your health care provider may recommend certain vaccines, such as:  Influenza vaccine. This is recommended every year.  Tetanus, diphtheria, and acellular pertussis (Tdap, Td) vaccine. You may need a Td booster every  10 years.  Varicella vaccine. You may need this if you have not been vaccinated.  Zoster vaccine. You may need this after age 62.  Measles, mumps, and rubella (MMR) vaccine. You may need at least one dose of MMR if you were born in 1957 or later. You may also need a second dose.  Pneumococcal 13-valent conjugate (PCV13) vaccine. One dose is recommended after age 52.  Pneumococcal polysaccharide (PPSV23) vaccine. One dose is recommended after age 87.  Meningococcal vaccine. You may need this if you have certain conditions.  Hepatitis A vaccine. You may need this if you have certain conditions or if you travel or work in places where you may be exposed to hepatitis A.  Hepatitis B vaccine. You may need this if you have certain conditions or if you travel or work in places where you may be exposed to hepatitis B.  Haemophilus influenzae type b (Hib) vaccine. You may need this if you have certain risk factors. Talk to your health care provider about which screenings and vaccines you need and how often you need them. This information is not intended to replace advice given to you by your health care provider. Make sure you discuss any questions you have with your health care provider. Document Released: 09/27/2015 Document Revised: 10/21/2017 Document Reviewed: 07/02/2015 Elsevier Interactive Patient Education  2019 [employer].

## 2018-10-20 NOTE — Progress Notes (Addendum)
Subjective:   [name] [name] is a 73 y.o. male who presents for a subsequent Medicare Annual Wellness Visit.  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  He enjoys working on cars, yard work, reading, and Therapist, occupational.  He also participates in a group that provides transportation to appointments for [employer].  He lives at home with his wife and his dog.  [name] [name] has 3 children and 5 grandchildren.    Patient Care Team: Chevis Pretty, FNP as PCP - General (Family Medicine) Magnus Sinning, MD as Consulting Physician Magnus Sinning, MD as Consulting Physician Mcarthur Rossetti, MD as Consulting Physician (Orthopedic Surgery) Harlen Labs, MD as Referring Physician (Optometry) Jessy Oto, MD as Consulting Physician (Orthopedic Surgery) Geri Seminole, MD as Referring Physician (Dermatopathology)  Hospitalizations, surgeries, and ER visits in previous 12 months No hospitalizations or ER visits in the past year.  He did have right rotator cuff repair May 2019.   Review of Systems    Patient reports that his overall health is unchanged compared to last year.  Cardiac Risk Factors include: advanced age (>81men, >50 women);hypertension;male gender    All other systems negative       Current Medications (verified) Outpatient Encounter Medications as of 10/20/2018  Medication Sig  . amLODipine (NORVASC) 5 MG tablet Take 1 tablet (5 mg total) by mouth daily.  Marland Kitchen aspirin EC 81 MG tablet Take 81 mg by mouth daily.  Marland Kitchen atenolol (TENORMIN) 25 MG tablet Take 1 tablet (25 mg total) by mouth daily.  . budesonide-formoterol (SYMBICORT) 160-4.5 MCG/ACT inhaler Inhale 2 puffs into the lungs 2 (two) times daily. (Patient taking differently: Inhale 2 puffs into the lungs 2 (two) times daily as needed. )  . cetirizine (ZYRTEC) 10 MG tablet Take 10 mg by mouth daily as needed.   . cholecalciferol (VITAMIN D)  1000 UNITS tablet Take 1,000 Units by mouth daily.  . citalopram (CELEXA) 20 MG tablet Take 1 tablet (20 mg total) by mouth daily.  . cyanocobalamin (,VITAMIN B-12,) 1000 MCG/ML injection INJECT 1ML EVERY MONTH  . fenofibrate (TRICOR) 145 MG tablet TAKE (1) TABLET DAILY BEFORE BREAKFAST.  Marland Kitchen HYDROcodone-acetaminophen (NORCO/VICODIN) 5-325 MG tablet Take 1-2 tablets by mouth every 6 (six) hours as needed for moderate pain.  Marland Kitchen loperamide (IMODIUM) 2 MG capsule Take 2 mg by mouth daily.  . methocarbamol (ROBAXIN) 500 MG tablet Take 1 tablet (500 mg total) by mouth 3 (three) times daily.  . montelukast (SINGULAIR) 10 MG tablet Take 1 tablet (10 mg total) by mouth daily.  . Multiple Vitamins-Minerals (MULTIVITAMIN WITH MINERALS) tablet Take 1 tablet by mouth daily.  . naproxen (NAPROSYN) 500 MG tablet TAKE  (1)  TABLET TWICE A DAY WITH FOOD. (Patient taking differently: Take 500 mg by mouth daily. )  . sildenafil (REVATIO) 20 MG tablet Take 20 mg by mouth 3 (three) times daily as needed.  . rosuvastatin (CRESTOR) 5 MG tablet Take 1 tablet (5 mg total) by mouth daily.   No facility-administered encounter medications on file as of 10/20/2018.     Allergies (verified) Patient has no known allergies.   History: Past Medical History:  Diagnosis Date  . Asthma, chronic 06/02/2013  . Carpal tunnel syndrome 04/17/2015   Bilateral  . Depression 06/02/2013  . DYSPNEA ON EXERTION 06/04/2009   Qualifier: Diagnosis of  By: Domenic Polite, MD, [facility] Hay   . GERD (gastroesophageal reflux disease)   .  Hereditary and idiopathic peripheral neuropathy 04/17/2015  . Hyperlipemia 06/02/2013  . LEG PAIN, BILATERAL 06/04/2009   Qualifier: Diagnosis of  By: Domenic Polite, MD, Phillips Hay   . PERCUTANEOUS TRANSLUMINAL CORONARY ANGIOPLASTY, HX OF 06/04/2009   Qualifier: Diagnosis of  By: Domenic Polite, MD, Phillips Hay   . Prostate CA [facility])    Past Surgical History:  Procedure Laterality Date  . CARDIAC  CATHETERIZATION  1997  . HIATAL HERNIA REPAIR    . LAPAROSCOPIC GASTRIC BANDING  2000   [facility]   . ROBOT ASSISTED LAPAROSCOPIC RADICAL PROSTATECTOMY     Family History  Problem Relation Age of Onset  . Heart disease Other        Early Cardiac disease for uncle is their 4's as well as deceased.  Marland Kitchen Heart failure Mother   . Dementia Mother 40  . Cancer Father 67       testicular, lung  . Esophageal cancer Brother    Social History   Socioeconomic History  . Marital status: Married    Spouse name: [name]  . Number of children: 3  . Years of education: Bachelors degree  . Highest education level: Bachelor's degree (e.g., BA, AB, BS)  Occupational History  . Occupation: Retired    Comment: Wellsite geologist  . Financial resource strain: Not hard at all  . Food insecurity:    Worry: Never true    Inability: Never true  . Transportation needs:    Medical: No    Non-medical: No  Tobacco Use  . Smoking status: Former Smoker    Packs/day: 1.00    Years: 20.00    Pack years: 20.00    Types: Cigarettes    Last attempt to quit: 01/19/1996    Years since quitting: 22.7  . Smokeless tobacco: Never Used  Substance and Sexual Activity  . Alcohol use: Yes    Alcohol/week: 2.0 standard drinks    Types: 2 Cans of beer per week  . Drug use: No  . Sexual activity: Yes  Lifestyle  . Physical activity:    Days per week: 0 days    Minutes per session: 0 min  . Stress: Not at all  Relationships  . Social connections:    Talks on phone: More than three times a week    Gets together: More than three times a week    Attends religious service: Never    Active member of club or organization: Yes    Attends meetings of clubs or organizations: More than 4 times per year    Relationship status: Married  Other Topics Concern  . Not on file  Social History Narrative   Patient is married and lives at home with is wife. He has three adult children and five grandchildren. 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      Clinical Intake:     Pain Score: 0-No pain                  Activities of Daily Living In your present state of health, do you have any difficulty performing the following activities: 10/20/2018  Hearing? N  Vision? Y  Comment Distance vision blurry.  Has appointment this month for follow up with doctor in [place], [place].   Difficulty concentrating or making decisions? N  Walking or climbing stairs? Y  Comment Due to knee and hip pain  Dressing or bathing? N  Doing errands, shopping? N  Preparing Food  and eating ? N  Using the Toilet? N  In the past six months, have you accidently leaked urine? N  Do you have problems with loss of bowel control? Y  Comment Takes immodium daily to manage.  Has been evaluated by GI  Managing your Medications? N  Managing your Finances? N  Housekeeping or managing your Housekeeping? N  Some recent data might be hidden     Exercise Current Exercise Habits: The patient does not participate in regular exercise at present, Exercise limited by: orthopedic condition(s)  Diet Consumes 3 meals a day and 0 snacks a day.  The patient feels that they mostly follow a low carbohydrate diet.   Diet History no problem areas noted   Depression Screen PHQ 2/9 Scores 10/20/2018 04/28/2018 01/07/2018 10/18/2017 08/12/2017 03/23/2017 07/07/2016  PHQ - 2 Score 0 0 0 0 0 0 0  PHQ- 9 Score - - - - - - -     Fall Risk Fall Risk  10/20/2018 04/28/2018 01/07/2018 10/18/2017 08/12/2017  Falls in the past year? 0 No No No No     Objective:    Today's Vitals   10/20/18 1007 10/20/18 1048  BP: (!) 148/70 104/60  Pulse: 60 61  Weight: 237 lb (107.5 kg)   Height: 6\' 3"  (1.905 m)   PainSc: 0-No pain    Body mass index is 29.62 kg/m.  Advanced Directives 10/20/2018 02/25/2018 02/17/2018 10/08/2014  Does Patient Have a Medical Advance Directive? No No No No  Would patient like information on creating a medical  advance directive? Yes (MAU/Ambulatory/Procedural Areas - Information given) No - Patient declined - No - patient declined information    Hearing/Vision  No hearing or vision deficits noted during visit. Patient scheduled for an eye exam last this month in [place], [place].  He does not know the name of the doctor he is seeing.  Cognitive Function: MMSE - Mini Mental State Exam 10/20/2018 10/18/2017 07/07/2016  Orientation to time 5 5 5   Orientation to Place 5 5 5   Registration 3 3 3   Attention/ Calculation 5 5 5   Recall 3 3 3   Language- name 2 objects 2 2 2   Language- repeat 1 1 1   Language- follow 3 step command 3 3 3   Language- read & follow direction 1 1 1   Write a sentence 1 1 1   Copy design 1 0 1  Total score 30 29 30           Immunizations and Health Maintenance Immunization History  Administered Date(s) Administered  . Influenza, High Dose Seasonal PF 07/07/2016, 06/15/2017, 07/14/2018  . Influenza,inj,Quad PF,6+ Mos 06/27/2013, 06/25/2014, 06/28/2015  . Pneumococcal Conjugate-13 06/28/2015  . Pneumococcal Polysaccharide-23 10/27/2011  . Tdap 07/07/2016  . Zoster Recombinat (Shingrix) 12/09/2017, 02/16/2018   There are no preventive care reminders to display for this patient. Health Maintenance  Topic Date Due  . COLONOSCOPY  12/14/2019  . TETANUS/TDAP  07/07/2026  . INFLUENZA VACCINE  Completed  . Hepatitis C Screening  Completed  . PNA vac Low Risk Adult  Completed        Assessment:   This is a routine wellness examination for Renley.    Plan:    Goals    . Exercise 150 min/wk Moderate Activity     Using elliptical and lifting weights are great options.     . Exercise 3x per week (30 min per time)     Continue to walk 30 min daily  Health Maintenance & Additional Screening Recommendations: Advanced directives: has NO advanced directive  - add't info requested. Referral to SW: no  Lung: Low Dose CT Chest recommended if Age 72-80 years, 30  pack-year currently smoking OR have quit w/in 15years. Patient does not qualify. Hepatitis C Screening recommended: completed 04/28/18  Today's Orders No orders of the defined types were placed in this encounter.   Keep f/u with Chevis Pretty, FNP and any other specialty appointments you may have Continue current medications Move carefully to avoid falls. Use assistive devices like a cane or walker if needed. Aim for at least 150 minutes of moderate activity a week. This can be done with chair exercises if necessary. Read or work on puzzles daily Stay connected with friends and family  I have personally reviewed and noted the following in the patient's chart:   . Medical and social history . Use of alcohol, tobacco or illicit drugs  . Current medications and supplements . Functional ability and status . Nutritional status . Physical activity . Advanced directives . List of other physicians . Hospitalizations, surgeries, and ER visits in previous 12 months . Vitals . Screenings to include cognitive, depression, and falls . Referrals and appointments  In addition, I have reviewed and discussed with patient certain preventive protocols, quality metrics, and best practice recommendations. A written personalized care plan for preventive services as well as general preventive health recommendations were provided to patient.     Nolberto Hanlon, RN  10/20/2018   I have reviewed and agree with the above AWV documentation.   Mary-Margaret Hassell Done, FNP

## 2018-10-24 DIAGNOSIS — L570 Actinic keratosis: Secondary | ICD-10-CM | POA: Diagnosis not present

## 2018-10-24 DIAGNOSIS — L579 Skin changes due to chronic exposure to nonionizing radiation, unspecified: Secondary | ICD-10-CM | POA: Diagnosis not present

## 2018-10-24 DIAGNOSIS — Z85828 Personal history of other malignant neoplasm of skin: Secondary | ICD-10-CM | POA: Diagnosis not present

## 2018-10-24 DIAGNOSIS — L821 Other seborrheic keratosis: Secondary | ICD-10-CM | POA: Diagnosis not present

## 2018-10-24 DIAGNOSIS — L820 Inflamed seborrheic keratosis: Secondary | ICD-10-CM | POA: Diagnosis not present

## 2018-10-24 DIAGNOSIS — L814 Other melanin hyperpigmentation: Secondary | ICD-10-CM | POA: Diagnosis not present

## 2018-10-31 ENCOUNTER — Other Ambulatory Visit: Payer: Self-pay | Admitting: Nurse Practitioner

## 2018-10-31 DIAGNOSIS — F3342 Major depressive disorder, recurrent, in full remission: Secondary | ICD-10-CM

## 2018-11-01 ENCOUNTER — Encounter: Payer: Self-pay | Admitting: Nurse Practitioner

## 2018-11-01 ENCOUNTER — Ambulatory Visit (INDEPENDENT_AMBULATORY_CARE_PROVIDER_SITE_OTHER): Payer: Medicare Other | Admitting: Nurse Practitioner

## 2018-11-01 VITALS — BP 130/66 | HR 54 | Temp 97.0°F | Ht 75.0 in | Wt 235.0 lb

## 2018-11-01 DIAGNOSIS — Z0000 Encounter for general adult medical examination without abnormal findings: Secondary | ICD-10-CM

## 2018-11-01 DIAGNOSIS — Z9861 Coronary angioplasty status: Secondary | ICD-10-CM

## 2018-11-01 DIAGNOSIS — E782 Mixed hyperlipidemia: Secondary | ICD-10-CM

## 2018-11-01 DIAGNOSIS — K219 Gastro-esophageal reflux disease without esophagitis: Secondary | ICD-10-CM | POA: Diagnosis not present

## 2018-11-01 DIAGNOSIS — I10 Essential (primary) hypertension: Secondary | ICD-10-CM | POA: Diagnosis not present

## 2018-11-01 DIAGNOSIS — G609 Hereditary and idiopathic neuropathy, unspecified: Secondary | ICD-10-CM | POA: Diagnosis not present

## 2018-11-01 DIAGNOSIS — Z6828 Body mass index (BMI) 28.0-28.9, adult: Secondary | ICD-10-CM | POA: Diagnosis not present

## 2018-11-01 DIAGNOSIS — J4520 Mild intermittent asthma, uncomplicated: Secondary | ICD-10-CM | POA: Diagnosis not present

## 2018-11-01 DIAGNOSIS — F3342 Major depressive disorder, recurrent, in full remission: Secondary | ICD-10-CM | POA: Diagnosis not present

## 2018-11-01 MED ORDER — AMLODIPINE BESYLATE 5 MG PO TABS: 5.0000 mg | ORAL_TABLET | Freq: Every day | ORAL | 1 refills | Status: DC

## 2018-11-01 MED ORDER — CITALOPRAM HYDROBROMIDE 20 MG PO TABS: 20.0000 mg | ORAL_TABLET | Freq: Every day | ORAL | 1 refills | Status: DC

## 2018-11-01 MED ORDER — FENOFIBRATE 145 MG PO TABS: ORAL_TABLET | ORAL | 1 refills | Status: DC

## 2018-11-01 MED ORDER — ATENOLOL 25 MG PO TABS: 25.0000 mg | ORAL_TABLET | Freq: Every day | ORAL | 1 refills | Status: DC

## 2018-11-01 MED ORDER — ROSUVASTATIN CALCIUM 5 MG PO TABS
5.0000 mg | ORAL_TABLET | Freq: Every day | ORAL | 1 refills | Status: DC
Start: 2018-11-01 — End: 2018-12-05

## 2018-11-01 MED ORDER — MONTELUKAST SODIUM 10 MG PO TABS: 10.0000 mg | ORAL_TABLET | Freq: Every day | ORAL | 1 refills | Status: DC

## 2018-11-01 MED ORDER — BUDESONIDE-FORMOTEROL FUMARATE 160-4.5 MCG/ACT IN AERO: 2.0000 | INHALATION_SPRAY | Freq: Two times a day (BID) | RESPIRATORY_TRACT | 3 refills | Status: DC

## 2018-11-01 NOTE — Addendum Note (Signed)
Addended by: Chevis Pretty on: 11/01/2018 08:38 AM   Modules accepted: Orders

## 2018-11-01 NOTE — Patient Instructions (Signed)
Health Maintenance After Age 73 After age 73, you are at a higher risk for certain long-term diseases and infections as well as injuries from falls. Falls are a major cause of broken bones and head injuries in people who are older than age 73. Getting regular preventive care can help to keep you healthy and well. Preventive care includes getting regular testing and making lifestyle changes as recommended by your health care provider. Talk with your health care provider about:  Which screenings and tests you should have. A screening is a test that checks for a disease when you have no symptoms.  A diet and exercise plan that is right for you. What should I know about screenings and tests to prevent falls? Screening and testing are the best ways to find a health problem early. Early diagnosis and treatment give you the best chance of managing medical conditions that are common after age 73. Certain conditions and lifestyle choices may make you more likely to have a fall. Your health care provider may recommend:  Regular vision checks. Poor vision and conditions such as cataracts can make you more likely to have a fall. If you wear glasses, make sure to get your prescription updated if your vision changes.  Medicine review. Work with your health care provider to regularly review all of the medicines you are taking, including over-the-counter medicines. Ask your health care provider about any side effects that may make you more likely to have a fall. Tell your health care provider if any medicines that you take make you feel dizzy or sleepy.  Osteoporosis screening. Osteoporosis is a condition that causes the bones to get weaker. This can make the bones weak and cause them to break more easily.  Blood pressure screening. Blood pressure changes and medicines to control blood pressure can make you feel dizzy.  Strength and balance checks. Your health care provider may recommend certain tests to check your  strength and balance while standing, walking, or changing positions.  Foot health exam. Foot pain and numbness, as well as not wearing proper footwear, can make you more likely to have a fall.  Depression screening. You may be more likely to have a fall if you have a fear of falling, feel emotionally low, or feel unable to do activities that you used to do.  Alcohol use screening. Using too much alcohol can affect your balance and may make you more likely to have a fall. What actions can I take to lower my risk of falls? General instructions  Talk with your health care provider about your risks for falling. Tell your health care provider if: ? You fall. Be sure to tell your health care provider about all falls, even ones that seem minor. ? You feel dizzy, sleepy, or off-balance.  Take over-the-counter and prescription medicines only as told by your health care provider. These include any supplements.  Eat a healthy diet and maintain a healthy weight. A healthy diet includes low-fat dairy products, low-fat (lean) meats, and fiber from whole grains, beans, and lots of fruits and vegetables. Home safety  Remove any tripping hazards, such as rugs, cords, and clutter.  Install safety equipment such as grab bars in bathrooms and safety rails on stairs.  Keep rooms and walkways well-lit. Activity   Follow a regular exercise program to stay fit. This will help you maintain your balance. Ask your health care provider what types of exercise are appropriate for you.  If you need a cane or   walker, use it as recommended by your health care provider.  Wear supportive shoes that have nonskid soles. Lifestyle  Do not drink alcohol if your health care provider tells you not to drink.  If you drink alcohol, limit how much you have: ? 0-1 drink a day for women. ? 0-2 drinks a day for men.  Be aware of how much alcohol is in your drink. In the [place] one drink equals one typical bottle of beer (12  oz), one-half glass of wine (5 oz), or one shot of hard liquor (1 oz).  Do not use any products that contain nicotine or tobacco, such as cigarettes and e-cigarettes. If you need help quitting, ask your health care provider. Summary  Having a healthy lifestyle and getting preventive care can help to protect your health and wellness after age 73.  Screening and testing are the best way to find a health problem early and help you avoid having a fall. Early diagnosis and treatment give you the best chance for managing medical conditions that are more common for people who are older than age 73.  Falls are a major cause of broken bones and head injuries in people who are older than age 73. Take precautions to prevent a fall at home.  Work with your health care provider to learn what changes you can make to improve your health and wellness and to prevent falls. This information is not intended to replace advice given to you by your health care provider. Make sure you discuss any questions you have with your health care provider. Document Released: 07/14/2017 Document Revised: 07/14/2017 Document Reviewed: 07/14/2017 Elsevier Interactive Patient Education  2019 [employer]  

## 2018-11-01 NOTE — Progress Notes (Signed)
Subjective:    Patient ID: [name] [name], male    DOB: 12-02-1945, 73 y.o.   MRN: [id number]   Chief Complaint: annual physical   HPI:  1. Essential hypertension  No c/o chest pain, sob or headache. Does not check blood pressure at home. BP Readings from Last 3 Encounters:  10/20/18 104/60  07/18/18 (!) 143/72  07/07/18 138/66     2. Mixed hyperlipidemia  Does not watch diet and does no exercises.  3. Gastroesophageal reflux disease, esophagitis presence not specified  is currently not taking anything. Has occasional symptoms.  4. Hereditary and idiopathic peripheral neuropathy  Has constant burning on bil feet. He does not take anything for it right now.  5. Recurrent major depressive disorder, in full remission [facility])  Patient is on celexa daly and is doing well. Depression screen [place] 2/9 11/01/2018 10/20/2018 04/28/2018  Decreased Interest 0 0 0  Down, Depressed, Hopeless 0 0 0  PHQ - 2 Score 0 0 0  Altered sleeping - - -  Tired, decreased energy - - -  Change in appetite - - -  Feeling bad or failure about yourself  - - -  Trouble concentrating - - -  Moving slowly or fidgety/restless - - -  Suicidal thoughts - - -  PHQ-9 Score - - -     6. PERCUTANEOUS TRANSLUMINAL CORONARY ANGIOPLASTY, HX OF  this was done in 2012.. see cardiologist yearly. Last seen 11/2017. According to office note no changes were made too plan of care and he was to follow up in 1 year.  7. BMI 28.0-28.9,adult  No recent weight changes.    Outpatient Encounter Medications as of 11/01/2018  Medication Sig  . amLODipine (NORVASC) 5 MG tablet Take 1 tablet (5 mg total) by mouth daily.  Marland Kitchen aspirin EC 81 MG tablet Take 81 mg by mouth daily.  Marland Kitchen atenolol (TENORMIN) 25 MG tablet Take 1 tablet (25 mg total) by mouth daily.  . budesonide-formoterol (SYMBICORT) 160-4.5 MCG/ACT inhaler Inhale 2 puffs into the lungs 2 (two) times daily. (Patient taking differently: Inhale 2 puffs into the lungs 2 (two) times  daily as needed. )  . cetirizine (ZYRTEC) 10 MG tablet Take 10 mg by mouth daily as needed.   . cholecalciferol (VITAMIN D) 1000 UNITS tablet Take 1,000 Units by mouth daily.  . citalopram (CELEXA) 20 MG tablet Take 1 tablet (20 mg total) by mouth daily.  . cyanocobalamin (,VITAMIN B-12,) 1000 MCG/ML injection INJECT 1ML EVERY MONTH  . fenofibrate (TRICOR) 145 MG tablet TAKE (1) TABLET DAILY BEFORE BREAKFAST.  Marland Kitchen HYDROcodone-acetaminophen (NORCO/VICODIN) 5-325 MG tablet Take 1-2 tablets by mouth every 6 (six) hours as needed for moderate pain.  Marland Kitchen loperamide (IMODIUM) 2 MG capsule Take 2 mg by mouth daily.  . methocarbamol (ROBAXIN) 500 MG tablet Take 1 tablet (500 mg total) by mouth 3 (three) times daily.  . montelukast (SINGULAIR) 10 MG tablet Take 1 tablet (10 mg total) by mouth daily.  . Multiple Vitamins-Minerals (MULTIVITAMIN WITH MINERALS) tablet Take 1 tablet by mouth daily.  . naproxen (NAPROSYN) 500 MG tablet TAKE  (1)  TABLET TWICE A DAY WITH FOOD. (Patient taking differently: Take 500 mg by mouth daily. )  . rosuvastatin (CRESTOR) 5 MG tablet Take 1 tablet (5 mg total) by mouth daily.  . sildenafil (REVATIO) 20 MG tablet Take 20 mg by mouth 3 (three) times daily as needed.       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   Review of Systems  Constitutional: Negative for activity change and appetite change.  HENT: Negative.   Eyes: Negative for pain.  Respiratory: Negative for shortness of breath.   Cardiovascular: Negative for chest pain, palpitations and leg swelling.  Gastrointestinal: Negative for abdominal pain.  Endocrine: Negative for polydipsia.  Genitourinary: Negative.   Skin: Negative for rash.  Neurological: Negative for dizziness, weakness and headaches.  Hematological: Does not bruise/bleed easily.  Psychiatric/Behavioral: Negative.   All other systems reviewed and are negative.      Objective:   Physical  Exam Vitals signs and nursing note reviewed.  Constitutional:      Appearance: Normal appearance. He is well-developed and normal weight.  HENT:     Head: Normocephalic.     Nose: Nose normal.  Eyes:     Pupils: Pupils are equal, round, and reactive to light.  Neck:     Musculoskeletal: Normal range of motion and neck supple.     Thyroid: No thyroid mass or thyromegaly.     Vascular: No carotid bruit or JVD.     Trachea: Phonation normal.  Cardiovascular:     Rate and Rhythm: Normal rate and regular rhythm.  Pulmonary:     Effort: Pulmonary effort is normal. No respiratory distress.     Breath sounds: Normal breath sounds.  Abdominal:     General: Bowel sounds are normal.     Palpations: Abdomen is soft.     Tenderness: There is no abdominal tenderness.  Musculoskeletal: Normal range of motion.  Lymphadenopathy:     Cervical: No cervical adenopathy.  Skin:    General: Skin is warm and dry.  Neurological:     Mental Status: He is alert and oriented to person, place, and time.  Psychiatric:        Behavior: Behavior normal.        Thought Content: Thought content normal.        Judgment: Judgment normal.    BP 130/66   Pulse (!) 54   Temp (!) 97 F (36.1 C) (Oral)   Ht 6\' 3"  (1.905 m)   Wt 235 lb (106.6 kg)   BMI 29.37 kg/m        Assessment & Plan:  DEQUAVIOUS HARSHBERGER comes in today with chief complaint of Medical Management of Chronic Issues   Diagnosis and orders addressed:  1. Essential hypertension Low sodium diet - amLODipine (NORVASC) 5 MG tablet; Take 1 tablet (5 mg total) by mouth daily.  Dispense: 90 tablet; Refill: 1 - atenolol (TENORMIN) 25 MG tablet; Take 1 tablet (25 mg total) by mouth daily.  Dispense: 90 tablet; Refill: 1 - budesonide-formoterol (SYMBICORT) 160-4.5 MCG/ACT inhaler; Inhale 2 puffs into the lungs 2 (two) times daily.  Dispense: 3 Inhaler; Refill: 3  2. Mixed hyperlipidemia Low fat diet - rosuvastatin (CRESTOR) 5 MG tablet; Take 1  tablet (5 mg total) by mouth daily.  Dispense: 90 tablet; Refill: 1 - fenofibrate (TRICOR) 145 MG tablet; TAKE (1) TABLET DAILY BEFORE BREAKFAST.  Dispense: 90 tablet; Refill: 1  3. Gastroesophageal reflux disease, esophagitis presence not specified Avoid spicy foods Do not eat 2 hours prior to bedtime  4. Hereditary and idiopathic peripheral neuropathy Do not go barefooted  5. Recurrent major depressive disorder, in full remission ([facility]) Stress management - citalopram (CELEXA) 20 MG tablet; Take 1 tablet (20 mg total) by mouth daily.  Dispense: 90 tablet; Refill: 1  6. PERCUTANEOUS TRANSLUMINAL CORONARY ANGIOPLASTY,  HX OF Keep up yearly follow ups with cardiology  7. BMI 28.0-28.9,adult Discussed diet and exercise for person with BMI >25 Will recheck weight in 3-6 months  8. Annual physical exam  9. Mild intermittent chronic asthma without complication - montelukast (SINGULAIR) 10 MG tablet; Take 1 tablet (10 mg total) by mouth daily.  Dispense: 90 tablet; Refill: 1   Labs pending Health Maintenance reviewed Diet and exercise encouraged  Follow up plan: 6 months   Mary-Margaret Hassell Done, FNP

## 2018-11-07 DIAGNOSIS — H25812 Combined forms of age-related cataract, left eye: Secondary | ICD-10-CM | POA: Diagnosis not present

## 2018-11-07 DIAGNOSIS — H43813 Vitreous degeneration, bilateral: Secondary | ICD-10-CM | POA: Diagnosis not present

## 2018-11-07 DIAGNOSIS — H5213 Myopia, bilateral: Secondary | ICD-10-CM | POA: Diagnosis not present

## 2018-11-07 DIAGNOSIS — H25811 Combined forms of age-related cataract, right eye: Secondary | ICD-10-CM | POA: Diagnosis not present

## 2018-11-07 DIAGNOSIS — H40013 Open angle with borderline findings, low risk, bilateral: Secondary | ICD-10-CM | POA: Diagnosis not present

## 2018-11-07 DIAGNOSIS — H524 Presbyopia: Secondary | ICD-10-CM | POA: Diagnosis not present

## 2018-11-08 NOTE — Progress Notes (Signed)
Left message reminding patient to have labs drawn

## 2018-11-18 DIAGNOSIS — H2511 Age-related nuclear cataract, right eye: Secondary | ICD-10-CM | POA: Diagnosis not present

## 2018-11-18 DIAGNOSIS — H2513 Age-related nuclear cataract, bilateral: Secondary | ICD-10-CM | POA: Diagnosis not present

## 2018-11-18 DIAGNOSIS — H40013 Open angle with borderline findings, low risk, bilateral: Secondary | ICD-10-CM | POA: Diagnosis not present

## 2018-12-05 ENCOUNTER — Telehealth: Payer: Self-pay | Admitting: Cardiology

## 2018-12-05 DIAGNOSIS — E782 Mixed hyperlipidemia: Secondary | ICD-10-CM

## 2018-12-05 DIAGNOSIS — I10 Essential (primary) hypertension: Secondary | ICD-10-CM

## 2018-12-05 MED ORDER — FENOFIBRATE 145 MG PO TABS: ORAL_TABLET | ORAL | 3 refills | Status: DC

## 2018-12-05 MED ORDER — ATENOLOL 25 MG PO TABS: 25.0000 mg | ORAL_TABLET | Freq: Every day | ORAL | 3 refills | Status: DC

## 2018-12-05 MED ORDER — AMLODIPINE BESYLATE 5 MG PO TABS: 5.0000 mg | ORAL_TABLET | Freq: Every day | ORAL | 3 refills | Status: DC

## 2018-12-05 MED ORDER — ROSUVASTATIN CALCIUM 5 MG PO TABS: 5.0000 mg | ORAL_TABLET | Freq: Every day | ORAL | 3 refills | Status: DC

## 2018-12-05 NOTE — Telephone Encounter (Signed)
The patient was called and currently unavailable, but wife state that he currently has no symptoms. They will call us if any new symptoms occur. Please reschedule for 6-12 weeks and make sure they have refills.  Thank you,  Ena Dawley

## 2018-12-05 NOTE — Telephone Encounter (Signed)
Called the pt and wife and sent in all his cardiac medications to their confirmed pharmacy of choice.  Will send this message to our COVID19 cancel pool for PRIORITY #2, per Dr [name].  Both verbalized understanding and agrees with this plan.  Pt and wife were very gracious for all the assistance provided.

## 2018-12-05 NOTE — Addendum Note (Signed)
Addended by: Nuala Alpha on: 12/05/2018 07:02 PM   Modules accepted: Orders

## 2018-12-12 ENCOUNTER — Ambulatory Visit: Payer: Medicare Other | Admitting: Cardiology

## 2018-12-27 ENCOUNTER — Telehealth: Payer: Self-pay | Admitting: *Deleted

## 2018-12-27 ENCOUNTER — Telehealth: Payer: Self-pay | Admitting: Cardiology

## 2018-12-27 ENCOUNTER — Telehealth (INDEPENDENT_AMBULATORY_CARE_PROVIDER_SITE_OTHER): Payer: Self-pay | Admitting: Pharmacist

## 2018-12-27 DIAGNOSIS — Z789 Other specified health status: Secondary | ICD-10-CM

## 2018-12-27 DIAGNOSIS — E782 Mixed hyperlipidemia: Secondary | ICD-10-CM

## 2018-12-27 DIAGNOSIS — Z9861 Coronary angioplasty status: Secondary | ICD-10-CM

## 2018-12-27 DIAGNOSIS — E785 Hyperlipidemia, unspecified: Secondary | ICD-10-CM

## 2018-12-27 NOTE — Telephone Encounter (Signed)
Dr. [name], this pt stopped taking both rosuvastatin and fenofibrate on Sunday, due to complaints of joint aches.  He has remained off of both medications and his joint pains have resolved.  Pt would like for you to advise on what he should do as far as taking a new regimen? Please advise.

## 2018-12-27 NOTE — Telephone Encounter (Signed)
New Message   Pt c/o medication issue:  1. Name of Medication: rosuvastatin (CRESTOR) 5 MG tablet and fenofibrate (TRICOR) 145 MG tablet   2. How are you currently taking this medication (dosage and times per day)?   3. Are you having a reaction (difficulty breathing--STAT)?   4. What is your medication issue? Patient states that he stopped taking this medication on Sunday because it was causing joint pain. He says that he is feeling much better since he stopped taking them.

## 2018-12-27 NOTE — Telephone Encounter (Signed)
Please refer to the lipid clinic , thank you

## 2018-12-27 NOTE — Telephone Encounter (Signed)
Will call pt this week to discuss lipids.

## 2018-12-27 NOTE — Progress Notes (Signed)
Patient ID: [name] [name]                 DOB: 1946-06-10                    MRN: [id number]     HPI: [name] [name] is a 73 y.o. male patient referred to lipid clinic by Dr [name]. PMH is significant for hyperlipidemia, HTN, COPD, and prostate cancer. 10 year ASCVD risk is 20%. He used to stay very active and was running 3 miles daily about 10 years ago. Afterwards, he started to have joint problems and has had multiple surgeries that has led to significant deconditioning. Visit today conducted via telehealth due to COVID-19 outbreak.  Patient reports trying multiple statins in the past. He had been taking rosuvastatin 5mg  daily most recently for the past few years. Over the past few months, he noticed that his legs started to ache which gradually became worse until he had difficulty walking. He stopped taking his rosuvastatin and fenofibrate last week as he was not sure which was causing his symptoms. His myalgias have since resolved. He also experienced muscle pain previously on simvastatin and atorvastatin.  Current Medications: none Intolerances: fenofibrate 145mg  and 160mg , rosuvastatin 5mg  and 10mg  daily, simvastatin 40mg  daily, atorvastatin Risk Factors: family history of CAD, 10 year ASCVD risk score of 20% LDL goal: 100mg /dL  Diet: Was following low carb diet until last Thanksgiving. Eats lean meats and fish, occasional beef and pork. Tries to avoid breads. Does eat some fast food, easier to get during [place].  Exercise: Former marine, previously very active. Has undergone surgeries due to joint problems.  Family History: Uncle with heart disease in his 70s, mother with dementia and heart failure, father and brother with cancer.  Social History: Former smoker 1 PPD for 20 years, quit in 1997. Drinks 2 cans of beer per week, denies illicit drug use.  Labs: 04/28/18: TC 123, TG 97, HDL 50, LDL 54 (rosuvastatin 5mg  daily)  Past Medical History:  Diagnosis Date  . Asthma, chronic  06/02/2013  . Carpal tunnel syndrome 04/17/2015   Bilateral  . Depression 06/02/2013  . DYSPNEA ON EXERTION 06/04/2009   Qualifier: Diagnosis of  By: Domenic Polite, MD, [place] [facility]   . GERD (gastroesophageal reflux disease)   . Hereditary and idiopathic peripheral neuropathy 04/17/2015  . Hyperlipemia 06/02/2013  . LEG PAIN, BILATERAL 06/04/2009   Qualifier: Diagnosis of  By: Domenic Polite, MD, [place] [facility]   . PERCUTANEOUS TRANSLUMINAL CORONARY ANGIOPLASTY, HX OF 06/04/2009   Qualifier: Diagnosis of  By: Domenic Polite, MD, [place] [facility]   . Prostate CA [facility])     Current Outpatient Medications on File Prior to Visit  Medication Sig Dispense Refill  . amLODipine (NORVASC) 5 MG tablet Take 1 tablet (5 mg total) by mouth daily. 90 tablet 3  . aspirin EC 81 MG tablet Take 81 mg by mouth daily.    Marland Kitchen atenolol (TENORMIN) 25 MG tablet Take 1 tablet (25 mg total) by mouth daily. 90 tablet 3  . budesonide-formoterol (SYMBICORT) 160-4.5 MCG/ACT inhaler Inhale 2 puffs into the lungs 2 (two) times daily. 3 Inhaler 3  . cetirizine (ZYRTEC) 10 MG tablet Take 10 mg by mouth daily as needed.     . cholecalciferol (VITAMIN D) 1000 UNITS tablet Take 1,000 Units by mouth daily.    . citalopram (CELEXA) 20 MG tablet Take 1 tablet (20 mg total) by mouth daily. 90 tablet 1  . cyanocobalamin (,VITAMIN  B-12,) 1000 MCG/ML injection INJECT 1ML EVERY MONTH 3 mL 5  . loperamide (IMODIUM) 2 MG capsule Take 2 mg by mouth daily.    . montelukast (SINGULAIR) 10 MG tablet Take 1 tablet (10 mg total) by mouth daily. 90 tablet 1  . Multiple Vitamins-Minerals (MULTIVITAMIN WITH MINERALS) tablet Take 1 tablet by mouth daily.    . naproxen (NAPROSYN) 500 MG tablet TAKE  (1)  TABLET TWICE A DAY WITH FOOD. (Patient taking differently: Take 500 mg by mouth daily. ) 60 tablet 5  . sildenafil (REVATIO) 20 MG tablet Take 20 mg by mouth 3 (three) times daily as needed.     No current facility-administered medications on file  prior to visit.     Allergies  Allergen Reactions  . Fenofibrate Other (See Comments)    Pt reports causes joint pains  . Rosuvastatin Other (See Comments)    Pt reports causes joint pains    Assessment/Plan:  1. Hyperlipidemia - LDL goal 100mg /dL due to primary prevention with elevated 10 year ASCVD risk of 20% and family history of CAD. He is intolerant to rosuvastatin, atorvastatin, and simvastatin. Discussed rechallenge with low dose pravastatin vs Zetia with preference for pravastatin due to CV benefit with statin therapy. Pt is agreeable to starting pravastatin 10mg  every evening. He will call clinic if he notices any myalgias prior to then, and we can try pt on Zetia instead. Will not resume fibrate therapy at this time due to controlled TG < 100 on last lipid panel. Pt will focus on low carb and low saturated fat diet. Will recheck lipids and LFTs in August prior to f/u with Dr [name].    E. Supple, PharmD, BCACP, [place] [address] [place], [place] [address] Phone: [phone]; Fax: [phone] 12/28/2018 10:19 AM

## 2018-12-27 NOTE — Telephone Encounter (Signed)
[name] Few, LPN        Patient opted to wait until August. He is scheduled for the 27th   Previous Messages    ----- Message -----  From: Nuala Alpha, LPN  Sent: 0/10/8900  9:42 AM EDT  To: Rivka Barbara  Subject: sched for virtual w [name]            This pt was to see [name] in March 30.  He needs a virtual visit in April.  He will more than likely agree but if he doesn't tell him live pts are seen in august.  Let me know and I will remove him from the covid cancel pool.   Thanks,  [facility]

## 2018-12-27 NOTE — Telephone Encounter (Signed)
Spoke with the pt and informed him that per Dr [name], we will d/c his rosuvastatin and fenofibrate, update these as an allergy-intolerance to statins, and refer him to our [facility] for further evaluation for known statin intolerance.  Informed the pt that our lipid clinic Pharmacist will more than likely arrange a virtual office visit with him.  Informed the pt that I will place the referral in the system and send a message to our lipid clinic to arrange a virtual office visit appt with him, for known statin intolerance. Pt verbalized understanding and agrees with this plan.

## 2018-12-27 NOTE — Telephone Encounter (Signed)
RE: refer to lipid clinic per Dr [name] for statin intolerance and CAD  Received: Today  Message Contents  Supple, Harlon Flor [facility]  Nuala Alpha, LPN; Erskine Emery, RPH; Marcelle Overlie D, RPH        I will give him a call this week to discuss lipids.   Thanks for the referral,  Jinny Blossom

## 2018-12-27 NOTE — Telephone Encounter (Signed)
Left the pt a message to call the office back to endorse recommendations per Dr [name].

## 2018-12-28 MED ORDER — PRAVASTATIN SODIUM 20 MG PO TABS: 10.0000 mg | ORAL_TABLET | Freq: Every evening | ORAL | 11 refills | Status: DC

## 2019-01-26 DIAGNOSIS — H25811 Combined forms of age-related cataract, right eye: Secondary | ICD-10-CM | POA: Diagnosis not present

## 2019-01-26 DIAGNOSIS — H2511 Age-related nuclear cataract, right eye: Secondary | ICD-10-CM | POA: Diagnosis not present

## 2019-01-27 DIAGNOSIS — H25042 Posterior subcapsular polar age-related cataract, left eye: Secondary | ICD-10-CM | POA: Diagnosis not present

## 2019-02-16 DIAGNOSIS — H2512 Age-related nuclear cataract, left eye: Secondary | ICD-10-CM | POA: Diagnosis not present

## 2019-02-16 DIAGNOSIS — H25812 Combined forms of age-related cataract, left eye: Secondary | ICD-10-CM | POA: Diagnosis not present

## 2019-03-20 ENCOUNTER — Other Ambulatory Visit: Payer: Self-pay | Admitting: Nurse Practitioner

## 2019-03-21 ENCOUNTER — Telehealth: Payer: Self-pay | Admitting: Physical Medicine and Rehabilitation

## 2019-03-21 NOTE — Telephone Encounter (Signed)
He unfortunately has complicated.  We have completed radiofrequency ablation I think it be good reviewed his case.  We went on to may be complete facet joint blocks after that so he could ask him, what is going on but I am happy to repeat if we need to.

## 2019-03-22 NOTE — Telephone Encounter (Signed)
Scheduled for 7/23 with driver.

## 2019-03-27 ENCOUNTER — Other Ambulatory Visit: Payer: Self-pay | Admitting: Nurse Practitioner

## 2019-03-27 NOTE — Telephone Encounter (Signed)
Patient has received B12 injection this month and will be due for next the 1st of august.  Sooner appt made to have b12 levels drawn

## 2019-04-06 ENCOUNTER — Ambulatory Visit (INDEPENDENT_AMBULATORY_CARE_PROVIDER_SITE_OTHER): Payer: Medicare Other | Admitting: Physical Medicine and Rehabilitation

## 2019-04-06 ENCOUNTER — Other Ambulatory Visit: Payer: Self-pay

## 2019-04-06 ENCOUNTER — Ambulatory Visit: Payer: Self-pay

## 2019-04-06 ENCOUNTER — Encounter: Payer: Self-pay | Admitting: Physical Medicine and Rehabilitation

## 2019-04-06 VITALS — BP 145/68 | HR 59

## 2019-04-06 DIAGNOSIS — M47816 Spondylosis without myelopathy or radiculopathy, lumbar region: Secondary | ICD-10-CM

## 2019-04-06 MED ORDER — METHYLPREDNISOLONE ACETATE 80 MG/ML IJ SUSP
80.0000 mg | Freq: Once | INTRAMUSCULAR | Status: AC
  Administered 2019-04-06: 80 mg

## 2019-04-06 NOTE — Progress Notes (Signed)
 .  Numeric Pain Rating Scale and Functional Assessment Average Pain 8   In the last MONTH (on 0-10 scale) has pain interfered with the following?  1. General activity like being  able to carry out your everyday physical activities such as walking, climbing stairs, carrying groceries, or moving a chair?  Rating(7)   +Driver, -BT, -Dye Allergies.  

## 2019-04-07 NOTE — Progress Notes (Signed)
[name] [name] - 73 y.o. male MRN [id number]  Date of birth: [birth year]  Office Visit Note: Visit Date: 04/06/2019 PCP: Chevis Pretty, FNP Referred by: Hassell Done, Mary-Margaret, *  Subjective: Chief Complaint  Patient presents with   Lower Back - Pain   Right Leg - Pain   HPI:  [name] [name] is a 73 y.o. male who comes in today For planned repeat L4-5 facet joint block.  Patient had prior injection in December and got quite a bit of relief of his back and hip pain.  It appears that his hip pain on the right may be declaring itself is a little bit more of an L4 radicular pain.  His biggest complaint however is his low back.  We will complete the facet joint blocks.  He has had no trauma or any other issues no focal weakness no red flag complaints.  Depending on relief however I would look at repeating lumbar spine MRI as his last MRI was from 2013.  Would consider transforaminal injection at that time.  ROS Otherwise per HPI.  Assessment & Plan: Visit Diagnoses:  1. Spondylosis without myelopathy or radiculopathy, lumbar region     Plan: No additional findings.   Meds & Orders:  Meds ordered this encounter  Medications   methylPREDNISolone acetate (DEPO-MEDROL) injection 80 mg    Orders Placed This Encounter  Procedures   Facet Injection   XR C-ARM NO REPORT    Follow-up: Return if symptoms worsen or fail to improve, for Consider lumbar spine MRI.   Procedures: No procedures performed  Lumbar Facet Joint Intra-Articular Injection(s) with Fluoroscopic Guidance  Patient: [name] [name]      Date of Birth: 1945/12/09 MRN: [id number] PCP: Chevis Pretty, FNP      Visit Date: 04/06/2019   Universal Protocol:    Date/Time: 04/06/2019  Consent Given By: the patient  Position: PRONE   Additional Comments: Vital signs were monitored before and after the procedure. Patient was prepped and draped in the usual sterile fashion. The correct patient,  procedure, and site was verified.   Injection Procedure Details:  Procedure Site One Meds Administered:  Meds ordered this encounter  Medications   methylPREDNISolone acetate (DEPO-MEDROL) injection 80 mg     Laterality: Bilateral  Location/Site:  L4-L5  Needle size: 22 guage  Needle type: Spinal  Needle Placement: Articular  Findings:  -Comments: Excellent flow of contrast producing a partial arthrogram.  Procedure Details: The fluoroscope beam is vertically oriented in AP, and the inferior recess is visualized beneath the lower pole of the inferior apophyseal process, which represents the target point for needle insertion. When direct visualization is difficult the target point is located at the medial projection of the vertebral pedicle. The region overlying each aforementioned target is locally anesthetized with a 1 to 2 ml. volume of 1% Lidocaine without Epinephrine.   The spinal needle was inserted into each of the above mentioned facet joints using biplanar fluoroscopic guidance. A 0.25 to 0.5 ml. volume of Isovue-250 was injected and a partial facet joint arthrogram was obtained. A single spot film was obtained of the resulting arthrogram.    One to 1.25 ml of the steroid/anesthetic solution was then injected into each of the facet joints noted above.   Additional Comments:  The patient tolerated the procedure well Dressing: 2 x 2 sterile gauze and Band-Aid    Post-procedure details: Patient was observed during the procedure. Post-procedure instructions were reviewed.  Patient left the clinic in  stable condition.    Clinical History: Lumbar Spine SOS MRI 12/28/2011  L4-5 facet arthropathy, mild bulging, degenerative changes     Objective:  VS:  HT:     WT:    BMI:      BP:(!) 145/68   HR:(!) 59bpm   TEMP: ( )   RESP:  Physical Exam  Ortho Exam Imaging: Xr C-arm No Report  Result Date: 04/06/2019 Please see Notes tab for imaging impression.

## 2019-04-07 NOTE — Procedures (Signed)
Lumbar Facet Joint Intra-Articular Injection(s) with Fluoroscopic Guidance  Patient: [name] [name]      Date of Birth: 20-Dec-1945 MRN: [id number] PCP: Chevis Pretty, FNP      Visit Date: 04/06/2019   Universal Protocol:    Date/Time: 04/06/2019  Consent Given By: the patient  Position: PRONE   Additional Comments: Vital signs were monitored before and after the procedure. Patient was prepped and draped in the usual sterile fashion. The correct patient, procedure, and site was verified.   Injection Procedure Details:  Procedure Site One Meds Administered:  Meds ordered this encounter  Medications  . methylPREDNISolone acetate (DEPO-MEDROL) injection 80 mg     Laterality: Bilateral  Location/Site:  L4-L5  Needle size: 22 guage  Needle type: Spinal  Needle Placement: Articular  Findings:  -Comments: Excellent flow of contrast producing a partial arthrogram.  Procedure Details: The fluoroscope beam is vertically oriented in AP, and the inferior recess is visualized beneath the lower pole of the inferior apophyseal process, which represents the target point for needle insertion. When direct visualization is difficult the target point is located at the medial projection of the vertebral pedicle. The region overlying each aforementioned target is locally anesthetized with a 1 to 2 ml. volume of 1% Lidocaine without Epinephrine.   The spinal needle was inserted into each of the above mentioned facet joints using biplanar fluoroscopic guidance. A 0.25 to 0.5 ml. volume of Isovue-250 was injected and a partial facet joint arthrogram was obtained. A single spot film was obtained of the resulting arthrogram.    One to 1.25 ml of the steroid/anesthetic solution was then injected into each of the facet joints noted above.   Additional Comments:  The patient tolerated the procedure well Dressing: 2 x 2 sterile gauze and Band-Aid    Post-procedure details: Patient was  observed during the procedure. Post-procedure instructions were reviewed.  Patient left the clinic in stable condition.

## 2019-04-10 ENCOUNTER — Other Ambulatory Visit: Payer: Self-pay

## 2019-04-10 ENCOUNTER — Encounter: Payer: Self-pay | Admitting: Nurse Practitioner

## 2019-04-10 ENCOUNTER — Ambulatory Visit (INDEPENDENT_AMBULATORY_CARE_PROVIDER_SITE_OTHER): Payer: Medicare Other | Admitting: Nurse Practitioner

## 2019-04-10 VITALS — BP 135/74 | HR 53 | Temp 96.8°F | Ht 75.0 in | Wt 240.0 lb

## 2019-04-10 DIAGNOSIS — F3342 Major depressive disorder, recurrent, in full remission: Secondary | ICD-10-CM

## 2019-04-10 DIAGNOSIS — E782 Mixed hyperlipidemia: Secondary | ICD-10-CM

## 2019-04-10 DIAGNOSIS — K219 Gastro-esophageal reflux disease without esophagitis: Secondary | ICD-10-CM | POA: Diagnosis not present

## 2019-04-10 DIAGNOSIS — Z6828 Body mass index (BMI) 28.0-28.9, adult: Secondary | ICD-10-CM | POA: Diagnosis not present

## 2019-04-10 DIAGNOSIS — Z125 Encounter for screening for malignant neoplasm of prostate: Secondary | ICD-10-CM | POA: Diagnosis not present

## 2019-04-10 DIAGNOSIS — I10 Essential (primary) hypertension: Secondary | ICD-10-CM

## 2019-04-10 DIAGNOSIS — J4520 Mild intermittent asthma, uncomplicated: Secondary | ICD-10-CM | POA: Diagnosis not present

## 2019-04-10 DIAGNOSIS — Z9861 Coronary angioplasty status: Secondary | ICD-10-CM

## 2019-04-10 MED ORDER — MONTELUKAST SODIUM 10 MG PO TABS: 10.0000 mg | ORAL_TABLET | Freq: Every day | ORAL | 1 refills | Status: DC

## 2019-04-10 MED ORDER — AMLODIPINE BESYLATE 5 MG PO TABS: 5.0000 mg | ORAL_TABLET | Freq: Every day | ORAL | 3 refills | Status: DC

## 2019-04-10 MED ORDER — ATENOLOL 25 MG PO TABS: 25.0000 mg | ORAL_TABLET | Freq: Every day | ORAL | 3 refills | Status: DC

## 2019-04-10 MED ORDER — CITALOPRAM HYDROBROMIDE 20 MG PO TABS: 20.0000 mg | ORAL_TABLET | Freq: Every day | ORAL | 1 refills | Status: DC

## 2019-04-10 MED ORDER — BUDESONIDE-FORMOTEROL FUMARATE 160-4.5 MCG/ACT IN AERO: 2.0000 | INHALATION_SPRAY | Freq: Two times a day (BID) | RESPIRATORY_TRACT | 3 refills | Status: DC

## 2019-04-10 MED ORDER — CYANOCOBALAMIN 1000 MCG/ML IJ SOLN: 1000.0000 ug | INTRAMUSCULAR | 5 refills | Status: DC

## 2019-04-10 MED ORDER — PRAVASTATIN SODIUM 20 MG PO TABS: 10.0000 mg | ORAL_TABLET | Freq: Every evening | ORAL | 0 refills | Status: DC

## 2019-04-10 NOTE — Patient Instructions (Signed)

## 2019-04-10 NOTE — Addendum Note (Signed)
Addended by: Chevis Pretty on: 04/10/2019 01:03 PM   Modules accepted: Orders

## 2019-04-10 NOTE — Progress Notes (Signed)
Subjective:    Patient ID: [name] [name], male    DOB: [birth year], 73 y.o.   MRN: [id number]   Chief Complaint: Medical Management of Chronic Issues    HPI:  1. Essential hypertension No c/o chest pain, sob or headache. Does not check blood pressure at home. BP Readings from Last 3 Encounters:  04/10/19 135/74  04/06/19 (!) 145/68  11/01/18 130/66     2. Mixed hyperlipidemia Tries to watch diet and does very little exercise.  3. PERCUTANEOUS TRANSLUMINAL CORONARY ANGIOPLASTY, HX OF He has follow up appointment with cardiology next week. Is doing well,\ denies fatigue.  4. Gastroesophageal reflux disease, esophagitis presence not specified Is not n any rx meds right  Now. He uses OTC meds when needed.  5. Mild intermittent chronic asthma without complication Is on symbicort daily along with singular  6. Recurrent major depressive disorder, in full remission ([place]) Is on celexa daily and is doing well. Depression screen [facility] 2/9 04/10/2019 11/01/2018 10/20/2018  Decreased Interest 0 0 0  Down, Depressed, Hopeless 0 0 0  PHQ - 2 Score 0 0 0  Altered sleeping - - -  Tired, decreased energy - - -  Change in appetite - - -  Feeling bad or failure about yourself  - - -  Trouble concentrating - - -  Moving slowly or fidgety/restless - - -  Suicidal thoughts - - -  PHQ-9 Score - - -     7. BMI 28.0-28.9,adult No recent weight changes    Outpatient Encounter Medications as of 04/10/2019  Medication Sig  . amLODipine (NORVASC) 5 MG tablet Take 1 tablet (5 mg total) by mouth daily.  Marland Kitchen aspirin EC 81 MG tablet Take 81 mg by mouth daily.  Marland Kitchen atenolol (TENORMIN) 25 MG tablet Take 1 tablet (25 mg total) by mouth daily.  . budesonide-formoterol (SYMBICORT) 160-4.5 MCG/ACT inhaler Inhale 2 puffs into the lungs 2 (two) times daily.  . cetirizine (ZYRTEC) 10 MG tablet Take 10 mg by mouth daily as needed.   . cholecalciferol (VITAMIN D) 1000 UNITS tablet Take 1,000 Units by mouth  daily.  . citalopram (CELEXA) 20 MG tablet Take 1 tablet (20 mg total) by mouth daily.  . cyanocobalamin (,VITAMIN B-12,) 1000 MCG/ML injection INJECT 1ML EVERY MONTH  . loperamide (IMODIUM) 2 MG capsule Take 2 mg by mouth daily.  . montelukast (SINGULAIR) 10 MG tablet Take 1 tablet (10 mg total) by mouth daily.  . Multiple Vitamins-Minerals (MULTIVITAMIN WITH MINERALS) tablet Take 1 tablet by mouth daily.  . naproxen (NAPROSYN) 500 MG tablet TAKE  (1)  TABLET TWICE A DAY WITH FOOD. (Patient taking differently: Take 500 mg by mouth daily. )  . pravastatin (PRAVACHOL) 20 MG tablet Take 0.5 tablets (10 mg total) by mouth every evening.  . sildenafil (REVATIO) 20 MG tablet Take 20 mg by mouth 3 (three) times daily as needed.     Past Surgical History:  Procedure Laterality Date  . CARDIAC CATHETERIZATION  1997  . HIATAL HERNIA REPAIR    . LAPAROSCOPIC GASTRIC BANDING  2000   [facility]   . ROBOT ASSISTED LAPAROSCOPIC RADICAL PROSTATECTOMY      Family History  Problem Relation Age of Onset  . Heart disease Other        Early Cardiac disease for uncle is their 66's as well as deceased.  Marland Kitchen Heart failure Mother   . Dementia Mother 55  . Cancer Father 97       testicular,  lung  . Esophageal cancer Brother     New complaints: None today  Social history: Lives with wife and they are both retired  Controlled substance contract: n/A    Review of Systems  Constitutional: Negative for activity change and appetite change.  HENT: Negative.   Eyes: Negative for pain.  Respiratory: Negative for shortness of breath.   Cardiovascular: Negative for chest pain, palpitations and leg swelling.  Gastrointestinal: Negative for abdominal pain.  Endocrine: Negative for polydipsia.  Genitourinary: Negative.   Skin: Negative for rash.  Neurological: Negative for dizziness, weakness and headaches.  Hematological: Does not bruise/bleed easily.  Psychiatric/Behavioral: Negative.   All other systems  reviewed and are negative.      Objective:   Physical Exam Vitals signs and nursing note reviewed.  Constitutional:      Appearance: Normal appearance. He is well-developed.  HENT:     Head: Normocephalic.     Nose: Nose normal.  Eyes:     Pupils: Pupils are equal, round, and reactive to light.  Neck:     Musculoskeletal: Normal range of motion and neck supple.     Thyroid: No thyroid mass or thyromegaly.     Vascular: No carotid bruit or JVD.     Trachea: Phonation normal.  Cardiovascular:     Rate and Rhythm: Normal rate and regular rhythm.  Pulmonary:     Effort: Pulmonary effort is normal. No respiratory distress.     Breath sounds: Normal breath sounds.  Abdominal:     General: Bowel sounds are normal.     Palpations: Abdomen is soft.     Tenderness: There is no abdominal tenderness.  Musculoskeletal: Normal range of motion.  Lymphadenopathy:     Cervical: No cervical adenopathy.  Skin:    General: Skin is warm and dry.  Neurological:     Mental Status: He is alert and oriented to person, place, and time.  Psychiatric:        Behavior: Behavior normal.        Thought Content: Thought content normal.        Judgment: Judgment normal.    BP 135/74   Pulse (!) 53   Temp (!) 96.8 F (36 C) (Oral)   Ht 6\' 3"  (1.905 m)   Wt 240 lb (108.9 kg)   BMI 30.00 kg/m         Assessment & Plan:  [name] [name] comes in today with chief complaint of Medical Management of Chronic Issues   Diagnosis and orders addressed:  1. Essential hypertension Low sodium diet - amLODipine (NORVASC) 5 MG tablet; Take 1 tablet (5 mg total) by mouth daily.  Dispense: 90 tablet; Refill: 3 - atenolol (TENORMIN) 25 MG tablet; Take 1 tablet (25 mg total) by mouth daily.  Dispense: 90 tablet; Refill: 3 - budesonide-formoterol (SYMBICORT) 160-4.5 MCG/ACT inhaler; Inhale 2 puffs into the lungs 2 (two) times daily.  Dispense: 3 Inhaler; Refill: 3  2. Mixed hyperlipidemia Low fat diet  -  pravastatin (PRAVACHOL) 20 MG tablet; Take 0.5 tablets (10 mg total) by mouth every evening.  Dispense: 90 tablet; Refill: 0  3. PERCUTANEOUS TRANSLUMINAL CORONARY ANGIOPLASTY, HX OF Keep follow with cardiology  4. Gastroesophageal reflux disease, esophagitis presence not specified Avoid spicy foods Do not eat 2 hours prior to bedtime  5. Mild intermittent chronic asthma without complication - montelukast (SINGULAIR) 10 MG tablet; Take 1 tablet (10 mg total) by mouth daily.  Dispense: 90 tablet; Refill: 1  6.  Recurrent major depressive disorder, in full remission ([place]) Stress management - citalopram (CELEXA) 20 MG tablet; Take 1 tablet (20 mg total) by mouth daily.  Dispense: 90 tablet; Refill: 1  7. BMI 28.0-28.9,adult Discussed diet and exercise for person with BMI >25 Will recheck weight in 3-6 months   Labs pending Health Maintenance reviewed Diet and exercise encouraged  Follow up plan: 6 months   Mary-Margaret Hassell Done, FNP

## 2019-04-11 LAB — CMP14+EGFR
ALT: 25 IU/L (ref 0–44)
AST: 19 IU/L (ref 0–40)
Albumin/Globulin Ratio: 1.6 (ref 1.2–2.2)
Albumin: 4.4 g/dL (ref 3.7–4.7)
Alkaline Phosphatase: 72 IU/L (ref 39–117)
BUN/Creatinine Ratio: 19 (ref 10–24)
BUN: 23 mg/dL (ref 8–27)
Bilirubin Total: 0.4 mg/dL (ref 0.0–1.2)
CO2: 23 mmol/L (ref 20–29)
Calcium: 10.1 mg/dL (ref 8.6–10.2)
Chloride: 99 mmol/L (ref 96–106)
Creatinine, Ser: 1.23 mg/dL (ref 0.76–1.27)
GFR calc Af Amer: 67 mL/min/{1.73_m2} (ref >59)
GFR calc non Af Amer: 58 mL/min/{1.73_m2} — ABNORMAL LOW (ref >59)
Globulin, Total: 2.8 g/dL (ref 1.5–4.5)
Glucose: 89 mg/dL (ref 65–99)
Potassium: 5 mmol/L (ref 3.5–5.2)
Sodium: 141 mmol/L (ref 134–144)
Total Protein: 7.2 g/dL (ref 6.0–8.5)

## 2019-04-11 LAB — PSA, TOTAL AND FREE
PSA, Free: <0.02 ng/mL
Prostate Specific Ag, Serum: <0.1 ng/mL (ref 0.0–4.0)

## 2019-04-11 LAB — LIPID PANEL
Chol/HDL Ratio: 3 ratio (ref 0.0–5.0)
Cholesterol, Total: 157 mg/dL (ref 100–199)
HDL: 53 mg/dL (ref >39)
LDL Calculated: 81 mg/dL (ref 0–99)
Triglycerides: 115 mg/dL (ref 0–149)
VLDL Cholesterol Cal: 23 mg/dL (ref 5–40)

## 2019-04-24 ENCOUNTER — Other Ambulatory Visit: Payer: Self-pay

## 2019-04-24 ENCOUNTER — Other Ambulatory Visit: Payer: Self-pay | Admitting: Cardiology

## 2019-04-24 ENCOUNTER — Other Ambulatory Visit: Payer: Medicare Other | Admitting: *Deleted

## 2019-04-24 DIAGNOSIS — Z789 Other specified health status: Secondary | ICD-10-CM

## 2019-04-24 DIAGNOSIS — E782 Mixed hyperlipidemia: Secondary | ICD-10-CM | POA: Diagnosis not present

## 2019-04-24 LAB — HEPATIC FUNCTION PANEL
ALT: 22 IU/L (ref 0–44)
AST: 21 IU/L (ref 0–40)
Albumin: 4.5 g/dL (ref 3.7–4.7)
Alkaline Phosphatase: 70 IU/L (ref 39–117)
Bilirubin Total: 0.4 mg/dL (ref 0.0–1.2)
Bilirubin, Direct: 0.13 mg/dL (ref 0.00–0.40)
Total Protein: 6.9 g/dL (ref 6.0–8.5)

## 2019-04-24 LAB — LIPID PANEL
Chol/HDL Ratio: 3.1 ratio (ref 0.0–5.0)
Cholesterol, Total: 163 mg/dL (ref 100–199)
HDL: 53 mg/dL (ref >39)
LDL Calculated: 85 mg/dL (ref 0–99)
Triglycerides: 124 mg/dL (ref 0–149)
VLDL Cholesterol Cal: 25 mg/dL (ref 5–40)

## 2019-04-28 ENCOUNTER — Telehealth: Payer: Self-pay | Admitting: *Deleted

## 2019-04-28 NOTE — Telephone Encounter (Signed)
[name] [name], Can you call patient and tell him his labs look great and to continue current medications? Thanks

## 2019-04-28 NOTE — Telephone Encounter (Signed)
Dr. Radford Pax Patient is primary prevention. We previously had his LDL goal to be <100. Current LDL is 86. Did you want [place] to be more aggressive?

## 2019-04-28 NOTE — Telephone Encounter (Signed)
DPR ok to lmom. Left message per Ramond Dial, RPH 43 minutes ago (12:45 PM)  Arbie Cookey, Can you call patient and tell him his labs look great and to continue current medications? Thanks. Please call the office [phone] if you have any questions.

## 2019-04-28 NOTE — Telephone Encounter (Signed)
Sorry I was covering Dr. Francesca Oman basket and thought his goal was < 13.  OK to continue on current therapy

## 2019-04-28 NOTE — Telephone Encounter (Signed)
-----   Message from Nuala Alpha, LPN sent at 7/67/3419  1:12 PM EDT -----  ----- Message ----- From: Sueanne Margarita, MD Sent: 04/27/2019   1:12 PM EDT To: Nuala Alpha, LPN  Lipids still not at goal.  Please forward to lipid clinic for further recommendations.

## 2019-05-02 ENCOUNTER — Ambulatory Visit: Payer: Medicare Other | Admitting: Nurse Practitioner

## 2019-05-02 DIAGNOSIS — Z85828 Personal history of other malignant neoplasm of skin: Secondary | ICD-10-CM | POA: Diagnosis not present

## 2019-05-02 DIAGNOSIS — L820 Inflamed seborrheic keratosis: Secondary | ICD-10-CM | POA: Diagnosis not present

## 2019-05-02 DIAGNOSIS — L579 Skin changes due to chronic exposure to nonionizing radiation, unspecified: Secondary | ICD-10-CM | POA: Diagnosis not present

## 2019-05-02 DIAGNOSIS — L814 Other melanin hyperpigmentation: Secondary | ICD-10-CM | POA: Diagnosis not present

## 2019-05-02 DIAGNOSIS — D1801 Hemangioma of skin and subcutaneous tissue: Secondary | ICD-10-CM | POA: Diagnosis not present

## 2019-05-11 ENCOUNTER — Other Ambulatory Visit: Payer: Self-pay

## 2019-05-11 ENCOUNTER — Encounter: Payer: Self-pay | Admitting: Cardiology

## 2019-05-11 ENCOUNTER — Ambulatory Visit (INDEPENDENT_AMBULATORY_CARE_PROVIDER_SITE_OTHER): Payer: Medicare Other | Admitting: Cardiology

## 2019-05-11 VITALS — BP 125/68 | HR 62 | Ht 74.5 in | Wt 245.2 lb

## 2019-05-11 DIAGNOSIS — E782 Mixed hyperlipidemia: Secondary | ICD-10-CM | POA: Diagnosis not present

## 2019-05-11 DIAGNOSIS — I10 Essential (primary) hypertension: Secondary | ICD-10-CM

## 2019-05-11 NOTE — Progress Notes (Signed)
Patient ID: ATHEL ELBERS, male   DOB: 12-07-1945, 73 y.o.   MRN: [id number]    Patient Name: [name] [name] Date of Encounter: 05/11/2019  Primary Care Provider:  Chevis Pretty, FNP Primary Cardiologist:  Ena Dawley  Reason for visit: 1 year follow up  Problem List   Past Medical History:  Diagnosis Date  . Asthma, chronic 06/02/2013  . Carpal tunnel syndrome 04/17/2015   Bilateral  . Depression 06/02/2013  . DYSPNEA ON EXERTION 06/04/2009   Qualifier: Diagnosis of  By: Domenic Polite, MD, Phillips Hay   . GERD (gastroesophageal reflux disease)   . Hereditary and idiopathic peripheral neuropathy 04/17/2015  . Hyperlipemia 06/02/2013  . LEG PAIN, BILATERAL 06/04/2009   Qualifier: Diagnosis of  By: Domenic Polite, MD, Phillips Hay   . PERCUTANEOUS TRANSLUMINAL CORONARY ANGIOPLASTY, HX OF 06/04/2009   Qualifier: Diagnosis of  By: Domenic Polite, MD, Phillips Hay   . Prostate CA [facility])    Past Surgical History:  Procedure Laterality Date  . CARDIAC CATHETERIZATION  1997  . HIATAL HERNIA REPAIR    . LAPAROSCOPIC GASTRIC BANDING  2000   [facility]   . ROBOT ASSISTED LAPAROSCOPIC RADICAL PROSTATECTOMY     Allergies  Allergies  Allergen Reactions  . Fenofibrate Other (See Comments)    Pt reports causes joint pains  . Rosuvastatin Other (See Comments)    Pt reports causes joint pains    HPI  73 year old male with a history of COPD, hyperlipidemia, prior history of prostate cancer now in remission who was referred to [place] for a finding of atrial fibrillation but his primary care physician. The patient was previously very active is about 10 years ago he was running to 3 miles daily, afterwards he started to have joint problems and had multiple surgeries that led to significant deconditioning. The patient is stating that for at least the last year he felt significant shortness of breath, he walks every day 20 minutes to 1 hour any incline makes him gasp for breath. He denies  any palpitations. The patient is complaining of dizzy spells that has been increasing in frequency and he had one episode of near-syncope, after he was walking his dog. The patient is not aware of palpitations at that time. The patient isn't denies symptoms of orthopnea, paroxysmal nocturnal dyspnea, lower extremity edema.  09/03/2015 - 1 year follow-up. The patient denies any palpitations or syncope. He stays active, however doesn't exercise and eats out and eats fried food a lot. No LE edema, orthopnea, PND. He denies any chest pain or shortness of breath. He is complaint with his meds.  10/07/2016 -in December 2017 he had elevated LDL, but improved from 151 to 128, TG very elevated at 324, started on fenofibrate 130 mg po once daily. Normal LFTs, Crea 1.24. He is tolerating fenofibrate very well but had such bad side effects with statins that he doesn't want to start them again. He has been on a low-carb diet and has lost 10 pounds in the last year his goal is to lose another 40 pounds. 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 No hospitalizations since the last visit.  11/11/2017 - 1 year follow up, the patient feels well, he stays active, denies SOB, CP, no palpitations, dizziness or syncope. No LE edema, orthopnea, compliant with meds and has no side effects. He continues to work in his backyard  and walks his dog daily, no symptoms, he tolerates pravastatin well.   Home Medications  Prior to Admission medications   Medication Sig Start Date End Date Taking? Authorizing Provider  budesonide-formoterol (SYMBICORT) 160-4.5 MCG/ACT inhaler Inhale 2 puffs into the lungs 2 (two) times daily. 06/02/13   Mary-Margaret Hassell Done, FNP  citalopram (CELEXA) 20 MG tablet Take 1 tablet (20 mg total) by mouth daily. 06/02/13   Mary-Margaret Hassell Done, FNP  fenofibrate 160 MG tablet Take 1 tablet (160 mg total) by mouth daily.  06/02/13   Mary-Margaret Hassell Done, FNP  montelukast (SINGULAIR) 10 MG tablet Take 1 tablet (10 mg total) by mouth at bedtime. 06/02/13   Mary-Margaret Hassell Done, FNP  Naproxen Sodium (NAPRELAN) 750 MG TB24 Take 750 mg by mouth daily.    Historical Provider, MD  simvastatin (ZOCOR) 40 MG tablet Take 1 tablet (40 mg total) by mouth at bedtime. 06/02/13   Mary-Margaret Hassell Done, FNP    Family History  Family History  Problem Relation Age of Onset  . Heart disease Other        Early Cardiac disease for uncle is their 75's as well as deceased.  Marland Kitchen Heart failure Mother   . Dementia Mother 81  . Cancer Father 71       testicular, lung  . Esophageal cancer Brother     Social History  Social History   Socioeconomic History  . Marital status: Married    Spouse name: [name]  . Number of children: 3  . Years of education: Bachelors degree  . Highest education level: Bachelor's degree (e.g., BA, AB, BS)  Occupational History  . Occupation: Retired    Comment: Wellsite geologist  . Financial resource strain: Not hard at all  . Food insecurity    Worry: Never true    Inability: Never true  . Transportation needs    Medical: No    Non-medical: No  Tobacco Use  . Smoking status: Former Smoker    Packs/day: 1.00    Years: 20.00    Pack years: 20.00    Types: Cigarettes    Quit date: 01/19/1996    Years since quitting: 23.3  . Smokeless tobacco: Never Used  Substance and Sexual Activity  . Alcohol use: Yes    Alcohol/week: 2.0 standard drinks    Types: 2 Cans of beer per week  . Drug use: No  . Sexual activity: Yes  Lifestyle  . Physical activity    Days per week: 0 days    Minutes per session: 0 min  . Stress: Not at all  Relationships  . Social connections    Talks on phone: More than three times a week    Gets together: More than three times a week    Attends religious service: Never    Active member of club or organization: Yes    Attends meetings of clubs or organizations:  More than 4 times per year    Relationship status: Married  . Intimate partner violence    Fear of current or ex partner: No    Emotionally abused: No    Physically abused: No    Forced sexual activity: No  Other Topics Concern  . Not on file  Social History Narrative   Patient is married and lives at home with is wife. He has three adult children and five grandchildren. 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      Review of Systems, as per history of present  illness otherwise negative General:  No chills, fever, night sweats or weight changes.  Cardiovascular:  No chest pain, dyspnea on exertion, edema, orthopnea, palpitations, paroxysmal nocturnal dyspnea. Dermatological: No rash, lesions/masses Respiratory: No cough, dyspnea Urologic: No hematuria, dysuria Abdominal:   No nausea, vomiting, diarrhea, bright red blood per rectum, melena, or hematemesis Neurologic:  No visual changes, wkns, changes in mental status. All other systems reviewed and are otherwise negative except as noted above.  Physical Exam  BP 142/70, HR 70 BPM General: Pleasant, NAD Psych: Normal affect. Neuro: Alert and oriented X 3. Moves all extremities spontaneously. HEENT: Normal  Neck: Supple without bruits or JVD. Lungs:  Resp regular and unlabored, CTA. Heart: RRR no s3, s4, or murmurs. Abdomen: Soft, non-tender, non-distended, BS + x 4.  Extremities: No clubbing, cyanosis or edema. DP/PT/Radials 2+ and equal bilaterally.  Accessory Clinical Findings  ECG - sinus rhythm 61 beats per minute, normal EKG   TTE: 07/2013 Left ventricle: The cavity size was normal. Wall thickness was increased in a pattern of moderate LVH. Systolic function was normal. The estimated ejection fraction was in the range of 50% to 55%. Wall motion was normal; there were no regional wall motion abnormalities. Doppler parameters are consistent with abnormal left ventricular relaxation (grade 1 diastolic  dysfunction). Left atrium: The atrium was mildly dilated.  EKG- done today 11/11/17 shows SB, NSIVCD, unchanged from prior, personally reviewed   Assessment & Plan  1. Paroxysmal atrial fibrillation diagnosed in 2014 - no recurrent event, now in SR, E-cardio monitor showed 0% a-fib.  Echocardiogram shows mild LA enlargement. On aspirin 81 mg po daily for now. TSH normal.  2. Hypertension - repeat BP 125/ 68 mmHg. Continue the same regimen.   3. Hyperlipidemia - intolerant to simva- and atorvastatin, rosuvastatin, now able to tolerate low dose of pravastatin with all lipids at goal and normal LFTs.  He is encouraged to continue walking.  Follow up in 1 year.  Ena Dawley, MD 05/11/2019, 10:05 AM

## 2019-05-11 NOTE — Patient Instructions (Addendum)
Medication Instructions:   Your physician recommends that you continue on your current medications as directed. Please refer to the Current Medication list given to you today.  If you need a refill on your cardiac medications before your next appointment, please call your pharmacy.     Follow-Up: At [facility], you and your health needs are our priority.  As part of our continuing mission to provide you with exceptional heart care, we have created designated Provider Care Teams.  These Care Teams include your primary Cardiologist (physician) and Advanced Practice Providers (APPs -  Physician Assistants and Nurse Practitioners) who all work together to provide you with the care you need, when you need it. You will need a follow up appointment in 12 months Dr. [name].  Please call our office 2 months in advance to schedule this appointment.  You may see  or one of the following Advanced Practice Providers on your designated Care Team:   Lyda Jester, PA-C Melina Copa, PA-C . Ermalinda Barrios, PA-C

## 2019-05-15 ENCOUNTER — Telehealth: Payer: Self-pay | Admitting: Medical Oncology

## 2019-05-15 NOTE — Telephone Encounter (Signed)
err

## 2019-05-16 ENCOUNTER — Other Ambulatory Visit: Payer: Self-pay | Admitting: Internal Medicine

## 2019-06-13 ENCOUNTER — Other Ambulatory Visit: Payer: Self-pay

## 2019-06-20 ENCOUNTER — Other Ambulatory Visit: Payer: Self-pay

## 2019-06-21 ENCOUNTER — Ambulatory Visit (INDEPENDENT_AMBULATORY_CARE_PROVIDER_SITE_OTHER): Payer: Medicare Other

## 2019-06-21 DIAGNOSIS — Z23 Encounter for immunization: Secondary | ICD-10-CM

## 2019-06-26 ENCOUNTER — Ambulatory Visit (INDEPENDENT_AMBULATORY_CARE_PROVIDER_SITE_OTHER): Payer: Medicare Other | Admitting: Orthopaedic Surgery

## 2019-06-26 ENCOUNTER — Encounter: Payer: Self-pay | Admitting: Orthopaedic Surgery

## 2019-06-26 ENCOUNTER — Ambulatory Visit (INDEPENDENT_AMBULATORY_CARE_PROVIDER_SITE_OTHER): Payer: Medicare Other

## 2019-06-26 ENCOUNTER — Telehealth: Payer: Self-pay

## 2019-06-26 ENCOUNTER — Other Ambulatory Visit: Payer: Self-pay

## 2019-06-26 DIAGNOSIS — M25462 Effusion, left knee: Secondary | ICD-10-CM

## 2019-06-26 DIAGNOSIS — M25562 Pain in left knee: Secondary | ICD-10-CM

## 2019-06-26 DIAGNOSIS — M25561 Pain in right knee: Secondary | ICD-10-CM

## 2019-06-26 DIAGNOSIS — G8929 Other chronic pain: Secondary | ICD-10-CM | POA: Diagnosis not present

## 2019-06-26 DIAGNOSIS — M1712 Unilateral primary osteoarthritis, left knee: Secondary | ICD-10-CM

## 2019-06-26 DIAGNOSIS — M1711 Unilateral primary osteoarthritis, right knee: Secondary | ICD-10-CM | POA: Diagnosis not present

## 2019-06-26 NOTE — Telephone Encounter (Signed)
Bilateral knee gel injections 

## 2019-06-26 NOTE — Progress Notes (Signed)
Office Visit Note   Patient: [name] [name]           Date of Birth: June 09, 1946           MRN: [id number] Visit Date: 06/26/2019              Requested by: Chevis Pretty, [address] [place],  [place] [address] PCP: Chevis Pretty, FNP   Assessment & Plan: Visit Diagnoses:  1. Pain and swelling of left knee   2. Chronic pain of right knee   3. Unilateral primary osteoarthritis, left knee   4. Unilateral primary osteoarthritis, right knee     Plan: I recommended steroid injections for both knees today and follow this up in a month from now with hyaluronic acid injections to treat the pain from osteoarthritis.  I showed him his x-rays and went over knee model and explained in detail the disease process of osteoarthritis.  Today since he has no swelling we do need to aspirate his knee but we will certainly place a steroid injection in both knees.  He understands the risk and benefits of steroid injections and tolerated them well.  All question concerns were answered addressed.  Follow-Up Instructions: Return in about 4 weeks (around 07/24/2019).   Orders:  Orders Placed This Encounter  Procedures  . Large Joint Inj  . XR Knee 1-2 Views Left   No orders of the defined types were placed in this encounter.     Procedures: Large Joint Inj: bilateral knee on 06/26/2019 9:25 AM Indications: diagnostic evaluation and pain Details: 22 G 1.5 in needle, superolateral approach  Arthrogram: No  Outcome: tolerated well, no immediate complications Procedure, treatment alternatives, risks and benefits explained, specific risks discussed. Consent was given by the patient. Immediately prior to procedure a time out was called to verify the correct patient, procedure, equipment, support staff and site/side marked as required. Patient was prepped and draped in the usual sterile fashion.       Clinical Data: No additional findings.   Subjective: Chief Complaint   Patient presents with  . Left Knee - Follow-up  The patient comes in today with left knee pain and swelling.  He has known osteoarthritis in both knees.  He says his left knee swelled up a lot over the weekend but today has gone down.  He still having pain in both her knees today.  This is detriment affecting his mobility, his actives daily living and his quality of life.  HPI  Review of Systems He currently denies any headache, chest pain, shortness of breath, fever, chills, nausea, vomiting  Objective: Vital Signs: There were no vitals taken for this visit.  Physical Exam He is alert and orient x3 and in no acute distress Ortho Exam Examination of both knees shows a slight flexion contracture of the left knee comparing left and right knees.  Both knees have patellofemoral crepitation and are ligamentously stable.  Neither knee today has an effusion.  Both knees have varus malalignment is correctable. Specialty Comments:  No specialty comments available.  Imaging: Xr Knee 1-2 Views Left  Result Date: 06/26/2019 2 views of the left knee show moderate to severe arthritic changes with joint space narrowing, sclerotic changes and large periarticular osteophytes in all 3 compartments.    PMFS History: Patient Active Problem List   Diagnosis Date Noted  . Statin intolerance 12/27/2018  . BMI 28.0-28.9,adult 11/01/2018  . H/O arthroscopy of shoulder 02/10/2018  . Unilateral primary osteoarthritis, left  knee 11/25/2016  . Unilateral primary osteoarthritis, right knee 11/25/2016  . Essential hypertension 06/28/2015  . H/O prostate cancer 06/28/2015  . Hereditary and idiopathic peripheral neuropathy 04/17/2015  . Carpal tunnel syndrome 04/17/2015  . Hyperlipemia 06/02/2013  . Asthma, chronic 06/02/2013  . Depression 06/02/2013  . GERD 06/04/2009  . LEG PAIN, BILATERAL 06/04/2009  . PERCUTANEOUS TRANSLUMINAL CORONARY ANGIOPLASTY, HX OF 06/04/2009   Past Medical History:   Diagnosis Date  . Asthma, chronic 06/02/2013  . Carpal tunnel syndrome 04/17/2015   Bilateral  . Depression 06/02/2013  . DYSPNEA ON EXERTION 06/04/2009   Qualifier: Diagnosis of  By: Domenic Polite, MD, Phillips Hay   . GERD (gastroesophageal reflux disease)   . Hereditary and idiopathic peripheral neuropathy 04/17/2015  . Hyperlipemia 06/02/2013  . LEG PAIN, BILATERAL 06/04/2009   Qualifier: Diagnosis of  By: Domenic Polite, MD, Phillips Hay   . PERCUTANEOUS TRANSLUMINAL CORONARY ANGIOPLASTY, HX OF 06/04/2009   Qualifier: Diagnosis of  By: Domenic Polite, MD, Phillips Hay   . Prostate CA [facility])     Family History  Problem Relation Age of Onset  . Heart disease Other        Early Cardiac disease for uncle is their 65's as well as deceased.  Marland Kitchen Heart failure Mother   . Dementia Mother 62  . Cancer Father 69       testicular, lung  . Esophageal cancer Brother     Past Surgical History:  Procedure Laterality Date  . CARDIAC CATHETERIZATION  1997  . HIATAL HERNIA REPAIR    . LAPAROSCOPIC GASTRIC BANDING  2000   [facility]   . ROBOT ASSISTED LAPAROSCOPIC RADICAL PROSTATECTOMY     Social History   Occupational History  . Occupation: Retired    Comment: Nature conservation officer  Tobacco Use  . Smoking status: Former Smoker    Packs/day: 1.00    Years: 20.00    Pack years: 20.00    Types: Cigarettes    Quit date: 01/19/1996    Years since quitting: 23.4  . Smokeless tobacco: Never Used  Substance and Sexual Activity  . Alcohol use: Yes    Alcohol/week: 2.0 standard drinks    Types: 2 Cans of beer per week  . Drug use: No  . Sexual activity: Yes

## 2019-06-28 NOTE — Telephone Encounter (Signed)
Noted  

## 2019-07-03 ENCOUNTER — Telehealth: Payer: Self-pay

## 2019-07-03 NOTE — Telephone Encounter (Signed)
Submitted VOB for Monovisc, bilateral knee. 

## 2019-07-24 ENCOUNTER — Encounter: Payer: Self-pay | Admitting: Orthopaedic Surgery

## 2019-07-24 ENCOUNTER — Other Ambulatory Visit: Payer: Self-pay

## 2019-07-24 ENCOUNTER — Ambulatory Visit (INDEPENDENT_AMBULATORY_CARE_PROVIDER_SITE_OTHER): Payer: Medicare Other | Admitting: Orthopaedic Surgery

## 2019-07-24 DIAGNOSIS — M1712 Unilateral primary osteoarthritis, left knee: Secondary | ICD-10-CM | POA: Diagnosis not present

## 2019-07-24 DIAGNOSIS — M1711 Unilateral primary osteoarthritis, right knee: Secondary | ICD-10-CM

## 2019-07-24 MED ORDER — CROSS-LINK HYAL ACID (VISC) 30 MG/3ML IX PRSY
30.0000 mg | PREFILLED_SYRINGE | INTRA_ARTICULAR | Status: AC | PRN
  Administered 2019-07-24: 30 mg via INTRA_ARTICULAR

## 2019-07-24 NOTE — Progress Notes (Signed)
   Procedure Note  Patient: [name] [name]             Date of Birth: 10/30/1945           MRN: [id number]             Visit Date: 07/24/2019  Procedures: Visit Diagnoses:  1. Unilateral primary osteoarthritis, left knee   2. Unilateral primary osteoarthritis, right knee     Large Joint Inj: R knee on 07/24/2019 10:49 AM Indications: diagnostic evaluation and pain Details: 22 G 1.5 in needle, superolateral approach  Arthrogram: No  Medications: 30 mg Cross-Linked Hyaluronate 30 MG/3ML Outcome: tolerated well, no immediate complications Procedure, treatment alternatives, risks and benefits explained, specific risks discussed. Consent was given by the patient. Immediately prior to procedure a time out was called to verify the correct patient, procedure, equipment, support staff and site/side marked as required. Patient was prepped and draped in the usual sterile fashion.   Large Joint Inj: L knee on 07/24/2019 10:49 AM Indications: diagnostic evaluation and pain Details: 22 G 1.5 in needle, superolateral approach  Arthrogram: No  Medications: 30 mg Cross-Linked Hyaluronate 30 MG/3ML Outcome: tolerated well, no immediate complications Procedure, treatment alternatives, risks and benefits explained, specific risks discussed. Consent was given by the patient. Immediately prior to procedure a time out was called to verify the correct patient, procedure, equipment, support staff and site/side marked as required. Patient was prepped and draped in the usual sterile fashion.    The patient is here today to have hyaluronic acid injected into both his knees today.  This is a prescheduled visit.  He is having this done to treat the pain from osteoarthritis.  He has had gel injections successfully in the past.  Other treatment modalities including steroid injections have failed to provide relief.  He states that he has had no other changes in his medical status actively.  Both knees do hurt and  they swell on occasion.  Injections of continue to help so that is why he still having them.  Today will be with gel 1 in each knee.  On examination neither knee has any significant effusion.  Both knees have excellent range of motion but are painful with slight varus malalignment.  He tolerated the gel 1 injections well both knees.  All question concerns were answered and addressed.  Follow-up is as needed.

## 2019-08-15 ENCOUNTER — Telehealth: Payer: Self-pay | Admitting: Nurse Practitioner

## 2019-08-15 NOTE — Telephone Encounter (Signed)
Pt's wife called stating that they were told that pt was supposed to have a televisit today and have waited all day but have not been called. After looking at the appt desk, I explained to wife that pt's appt was scheduled for 08/22/19. Wife is upset and says pt needs to have an appt soon because he is sick and cant wait that long. Wants to speak with nurse.

## 2019-08-15 NOTE — Telephone Encounter (Signed)
appt fixed for tomorrow

## 2019-08-16 ENCOUNTER — Encounter: Payer: Self-pay | Admitting: Family Medicine

## 2019-08-16 ENCOUNTER — Ambulatory Visit (INDEPENDENT_AMBULATORY_CARE_PROVIDER_SITE_OTHER): Payer: Medicare Other | Admitting: Family Medicine

## 2019-08-16 DIAGNOSIS — R42 Dizziness and giddiness: Secondary | ICD-10-CM

## 2019-08-16 MED ORDER — MECLIZINE HCL 25 MG PO TABS: 25.0000 mg | ORAL_TABLET | Freq: Three times a day (TID) | ORAL | 0 refills | Status: DC | PRN

## 2019-08-16 NOTE — Progress Notes (Signed)
Virtual Visit via Telephone Note  I connected with [name] [name] on 08/16/19 at 8:03 AM by telephone and verified that I am speaking with the correct person using two identifiers. [name] [name] is currently located at home and nobody is currently with him during this visit. The provider, Loman Brooklyn, FNP is located in their office at time of visit.  I discussed the limitations, risks, security and privacy concerns of performing an evaluation and management service by telephone and the availability of in person appointments. I also discussed with the patient that there may be a patient responsible charge related to this service. The patient expressed understanding and agreed to proceed.  Subjective: PCP: [name] Pretty, FNP  Chief Complaint  Patient presents with  . Dizziness   Patient reports two days ago he woke up and was looking at the clock beside his bed when his vision was shifting from the right to left, back and forth really quick. He became dizzy and fell back in the bed. He was later able to get up and has had no further vision problems but continues with the dizziness. He does feel he has some pressure above his left eye but denies any other respiratory symptoms to indicate a sinus infection. He has been taking Zyrtec and Singulair, both once daily. He is drinking plenty of water. He started using Afrin yesterday and has been taking some meclizine from an old prescription in 2011. He reports all of these interventions are effective in relieving the dizziness. BP yesterday was 142/74, HR 56.    ROS: Per HPI  Current Outpatient Medications:  .  amLODipine (NORVASC) 5 MG tablet, Take 1 tablet (5 mg total) by mouth daily., Disp: 90 tablet, Rfl: 3 .  aspirin EC 81 MG tablet, Take 81 mg by mouth daily., Disp: , Rfl:  .  atenolol (TENORMIN) 25 MG tablet, Take 1 tablet (25 mg total) by mouth daily., Disp: 90 tablet, Rfl: 3 .  budesonide-formoterol (SYMBICORT) 160-4.5 MCG/ACT  inhaler, Inhale 2 puffs into the lungs 2 (two) times daily., Disp: 3 Inhaler, Rfl: 3 .  cetirizine (ZYRTEC) 10 MG tablet, Take 10 mg by mouth daily as needed. , Disp: , Rfl:  .  cholecalciferol (VITAMIN D) 1000 UNITS tablet, Take 1,000 Units by mouth daily., Disp: , Rfl:  .  citalopram (CELEXA) 20 MG tablet, Take 1 tablet (20 mg total) by mouth daily., Disp: 90 tablet, Rfl: 1 .  cyanocobalamin (,VITAMIN B-12,) 1000 MCG/ML injection, Inject 1 mL (1,000 mcg total) into the muscle every 30 (thirty) days., Disp: 3 mL, Rfl: 5 .  loperamide (IMODIUM) 2 MG capsule, Take 2 mg by mouth daily., Disp: , Rfl:  .  montelukast (SINGULAIR) 10 MG tablet, Take 1 tablet (10 mg total) by mouth daily., Disp: 90 tablet, Rfl: 1 .  Multiple Vitamins-Minerals (MULTIVITAMIN WITH MINERALS) tablet, Take 1 tablet by mouth daily., Disp: , Rfl:  .  naproxen (NAPROSYN) 500 MG tablet, Take 500 mg by mouth daily., Disp: , Rfl:  .  pravastatin (PRAVACHOL) 20 MG tablet, Take 0.5 tablets (10 mg total) by mouth every evening., Disp: 90 tablet, Rfl: 0 .  sildenafil (REVATIO) 20 MG tablet, Take 20 mg by mouth 3 (three) times daily as needed., Disp: , Rfl:   Allergies  Allergen Reactions  . Fenofibrate Other (See Comments)    Pt reports causes joint pains  . Rosuvastatin Other (See Comments)    Pt reports causes joint pains   Past Medical  History:  Diagnosis Date  . Asthma, chronic 06/02/2013  . Carpal tunnel syndrome 04/17/2015   Bilateral  . Depression 06/02/2013  . DYSPNEA ON EXERTION 06/04/2009   Qualifier: Diagnosis of  By: Domenic Polite, MD, Phillips Hay   . GERD (gastroesophageal reflux disease)   . Hereditary and idiopathic peripheral neuropathy 04/17/2015  . Hyperlipemia 06/02/2013  . LEG PAIN, BILATERAL 06/04/2009   Qualifier: Diagnosis of  By: Domenic Polite, MD, Phillips Hay   . PERCUTANEOUS TRANSLUMINAL CORONARY ANGIOPLASTY, HX OF 06/04/2009   Qualifier: Diagnosis of  By: Domenic Polite, MD, Phillips Hay   .  Prostate CA ([place])     Observations/Objective: A&O  No respiratory distress or wheezing audible over the phone Mood, judgement, and thought processes all WNL  Assessment and Plan: 1. Dizziness - Encouraged to continue Zyrtec and Claritin daily. Meclizine as needed. Afrin limited to 3 days. Start Flonase 1 spray each nare twice daily. Advised if respiratory symptoms begin or symptoms worsen to give [place] a call back.  - meclizine (ANTIVERT) 25 MG tablet; Take 1 tablet (25 mg total) by mouth 3 (three) times daily as needed for dizziness.  Dispense: 30 tablet; Refill: 0   Follow Up Instructions:  I discussed the assessment and treatment plan with the patient. The patient was provided an opportunity to ask questions and all were answered. The patient agreed with the plan and demonstrated an understanding of the instructions.   The patient was advised to call back or seek an in-person evaluation if the symptoms worsen or if the condition fails to improve as anticipated.  The above assessment and management plan was discussed with the patient. The patient verbalized understanding of and has agreed to the management plan. Patient is aware to call the clinic if symptoms persist or worsen. Patient is aware when to return to the clinic for a follow-up visit. Patient educated on when it is appropriate to go to the emergency department.   Time call ended: 8:13 AM  I provided 12 minutes of non-face-to-face time during this encounter.  Hendricks Limes, MSN, APRN, FNP-C [facility] 08/16/19

## 2019-08-22 ENCOUNTER — Ambulatory Visit: Payer: Medicare Other | Admitting: Family Medicine

## 2019-08-29 DIAGNOSIS — H40013 Open angle with borderline findings, low risk, bilateral: Secondary | ICD-10-CM | POA: Diagnosis not present

## 2019-08-29 DIAGNOSIS — Z961 Presence of intraocular lens: Secondary | ICD-10-CM | POA: Diagnosis not present

## 2019-08-29 DIAGNOSIS — H43813 Vitreous degeneration, bilateral: Secondary | ICD-10-CM | POA: Diagnosis not present

## 2019-09-01 DIAGNOSIS — Z8546 Personal history of malignant neoplasm of prostate: Secondary | ICD-10-CM | POA: Diagnosis not present

## 2019-10-10 ENCOUNTER — Other Ambulatory Visit: Payer: Self-pay

## 2019-10-11 ENCOUNTER — Ambulatory Visit (INDEPENDENT_AMBULATORY_CARE_PROVIDER_SITE_OTHER): Payer: Medicare Other | Admitting: Nurse Practitioner

## 2019-10-11 ENCOUNTER — Encounter: Payer: Self-pay | Admitting: Nurse Practitioner

## 2019-10-11 VITALS — BP 146/71 | HR 64 | Temp 97.8°F | Resp 20 | Ht 74.0 in | Wt 251.0 lb

## 2019-10-11 DIAGNOSIS — Z6828 Body mass index (BMI) 28.0-28.9, adult: Secondary | ICD-10-CM

## 2019-10-11 DIAGNOSIS — E782 Mixed hyperlipidemia: Secondary | ICD-10-CM | POA: Diagnosis not present

## 2019-10-11 DIAGNOSIS — F3342 Major depressive disorder, recurrent, in full remission: Secondary | ICD-10-CM | POA: Diagnosis not present

## 2019-10-11 DIAGNOSIS — I10 Essential (primary) hypertension: Secondary | ICD-10-CM | POA: Diagnosis not present

## 2019-10-11 DIAGNOSIS — K219 Gastro-esophageal reflux disease without esophagitis: Secondary | ICD-10-CM | POA: Diagnosis not present

## 2019-10-11 DIAGNOSIS — Z125 Encounter for screening for malignant neoplasm of prostate: Secondary | ICD-10-CM

## 2019-10-11 DIAGNOSIS — J4520 Mild intermittent asthma, uncomplicated: Secondary | ICD-10-CM

## 2019-10-11 MED ORDER — AMLODIPINE BESYLATE 5 MG PO TABS: 5.0000 mg | ORAL_TABLET | Freq: Every day | ORAL | 1 refills | Status: DC

## 2019-10-11 MED ORDER — BUDESONIDE-FORMOTEROL FUMARATE 160-4.5 MCG/ACT IN AERO: 2.0000 | INHALATION_SPRAY | Freq: Two times a day (BID) | RESPIRATORY_TRACT | 3 refills | Status: DC

## 2019-10-11 MED ORDER — SILDENAFIL CITRATE 20 MG PO TABS: 20.0000 mg | ORAL_TABLET | Freq: Three times a day (TID) | ORAL | 5 refills | Status: DC | PRN

## 2019-10-11 MED ORDER — PRAVASTATIN SODIUM 20 MG PO TABS: 10.0000 mg | ORAL_TABLET | Freq: Every evening | ORAL | 1 refills | Status: DC

## 2019-10-11 MED ORDER — ATENOLOL 25 MG PO TABS: 25.0000 mg | ORAL_TABLET | Freq: Every day | ORAL | 1 refills | Status: DC

## 2019-10-11 MED ORDER — MONTELUKAST SODIUM 10 MG PO TABS: 10.0000 mg | ORAL_TABLET | Freq: Every day | ORAL | 1 refills | Status: DC

## 2019-10-11 MED ORDER — CITALOPRAM HYDROBROMIDE 20 MG PO TABS: 20.0000 mg | ORAL_TABLET | Freq: Every day | ORAL | 1 refills | Status: DC

## 2019-10-11 NOTE — Patient Instructions (Signed)
Exercising to Stay Healthy To become healthy and stay healthy, it is recommended that you do moderate-intensity and vigorous-intensity exercise. You can tell that you are exercising at a moderate intensity if your heart starts beating faster and you start breathing faster but can still hold a conversation. You can tell that you are exercising at a vigorous intensity if you are breathing much harder and faster and cannot hold a conversation while exercising. Exercising regularly is important. It has many health benefits, such as:  Improving overall fitness, flexibility, and endurance.  Increasing bone density.  Helping with weight control.  Decreasing body fat.  Increasing muscle strength.  Reducing stress and tension.  Improving overall health. How often should I exercise? Choose an activity that you enjoy, and set realistic goals. Your health care provider can help you make an activity plan that works for you. Exercise regularly as told by your health care provider. This may include:  Doing strength training two times a week, such as: ? Lifting weights. ? Using resistance bands. ? Push-ups. ? Sit-ups. ? Yoga.  Doing a certain intensity of exercise for a given amount of time. Choose from these options: ? A total of 150 minutes of moderate-intensity exercise every week. ? A total of 75 minutes of vigorous-intensity exercise every week. ? A mix of moderate-intensity and vigorous-intensity exercise every week. Children, pregnant women, people who have not exercised regularly, people who are overweight, and older adults may need to talk with a health care provider about what activities are safe to do. If you have a medical condition, be sure to talk with your health care provider before you start a new exercise program. What are some exercise ideas? Moderate-intensity exercise ideas include:  Walking 1 mile (1.6 km) in about 15  minutes.  Biking.  Hiking.  Golfing.  Dancing.  Water aerobics. Vigorous-intensity exercise ideas include:  Walking 4.5 miles (7.2 km) or more in about 1 hour.  Jogging or running 5 miles (8 km) in about 1 hour.  Biking 10 miles (16.1 km) or more in about 1 hour.  Lap swimming.  Roller-skating or in-line skating.  Cross-country skiing.  Vigorous competitive sports, such as football, basketball, and soccer.  Jumping rope.  Aerobic dancing. What are some everyday activities that can help me to get exercise?  Yard work, such as: ? Pushing a lawn mower. ? Raking and bagging leaves.  Washing your car.  Pushing a stroller.  Shoveling snow.  Gardening.  Washing windows or floors. How can I be more active in my day-to-day activities?  Use stairs instead of an elevator.  Take a walk during your lunch break.  If you drive, park your car farther away from your work or school.  If you take public transportation, get off one stop early and walk the rest of the way.  Stand up or walk around during all of your indoor phone calls.  Get up, stretch, and walk around every 30 minutes throughout the day.  Enjoy exercise with a friend. Support to continue exercising will help you keep a regular routine of activity. What guidelines can I follow while exercising?  Before you start a new exercise program, talk with your health care provider.  Do not exercise so much that you hurt yourself, feel dizzy, or get very short of breath.  Wear comfortable clothes and wear shoes with good support.  Drink plenty of water while you exercise to prevent dehydration or heat stroke.  Work out until your breathing   and your heartbeat get faster. Where to find more information  [place].S[place] Department of Health and Human Services: www.hhs.gov  Centers for Disease Control and Prevention (CDC): www.cdc.gov Summary  Exercising regularly is important. It will improve your overall fitness,  flexibility, and endurance.  Regular exercise also will improve your overall health. It can help you control your weight, reduce stress, and improve your bone density.  Do not exercise so much that you hurt yourself, feel dizzy, or get very short of breath.  Before you start a new exercise program, talk with your health care provider. This information is not intended to replace advice given to you by your health care provider. Make sure you discuss any questions you have with your health care provider. Document Revised: 08/13/2017 Document Reviewed: 07/22/2017 Elsevier Patient Education  2020 Elsevier Inc.  

## 2019-10-11 NOTE — Progress Notes (Signed)
Subjective:    Patient ID: [name] [name], male    DOB: 20-May-1946, 74 y.o.   MRN: [id number]    Chief Complaint: Medical Management of Chronic Issues    HPI:  1. Essential hypertension Dull pressure on left side of chest for the last year or so. Occasional SOB that goes away with deep breaths. Occasional headaches. Does not check BP at home. Cardiologist decreased one BP medication in fall 2020.  2. Gastroesophageal reflux disease, unspecified whether esophagitis present No complaints of heartburn. Had fundoplasty at [facility] about 20 years ago.  3. Mixed hyperlipidemia Trying to cut out carbs. Not watching fats in diet.  4. Recurrent major depressive disorder, in full remission ([place]) Reports no depression or anxiety recently.  5. BMI 28.0-28.9,adult Had lost 48 pounds but has gained 30 pounds back in the last year. Walks dog but no other exercise.  6. Mild intermittent chronic asthma without complication No issues with asthma recently.     Outpatient Encounter Medications as of 10/11/2019  Medication Sig  . amLODipine (NORVASC) 5 MG tablet Take 1 tablet (5 mg total) by mouth daily.  Marland Kitchen aspirin EC 81 MG tablet Take 81 mg by mouth daily.  Marland Kitchen atenolol (TENORMIN) 25 MG tablet Take 1 tablet (25 mg total) by mouth daily.  . budesonide-formoterol (SYMBICORT) 160-4.5 MCG/ACT inhaler Inhale 2 puffs into the lungs 2 (two) times daily.  . cetirizine (ZYRTEC) 10 MG tablet Take 10 mg by mouth daily as needed.   . cholecalciferol (VITAMIN D) 1000 UNITS tablet Take 1,000 Units by mouth daily.  . citalopram (CELEXA) 20 MG tablet Take 1 tablet (20 mg total) by mouth daily.  . cyanocobalamin (,VITAMIN B-12,) 1000 MCG/ML injection Inject 1 mL (1,000 mcg total) into the muscle every 30 (thirty) days.  Marland Kitchen loperamide (IMODIUM) 2 MG capsule Take 2 mg by mouth daily.  . montelukast (SINGULAIR) 10 MG tablet Take 1 tablet (10 mg total) by mouth daily.  . Multiple Vitamins-Minerals (MULTIVITAMIN WITH  MINERALS) tablet Take 1 tablet by mouth daily.  . naproxen (NAPROSYN) 500 MG tablet Take 500 mg by mouth daily.  . pravastatin (PRAVACHOL) 20 MG tablet Take 0.5 tablets (10 mg total) by mouth every evening.  . sildenafil (REVATIO) 20 MG tablet Take 20 mg by mouth 3 (three) times daily as needed.  . [DISCONTINUED] meclizine (ANTIVERT) 25 MG tablet Take 1 tablet (25 mg total) by mouth 3 (three) times daily as needed for dizziness.   No facility-administered encounter medications on file as of 10/11/2019.    Past Surgical History:  Procedure Laterality Date  . CARDIAC CATHETERIZATION  1997  . HIATAL HERNIA REPAIR    . LAPAROSCOPIC GASTRIC BANDING  2000   [facility]   . ROBOT ASSISTED LAPAROSCOPIC RADICAL PROSTATECTOMY      Family History  Problem Relation Age of Onset  . Heart disease Other        Early Cardiac disease for uncle is their 35's as well as deceased.  Marland Kitchen Heart failure Mother   . Dementia Mother 34  . Cancer Father 10       testicular, lung  . Esophageal cancer Brother     New complaints: None at this time.  Social history: Lives with wife. Son recently married.  Controlled substance contract: n/a     Review of Systems  Constitutional: Negative for diaphoresis.  Eyes: Negative for pain.  Respiratory: Negative for shortness of breath.   Cardiovascular: Negative for chest pain, palpitations and leg swelling.  Gastrointestinal: Negative for abdominal pain.  Endocrine: Negative for polydipsia.  Skin: Negative for rash.  Neurological: Negative for dizziness, weakness and headaches.  Hematological: Does not bruise/bleed easily.  All other systems reviewed and are negative.      Objective:   Physical Exam Vitals and nursing note reviewed.  Constitutional:      Appearance: Normal appearance. He is well-developed.  HENT:     Head: Normocephalic.     Nose: Nose normal.  Eyes:     Pupils: Pupils are equal, round, and reactive to light.  Neck:     Thyroid: No  thyroid mass or thyromegaly.     Vascular: No carotid bruit or JVD.     Trachea: Phonation normal.  Cardiovascular:     Rate and Rhythm: Normal rate and regular rhythm.  Pulmonary:     Effort: Pulmonary effort is normal. No respiratory distress.     Breath sounds: Normal breath sounds.  Abdominal:     General: Bowel sounds are normal.     Palpations: Abdomen is soft.     Tenderness: There is no abdominal tenderness.  Musculoskeletal:        General: Normal range of motion.     Cervical back: Normal range of motion and neck supple.  Lymphadenopathy:     Cervical: No cervical adenopathy.  Skin:    General: Skin is warm and dry.  Neurological:     Mental Status: He is alert and oriented to person, place, and time.  Psychiatric:        Behavior: Behavior normal.        Thought Content: Thought content normal.        Judgment: Judgment normal.   BP (!) 146/71   Pulse 64   Temp 97.8 F (36.6 C) (Temporal)   Resp 20   Ht '6\' 2"'$  (1.88 m)   Wt 251 lb (113.9 kg)   SpO2 92%   BMI 32.23 kg/m          Assessment & Plan:  [name] [name] comes in today with chief complaint of Medical Management of Chronic Issues   Diagnosis and orders addressed:  1. Essential hypertension Low sodium diet - CMP14+EGFR - amLODipine (NORVASC) 5 MG tablet; Take 1 tablet (5 mg total) by mouth daily.  Dispense: 90 tablet; Refill: 1 - atenolol (TENORMIN) 25 MG tablet; Take 1 tablet (25 mg total) by mouth daily.  Dispense: 90 tablet; Refill: 1 - budesonide-formoterol (SYMBICORT) 160-4.5 MCG/ACT inhaler; Inhale 2 puffs into the lungs 2 (two) times daily.  Dispense: 3 Inhaler; Refill: 3  2. Gastroesophageal reflux disease, unspecified whether esophagitis present Avoid spicy foods Do not eat 2 hours prior to bedtime  3. Mixed hyperlipidemia Low fat diet - Lipid panel - pravastatin (PRAVACHOL) 20 MG tablet; Take 0.5 tablets (10 mg total) by mouth every evening.  Dispense: 90 tablet; Refill: 1  4.  Recurrent major depressive disorder, in full remission ([place]) Stress management - citalopram (CELEXA) 20 MG tablet; Take 1 tablet (20 mg total) by mouth daily.  Dispense: 90 tablet; Refill: 1  5. BMI 28.0-28.9,adult Discussed diet and exercise for person with BMI >25 Will recheck weight in 3-6 months  6. Mild intermittent chronic asthma without complication - montelukast (SINGULAIR) 10 MG tablet; Take 1 tablet (10 mg total) by mouth daily.  Dispense: 90 tablet; Refill: 1  7. Prostate cancer screening - PSA, total and free   Labs pending Health Maintenance reviewed Diet and exercise encouraged  Follow up  plan: 6 months   Mary-Margaret Hassell Done, FNP

## 2019-10-12 LAB — CMP14+EGFR
ALT: 13 IU/L (ref 0–44)
AST: 18 IU/L (ref 0–40)
Albumin/Globulin Ratio: 1.8 (ref 1.2–2.2)
Albumin: 4.6 g/dL (ref 3.7–4.7)
Alkaline Phosphatase: 83 IU/L (ref 39–117)
BUN/Creatinine Ratio: 15 (ref 10–24)
BUN: 22 mg/dL (ref 8–27)
Bilirubin Total: 0.3 mg/dL (ref 0.0–1.2)
CO2: 26 mmol/L (ref 20–29)
Calcium: 9.8 mg/dL (ref 8.6–10.2)
Chloride: 99 mmol/L (ref 96–106)
Creatinine, Ser: 1.44 mg/dL — ABNORMAL HIGH (ref 0.76–1.27)
GFR calc Af Amer: 55 mL/min/{1.73_m2} — ABNORMAL LOW (ref >59)
GFR calc non Af Amer: 48 mL/min/{1.73_m2} — ABNORMAL LOW (ref >59)
Globulin, Total: 2.6 g/dL (ref 1.5–4.5)
Glucose: 101 mg/dL — ABNORMAL HIGH (ref 65–99)
Potassium: 4.9 mmol/L (ref 3.5–5.2)
Sodium: 138 mmol/L (ref 134–144)
Total Protein: 7.2 g/dL (ref 6.0–8.5)

## 2019-10-12 LAB — LIPID PANEL
Chol/HDL Ratio: 4.3 ratio (ref 0.0–5.0)
Cholesterol, Total: 176 mg/dL (ref 100–199)
HDL: 41 mg/dL (ref >39)
LDL Chol Calc (NIH): 87 mg/dL (ref 0–99)
Triglycerides: 293 mg/dL — ABNORMAL HIGH (ref 0–149)
VLDL Cholesterol Cal: 48 mg/dL — ABNORMAL HIGH (ref 5–40)

## 2019-10-12 LAB — PSA, TOTAL AND FREE
PSA, Free: <0.02 ng/mL
Prostate Specific Ag, Serum: <0.1 ng/mL (ref 0.0–4.0)

## 2019-10-20 DIAGNOSIS — Z23 Encounter for immunization: Secondary | ICD-10-CM | POA: Diagnosis not present

## 2019-10-23 ENCOUNTER — Ambulatory Visit (INDEPENDENT_AMBULATORY_CARE_PROVIDER_SITE_OTHER): Payer: Medicare Other | Admitting: *Deleted

## 2019-10-23 DIAGNOSIS — Z0000 Encounter for general adult medical examination without abnormal findings: Secondary | ICD-10-CM | POA: Diagnosis not present

## 2019-10-23 NOTE — Progress Notes (Signed)
[place] VISIT  10/23/2019  Telephone Visit Disclaimer This Medicare AWV was conducted by telephone due to national recommendations for restrictions regarding the COVID-19 Pandemic (e.g. social distancing).  I verified, using two identifiers, that I am speaking with [name] [name] or their authorized healthcare agent. I discussed the limitations, risks, security, and privacy concerns of performing an evaluation and management service by telephone and the potential availability of an in-person appointment in the future. The patient expressed understanding and agreed to proceed.   Subjective:  [name] [name] is a 74 y.o. male patient of Chevis Pretty, [place] who had a Medicare Annual Wellness Visit today via telephone. Nysir is Retired and lives with their spouse. he has 3 children. he reports that he is socially active and does interact with friends/family regularly. he is moderately physically active and enjoys wood working, Facilities manager carved wooden signs, working on cars, yard work, minor repairs around [employer] and Therapist, occupational.  Patient Care Team: Chevis Pretty, FNP as PCP - General (Family Medicine) Magnus Sinning, MD as Consulting Physician Magnus Sinning, MD as Consulting Physician Mcarthur Rossetti, MD as Consulting Physician (Orthopedic Surgery) Harlen Labs, MD as Referring Physician (Optometry) Jessy Oto, MD as Consulting Physician (Orthopedic Surgery) Geri Seminole, MD as Referring Physician (Dermatopathology)  Advanced Directives 10/23/2019 10/20/2018 02/25/2018 02/17/2018 10/08/2014  Does Patient Have a Medical Advance Directive? No No No No No  Would patient like information on creating a medical advance directive? No - Patient declined Yes (MAU/Ambulatory/Procedural Areas - Information given) No - Patient declined - No - patient declined information    Hospital Utilization Over the Past 12 Months: # of hospitalizations  or ER visits: 0 # of surgeries: 1  Review of Systems    Patient reports that his overall health is unchanged compared to last year.  History obtained from chart review  Patient Reported Readings (BP, Pulse, CBG, Weight, etc) none  Pain Assessment Pain : 0-10 Pain Score: 2  Pain Type: Chronic pain(arthritis pain) Pain Relieving Factors: rest Effect of Pain on Daily Activities: mild  Pain Relieving Factors: rest  Current Medications & Allergies (verified) Allergies as of 10/23/2019      Reactions   Fenofibrate Other (See Comments)   Pt reports causes joint pains   Rosuvastatin Other (See Comments)   Pt reports causes joint pains      Medication List       Accurate as of October 23, 2019  1:37 PM. If you have any questions, ask your nurse or doctor.        STOP taking these medications   multivitamin with minerals tablet     TAKE these medications   amLODipine 5 MG tablet Commonly known as: NORVASC Take 1 tablet (5 mg total) by mouth daily.   aspirin EC 81 MG tablet Take 81 mg by mouth daily.   atenolol 25 MG tablet Commonly known as: TENORMIN Take 1 tablet (25 mg total) by mouth daily.   budesonide-formoterol 160-4.5 MCG/ACT inhaler Commonly known as: SYMBICORT Inhale 2 puffs into the lungs 2 (two) times daily.   cetirizine 10 MG tablet Commonly known as: ZYRTEC Take 10 mg by mouth daily as needed.   cholecalciferol 1000 units tablet Commonly known as: VITAMIN D Take 1,000 Units by mouth daily.   citalopram 20 MG tablet Commonly known as: CELEXA Take 1 tablet (20 mg total) by mouth daily.   cyanocobalamin 1000 MCG/ML injection Commonly known as: (VITAMIN  B-12) Inject 1 mL (1,000 mcg total) into the muscle every 30 (thirty) days.   loperamide 2 MG capsule Commonly known as: IMODIUM Take 2 mg by mouth daily.   montelukast 10 MG tablet Commonly known as: SINGULAIR Take 1 tablet (10 mg total) by mouth daily.   naproxen 500 MG tablet Commonly  known as: NAPROSYN Take 500 mg by mouth daily.   pravastatin 20 MG tablet Commonly known as: PRAVACHOL Take 0.5 tablets (10 mg total) by mouth every evening.   sildenafil 20 MG tablet Commonly known as: REVATIO Take 1 tablet (20 mg total) by mouth 3 (three) times daily as needed.       History (reviewed): Past Medical History:  Diagnosis Date  . Asthma, chronic 06/02/2013  . Carpal tunnel syndrome 04/17/2015   Bilateral  . Depression 06/02/2013  . DYSPNEA ON EXERTION 06/04/2009   Qualifier: Diagnosis of  By: Domenic Polite, MD, [place] Hay   . GERD (gastroesophageal reflux disease)   . Hereditary and idiopathic peripheral neuropathy 04/17/2015  . Hyperlipemia 06/02/2013  . LEG PAIN, BILATERAL 06/04/2009   Qualifier: Diagnosis of  By: Domenic Polite, MD, Phillips Hay   . PERCUTANEOUS TRANSLUMINAL CORONARY ANGIOPLASTY, HX OF 06/04/2009   Qualifier: Diagnosis of  By: Domenic Polite, MD, Phillips Hay   . Prostate CA [facility])    Past Surgical History:  Procedure Laterality Date  . CARDIAC CATHETERIZATION  1997  . EYE SURGERY Bilateral 2020   cataracts removed  . HIATAL HERNIA REPAIR    . LAPAROSCOPIC GASTRIC BANDING  2000   [facility]   . MENISCUS REPAIR Left 2012  . ROBOT ASSISTED LAPAROSCOPIC RADICAL PROSTATECTOMY    . ROTATOR CUFF REPAIR Right 2019   Family History  Problem Relation Age of Onset  . Heart disease Other        Early Cardiac disease for uncle is their 43's as well as deceased.  Marland Kitchen Heart failure Mother   . Dementia Mother 67  . Cancer Father 72       testicular, lung  . Esophageal cancer Brother    Social History   Socioeconomic History  . Marital status: Married    Spouse name: [name]  . Number of children: 3  . Years of education: 8  . Highest education level: Master's degree (e.g., MA, MS, MEng, MEd, MSW, MBA)  Occupational History  . Occupation: Retired    Comment: Nature conservation officer  Tobacco Use  . Smoking status: Former Smoker    Packs/day: 1.00     Years: 20.00    Pack years: 20.00    Types: Cigarettes    Quit date: 01/19/1996    Years since quitting: 23.7  . Smokeless tobacco: Never Used  Substance and Sexual Activity  . Alcohol use: Yes    Alcohol/week: 2.0 standard drinks    Types: 2 Cans of beer per week  . Drug use: No  . Sexual activity: Yes  Other Topics Concern  . Not on file  Social History Narrative   Patient is married and lives at home with is wife. He has three adult children and five grandchildren. 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    Social Determinants of Health   Financial Resource Strain: Low Risk   . Difficulty of Paying Living Expenses: Not hard at all  Food Insecurity: No Food Insecurity  . Worried About Charity fundraiser in the Last Year: Never true  . Ran Out of Food in the Last Year:  Never true  Transportation Needs: No Transportation Needs  . Lack of Transportation (Medical): No  . Lack of Transportation (Non-Medical): No  Physical Activity: Sufficiently Active  . Days of Exercise per Week: 7 days  . Minutes of Exercise per Session: 30 min  Stress: No Stress Concern Present  . Feeling of Stress : Not at all  Social Connections: Slightly Isolated  . Frequency of Communication with Friends and Family: More than three times a week  . Frequency of Social Gatherings with Friends and Family: More than three times a week  . Attends Religious Services: Never  . Active Member of Clubs or Organizations: Yes  . Attends Archivist Meetings: More than 4 times per year  . Marital Status: Married    Activities of Daily Living In your present state of health, do you have any difficulty performing the following activities: 10/23/2019  Hearing? N  Vision? N  Comment wears readers-gets yearly eye exam-had cataracts removed from both eyes about 7 months ago  Difficulty concentrating or making decisions? N  Walking or climbing stairs? Y  Comment does fine on flat surfaces but  knees start hurting if he has to climb stairs often  Dressing or bathing? N  Doing errands, shopping? N  Preparing Food and eating ? N  Using the Toilet? N  In the past six months, have you accidently leaked urine? N  Do you have problems with loss of bowel control? N  Managing your Medications? Y  Comment his wife puts his medications in a pill box and he takes whatever she lays out  Managing your Finances? N  Housekeeping or managing your Housekeeping? N  Some recent data might be hidden    Patient Education/ Literacy How often do you need to have someone help you when you read instructions, pamphlets, or other written materials from your doctor or pharmacy?: 1 - Never What is the last grade level you completed in school?: Masters Degree-Science and Customer service manager and Management  Exercise Current Exercise Habits: Home exercise routine, Type of exercise: walking, Time (Minutes): 30, Frequency (Times/Week): 7, Weekly Exercise (Minutes/Week): 210, Intensity: Mild, Exercise limited by: orthopedic condition(s);respiratory conditions(s)  Diet Patient reports consuming 3 meals a day and 0 snack(s) a day Patient reports that his primary diet is: Regular Patient reports that she does have regular access to food.   Depression Screen PHQ 2/9 Scores 10/23/2019 10/11/2019 04/10/2019 11/01/2018 10/20/2018 04/28/2018 01/07/2018  PHQ - 2 Score 0 0 0 0 0 0 0  PHQ- 9 Score - - - - - - -     Fall Risk Fall Risk  10/23/2019 10/11/2019 04/10/2019 11/01/2018 10/20/2018  Falls in the past year? 0 0 0 0 0     Objective:  Isabella L Yeary seemed alert and oriented and he participated appropriately during our telephone visit.  Blood Pressure Weight BMI  BP Readings from Last 3 Encounters:  10/11/19 (!) 146/71  05/11/19 125/68  04/10/19 135/74   Wt Readings from Last 3 Encounters:  10/11/19 251 lb (113.9 kg)  05/11/19 245 lb 3.2 oz (111.2 kg)  04/10/19 240 lb (108.9 kg)   BMI Readings from Last  1 Encounters:  10/11/19 32.23 kg/m    *Unable to obtain current vital signs, weight, and BMI due to telephone visit type  Hearing/Vision  . Amar did not seem to have difficulty with hearing/understanding during the telephone conversation . Reports that he has had a formal eye exam by an eye care professional  within the past year . Reports that he has not had a formal hearing evaluation within the past year *Unable to fully assess hearing and vision during telephone visit type  Cognitive Function: 6CIT Screen 10/23/2019  What Year? 0 points  What month? 0 points  What time? 0 points  Count back from 20 0 points  Months in reverse 0 points  Repeat phrase 0 points  Total Score 0   (Normal:0-7, Significant for Dysfunction: >8)  Normal Cognitive Function Screening: Yes   Immunization & Health Maintenance Record Immunization History  Administered Date(s) Administered  . Fluad Quad(high Dose 65+) 06/21/2019  . Influenza, High Dose Seasonal PF 07/07/2016, 06/15/2017, 07/14/2018  . Influenza,inj,Quad PF,6+ Mos 06/27/2013, 06/25/2014, 06/28/2015  . Pneumococcal Conjugate-13 06/28/2015  . Pneumococcal Polysaccharide-23 10/27/2011  . Tdap 07/07/2016  . Zoster Recombinat (Shingrix) 12/09/2017, 02/16/2018    Health Maintenance  Topic Date Due  . COLONOSCOPY  12/14/2019  . TETANUS/TDAP  07/07/2026  . INFLUENZA VACCINE  Completed  . Hepatitis C Screening  Completed  . PNA vac Low Risk Adult  Completed       Assessment  This is a routine wellness examination for Olvin L Ogborn.  Health Maintenance: Due or Overdue There are no preventive care reminders to display for this patient.  [name] [name] does not need a referral for Community Assistance: Care Management:   no Social Work:    no Prescription Assistance:  no Nutrition/Diabetes Education:  no   Plan:  Personalized Goals Goals Addressed            This Visit's Progress   . DIET - INCREASE WATER INTAKE       Try  to drink 6-8 glasses of water daily      Personalized Health Maintenance & Screening Recommendations  He is up to date on all recommended health screenings  Lung Cancer Screening Recommended: no (Low Dose CT Chest recommended if Age 2-80 years, 30 pack-year currently smoking OR have quit w/in past 15 years) Hepatitis C Screening recommended: no HIV Screening recommended: no  Advanced Directives: Written information was not prepared per patient's request.  Referrals & Orders No orders of the defined types were placed in this encounter.   Follow-up Plan . Follow-up with Chevis Pretty, FNP as planned   I have personally reviewed and noted the following in the patient's chart:   . Medical and social history . Use of alcohol, tobacco or illicit drugs  . Current medications and supplements . Functional ability and status . Nutritional status . Physical activity . Advanced directives . List of other physicians . Hospitalizations, surgeries, and ER visits in previous 12 months . Vitals . Screenings to include cognitive, depression, and falls . Referrals and appointments  In addition, I have reviewed and discussed with [name] [name] certain preventive protocols, quality metrics, and best practice recommendations. A written personalized care plan for preventive services as well as general preventive health recommendations is available and can be mailed to the patient at his request.      Milas Hock, LPN  [id number]

## 2019-10-23 NOTE — Patient Instructions (Signed)

## 2019-11-07 ENCOUNTER — Telehealth: Payer: Self-pay | Admitting: Nurse Practitioner

## 2019-11-07 NOTE — Chronic Care Management (AMB) (Signed)
  Chronic Care Management   Outreach Note  11/07/2019 Name: [name] [name] MRN: [id number] DOB: 29-Jan-1946  [name] [name] is a 74 y.o. year old male who is a primary care patient of Chevis Pretty, [place]. I reached out to [name] [name] by phone today in response to a referral sent by [name] [name]'s health plan.     An unsuccessful telephone outreach was attempted today. The patient was referred to the case management team for assistance with care management and care coordination.   Follow Up Plan: A HIPPA compliant phone message was left for the patient providing contact information and requesting a return call.  The care management team will reach out to the patient again over the next 7 days.  If patient returns call to provider office, please advise to call [facility]  at [place], [place], [place], [place] [address] Direct Dial: [phone] [email or web address] Website: [email or web address]

## 2019-11-08 NOTE — Chronic Care Management (AMB) (Signed)
  Chronic Care Management   Note  11/08/2019 Name: AUGUSTA HILBERT MRN: [id number] DOB: 1946/04/04  [name] is a 74 y.o. year old male who is a primary care patient of Chevis Pretty, [place]. I reached out to [name] by phone today in response to a referral sent by Mr. Dinh Ayotte Kennerly's health plan.     Mr. Kozak's wife Steva Ready was given information about Chronic Care Management services today including:  1. CCM service includes personalized support from designated clinical staff supervised by his physician, including individualized plan of care and coordination with other care providers 2. 24/7 contact phone numbers for assistance for urgent and routine care needs. 3. Service will only be billed when office clinical staff spend 20 minutes or more in a month to coordinate care. 4. Only one practitioner may furnish and bill the service in a calendar month. 5. The patient may stop CCM services at any time (effective at the end of the month) by phone call to the office staff. 6. The patient will be responsible for cost sharing (co-pay) of up to 20% of the service fee (after annual deductible is met).  Patient's wife Steva Ready agreed to services and verbal consent obtained.   Follow up plan: Telephone appointment with care management team member scheduled for:12/18/2019  Noreene Larsson, [place], [place], [place] [address] Direct Dial: [phone] [email or web address] Website: [email or web address]

## 2019-11-08 NOTE — Chronic Care Management (AMB) (Signed)
  Chronic Care Management   Outreach Note  11/08/2019 Name: [name] [name] MRN: [id number] DOB: Aug 07, 1946  [name] [name] is a 74 y.o. year old male who is a primary care patient of Chevis Pretty, [place]. I reached out to [name] [name] by phone today in response to a referral sent by Mr. Dallin Darragh Bergum's health plan.     A second unsuccessful telephone outreach was attempted today. The patient was referred to the case management team for assistance with care management and care coordination.   Follow Up Plan: A HIPPA compliant phone message was left for the patient providing contact information and requesting a return call.  The care management team will reach out to the patient again over the next 7 days.  If patient returns call to provider office, please advise to call [facility] at [place], [place], [place], [address] [address] Direct Dial: [phone] [email or web address] Website: [email or web address]

## 2019-11-18 DIAGNOSIS — Z23 Encounter for immunization: Secondary | ICD-10-CM | POA: Diagnosis not present

## 2019-11-24 ENCOUNTER — Telehealth: Payer: Self-pay | Admitting: Physical Medicine and Rehabilitation

## 2019-11-27 NOTE — Telephone Encounter (Signed)
Left message #1

## 2019-11-27 NOTE — Telephone Encounter (Signed)
Yeah,ok but confirm he feels simialr pain and wants repeat. Complicated by taking opioid and may take longer visit

## 2019-11-29 DIAGNOSIS — K219 Gastro-esophageal reflux disease without esophagitis: Secondary | ICD-10-CM | POA: Diagnosis not present

## 2019-11-29 DIAGNOSIS — I10 Essential (primary) hypertension: Secondary | ICD-10-CM | POA: Diagnosis not present

## 2019-11-29 DIAGNOSIS — Z1211 Encounter for screening for malignant neoplasm of colon: Secondary | ICD-10-CM | POA: Diagnosis not present

## 2019-12-07 DIAGNOSIS — K219 Gastro-esophageal reflux disease without esophagitis: Secondary | ICD-10-CM | POA: Diagnosis not present

## 2019-12-07 DIAGNOSIS — Z1211 Encounter for screening for malignant neoplasm of colon: Secondary | ICD-10-CM | POA: Diagnosis not present

## 2019-12-07 DIAGNOSIS — K635 Polyp of colon: Secondary | ICD-10-CM | POA: Diagnosis not present

## 2019-12-07 DIAGNOSIS — K5730 Diverticulosis of large intestine without perforation or abscess without bleeding: Secondary | ICD-10-CM | POA: Diagnosis not present

## 2019-12-07 DIAGNOSIS — R12 Heartburn: Secondary | ICD-10-CM | POA: Diagnosis not present

## 2019-12-07 DIAGNOSIS — D123 Benign neoplasm of transverse colon: Secondary | ICD-10-CM | POA: Diagnosis not present

## 2019-12-18 ENCOUNTER — Ambulatory Visit: Payer: Medicare Other | Admitting: *Deleted

## 2019-12-18 NOTE — Progress Notes (Signed)
  Chronic Care Management   Outreach Note  12/18/2019 Name: [name] [name] MRN: [id number] DOB: [birth year]  Referred by: Chevis Pretty, FNP Reason for referral : Chronic Care Management (RN Initial Visit)   An unsuccessful Initial Telephone outreach was attempted today. The patient was referred to the case management team for assistance with care management and care coordination.   Follow Up Plan: A HIPPA compliant phone message was left for the patient providing contact information and requesting a return call.   Message will be sent to Care Guides to request that they contact patient to reschedule initial visit  Chong Sicilian, BSN, RN-BC [facility] [place] [facility] Direct Dial: [phone]

## 2020-01-16 ENCOUNTER — Telehealth: Payer: Self-pay | Admitting: Physical Medicine and Rehabilitation

## 2020-01-16 NOTE — Telephone Encounter (Signed)
Returned patient's call to schedule possible bilateral L4-5 facets (see message from 3/12). Left message.

## 2020-02-07 ENCOUNTER — Encounter: Payer: Self-pay | Admitting: Physical Medicine and Rehabilitation

## 2020-02-07 ENCOUNTER — Ambulatory Visit: Payer: Self-pay

## 2020-02-07 ENCOUNTER — Other Ambulatory Visit: Payer: Self-pay

## 2020-02-07 ENCOUNTER — Ambulatory Visit (INDEPENDENT_AMBULATORY_CARE_PROVIDER_SITE_OTHER): Payer: Medicare Other | Admitting: Physical Medicine and Rehabilitation

## 2020-02-07 VITALS — BP 144/68 | HR 60

## 2020-02-07 DIAGNOSIS — M47816 Spondylosis without myelopathy or radiculopathy, lumbar region: Secondary | ICD-10-CM

## 2020-02-07 MED ORDER — METHYLPREDNISOLONE ACETATE 80 MG/ML IJ SUSP
80.0000 mg | Freq: Once | INTRAMUSCULAR | Status: AC
  Administered 2020-02-07: 80 mg

## 2020-02-07 NOTE — Progress Notes (Signed)
Numeric Pain Rating Scale and Functional Assessment Average Pain 5   In the last MONTH (on 0-10 scale) has pain interfered with the following?  1. General activity like being  able to carry out your everyday physical activities such as walking, climbing stairs, carrying groceries, or moving a chair?  Rating(8-10)   +Driver, -BT, -Dye Allergies. Pain in LBP,has 2 spots on left sides one is higher that the other, 1 spot on the right side that hurts.

## 2020-03-06 NOTE — Progress Notes (Signed)
[name] - 74 y.o. male MRN [id number]  Date of birth: 1946-01-07  Office Visit Note: Visit Date: 02/07/2020 PCP: Chevis Pretty, FNP Referred by: Hassell Done, Mary-Margaret, *  Subjective: Chief Complaint  Patient presents with  . Lower Back - Pain   HPI:  [name] [name] is a 74 y.o. male who comes in today for planned repeat Bilateral L4-L5 Lumbar epidural steroid injection with fluoroscopic guidance.  The patient has failed conservative care including home exercise, medications, time and activity modification.  This injection will be diagnostic and hopefully therapeutic.  Please see requesting physician notes for further details and justification. Patient received more than 50% pain relief from prior injection.   Referring: Dr. Jean Rosenthal  Patient with chronic history of axial low back pain with intermittent flareups.  His current back pain has been ongoing now for a few months with failure of normal conservative care for him which is stretching and exercise and anti-inflammatory.  We have seen him on a very infrequent basis.  I saw him once in 2019 and once about a year ago for bilateral L4-5 intra-articular facet joint injections.  MRI from 2013 or 2014 does show facet arthropathy at L4-5 without any stenosis.  At this point we will repeat the injection he has no red flag complaints and failing conservative care otherwise.  Depending on the results or how this goes in the future is probably need an updated MRI at some point if is just severe enough to seek out injection.   ROS Otherwise per HPI.  Assessment & Plan: Visit Diagnoses:  1. Spondylosis without myelopathy or radiculopathy, lumbar region     Plan: No additional findings.   Meds & Orders:  Meds ordered this encounter  Medications  . methylPREDNISolone acetate (DEPO-MEDROL) injection 80 mg    Orders Placed This Encounter  Procedures  . Facet Injection  . XR C-ARM NO REPORT    Follow-up: Return if  symptoms worsen or fail to improve, for Consider new MRI.   Procedures: No procedures performed  Lumbar Facet Joint Intra-Articular Injection(s) with Fluoroscopic Guidance  Patient: [name] [name]      Date of Birth: 1945-10-20 MRN: [id number] PCP: Chevis Pretty, FNP      Visit Date: 02/07/2020   Universal Protocol:    Date/Time: 02/07/2020  Consent Given By: the patient  Position: PRONE   Additional Comments: Vital signs were monitored before and after the procedure. Patient was prepped and draped in the usual sterile fashion. The correct patient, procedure, and site was verified.   Injection Procedure Details:  Procedure Site One Meds Administered:  Meds ordered this encounter  Medications  . methylPREDNISolone acetate (DEPO-MEDROL) injection 80 mg     Laterality: Bilateral  Location/Site:  L4-L5  Needle size: 22 guage  Needle type: Spinal  Needle Placement: Articular  Findings:  -Comments: Excellent flow of contrast producing a partial arthrogram.  Procedure Details: The fluoroscope beam is vertically oriented in AP, and the inferior recess is visualized beneath the lower pole of the inferior apophyseal process, which represents the target point for needle insertion. When direct visualization is difficult the target point is located at the medial projection of the vertebral pedicle. The region overlying each aforementioned target is locally anesthetized with a 1 to 2 ml. volume of 1% Lidocaine without Epinephrine.   The spinal needle was inserted into each of the above mentioned facet joints using biplanar fluoroscopic guidance. A 0.25 to 0.5 ml. volume of Isovue-250 was injected  and a partial facet joint arthrogram was obtained. A single spot film was obtained of the resulting arthrogram.    One to 1.25 ml of the steroid/anesthetic solution was then injected into each of the facet joints noted above.   Additional Comments:  The patient tolerated the  procedure well Dressing: 2 x 2 sterile gauze and Band-Aid    Post-procedure details: Patient was observed during the procedure. Post-procedure instructions were reviewed.  Patient left the clinic in stable condition.     Clinical History: Lumbar Spine SOS MRI 12/28/2011  L4-5 facet arthropathy, mild bulging, degenerative changes     Objective:  VS:  HT:    WT:   BMI:     BP:(!) 144/68  HR:60bpm  TEMP: ( )  RESP:  Physical Exam Constitutional:      General: He is not in acute distress.    Appearance: Normal appearance. He is obese. He is not ill-appearing.  HENT:     Head: Normocephalic and atraumatic.     Right Ear: External ear normal.     Left Ear: External ear normal.  Eyes:     Extraocular Movements: Extraocular movements intact.  Cardiovascular:     Rate and Rhythm: Normal rate.     Pulses: Normal pulses.  Abdominal:     General: There is no distension.     Palpations: Abdomen is soft.  Musculoskeletal:        General: No tenderness or signs of injury.     Right lower leg: No edema.     Left lower leg: No edema.     Comments: Patient has good distal strength without clonus.Somewhat slow to rise from a seated position does have concordant low back pain with facet loading and extension.  Skin:    Findings: No erythema or rash.  Neurological:     General: No focal deficit present.     Mental Status: He is alert and oriented to person, place, and time.     Sensory: No sensory deficit.     Motor: No weakness or abnormal muscle tone.     Coordination: Coordination normal.  Psychiatric:        Mood and Affect: Mood normal.        Behavior: Behavior normal.      Imaging: No results found.

## 2020-03-06 NOTE — Procedures (Signed)
Lumbar Facet Joint Intra-Articular Injection(s) with Fluoroscopic Guidance  Patient: [name] [name]      Date of Birth: 1945/11/15 MRN: [id number] PCP: Chevis Pretty, FNP      Visit Date: 02/07/2020   Universal Protocol:    Date/Time: 02/07/2020  Consent Given By: the patient  Position: PRONE   Additional Comments: Vital signs were monitored before and after the procedure. Patient was prepped and draped in the usual sterile fashion. The correct patient, procedure, and site was verified.   Injection Procedure Details:  Procedure Site One Meds Administered:  Meds ordered this encounter  Medications  . methylPREDNISolone acetate (DEPO-MEDROL) injection 80 mg     Laterality: Bilateral  Location/Site:  L4-L5  Needle size: 22 guage  Needle type: Spinal  Needle Placement: Articular  Findings:  -Comments: Excellent flow of contrast producing a partial arthrogram.  Procedure Details: The fluoroscope beam is vertically oriented in AP, and the inferior recess is visualized beneath the lower pole of the inferior apophyseal process, which represents the target point for needle insertion. When direct visualization is difficult the target point is located at the medial projection of the vertebral pedicle. The region overlying each aforementioned target is locally anesthetized with a 1 to 2 ml. volume of 1% Lidocaine without Epinephrine.   The spinal needle was inserted into each of the above mentioned facet joints using biplanar fluoroscopic guidance. A 0.25 to 0.5 ml. volume of Isovue-250 was injected and a partial facet joint arthrogram was obtained. A single spot film was obtained of the resulting arthrogram.    One to 1.25 ml of the steroid/anesthetic solution was then injected into each of the facet joints noted above.   Additional Comments:  The patient tolerated the procedure well Dressing: 2 x 2 sterile gauze and Band-Aid    Post-procedure details: Patient was  observed during the procedure. Post-procedure instructions were reviewed.  Patient left the clinic in stable condition.

## 2020-04-09 ENCOUNTER — Encounter: Payer: Self-pay | Admitting: Nurse Practitioner

## 2020-04-09 ENCOUNTER — Ambulatory Visit (INDEPENDENT_AMBULATORY_CARE_PROVIDER_SITE_OTHER): Payer: Medicare Other | Admitting: Nurse Practitioner

## 2020-04-09 ENCOUNTER — Other Ambulatory Visit: Payer: Self-pay

## 2020-04-09 VITALS — BP 143/76 | HR 58 | Temp 98.3°F | Resp 20 | Ht 74.0 in | Wt 256.0 lb

## 2020-04-09 DIAGNOSIS — F3342 Major depressive disorder, recurrent, in full remission: Secondary | ICD-10-CM | POA: Diagnosis not present

## 2020-04-09 DIAGNOSIS — K219 Gastro-esophageal reflux disease without esophagitis: Secondary | ICD-10-CM

## 2020-04-09 DIAGNOSIS — Z6828 Body mass index (BMI) 28.0-28.9, adult: Secondary | ICD-10-CM | POA: Diagnosis not present

## 2020-04-09 DIAGNOSIS — G609 Hereditary and idiopathic neuropathy, unspecified: Secondary | ICD-10-CM

## 2020-04-09 DIAGNOSIS — S30860D Insect bite (nonvenomous) of lower back and pelvis, subsequent encounter: Secondary | ICD-10-CM

## 2020-04-09 DIAGNOSIS — Z789 Other specified health status: Secondary | ICD-10-CM

## 2020-04-09 DIAGNOSIS — J4520 Mild intermittent asthma, uncomplicated: Secondary | ICD-10-CM | POA: Diagnosis not present

## 2020-04-09 DIAGNOSIS — I10 Essential (primary) hypertension: Secondary | ICD-10-CM

## 2020-04-09 DIAGNOSIS — E782 Mixed hyperlipidemia: Secondary | ICD-10-CM | POA: Diagnosis not present

## 2020-04-09 DIAGNOSIS — W57XXXD Bitten or stung by nonvenomous insect and other nonvenomous arthropods, subsequent encounter: Secondary | ICD-10-CM | POA: Diagnosis not present

## 2020-04-09 MED ORDER — CITALOPRAM HYDROBROMIDE 20 MG PO TABS: 20.0000 mg | ORAL_TABLET | Freq: Every day | ORAL | 1 refills | Status: DC

## 2020-04-09 MED ORDER — BUDESONIDE-FORMOTEROL FUMARATE 160-4.5 MCG/ACT IN AERO: 2.0000 | INHALATION_SPRAY | Freq: Two times a day (BID) | RESPIRATORY_TRACT | 3 refills | Status: DC

## 2020-04-09 MED ORDER — ATENOLOL 25 MG PO TABS: 25.0000 mg | ORAL_TABLET | Freq: Every day | ORAL | 1 refills | Status: DC

## 2020-04-09 MED ORDER — MONTELUKAST SODIUM 10 MG PO TABS: 10.0000 mg | ORAL_TABLET | Freq: Every day | ORAL | 1 refills | Status: DC

## 2020-04-09 MED ORDER — AMLODIPINE BESYLATE 5 MG PO TABS: 5.0000 mg | ORAL_TABLET | Freq: Every day | ORAL | 1 refills | Status: DC

## 2020-04-09 MED ORDER — PRAVASTATIN SODIUM 20 MG PO TABS: 10.0000 mg | ORAL_TABLET | Freq: Every evening | ORAL | 1 refills | Status: DC

## 2020-04-09 NOTE — Progress Notes (Signed)
Subjective:    Patient ID: [name] [name], male    DOB: 09/30/1945, 74 y.o.   MRN: [id number]   Chief Complaint: Medical Management of Chronic Issues    HPI:  1. Essential hypertension No c/o chest pain, sob or headache. Does not check blood pressure at home. BP Readings from Last 3 Encounters:  04/09/20 (!) 143/76  02/07/20 (!) 144/68  10/11/19 (!) 146/71     2. Mixed hyperlipidemia Tries to watch diet but doe sno dedicated exercise. Lab Results  Component Value Date   CHOL 176 10/11/2019   HDL 41 10/11/2019   LDLCALC 87 10/11/2019   LDLDIRECT 92.8 08/28/2014   TRIG 293 (H) 10/11/2019   CHOLHDL 4.3 10/11/2019     3. Statin intolerance ldl are pretty good- patient cannot tolerate statin any stronger then pravachol, due to muscle aches Lab Results  Component Value Date   CHOL 176 10/11/2019   HDL 41 10/11/2019   LDLCALC 87 10/11/2019   LDLDIRECT 92.8 08/28/2014   TRIG 293 (H) 10/11/2019   CHOLHDL 4.3 10/11/2019   The 10-year ASCVD risk score Mikey Bussing DC Jr., et al., 2013) is: 30.5%   Values used to calculate the score:     Age: 41 years     Sex: Male     Is Non-Hispanic African American: No     Diabetic: No     Tobacco smoker: No     Systolic Blood Pressure: 382 mmHg     Is BP treated: Yes     HDL Cholesterol: 41 mg/dL     Total Cholesterol: 176 mg/dL   4. Gastroesophageal reflux disease, unspecified whether esophagitis present denies any symptoms. Is currently on no meds  5. Hereditary and idiopathic peripheral neuropathy [place] in feet and is doing well.  6. Recurrent major depressive disorder, in full remission ([place]) Is currently on celexa and is doing well. Depression screen [facility] 2/9 04/09/2020 10/23/2019 10/11/2019  Decreased Interest 0 0 0  Down, Depressed, Hopeless 0 0 0  PHQ - 2 Score 0 0 0  Altered sleeping - - -  Tired, decreased energy - - -  Change in appetite - - -  Feeling bad or failure about yourself  - - -  Trouble concentrating - - -    Moving slowly or fidgety/restless - - -  Suicidal thoughts - - -  PHQ-9 Score - - -     7. Mild intermittent chronic asthma without complication Is currently doing well. Is on symbicort  8. BMI 28.0-28.9,adult Weight is up 5lbs Wt Readings from Last 3 Encounters:  04/09/20 (!) 256 lb (116.1 kg)  10/11/19 251 lb (113.9 kg)  05/11/19 245 lb 3.2 oz (111.2 kg)   BMI Readings from Last 3 Encounters:  04/09/20 32.87 kg/m  10/11/19 32.23 kg/m  05/11/19 31.06 kg/m       Outpatient Encounter Medications as of 04/09/2020  Medication Sig  . amLODipine (NORVASC) 5 MG tablet Take 1 tablet (5 mg total) by mouth daily.  Marland Kitchen aspirin EC 81 MG tablet Take 81 mg by mouth daily.  Marland Kitchen atenolol (TENORMIN) 25 MG tablet Take 1 tablet (25 mg total) by mouth daily.  . budesonide-formoterol (SYMBICORT) 160-4.5 MCG/ACT inhaler Inhale 2 puffs into the lungs 2 (two) times daily.  . cetirizine (ZYRTEC) 10 MG tablet Take 10 mg by mouth daily as needed.   . cholecalciferol (VITAMIN D) 1000 UNITS tablet Take 1,000 Units by mouth daily.  . citalopram (CELEXA) 20 MG tablet Take  1 tablet (20 mg total) by mouth daily.  . cyanocobalamin (,VITAMIN B-12,) 1000 MCG/ML injection Inject 1 mL (1,000 mcg total) into the muscle every 30 (thirty) days.  Marland Kitchen loperamide (IMODIUM) 2 MG capsule Take 2 mg by mouth daily.  . montelukast (SINGULAIR) 10 MG tablet Take 1 tablet (10 mg total) by mouth daily.  . naproxen (NAPROSYN) 500 MG tablet Take 500 mg by mouth daily.  . pravastatin (PRAVACHOL) 20 MG tablet Take 0.5 tablets (10 mg total) by mouth every evening.  . sildenafil (REVATIO) 20 MG tablet Take 1 tablet (20 mg total) by mouth 3 (three) times daily as needed.   No facility-administered encounter medications on file as of 04/09/2020.    Past Surgical History:  Procedure Laterality Date  . CARDIAC CATHETERIZATION  1997  . EYE SURGERY Bilateral 2020   cataracts removed  . HIATAL HERNIA REPAIR    . LAPAROSCOPIC GASTRIC  BANDING  2000   [facility]   . MENISCUS REPAIR Left 2012  . ROBOT ASSISTED LAPAROSCOPIC RADICAL PROSTATECTOMY    . ROTATOR CUFF REPAIR Right 2019    Family History  Problem Relation Age of Onset  . Heart disease Other        Early Cardiac disease for uncle is their 56's as well as deceased.  Marland Kitchen Heart failure Mother   . Dementia Mother 17  . Cancer Father 63       testicular, lung  . Esophageal cancer Brother     New complaints: None today  Social history: Lives with his wife  Controlled substance contract: n/a    Review of Systems  Constitutional: Negative for diaphoresis.  Eyes: Negative for pain.  Respiratory: Negative for shortness of breath.   Cardiovascular: Negative for chest pain, palpitations and leg swelling.  Gastrointestinal: Negative for abdominal pain.  Endocrine: Negative for polydipsia.  Skin: Negative for rash.  Neurological: Negative for dizziness, weakness and headaches.  Hematological: Does not bruise/bleed easily.  All other systems reviewed and are negative.      Objective:   Physical Exam Vitals and nursing note reviewed.  Constitutional:      Appearance: Normal appearance. He is well-developed.  HENT:     Head: Normocephalic.     Nose: Nose normal.  Eyes:     Pupils: Pupils are equal, round, and reactive to light.  Neck:     Thyroid: No thyroid mass or thyromegaly.     Vascular: No carotid bruit or JVD.     Trachea: Phonation normal.  Cardiovascular:     Rate and Rhythm: Normal rate and regular rhythm.  Pulmonary:     Effort: Pulmonary effort is normal. No respiratory distress.     Breath sounds: Normal breath sounds.  Abdominal:     General: Bowel sounds are normal.     Palpations: Abdomen is soft.     Tenderness: There is no abdominal tenderness.  Musculoskeletal:        General: Normal range of motion.     Cervical back: Normal range of motion and neck supple.  Lymphadenopathy:     Cervical: No cervical adenopathy.  Skin:     General: Skin is warm and dry.  Neurological:     Mental Status: He is alert and oriented to person, place, and time.  Psychiatric:        Behavior: Behavior normal.        Thought Content: Thought content normal.        Judgment: Judgment normal.  BP (!) 143/76   Pulse 58   Temp 98.3 F (36.8 C) (Temporal)   Resp 20   Ht 6\' 2"  (1.88 m)   Wt (!) 256 lb (116.1 kg)   SpO2 97%   BMI 32.87 kg/m         Assessment & Plan:  [name] [name] comes in today with chief complaint of Medical Management of Chronic Issues   Diagnosis and orders addressed:  1. Essential hypertension Low sodium diet - amLODipine (NORVASC) 5 MG tablet; Take 1 tablet (5 mg total) by mouth daily.  Dispense: 90 tablet; Refill: 1 - atenolol (TENORMIN) 25 MG tablet; Take 1 tablet (25 mg total) by mouth daily.  Dispense: 90 tablet; Refill: 1 - budesonide-formoterol (SYMBICORT) 160-4.5 MCG/ACT inhaler; Inhale 2 puffs into the lungs 2 (two) times daily.  Dispense: 3 Inhaler; Refill: 3  2. Mixed hyperlipidemia Low fat diet - pravastatin (PRAVACHOL) 20 MG tablet; Take 0.5 tablets (10 mg total) by mouth every evening.  Dispense: 90 tablet; Refill: 1  3. Statin intolerance  4. Gastroesophageal reflux disease, unspecified whether esophagitis present Avoid spicy foods Do not eat 2 hours prior to bedtime  5. Hereditary and idiopathic peripheral neuropathy Do not g obareefooted  6. Recurrent major depressive disorder, in full remission ([place]) Stress management - citalopram (CELEXA) 20 MG tablet; Take 1 tablet (20 mg total) by mouth daily.  Dispense: 90 tablet; Refill: 1  7. Mild intermittent chronic asthma without complication Use symbicort daily - montelukast (SINGULAIR) 10 MG tablet; Take 1 tablet (10 mg total) by mouth daily.  Dispense: 90 tablet; Refill: 1  8. BMI 28.0-28.9,adult Discussed diet and exercise for person with BMI >25 Will recheck weight in 3-6 months  9. Tick bites Labs pending  Labs  pending Health Maintenance reviewed Diet and exercise encouraged  Follow up plan: 6 months   Mary-Margaret Hassell Done, FNP

## 2020-04-09 NOTE — Patient Instructions (Signed)
Lyme Disease Lyme disease is an infection that can affect many parts of the body, including the skin, joints, and nervous system. It is a bacterial infection that starts from the bite of an infected tick. Over time, the infection can worsen, and some of the symptoms are similar to the flu. If Lyme disease is not treated, it may cause joint pain, swelling, numbness, problems thinking, fatigue, muscle weakness, and other problems. What are the causes? This condition is caused by bacteria called Borrelia burgdorferi.  You can get Lyme disease by being bitten by an infected tick.  Only black-legged, or Ixodes, ticks that are infected with the bacteria can cause Lyme disease.  The tick must be attached to your skin for a certain period of time to pass along the infection. This is usually 36-48 hours.  Deer often carry infected ticks. What increases the risk? The following factors may make you more likely to develop this condition:  Living in or visiting these areas in the [place] ? [place]. ? The [place]. ? [place].  Spending time in wooded or grassy areas.  Being outdoors with exposed skin.  Camping, gardening, hiking, fishing, hunting, or working outdoors.  Failing to remove a tick from your skin. What are the signs or symptoms? Symptoms of this condition may include:  Chills and fever.  Headache.  Fatigue.  General achiness.  Muscle pain.  Joint pain, often in the knees.  A round, red rash that surrounds the center of the tick bite. The center of the rash may be blood colored or have tiny blisters.  Swollen lymph glands.  Stiff neck. How is this diagnosed? This condition is diagnosed based on:  Your symptoms and medical history.  A physical exam.  A blood test. How is this treated? The main treatment for this condition is antibiotic medicine, which is usually taken by mouth (orally).  The length of treatment depends on how soon after a  tick bite you begin taking the medicine. In some cases, treatment is necessary for several weeks.  If the infection is severe, antibiotics may need to be given through an IV that is inserted into one of your veins. Follow these instructions at home:  Take over-the-counter and prescription medicines only as told by your health care provider. Finish all antibiotic medicine, even when you start to feel better.  Ask your health care provider about taking a probiotic in between doses of your antibiotic to help avoid an upset stomach or diarrhea.  Check with your health care provider before supplementing your treatment. Many alternative therapies have not been proven and may be harmful to you.  Keep all follow-up visits as told by your health care provider. This is important. How is this prevented? You can become reinfected if you get another tick bite from an infected tick. Take these steps to help prevent an infection:  Cover your skin with light-colored clothing when you are outdoors in the spring and summer months.  Spray clothing and skin with bug spray. The spray should be 20-30% DEET. You can also treat clothing with permethrin, and let it dry before you wear it. Do not apply permethrin directly to your skin. Permethrin can also be used to treat camping gear and boots. Always read and follow the instructions that come with a bug spray or insecticide.  Avoid wooded, grassy, and shaded areas.  Remove yard litter, brush, trash, and plants that attract deer and rodents.  Check yourself for ticks when   you come indoors.  Wash clothing worn each day.  Shower after spending time outdoors.  Check your pets for ticks before they come inside.  If you find a tick attached to your skin: ? Remove it with tweezers. ? Clean your hands and the bite area with rubbing alcohol or soap and water. ? Dispose of the tick by putting it in rubbing alcohol, putting it in a sealed bag or container, or  flushing it down the toilet. ? You may choose to save the tick in a sealed container if you wish for it to be tested at a later time. Pregnant women should take special care to avoid tick bites because it is possible that the infection may be passed along to the fetus. Contact a health care provider if:  You have symptoms after treatment.  You have removed a tick and want to bring it to your health care provider for testing. Get help right away if:  You have an irregular heartbeat.  You have chest pain.  You have nerve pain.  Your face feels numb.  You develop the following: ? A stiff neck. ? A severe headache. ? Severe nausea and vomiting. ? Sensitivity to light. Summary  Lyme disease is an infection that can affect many parts of the body, including the skin, joints, and nervous system.  This condition is caused by bacteria called Borrelia burgdorferi.  You can get Lyme disease by being bitten by an infected tick.  The main treatment for this condition is antibiotic medicine. This information is not intended to replace advice given to you by your health care provider. Make sure you discuss any questions you have with your health care provider. Document Revised: 12/23/2018 Document Reviewed: 11/17/2018 Elsevier Patient Education  2020 Elsevier Inc.  

## 2020-04-11 LAB — LIPID PANEL
Chol/HDL Ratio: 4.4 ratio (ref 0.0–5.0)
Cholesterol, Total: 202 mg/dL — ABNORMAL HIGH (ref 100–199)
HDL: 46 mg/dL (ref >39)
LDL Chol Calc (NIH): 123 mg/dL — ABNORMAL HIGH (ref 0–99)
Triglycerides: 187 mg/dL — ABNORMAL HIGH (ref 0–149)
VLDL Cholesterol Cal: 33 mg/dL (ref 5–40)

## 2020-04-11 LAB — CBC WITH DIFFERENTIAL/PLATELET
Basophils Absolute: 0.1 10*3/uL (ref 0.0–0.2)
Basos: 1 %
EOS (ABSOLUTE): 0.2 10*3/uL (ref 0.0–0.4)
Eos: 2 %
Hematocrit: 42.3 % (ref 37.5–51.0)
Hemoglobin: 14.6 g/dL (ref 13.0–17.7)
Immature Grans (Abs): 0 10*3/uL (ref 0.0–0.1)
Immature Granulocytes: 0 %
Lymphocytes Absolute: 3.5 10*3/uL — ABNORMAL HIGH (ref 0.7–3.1)
Lymphs: 40 %
MCH: 32.3 pg (ref 26.6–33.0)
MCHC: 34.5 g/dL (ref 31.5–35.7)
MCV: 94 fL (ref 79–97)
Monocytes Absolute: 0.5 10*3/uL (ref 0.1–0.9)
Monocytes: 6 %
Neutrophils Absolute: 4.6 10*3/uL (ref 1.4–7.0)
Neutrophils: 51 %
Platelets: 231 10*3/uL (ref 150–450)
RBC: 4.52 x10E6/uL (ref 4.14–5.80)
RDW: 13.4 % (ref 11.6–15.4)
WBC: 8.9 10*3/uL (ref 3.4–10.8)

## 2020-04-11 LAB — CMP14+EGFR
ALT: 15 IU/L (ref 0–44)
AST: 19 IU/L (ref 0–40)
Albumin/Globulin Ratio: 1.8 (ref 1.2–2.2)
Albumin: 4.4 g/dL (ref 3.7–4.7)
Alkaline Phosphatase: 81 IU/L (ref 48–121)
BUN/Creatinine Ratio: 14 (ref 10–24)
BUN: 18 mg/dL (ref 8–27)
Bilirubin Total: 0.3 mg/dL (ref 0.0–1.2)
CO2: 26 mmol/L (ref 20–29)
Calcium: 9.4 mg/dL (ref 8.6–10.2)
Chloride: 99 mmol/L (ref 96–106)
Creatinine, Ser: 1.33 mg/dL — ABNORMAL HIGH (ref 0.76–1.27)
GFR calc Af Amer: 61 mL/min/{1.73_m2} (ref >59)
GFR calc non Af Amer: 53 mL/min/{1.73_m2} — ABNORMAL LOW (ref >59)
Globulin, Total: 2.5 g/dL (ref 1.5–4.5)
Glucose: 97 mg/dL (ref 65–99)
Potassium: 5 mmol/L (ref 3.5–5.2)
Sodium: 136 mmol/L (ref 134–144)
Total Protein: 6.9 g/dL (ref 6.0–8.5)

## 2020-04-11 LAB — ROCKY MTN SPOTTED FVR ABS PNL(IGG+IGM)
RMSF IgG: NEGATIVE
RMSF IgM: 0.17 index (ref 0.00–0.89)

## 2020-04-11 LAB — LYME AB/WESTERN BLOT REFLEX
LYME DISEASE AB, QUANT, IGM: <0.8 index (ref 0.00–0.79)
Lyme IgG/IgM Ab: <0.91 {ISR} (ref 0.00–0.90)

## 2020-05-14 DIAGNOSIS — L820 Inflamed seborrheic keratosis: Secondary | ICD-10-CM | POA: Diagnosis not present

## 2020-05-14 DIAGNOSIS — L570 Actinic keratosis: Secondary | ICD-10-CM | POA: Diagnosis not present

## 2020-05-14 DIAGNOSIS — L579 Skin changes due to chronic exposure to nonionizing radiation, unspecified: Secondary | ICD-10-CM | POA: Diagnosis not present

## 2020-05-14 DIAGNOSIS — D225 Melanocytic nevi of trunk: Secondary | ICD-10-CM | POA: Diagnosis not present

## 2020-05-14 DIAGNOSIS — L814 Other melanin hyperpigmentation: Secondary | ICD-10-CM | POA: Diagnosis not present

## 2020-05-14 DIAGNOSIS — Z85828 Personal history of other malignant neoplasm of skin: Secondary | ICD-10-CM | POA: Diagnosis not present

## 2020-05-14 DIAGNOSIS — D1801 Hemangioma of skin and subcutaneous tissue: Secondary | ICD-10-CM | POA: Diagnosis not present

## 2020-05-22 ENCOUNTER — Telehealth: Payer: Self-pay | Admitting: Nurse Practitioner

## 2020-05-22 ENCOUNTER — Other Ambulatory Visit: Payer: Self-pay

## 2020-05-22 DIAGNOSIS — E782 Mixed hyperlipidemia: Secondary | ICD-10-CM

## 2020-05-22 MED ORDER — PRAVASTATIN SODIUM 20 MG PO TABS: 20.0000 mg | ORAL_TABLET | Freq: Every evening | ORAL | 1 refills | Status: DC

## 2020-05-22 NOTE — Progress Notes (Signed)
RX for pravastatin 1 po qd sent into pharmacy per note from [facility]  on recent labs changing the dose

## 2020-05-22 NOTE — Telephone Encounter (Signed)
New RX sent into pharmacy per note in chart - patient aware

## 2020-05-22 NOTE — Telephone Encounter (Signed)
  Prescription Request  05/22/2020  What is the name of the medication or equipment? pravastatin (PRAVACHOL) 20 MG tablet  Have you contacted your pharmacy to request a refill? (if applicable) pravastatin (PRAVACHOL) 20 MG tablet---no went to pharmacy and did not have the refill instructions for a whole tablet.   Which pharmacy would you like this sent to? [facility]   Patient notified that their request is being sent to the clinical staff for review and that they should receive a response within 2 business days.

## 2020-05-27 ENCOUNTER — Ambulatory Visit: Payer: Medicare Other | Admitting: Orthopaedic Surgery

## 2020-05-28 ENCOUNTER — Encounter: Payer: Self-pay | Admitting: Orthopaedic Surgery

## 2020-05-28 ENCOUNTER — Ambulatory Visit (INDEPENDENT_AMBULATORY_CARE_PROVIDER_SITE_OTHER): Payer: Medicare Other | Admitting: Orthopaedic Surgery

## 2020-05-28 DIAGNOSIS — R221 Localized swelling, mass and lump, neck: Secondary | ICD-10-CM | POA: Diagnosis not present

## 2020-05-28 DIAGNOSIS — M1712 Unilateral primary osteoarthritis, left knee: Secondary | ICD-10-CM

## 2020-05-28 DIAGNOSIS — M545 Low back pain, unspecified: Secondary | ICD-10-CM

## 2020-05-28 DIAGNOSIS — M25562 Pain in left knee: Secondary | ICD-10-CM | POA: Diagnosis not present

## 2020-05-28 DIAGNOSIS — G8929 Other chronic pain: Secondary | ICD-10-CM | POA: Diagnosis not present

## 2020-05-28 MED ORDER — LIDOCAINE HCL 1 % IJ SOLN
3.0000 mL | INTRAMUSCULAR | Status: AC | PRN
  Administered 2020-05-28: 3 mL

## 2020-05-28 MED ORDER — METHYLPREDNISOLONE ACETATE 40 MG/ML IJ SUSP
40.0000 mg | INTRAMUSCULAR | Status: AC | PRN
  Administered 2020-05-28: 40 mg via INTRA_ARTICULAR

## 2020-05-28 NOTE — Progress Notes (Signed)
Office Visit Note   Patient: [name] [name]           Date of Birth: [birth year]           MRN: [id number] Visit Date: 05/28/2020              Requested by: Chevis Pretty, [address] [place],  [place] [address] PCP: Chevis Pretty, FNP   Assessment & Plan: Visit Diagnoses:  1. Unilateral primary osteoarthritis, left knee   2. Chronic pain of left knee   3. Mass of left side of neck   4. Chronic bilateral low back pain without sciatica     Plan: Per the patient's wishes I did provide a steroid injection in his left knee today which he tolerated well.  We will set up an MRI with and with contrast of the lower cervical/upper thoracic spine to evaluate this mass.  We will also set him up for an appoint with Dr. Ernestina Patches for bilateral L4/L5 facet joint injections.  We will see him in follow-up with the results of the MRI of the cervical spine.  All questions and concerns were answered and addressed.  Follow-Up Instructions: Return in about 2 weeks (around 06/11/2020).   Orders:  Orders Placed This Encounter  Procedures  . Large Joint Inj   No orders of the defined types were placed in this encounter.     Procedures: Large Joint Inj: L knee on 05/28/2020 8:45 AM Indications: diagnostic evaluation and pain Details: 22 G 1.5 in needle, superolateral approach  Arthrogram: No  Medications: 3 mL lidocaine 1 %; 40 mg methylPREDNISolone acetate 40 MG/ML Outcome: tolerated well, no immediate complications Procedure, treatment alternatives, risks and benefits explained, specific risks discussed. Consent was given by the patient. Immediately prior to procedure a time out was called to verify the correct patient, procedure, equipment, support staff and site/side marked as required. Patient was prepped and draped in the usual sterile fashion.       Clinical Data: No additional findings.   Subjective: Chief Complaint  Patient presents with  . Left Knee -  Injections  . Neck - Pain  The patient comes in with 3 different chief complaints today.  His main complaint is neck pain with a palpable mass in the soft tissue at the base of the cervical spine to the left side.  He says it is painful when he drives and is noticed that is been going over the last month.  He is concerned about this given a family member who had a neck mass that was related to lung cancer.  He is also requesting a steroid injection in his left knee today due to chronic left knee pain.  He would also like a referral back to Dr. Ernestina Patches for repeat facet joint injections at L4-L5 bilaterally which has been done in the past by Dr. Ernestina Patches and was very helpful.  He denies any numbness and tingling in his hands or feet or any acute changes in medical status.  HPI  Review of Systems He currently denies any headache, chest pain, shortness of breath, fever, chills, nausea, vomiting  Objective: Vital Signs: There were no vitals taken for this visit.  Physical Exam He is alert and oriented x3 and in no acute distress Ortho Exam Examination of his neck does show a palpable mass in the soft tissues that is mobile to the left side of the lower cervical and upper thoracic spine.  It is painful to him but  not red.  Examination of his left knee shows no effusion but varus malalignment and painful medial lateral joint lines with good range of motion.  Examination of his back shows pain in the lower lumbar spine medially and laterally. Specialty Comments:  No specialty comments available.  Imaging: No results found.   PMFS History: Patient Active Problem List   Diagnosis Date Noted  . Statin intolerance 12/27/2018  . BMI 28.0-28.9,adult 11/01/2018  . H/O arthroscopy of shoulder 02/10/2018  . Unilateral primary osteoarthritis, left knee 11/25/2016  . Unilateral primary osteoarthritis, right knee 11/25/2016  . Essential hypertension 06/28/2015  . H/O prostate cancer 06/28/2015  .  Hereditary and idiopathic peripheral neuropathy 04/17/2015  . Carpal tunnel syndrome 04/17/2015  . Hyperlipemia 06/02/2013  . Asthma, chronic 06/02/2013  . Depression 06/02/2013  . GERD 06/04/2009  . LEG PAIN, BILATERAL 06/04/2009  . PERCUTANEOUS TRANSLUMINAL CORONARY ANGIOPLASTY, HX OF 06/04/2009   Past Medical History:  Diagnosis Date  . Asthma, chronic 06/02/2013  . Carpal tunnel syndrome 04/17/2015   Bilateral  . Depression 06/02/2013  . DYSPNEA ON EXERTION 06/04/2009   Qualifier: Diagnosis of  By: Domenic Polite, MD, Phillips Hay   . GERD (gastroesophageal reflux disease)   . Hereditary and idiopathic peripheral neuropathy 04/17/2015  . Hyperlipemia 06/02/2013  . LEG PAIN, BILATERAL 06/04/2009   Qualifier: Diagnosis of  By: Domenic Polite, MD, Phillips Hay   . PERCUTANEOUS TRANSLUMINAL CORONARY ANGIOPLASTY, HX OF 06/04/2009   Qualifier: Diagnosis of  By: Domenic Polite, MD, Phillips Hay   . Prostate CA [facility])     Family History  Problem Relation Age of Onset  . Heart disease Other        Early Cardiac disease for uncle is their 34's as well as deceased.  Marland Kitchen Heart failure Mother   . Dementia Mother 19  . Cancer Father 69       testicular, lung  . Esophageal cancer Brother     Past Surgical History:  Procedure Laterality Date  . CARDIAC CATHETERIZATION  1997  . EYE SURGERY Bilateral 2020   cataracts removed  . HIATAL HERNIA REPAIR    . LAPAROSCOPIC GASTRIC BANDING  2000   [facility]   . MENISCUS REPAIR Left 2012  . ROBOT ASSISTED LAPAROSCOPIC RADICAL PROSTATECTOMY    . ROTATOR CUFF REPAIR Right 2019   Social History   Occupational History  . Occupation: Retired    Comment: Nature conservation officer  Tobacco Use  . Smoking status: Former Smoker    Packs/day: 1.00    Years: 20.00    Pack years: 20.00    Types: Cigarettes    Quit date: 01/19/1996    Years since quitting: 24.3  . Smokeless tobacco: Never Used  Vaping Use  . Vaping Use: Never used  Substance and Sexual Activity  .  Alcohol use: Yes    Alcohol/week: 2.0 standard drinks    Types: 2 Cans of beer per week  . Drug use: No  . Sexual activity: Yes

## 2020-05-29 ENCOUNTER — Other Ambulatory Visit: Payer: Self-pay

## 2020-05-29 ENCOUNTER — Telehealth: Payer: Self-pay | Admitting: Physical Medicine and Rehabilitation

## 2020-05-29 DIAGNOSIS — R221 Localized swelling, mass and lump, neck: Secondary | ICD-10-CM

## 2020-05-29 DIAGNOSIS — G8929 Other chronic pain: Secondary | ICD-10-CM

## 2020-05-29 DIAGNOSIS — M545 Low back pain, unspecified: Secondary | ICD-10-CM

## 2020-05-29 NOTE — Telephone Encounter (Signed)
Saw Dr. Ninfa Linden yesterday and looks like referral is going to be placed.

## 2020-05-29 NOTE — Telephone Encounter (Signed)
Just put it in

## 2020-05-29 NOTE — Telephone Encounter (Signed)
Patient's wife Steva Ready called requesting an appointment for her husband. Please call patient at [phone] to set appt.

## 2020-05-29 NOTE — Telephone Encounter (Signed)
Please advise 

## 2020-06-04 ENCOUNTER — Encounter: Payer: Self-pay | Admitting: Physical Medicine and Rehabilitation

## 2020-06-04 ENCOUNTER — Ambulatory Visit (INDEPENDENT_AMBULATORY_CARE_PROVIDER_SITE_OTHER): Payer: Medicare Other | Admitting: Physical Medicine and Rehabilitation

## 2020-06-04 ENCOUNTER — Ambulatory Visit: Payer: Self-pay

## 2020-06-04 ENCOUNTER — Other Ambulatory Visit: Payer: Self-pay

## 2020-06-04 VITALS — BP 144/66 | HR 66

## 2020-06-04 DIAGNOSIS — M47816 Spondylosis without myelopathy or radiculopathy, lumbar region: Secondary | ICD-10-CM | POA: Diagnosis not present

## 2020-06-04 MED ORDER — METHYLPREDNISOLONE ACETATE 80 MG/ML IJ SUSP
80.0000 mg | Freq: Once | INTRAMUSCULAR | Status: AC
  Administered 2020-06-04: 80 mg

## 2020-06-04 NOTE — Procedures (Signed)
Lumbar Facet Joint Intra-Articular Injection(s) with Fluoroscopic Guidance  Patient: [name] [name]      Date of Birth: Jul 08, 1946 MRN: [id number] PCP: Chevis Pretty, FNP      Visit Date: 06/04/2020   Universal Protocol:    Date/Time: 06/04/2020  Consent Given By: the patient  Position: PRONE   Additional Comments: Vital signs were monitored before and after the procedure. Patient was prepped and draped in the usual sterile fashion. The correct patient, procedure, and site was verified.   Injection Procedure Details:  Procedure Site One Meds Administered:  Meds ordered this encounter  Medications  . methylPREDNISolone acetate (DEPO-MEDROL) injection 80 mg     Laterality: Bilateral  Location/Site:  L4-L5  Needle size: 22 guage  Needle type: Spinal  Needle Placement: Articular  Findings:  -Comments: Excellent flow of contrast producing a partial arthrogram.  Procedure Details: The fluoroscope beam is vertically oriented in AP, and the inferior recess is visualized beneath the lower pole of the inferior apophyseal process, which represents the target point for needle insertion. When direct visualization is difficult the target point is located at the medial projection of the vertebral pedicle. The region overlying each aforementioned target is locally anesthetized with a 1 to 2 ml. volume of 1% Lidocaine without Epinephrine.   The spinal needle was inserted into each of the above mentioned facet joints using biplanar fluoroscopic guidance. A 0.25 to 0.5 ml. volume of Isovue-250 was injected and a partial facet joint arthrogram was obtained. A single spot film was obtained of the resulting arthrogram.    One to 1.25 ml of the steroid/anesthetic solution was then injected into each of the facet joints noted above.   Additional Comments:  The patient tolerated the procedure well Dressing: 2 x 2 sterile gauze and Band-Aid    Post-procedure details: Patient was  observed during the procedure. Post-procedure instructions were reviewed.  Patient left the clinic in stable condition.

## 2020-06-04 NOTE — Progress Notes (Signed)
[name] [name] - 74 y.o. male MRN [id number]  Date of birth: 1945/10/09  Office Visit Note: Visit Date: 06/04/2020 PCP: Chevis Pretty, FNP Referred by: Hassell Done, Mary-Margaret, *  Subjective: Chief Complaint  Patient presents with  . Lower Back - Pain  . Left Leg - Pain   HPI:  [name] [name] is a 74 y.o. male who comes in today for planned repeat Bilateral L4-L5 Lumbar facet/medial branch block with fluoroscopic guidance.  The patient has failed conservative care including home exercise, medications, time and activity modification.  This injection will be diagnostic and hopefully therapeutic.  Please see requesting physician notes for further details and justification.  Exam shows concordant low back pain with facet joint loading and extension. Patient received more than 80% pain relief from prior injection.     Referring:Dr. Jean Rosenthal    ROS Otherwise per HPI.  Assessment & Plan: Visit Diagnoses:  1. Spondylosis without myelopathy or radiculopathy, lumbar region     Plan: No additional findings.   Meds & Orders:  Meds ordered this encounter  Medications  . methylPREDNISolone acetate (DEPO-MEDROL) injection 80 mg    Orders Placed This Encounter  Procedures  . Facet Injection  . XR C-ARM NO REPORT    Follow-up: Return if symptoms worsen or fail to improve.   Procedures: No procedures performed  Lumbar Facet Joint Intra-Articular Injection(s) with Fluoroscopic Guidance  Patient: [name] [name]      Date of Birth: 1945-11-06 MRN: [id number] PCP: Chevis Pretty, FNP      Visit Date: 06/04/2020   Universal Protocol:    Date/Time: 06/04/2020  Consent Given By: the patient  Position: PRONE   Additional Comments: Vital signs were monitored before and after the procedure. Patient was prepped and draped in the usual sterile fashion. The correct patient, procedure, and site was verified.   Injection Procedure Details:  Procedure Site  One Meds Administered:  Meds ordered this encounter  Medications  . methylPREDNISolone acetate (DEPO-MEDROL) injection 80 mg     Laterality: Bilateral  Location/Site:  L4-L5  Needle size: 22 guage  Needle type: Spinal  Needle Placement: Articular  Findings:  -Comments: Excellent flow of contrast producing a partial arthrogram.  Procedure Details: The fluoroscope beam is vertically oriented in AP, and the inferior recess is visualized beneath the lower pole of the inferior apophyseal process, which represents the target point for needle insertion. When direct visualization is difficult the target point is located at the medial projection of the vertebral pedicle. The region overlying each aforementioned target is locally anesthetized with a 1 to 2 ml. volume of 1% Lidocaine without Epinephrine.   The spinal needle was inserted into each of the above mentioned facet joints using biplanar fluoroscopic guidance. A 0.25 to 0.5 ml. volume of Isovue-250 was injected and a partial facet joint arthrogram was obtained. A single spot film was obtained of the resulting arthrogram.    One to 1.25 ml of the steroid/anesthetic solution was then injected into each of the facet joints noted above.   Additional Comments:  The patient tolerated the procedure well Dressing: 2 x 2 sterile gauze and Band-Aid    Post-procedure details: Patient was observed during the procedure. Post-procedure instructions were reviewed.  Patient left the clinic in stable condition.      Clinical History: Lumbar Spine SOS MRI 12/28/2011  L4-5 facet arthropathy, mild bulging, degenerative changes     Objective:  VS:  HT:    WT:   BMI:  BP:(!) 144/66  HR:66bpm  TEMP: ( )  RESP:  Physical Exam Constitutional:      General: He is not in acute distress.    Appearance: Normal appearance. He is not ill-appearing.  HENT:     Head: Normocephalic and atraumatic.     Right Ear: External ear normal.      Left Ear: External ear normal.  Eyes:     Extraocular Movements: Extraocular movements intact.  Cardiovascular:     Rate and Rhythm: Normal rate.     Pulses: Normal pulses.  Abdominal:     General: There is no distension.     Palpations: Abdomen is soft.  Musculoskeletal:        General: No tenderness or signs of injury.     Right lower leg: No edema.     Left lower leg: No edema.     Comments: Patient has good distal strength without clonus.Patient somewhat slow to rise from a seated position to full extension.  There is concordant low back pain with facet loading and lumbar spine extension rotation.  There are no definitive trigger points but the patient is somewhat tender across the lower back and PSIS.  There is no pain with hip rotation.   Skin:    Findings: No erythema or rash.  Neurological:     General: No focal deficit present.     Mental Status: He is alert and oriented to person, place, and time.     Sensory: No sensory deficit.     Motor: No weakness or abnormal muscle tone.     Coordination: Coordination normal.  Psychiatric:        Mood and Affect: Mood normal.        Behavior: Behavior normal.      Imaging: No results found.

## 2020-06-04 NOTE — Progress Notes (Signed)
Pt states lower back pain. pt states he feels sharpe pain and cant get any sleep. Pt state ice and heat pad help ease pain. Pt has hx of inj on 02/07/20 pt state it was fine til the pain returned two weeks ago.  Numeric Pain Rating Scale and Functional Assessment Average Pain 5   In the last MONTH (on 0-10 scale) has pain interfered with the following?  1. General activity like being  able to carry out your everyday physical activities such as walking, climbing stairs, carrying groceries, or moving a chair?  Rating(8)   +Driver, -BT, -Dye Allergies.

## 2020-06-11 ENCOUNTER — Ambulatory Visit: Payer: Medicare Other | Admitting: Orthopaedic Surgery

## 2020-06-16 ENCOUNTER — Encounter: Payer: Self-pay | Admitting: Physical Medicine and Rehabilitation

## 2020-06-17 ENCOUNTER — Telehealth: Payer: Self-pay | Admitting: Physical Medicine and Rehabilitation

## 2020-06-17 ENCOUNTER — Other Ambulatory Visit: Payer: Self-pay | Admitting: Nurse Practitioner

## 2020-06-17 NOTE — Telephone Encounter (Signed)
See mychart messages and scheduling requests.

## 2020-06-17 NOTE — Telephone Encounter (Signed)
Patient's wife [name] called needing to schedule an appointment for patient. The number to contact [name] is [phone]

## 2020-06-20 ENCOUNTER — Other Ambulatory Visit: Payer: Self-pay

## 2020-06-20 ENCOUNTER — Ambulatory Visit
Admission: RE | Admit: 2020-06-20 | Discharge: 2020-06-20 | Disposition: A | Discharge status: Home / Self Care | Payer: Medicare Other | Source: Ambulatory Visit | Attending: Orthopaedic Surgery | Admitting: Orthopaedic Surgery

## 2020-06-20 DIAGNOSIS — M50223 Other cervical disc displacement at C6-C7 level: Secondary | ICD-10-CM | POA: Diagnosis not present

## 2020-06-20 DIAGNOSIS — M4802 Spinal stenosis, cervical region: Secondary | ICD-10-CM | POA: Diagnosis not present

## 2020-06-20 DIAGNOSIS — R221 Localized swelling, mass and lump, neck: Secondary | ICD-10-CM

## 2020-06-20 DIAGNOSIS — M50221 Other cervical disc displacement at C4-C5 level: Secondary | ICD-10-CM | POA: Diagnosis not present

## 2020-06-20 DIAGNOSIS — M50222 Other cervical disc displacement at C5-C6 level: Secondary | ICD-10-CM | POA: Diagnosis not present

## 2020-06-20 MED ORDER — GADOBENATE DIMEGLUMINE 529 MG/ML IV SOLN
20.0000 mL | Freq: Once | INTRAVENOUS | Status: AC | PRN
  Administered 2020-06-20: 20 mL via INTRAVENOUS

## 2020-06-24 ENCOUNTER — Ambulatory Visit (INDEPENDENT_AMBULATORY_CARE_PROVIDER_SITE_OTHER): Payer: Medicare Other | Admitting: Orthopaedic Surgery

## 2020-06-24 ENCOUNTER — Other Ambulatory Visit: Payer: Self-pay

## 2020-06-24 ENCOUNTER — Encounter: Payer: Self-pay | Admitting: Orthopaedic Surgery

## 2020-06-24 DIAGNOSIS — R221 Localized swelling, mass and lump, neck: Secondary | ICD-10-CM

## 2020-06-24 NOTE — Progress Notes (Signed)
The patient comes in today to go over an MRI of the cervical spine.  There is a worrisome mass to the patient in terms of to the lateral aspect and left of the cervical spine that he feels like is growing in size.  He says is uncomfortable to lay on that side at night.  There is no associated radicular symptoms.  He has not been sick.  He denies any chest pain, headache, shortness of breath, fever, chills, nausea, vomiting.  On exam he has full range of motion of the cervical spine but there is a palpable mass deep in the soft tissue just to the left of the cervical spine near the lower aspect of the cervical spine.  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  There is also multiple level arthritic changes in the patient's cervical spine.  At this point he would like a a surgical evaluation for the possibility of removing this.  Since it is certainly a little deeper, I would like to send him his referral to Dr. Kary Kos with neurosurgery for surgical evaluation and treatment of this mass.  The patient agrees with this referral.

## 2020-06-25 ENCOUNTER — Ambulatory Visit: Payer: Medicare Other | Admitting: Orthopaedic Surgery

## 2020-07-01 ENCOUNTER — Encounter: Payer: Self-pay | Admitting: Physical Medicine and Rehabilitation

## 2020-07-01 ENCOUNTER — Ambulatory Visit (INDEPENDENT_AMBULATORY_CARE_PROVIDER_SITE_OTHER): Payer: Medicare Other | Admitting: Physical Medicine and Rehabilitation

## 2020-07-01 ENCOUNTER — Ambulatory Visit: Payer: Self-pay

## 2020-07-01 ENCOUNTER — Other Ambulatory Visit: Payer: Self-pay

## 2020-07-01 VITALS — BP 147/76 | HR 62

## 2020-07-01 DIAGNOSIS — M5416 Radiculopathy, lumbar region: Secondary | ICD-10-CM

## 2020-07-01 DIAGNOSIS — G8929 Other chronic pain: Secondary | ICD-10-CM | POA: Diagnosis not present

## 2020-07-01 DIAGNOSIS — M5442 Lumbago with sciatica, left side: Secondary | ICD-10-CM | POA: Diagnosis not present

## 2020-07-01 DIAGNOSIS — M47816 Spondylosis without myelopathy or radiculopathy, lumbar region: Secondary | ICD-10-CM

## 2020-07-01 DIAGNOSIS — M5441 Lumbago with sciatica, right side: Secondary | ICD-10-CM | POA: Diagnosis not present

## 2020-07-01 MED ORDER — METHYLPREDNISOLONE ACETATE 80 MG/ML IJ SUSP
80.0000 mg | Freq: Once | INTRAMUSCULAR | Status: AC
  Administered 2020-07-01: 80 mg

## 2020-07-01 NOTE — Progress Notes (Signed)
Pt state Lower back pain. Pt state standing for a long time or raking leaves makes the pain worse. Pt state he had been feeling numbness in both thighs. Pt state he use heating and pain meds to heal ease the pain.  Pt has hx of inj on 06/04/20 pt state it works but it didn't get it all.  Numeric Pain Rating Scale and Functional Assessment Average Pain 3   In the last MONTH (on 0-10 scale) has pain interfered with the following?  1. General activity like being  able to carry out your everyday physical activities such as walking, climbing stairs, carrying groceries, or moving a chair?  Rating(10)   +Driver, -BT, -Dye Allergies.

## 2020-07-02 ENCOUNTER — Ambulatory Visit (INDEPENDENT_AMBULATORY_CARE_PROVIDER_SITE_OTHER): Payer: Medicare Other | Admitting: Family Medicine

## 2020-07-02 ENCOUNTER — Telehealth: Payer: Self-pay

## 2020-07-02 DIAGNOSIS — Z23 Encounter for immunization: Secondary | ICD-10-CM | POA: Diagnosis not present

## 2020-07-02 NOTE — Telephone Encounter (Signed)
[name] [name] at [facility] called concerning referral that was sent to their office.  Talked with Sabrina S.concerning referral.

## 2020-07-03 ENCOUNTER — Telehealth: Payer: Self-pay | Admitting: *Deleted

## 2020-07-03 NOTE — Telephone Encounter (Signed)
  Chronic Care Management   Chart Review Note  07/03/2020 Name: [name] MRN: [id number] DOB: 10/07/1945  Chart reviewed and removed patient from CCM program due to non-participation.   Follow up plan: No follow-up needed but patient can be added back to program at any time he or his provider desires.  Chong Sicilian, BSN, RN-BC Embedded Chronic Care Manager [facility] Direct Dial: [phone]

## 2020-07-11 ENCOUNTER — Ambulatory Visit: Payer: Medicare Other | Attending: Internal Medicine

## 2020-07-11 DIAGNOSIS — Z23 Encounter for immunization: Secondary | ICD-10-CM

## 2020-07-11 NOTE — Progress Notes (Signed)
   Covid-19 Vaccination Clinic  Name:  WAYDEN SCHWERTNER    MRN: [id number] DOB: 1945/11/15  07/11/2020  Mr. Hickmon was observed post Covid-19 immunization for 15 minutes without incident. He was provided with Vaccine Information Sheet and instruction to access the V-Safe system.   Mr. Shiraishi was instructed to call 911 with any severe reactions post vaccine: Marland Kitchen Difficulty breathing  . Swelling of face and throat  . A fast heartbeat  . A bad rash all over body  . Dizziness and weakness

## 2020-07-21 ENCOUNTER — Ambulatory Visit
Admission: RE | Admit: 2020-07-21 | Discharge: 2020-07-21 | Disposition: A | Discharge status: Home / Self Care | Payer: Medicare Other | Source: Ambulatory Visit | Attending: Physical Medicine and Rehabilitation | Admitting: Physical Medicine and Rehabilitation

## 2020-07-21 ENCOUNTER — Encounter: Payer: Self-pay | Admitting: Physical Medicine and Rehabilitation

## 2020-07-21 DIAGNOSIS — G8929 Other chronic pain: Secondary | ICD-10-CM

## 2020-07-21 DIAGNOSIS — M47816 Spondylosis without myelopathy or radiculopathy, lumbar region: Secondary | ICD-10-CM

## 2020-07-21 DIAGNOSIS — M48061 Spinal stenosis, lumbar region without neurogenic claudication: Secondary | ICD-10-CM | POA: Diagnosis not present

## 2020-07-21 DIAGNOSIS — M545 Low back pain, unspecified: Secondary | ICD-10-CM | POA: Diagnosis not present

## 2020-07-21 DIAGNOSIS — M5416 Radiculopathy, lumbar region: Secondary | ICD-10-CM

## 2020-07-21 NOTE — Progress Notes (Signed)
[name] [name] - 74 y.o. male MRN [id number]  Date of birth: Aug 26, 1946  Office Visit Note: Visit Date: 07/01/2020 PCP: Chevis Pretty, FNP Referred by: Hassell Done, Mary-Margaret, *  Subjective: Chief Complaint  Patient presents with  . Lower Back - Pain   HPI: [name] [name] is a 74 y.o. male who comes in today For evaluation and management of chronic worsening severe low back pain with some referral symptoms into the thighs with numbness into both thighs which is a fairly new symptom although he has a history of polyneuropathy in his medical history.  We have been seeing him off and on for many years now.  Prior MRI from 2013 with facet arthropathy.  He is followed mainly by Dr. Jean Rosenthal in the office.  He has had very few facet joint blocks at L4-5 over the years and each 1 has helped for a great amount of time up until the last year or so.  He did have a recent injection which can be reviewed.  He states he did diagnostically get good relief he got more than 80% relief but just did not seem to last long.  He is also endorsing this symptoms at counter referral into the upper thighs.  He reports this is worse with standing walking and raking leaves.  He has not noted any focal weakness.  He has had no prior lumbar surgery.  No red flag complaints of fever chills or night sweats or unintended weight loss.  He does have a history of cardiovascular disease and he is on a statin but he continues to take some Aleve at times and this is managed with his primary care provider.  He reports medication and heating pad has helped to a degree.  Interestingly he states the last injection helped but did not get it all and is just somewhat confused with how the medications work and we see that a lot with people having certain ideas of it just being a cortisone shot and the more you put in there the better it gets.  We had a long discussion with him today about the possibility that he may have some  stenosis that has crept in over the years or increasing arthritic change.  He would be a good candidate for radiofrequency ablation if he did not have any stenosis.  Review of Systems  Musculoskeletal: Positive for back pain and joint pain.  Neurological: Positive for tingling.  All other systems reviewed and are negative.  Otherwise per HPI.  Assessment & Plan: Visit Diagnoses:  1. Spondylosis without myelopathy or radiculopathy, lumbar region   2. Chronic bilateral low back pain with bilateral sciatica   3. Lumbar radiculopathy     Plan: Findings:  Chronic history of back pain mostly facet joint mediated pain as evidenced by diagnostic injections over the years that were very beneficial for 6 to 8 months at a time.  More recently these of not helped as much did not last as long.  A long discussion on this today.  I am going to go ahead and repeat the injection in terms of a double block paradigm this would be medial branch blocks.  Were also going to get MRI of the lumbar spine to rule out stenosis or any other reason to have the tingling.  He does have a history of polyneuropathy and I am not sure the source of that.  No electrodiagnostic studies to review.    Meds & Orders:  Meds ordered this  encounter  Medications  . methylPREDNISolone acetate (DEPO-MEDROL) injection 80 mg    Orders Placed This Encounter  Procedures  . Facet Injection  . XR C-ARM NO REPORT  . MR LUMBAR SPINE WO CONTRAST    Follow-up: Return for MRI review after completion.   Procedures: No procedures performed  Lumbar Diagnostic Facet Joint Nerve Block with Fluoroscopic Guidance   Patient: [name] [name]      Date of Birth: 03/22/1946 MRN: [id number] PCP: Chevis Pretty, FNP      Visit Date: 07/01/2020   Universal Protocol:    Date/Time: 11/07/215:36 PM  Consent Given By: the patient  Position: PRONE  Additional Comments: Vital signs were monitored before and after the procedure. Patient  was prepped and draped in the usual sterile fashion. The correct patient, procedure, and site was verified.   Injection Procedure Details:   Procedure diagnoses:  1. Spondylosis without myelopathy or radiculopathy, lumbar region   2. Chronic bilateral low back pain with bilateral sciatica   3. Lumbar radiculopathy      Meds Administered:  Meds ordered this encounter  Medications  . methylPREDNISolone acetate (DEPO-MEDROL) injection 80 mg     Laterality: Bilateral  Location/Site:  L4-L5  Needle size: 22 ga.  Needle type:spinal  Needle Placement: Oblique pedical  Findings:   -Comments: There was excellent flow of contrast along the articular pillars without intravascular flow.  Procedure Details: The fluoroscope beam is vertically oriented in AP and then obliqued 15 to 20 degrees to the ipsilateral side of the desired nerve to achieve the "Scotty dog" appearance.  The skin over the target area of the junction of the superior articulating process and the transverse process (sacral ala if blocking the L5 dorsal rami) was locally anesthetized with a 1 ml volume of 1% Lidocaine without Epinephrine.  The spinal needle was inserted and advanced in a trajectory view down to the target.   After contact with periosteum and negative aspirate for blood and CSF, correct placement without intravascular or epidural spread was confirmed by injecting 0.5 ml. of Isovue-250.  A spot radiograph was obtained of this image.    Next, a 0.5 ml. volume of the injectate described above was injected. The needle was then redirected to the other facet joint nerves mentioned above if needed.  Prior to the procedure, the patient was given a Pain Diary which was completed for baseline measurements.  After the procedure, the patient rated their pain every 30 minutes and will continue rating at this frequency for a total of 5 hours.  The patient has been asked to complete the Diary and return to [place] by mail, fax  or hand delivered as soon as possible.   Additional Comments:  The patient tolerated the procedure well Dressing: 2 x 2 sterile gauze and Band-Aid    Post-procedure details: Patient was observed during the procedure. Post-procedure instructions were reviewed.  Patient left the clinic in stable condition.    Clinical History: Lumbar Spine SOS MRI 12/28/2011  L4-5 facet arthropathy, mild bulging, degenerative changes   He reports that he quit smoking about 24 years ago. His smoking use included cigarettes. He has a 20.00 pack-year smoking history. He has never used smokeless tobacco. No results for input(s): HGBA1C, LABURIC in the last 8760 hours.  Objective:  VS:  HT:    WT:   BMI:     BP:(!) 147/76  HR:62bpm  TEMP: ( )  RESP:  Physical Exam Constitutional:      General:  He is not in acute distress.    Appearance: Normal appearance. He is not ill-appearing.  HENT:     Head: Normocephalic and atraumatic.     Right Ear: External ear normal.     Left Ear: External ear normal.  Eyes:     Extraocular Movements: Extraocular movements intact.  Cardiovascular:     Rate and Rhythm: Normal rate.     Pulses: Normal pulses.  Abdominal:     General: There is no distension.     Palpations: Abdomen is soft.  Musculoskeletal:        General: No tenderness or signs of injury.     Right lower leg: No edema.     Left lower leg: No edema.     Comments: Patient has good distal strength without clonus.Patient somewhat slow to rise from a seated position to full extension.  There is concordant low back pain with facet loading and lumbar spine extension rotation.  There are no definitive trigger points but the patient is somewhat tender across the lower back and PSIS.  There is no pain with hip rotation.   Skin:    Findings: No erythema or rash.  Neurological:     General: No focal deficit present.     Mental Status: He is alert and oriented to person, place, and time.     Sensory: No  sensory deficit.     Motor: No weakness or abnormal muscle tone.     Coordination: Coordination normal.  Psychiatric:        Mood and Affect: Mood normal.        Behavior: Behavior normal.     Ortho Exam  Imaging: No results found.  Past Medical/Family/Surgical/Social History: Medications & Allergies reviewed per EMR, new medications updated. Patient Active Problem List   Diagnosis Date Noted  . Statin intolerance 12/27/2018  . BMI 28.0-28.9,adult 11/01/2018  . H/O arthroscopy of shoulder 02/10/2018  . Unilateral primary osteoarthritis, left knee 11/25/2016  . Unilateral primary osteoarthritis, right knee 11/25/2016  . Essential hypertension 06/28/2015  . H/O prostate cancer 06/28/2015  . Hereditary and idiopathic peripheral neuropathy 04/17/2015  . Carpal tunnel syndrome 04/17/2015  . Hyperlipemia 06/02/2013  . Asthma, chronic 06/02/2013  . Depression 06/02/2013  . GERD 06/04/2009  . LEG PAIN, BILATERAL 06/04/2009  . PERCUTANEOUS TRANSLUMINAL CORONARY ANGIOPLASTY, HX OF 06/04/2009   Past Medical History:  Diagnosis Date  . Asthma, chronic 06/02/2013  . Carpal tunnel syndrome 04/17/2015   Bilateral  . Depression 06/02/2013  . DYSPNEA ON EXERTION 06/04/2009   Qualifier: Diagnosis of  By: Domenic Polite, MD, Phillips Hay   . GERD (gastroesophageal reflux disease)   . Hereditary and idiopathic peripheral neuropathy 04/17/2015  . Hyperlipemia 06/02/2013  . LEG PAIN, BILATERAL 06/04/2009   Qualifier: Diagnosis of  By: Domenic Polite, MD, Phillips Hay   . PERCUTANEOUS TRANSLUMINAL CORONARY ANGIOPLASTY, HX OF 06/04/2009   Qualifier: Diagnosis of  By: Domenic Polite, MD, Phillips Hay   . Prostate CA [facility])    Family History  Problem Relation Age of Onset  . Heart disease Other        Early Cardiac disease for uncle is their 104's as well as deceased.  Marland Kitchen Heart failure Mother   . Dementia Mother 21  . Cancer Father 68       testicular, lung  . Esophageal cancer Brother      Past Surgical History:  Procedure Laterality Date  . CARDIAC CATHETERIZATION  1997  . EYE SURGERY Bilateral  2020   cataracts removed  . HIATAL HERNIA REPAIR    . LAPAROSCOPIC GASTRIC BANDING  2000   [facility]   . MENISCUS REPAIR Left 2012  . ROBOT ASSISTED LAPAROSCOPIC RADICAL PROSTATECTOMY    . ROTATOR CUFF REPAIR Right 2019   Social History   Occupational History  . Occupation: Retired    Comment: Nature conservation officer  Tobacco Use  . Smoking status: Former Smoker    Packs/day: 1.00    Years: 20.00    Pack years: 20.00    Types: Cigarettes    Quit date: 01/19/1996    Years since quitting: 24.5  . Smokeless tobacco: Never Used  Vaping Use  . Vaping Use: Never used  Substance and Sexual Activity  . Alcohol use: Yes    Alcohol/week: 2.0 standard drinks    Types: 2 Cans of beer per week  . Drug use: No  . Sexual activity: Yes

## 2020-07-21 NOTE — Procedures (Signed)
Lumbar Diagnostic Facet Joint Nerve Block with Fluoroscopic Guidance   Patient: [name] [name]      Date of Birth: 1945/10/26 MRN: [id number] PCP: Chevis Pretty, FNP      Visit Date: 07/01/2020   Universal Protocol:    Date/Time: 11/07/215:36 PM  Consent Given By: the patient  Position: PRONE  Additional Comments: Vital signs were monitored before and after the procedure. Patient was prepped and draped in the usual sterile fashion. The correct patient, procedure, and site was verified.   Injection Procedure Details:   Procedure diagnoses:  1. Spondylosis without myelopathy or radiculopathy, lumbar region   2. Chronic bilateral low back pain with bilateral sciatica   3. Lumbar radiculopathy      Meds Administered:  Meds ordered this encounter  Medications  . methylPREDNISolone acetate (DEPO-MEDROL) injection 80 mg     Laterality: Bilateral  Location/Site:  L4-L5  Needle size: 22 ga.  Needle type:spinal  Needle Placement: Oblique pedical  Findings:   -Comments: There was excellent flow of contrast along the articular pillars without intravascular flow.  Procedure Details: The fluoroscope beam is vertically oriented in AP and then obliqued 15 to 20 degrees to the ipsilateral side of the desired nerve to achieve the "Scotty dog" appearance.  The skin over the target area of the junction of the superior articulating process and the transverse process (sacral ala if blocking the L5 dorsal rami) was locally anesthetized with a 1 ml volume of 1% Lidocaine without Epinephrine.  The spinal needle was inserted and advanced in a trajectory view down to the target.   After contact with periosteum and negative aspirate for blood and CSF, correct placement without intravascular or epidural spread was confirmed by injecting 0.5 ml. of Isovue-250.  A spot radiograph was obtained of this image.    Next, a 0.5 ml. volume of the injectate described above was injected. The  needle was then redirected to the other facet joint nerves mentioned above if needed.  Prior to the procedure, the patient was given a Pain Diary which was completed for baseline measurements.  After the procedure, the patient rated their pain every 30 minutes and will continue rating at this frequency for a total of 5 hours.  The patient has been asked to complete the Diary and return to [place] by mail, fax or hand delivered as soon as possible.   Additional Comments:  The patient tolerated the procedure well Dressing: 2 x 2 sterile gauze and Band-Aid    Post-procedure details: Patient was observed during the procedure. Post-procedure instructions were reviewed.  Patient left the clinic in stable condition.

## 2020-07-24 ENCOUNTER — Telehealth: Payer: Self-pay | Admitting: Physical Medicine and Rehabilitation

## 2020-07-24 NOTE — Telephone Encounter (Signed)
Scheduled for MRI review with L4-5 IL. No blood thinners.

## 2020-07-24 NOTE — Telephone Encounter (Signed)
Patient's wife [name] called requesting a call back from [place]. Pt wife returning call to [place]. Please call patient at [phone].

## 2020-08-12 ENCOUNTER — Ambulatory Visit (INDEPENDENT_AMBULATORY_CARE_PROVIDER_SITE_OTHER): Payer: Medicare Other | Admitting: Physical Medicine and Rehabilitation

## 2020-08-12 ENCOUNTER — Encounter: Payer: Self-pay | Admitting: Physical Medicine and Rehabilitation

## 2020-08-12 ENCOUNTER — Ambulatory Visit: Payer: Self-pay

## 2020-08-12 ENCOUNTER — Other Ambulatory Visit: Payer: Self-pay

## 2020-08-12 VITALS — BP 152/76 | HR 63

## 2020-08-12 DIAGNOSIS — M5416 Radiculopathy, lumbar region: Secondary | ICD-10-CM

## 2020-08-12 DIAGNOSIS — M47816 Spondylosis without myelopathy or radiculopathy, lumbar region: Secondary | ICD-10-CM

## 2020-08-12 DIAGNOSIS — M48062 Spinal stenosis, lumbar region with neurogenic claudication: Secondary | ICD-10-CM

## 2020-08-12 MED ORDER — BETAMETHASONE SOD PHOS & ACET 6 (3-3) MG/ML IJ SUSP
12.0000 mg | Freq: Once | INTRAMUSCULAR | Status: AC
  Administered 2020-08-12: 12 mg

## 2020-08-12 NOTE — Progress Notes (Signed)
[name] [name] - 74 y.o. male MRN [id number]  Date of birth: 1946/02/06  Office Visit Note: Visit Date: 08/12/2020 PCP: Chevis Pretty, FNP Referred by: Hassell Done, Mary-Margaret, *  Subjective: Chief Complaint  Patient presents with  . Lower Back - Pain  . Left Leg - Pain  . Right Leg - Pain  . Left Thigh - Numbness  . Right Thigh - Numbness   HPI:  [name] [name] is a 74 y.o. male who comes in today at the request of Dr. Laurence Spates for planned Left L4-L5 Lumbar epidural steroid injection with fluoroscopic guidance.  The patient has failed conservative care including home exercise, medications, time and activity modification.  This injection will be diagnostic and hopefully therapeutic.  Please see requesting physician notes for further details and justification.  Patient's history can be thoroughly reviewed.  We did obtain MRI of the lumbar spine as he had not had one in quite a while.  He has done extremely well over the years with intermittent facet joint block.  These were not as effective recently and my thought was that he probably had some level of stenosis and this has declared itself with pain in the buttock and down both legs with walking.  Does feel a paresthesia at times aching type pain.  He reports last injection which was an epidural injection which was the first epidural he has had did seem to help quite a bit but he still has issues when he is walking.  MRI was shown to him today with spine models and imaging.  It does show pretty significant stenosis at L4-5 with some epidural lipomatosis and multifactorial narrowing.  No high-grade listhesis.  Depending on injection today and how he is doing would look at transforaminal approach.  Patient would be a good candidate for one level decompression.   ROS Otherwise per HPI.  Assessment & Plan: Visit Diagnoses:    ICD-10-CM   1. Spinal stenosis of lumbar region with neurogenic claudication  M48.062 XR C-ARM NO REPORT     Epidural Steroid injection    betamethasone acetate-betamethasone sodium phosphate (CELESTONE) injection 12 mg  2. Spondylosis without myelopathy or radiculopathy, lumbar region  M47.816   3. Lumbar radiculopathy  M54.16     Plan: No additional findings.   Meds & Orders:  Meds ordered this encounter  Medications  . betamethasone acetate-betamethasone sodium phosphate (CELESTONE) injection 12 mg    Orders Placed This Encounter  Procedures  . XR C-ARM NO REPORT  . Epidural Steroid injection    Follow-up: Return if symptoms worsen or fail to improve.   Procedures: No procedures performed  Lumbar Epidural Steroid Injection - Interlaminar Approach with Fluoroscopic Guidance  Patient: [name] [name]      Date of Birth: 07-24-1946 MRN: [id number] PCP: Chevis Pretty, FNP      Visit Date: 08/12/2020   Universal Protocol:     Consent Given By: the patient  Position: PRONE  Additional Comments: Vital signs were monitored before and after the procedure. Patient was prepped and draped in the usual sterile fashion. The correct patient, procedure, and site was verified.   Injection Procedure Details:   Procedure diagnoses: Spinal stenosis of lumbar region with neurogenic claudication [M48.062]   Meds Administered:  Meds ordered this encounter  Medications  . betamethasone acetate-betamethasone sodium phosphate (CELESTONE) injection 12 mg     Laterality: Left  Location/Site:  L4-L5  Needle: 3.5 in., 20 ga. Tuohy  Needle Placement: Paramedian epidural  Findings:   -  Comments: Excellent flow of contrast into the epidural space.  Procedure Details: Using a paramedian approach from the side mentioned above, the region overlying the inferior lamina was localized under fluoroscopic visualization and the soft tissues overlying this structure were infiltrated with 4 ml. of 1% Lidocaine without Epinephrine. The Tuohy needle was inserted into the epidural space using a  paramedian approach.   The epidural space was localized using loss of resistance along with counter oblique bi-planar fluoroscopic views.  After negative aspirate for air, blood, and CSF, a 2 ml. volume of Isovue-250 was injected into the epidural space and the flow of contrast was observed. Radiographs were obtained for documentation purposes.    The injectate was administered into the level noted above.   Additional Comments:  The patient tolerated the procedure well Dressing: 2 x 2 sterile gauze and Band-Aid    Post-procedure details: Patient was observed during the procedure. Post-procedure instructions were reviewed.  Patient left the clinic in stable condition.    Clinical History: MRI LUMBAR SPINE WITHOUT CONTRAST  TECHNIQUE: Multiplanar, multisequence MR imaging of the lumbar spine was performed. No intravenous contrast was administered.  COMPARISON:  MRI lumbar spine 02/28/2007  FINDINGS: Segmentation:  Normal  Alignment: Mild retrolisthesis L1-2 and L2-3. Mild anterolisthesis L4-5.  Vertebrae:  Normal bone marrow.  Negative for fracture or mass.  Conus medullaris and cauda equina: Conus extends to the L1 level. Conus and cauda equina appear normal.  Paraspinal and other soft tissues: Negative for paraspinous mass or adenopathy. Left renal cyst.  Disc levels:  L1-2: Mild disc degeneration and Schmorl's node. Negative for stenosis  L2-3: Mild disc bulging and mild facet degeneration. Negative for disc protrusion or stenosis.  L3-4: Mild disc degeneration and disc bulging. Left lateral osteophyte formation unchanged from prior study. Moderate facet degeneration has progressed in the interval. Mild subarticular stenosis on the left has progressed. Spinal canal adequate in size.  L4-5: 3 mm anterolisthesis. Severe facet degeneration which has progressed significantly in the interval. Moderate spinal stenosis with progression. Neural foramina  patent bilaterally.  L5-S1: Disc degeneration and spurring asymmetric to the right. Moderate facet hypertrophy bilaterally. Moderate subarticular and foraminal stenosis on the right due to spurring. This has progressed significantly in the interval.  IMPRESSION: Moderate spinal stenosis L4-5, with progression since 2008. 3 mm anterolisthesis with severe facet degeneration which has progressed significantly since 2008  Moderate subarticular and foraminal stenosis on the right due to spurring which has progressed significantly since 2008.   Electronically Signed   By: Franchot Gallo M.D.   On: 07/21/2020 20:25     Objective:  VS:  HT:    WT:   BMI:     BP:(!) 152/76  HR:63bpm  TEMP: ( )  RESP:  Physical Exam Constitutional:      General: He is not in acute distress.    Appearance: Normal appearance. He is not ill-appearing.  HENT:     Head: Normocephalic and atraumatic.     Right Ear: External ear normal.     Left Ear: External ear normal.  Eyes:     Extraocular Movements: Extraocular movements intact.  Cardiovascular:     Rate and Rhythm: Normal rate.     Pulses: Normal pulses.  Abdominal:     General: There is no distension.     Palpations: Abdomen is soft.  Musculoskeletal:        General: No tenderness or signs of injury.     Right lower leg: No edema.  Left lower leg: No edema.     Comments: Patient has good distal strength without clonus.  Skin:    Findings: No erythema or rash.  Neurological:     General: No focal deficit present.     Mental Status: He is alert and oriented to person, place, and time.     Sensory: No sensory deficit.     Motor: No weakness or abnormal muscle tone.     Coordination: Coordination normal.  Psychiatric:        Mood and Affect: Mood normal.        Behavior: Behavior normal.      Imaging: XR C-ARM NO REPORT  Result Date: 08/12/2020 Please see Notes tab for imaging impression.

## 2020-08-12 NOTE — Progress Notes (Signed)
Pt state lower back pain that travel down both legs and pt feels numbness in both thighs. Pt state walking makes the pain worse. Pt state he takes pain meds and use heating pads to ease his pain. Pt has hx of inj on 10/18 pt ste the inj went well his just has one spots he feel pain when walking.  Numeric Pain Rating Scale and Functional Assessment Average Pain 0   In the last MONTH (on 0-10 scale) has pain interfered with the following?  1. General activity like being  able to carry out your everyday physical activities such as walking, climbing stairs, carrying groceries, or moving a chair?  Rating(5)   +Driver, -BT, -Dye Allergies.

## 2020-08-12 NOTE — Procedures (Signed)
Lumbar Epidural Steroid Injection - Interlaminar Approach with Fluoroscopic Guidance  Patient: [name] [name]      Date of Birth: [birth year] MRN: [id number] PCP: Chevis Pretty, FNP      Visit Date: 08/12/2020   Universal Protocol:     Consent Given By: the patient  Position: PRONE  Additional Comments: Vital signs were monitored before and after the procedure. Patient was prepped and draped in the usual sterile fashion. The correct patient, procedure, and site was verified.   Injection Procedure Details:   Procedure diagnoses: Spinal stenosis of lumbar region with neurogenic claudication [M48.062]   Meds Administered:  Meds ordered this encounter  Medications  . betamethasone acetate-betamethasone sodium phosphate (CELESTONE) injection 12 mg     Laterality: Left  Location/Site:  L4-L5  Needle: 3.5 in., 20 ga. Tuohy  Needle Placement: Paramedian epidural  Findings:   -Comments: Excellent flow of contrast into the epidural space.  Procedure Details: Using a paramedian approach from the side mentioned above, the region overlying the inferior lamina was localized under fluoroscopic visualization and the soft tissues overlying this structure were infiltrated with 4 ml. of 1% Lidocaine without Epinephrine. The Tuohy needle was inserted into the epidural space using a paramedian approach.   The epidural space was localized using loss of resistance along with counter oblique bi-planar fluoroscopic views.  After negative aspirate for air, blood, and CSF, a 2 ml. volume of Isovue-250 was injected into the epidural space and the flow of contrast was observed. Radiographs were obtained for documentation purposes.    The injectate was administered into the level noted above.   Additional Comments:  The patient tolerated the procedure well Dressing: 2 x 2 sterile gauze and Band-Aid    Post-procedure details: Patient was observed during the procedure. Post-procedure  instructions were reviewed.  Patient left the clinic in stable condition.

## 2020-08-28 DIAGNOSIS — Z8546 Personal history of malignant neoplasm of prostate: Secondary | ICD-10-CM | POA: Diagnosis not present

## 2020-09-04 DIAGNOSIS — R351 Nocturia: Secondary | ICD-10-CM | POA: Diagnosis not present

## 2020-09-04 DIAGNOSIS — N5231 Erectile dysfunction following radical prostatectomy: Secondary | ICD-10-CM | POA: Diagnosis not present

## 2020-09-04 DIAGNOSIS — Z8546 Personal history of malignant neoplasm of prostate: Secondary | ICD-10-CM | POA: Diagnosis not present

## 2020-09-04 DIAGNOSIS — N281 Cyst of kidney, acquired: Secondary | ICD-10-CM | POA: Diagnosis not present

## 2020-09-16 ENCOUNTER — Ambulatory Visit (INDEPENDENT_AMBULATORY_CARE_PROVIDER_SITE_OTHER): Payer: Medicare Other

## 2020-09-16 ENCOUNTER — Ambulatory Visit (INDEPENDENT_AMBULATORY_CARE_PROVIDER_SITE_OTHER): Payer: Medicare Other | Admitting: Physician Assistant

## 2020-09-16 ENCOUNTER — Ambulatory Visit: Payer: Self-pay

## 2020-09-16 ENCOUNTER — Encounter: Payer: Self-pay | Admitting: Physician Assistant

## 2020-09-16 ENCOUNTER — Other Ambulatory Visit: Payer: Self-pay | Admitting: Nurse Practitioner

## 2020-09-16 DIAGNOSIS — M1711 Unilateral primary osteoarthritis, right knee: Secondary | ICD-10-CM

## 2020-09-16 DIAGNOSIS — M1712 Unilateral primary osteoarthritis, left knee: Secondary | ICD-10-CM

## 2020-09-16 MED ORDER — LIDOCAINE HCL 1 % IJ SOLN
5.0000 mL | INTRAMUSCULAR | Status: AC | PRN
  Administered 2020-09-16: 5 mL

## 2020-09-16 MED ORDER — METHYLPREDNISOLONE ACETATE 40 MG/ML IJ SUSP
40.0000 mg | INTRAMUSCULAR | Status: AC | PRN
  Administered 2020-09-16: 40 mg via INTRA_ARTICULAR

## 2020-09-16 MED ORDER — HYDROCODONE-ACETAMINOPHEN 5-325 MG PO TABS
1.0000 | ORAL_TABLET | Freq: Four times a day (QID) | ORAL | 0 refills | Status: DC | PRN
Start: 2020-09-16 — End: 2021-04-25

## 2020-09-16 NOTE — Progress Notes (Signed)
Office Visit Note   Patient: [name] [name]           Date of Birth: [birth year]           MRN: [id number] Visit Date: 09/16/2020              Requested by: Bennie Pierini, FNP [address] [place],  [place] [address] PCP: Bennie Pierini, FNP   Assessment & Plan: Visit Diagnoses:  1. Primary osteoarthritis of left knee   2. Primary osteoarthritis of right knee     Plan: We will see him back in about a week to see how he is doing overall.  Post injection he was able to bend the knee past 90 degrees and states that he felt better overall.  Questions were encouraged and answered.  He is given Norco to take sparingly for knee pain.  Follow-Up Instructions: Return in about 1 week (around 09/23/2020).   Orders:  Orders Placed This Encounter  Procedures  . Large Joint Inj  . XR Knee 1-2 Views Left  . XR Knee 1-2 Views Right   Meds ordered this encounter  Medications  . HYDROcodone-acetaminophen (NORCO/VICODIN) 5-325 MG tablet    Sig: Take 1-2 tablets by mouth every 6 (six) hours as needed for moderate pain.    Dispense:  20 tablet    Refill:  0      Procedures: Large Joint Inj on 09/16/2020 2:13 PM Indications: pain Details: 22 G 1.5 in needle, anterolateral approach  Arthrogram: No  Medications: 40 mg methylPREDNISolone acetate 40 MG/ML; 5 mL lidocaine 1 % Aspirate: 7 mL yellow Outcome: tolerated well, no immediate complications Procedure, treatment alternatives, risks and benefits explained, specific risks discussed. Consent was given by the patient. Immediately prior to procedure a time out was called to verify the correct patient, procedure, equipment, support staff and site/side marked as required. Patient was prepped and draped in the usual sterile fashion.       Clinical Data: No additional findings.   Subjective: Chief Complaint  Patient presents with  . Right Knee - Pain  . Left Knee - Pain    HPI Mr. Anding comes in today for right  knee pain.  He has chronic left knee pain which she states still bothers him quite a bit but the right knee began bothering him this past Monday when he stood up weirdness had pain in the posterior aspect knee since then.  States he has pain whenever he flexes the knee and has decreased range of motion of the knee. Review of Systems Negative for fevers chills recent vaccines.  Objective: Vital Signs: There were no vitals taken for this visit.  Physical Exam Pulmonary:     Effort: Pulmonary effort is normal.  Neurological:     Mental Status: He is alert and oriented to person, place, and time.  Psychiatric:        Mood and Affect: Mood normal.     Ortho Exam Right knee has full extension no instability valgus varus stressing.  Nontender along medial lateral joint line of the right knee.  Tenderness posterior midline aspect after the right knee.  Slight effusion right knee.  Left knee good range of motion Specialty Comments:  No specialty comments available.  Imaging: XR Knee 1-2 Views Left  Result Date: 09/16/2020 Left knee 2 views: Knee is well located.  No acute fracture.  Tricompartmental arthritic changes severe.  XR Knee 1-2 Views Right  Result Date: 09/16/2020 Right knee 2 views  shows Nitka patellofemoral arthritic changes.  Near bone-on-bone medial compartment.  Lateral joint line is overall well-preserved.  Knee is well located.  No acute fractures.  Loose body seen in the popliteal region of the knee midline.    PMFS History: Patient Active Problem List   Diagnosis Date Noted  . Statin intolerance 12/27/2018  . BMI 28.0-28.9,adult 11/01/2018  . H/O arthroscopy of shoulder 02/10/2018  . Unilateral primary osteoarthritis, left knee 11/25/2016  . Unilateral primary osteoarthritis, right knee 11/25/2016  . Essential hypertension 06/28/2015  . H/O prostate cancer 06/28/2015  . Hereditary and idiopathic peripheral neuropathy 04/17/2015  . Carpal tunnel syndrome 04/17/2015   . Hyperlipemia 06/02/2013  . Asthma, chronic 06/02/2013  . Depression 06/02/2013  . GERD 06/04/2009  . LEG PAIN, BILATERAL 06/04/2009  . PERCUTANEOUS TRANSLUMINAL CORONARY ANGIOPLASTY, HX OF 06/04/2009   Past Medical History:  Diagnosis Date  . Asthma, chronic 06/02/2013  . Carpal tunnel syndrome 04/17/2015   Bilateral  . Depression 06/02/2013  . DYSPNEA ON EXERTION 06/04/2009   Qualifier: Diagnosis of  By: Domenic Polite, MD, Phillips Hay   . GERD (gastroesophageal reflux disease)   . Hereditary and idiopathic peripheral neuropathy 04/17/2015  . Hyperlipemia 06/02/2013  . LEG PAIN, BILATERAL 06/04/2009   Qualifier: Diagnosis of  By: Domenic Polite, MD, Phillips Hay   . PERCUTANEOUS TRANSLUMINAL CORONARY ANGIOPLASTY, HX OF 06/04/2009   Qualifier: Diagnosis of  By: Domenic Polite, MD, Phillips Hay   . Prostate CA [facility])     Family History  Problem Relation Age of Onset  . Heart disease Other        Early Cardiac disease for uncle is their 62's as well as deceased.  Marland Kitchen Heart failure Mother   . Dementia Mother 66  . Cancer Father 24       testicular, lung  . Esophageal cancer Brother     Past Surgical History:  Procedure Laterality Date  . CARDIAC CATHETERIZATION  1997  . EYE SURGERY Bilateral 2020   cataracts removed  . HIATAL HERNIA REPAIR    . LAPAROSCOPIC GASTRIC BANDING  2000   [facility]   . MENISCUS REPAIR Left 2012  . ROBOT ASSISTED LAPAROSCOPIC RADICAL PROSTATECTOMY    . ROTATOR CUFF REPAIR Right 2019   Social History   Occupational History  . Occupation: Retired    Comment: Nature conservation officer  Tobacco Use  . Smoking status: Former Smoker    Packs/day: 1.00    Years: 20.00    Pack years: 20.00    Types: Cigarettes    Quit date: 01/19/1996    Years since quitting: 24.6  . Smokeless tobacco: Never Used  Vaping Use  . Vaping Use: Never used  Substance and Sexual Activity  . Alcohol use: Yes    Alcohol/week: 2.0 standard drinks    Types: 2 Cans of beer per week  .  Drug use: No  . Sexual activity: Yes

## 2020-09-20 DIAGNOSIS — B353 Tinea pedis: Secondary | ICD-10-CM | POA: Diagnosis not present

## 2020-09-20 DIAGNOSIS — L239 Allergic contact dermatitis, unspecified cause: Secondary | ICD-10-CM | POA: Diagnosis not present

## 2020-09-25 ENCOUNTER — Ambulatory Visit (INDEPENDENT_AMBULATORY_CARE_PROVIDER_SITE_OTHER): Payer: Medicare Other | Admitting: Physician Assistant

## 2020-09-25 ENCOUNTER — Ambulatory Visit: Payer: Medicare Other | Admitting: Physician Assistant

## 2020-09-25 ENCOUNTER — Encounter: Payer: Self-pay | Admitting: Physician Assistant

## 2020-09-25 DIAGNOSIS — M1712 Unilateral primary osteoarthritis, left knee: Secondary | ICD-10-CM | POA: Diagnosis not present

## 2020-09-25 DIAGNOSIS — R110 Nausea: Secondary | ICD-10-CM | POA: Insufficient documentation

## 2020-09-25 DIAGNOSIS — R1032 Left lower quadrant pain: Secondary | ICD-10-CM | POA: Insufficient documentation

## 2020-09-25 DIAGNOSIS — Z1211 Encounter for screening for malignant neoplasm of colon: Secondary | ICD-10-CM | POA: Insufficient documentation

## 2020-09-25 DIAGNOSIS — K5730 Diverticulosis of large intestine without perforation or abscess without bleeding: Secondary | ICD-10-CM | POA: Insufficient documentation

## 2020-09-25 MED ORDER — METHYLPREDNISOLONE ACETATE 40 MG/ML IJ SUSP
40.0000 mg | INTRAMUSCULAR | Status: AC | PRN
  Administered 2020-09-25: 40 mg via INTRA_ARTICULAR

## 2020-09-25 MED ORDER — LIDOCAINE HCL 1 % IJ SOLN
3.0000 mL | INTRAMUSCULAR | Status: AC | PRN
  Administered 2020-09-25: 3 mL

## 2020-09-25 NOTE — Progress Notes (Signed)
   Procedure Note  Patient: [name] [name]             Date of Birth: December 14, 1945           MRN: [id number]             Visit Date: 09/25/2020 HPI: [name] [name] comes in today for follow-up of his right knee status post injection.  He states the injection helped and he is 80% better.  He is requesting an injection in his left knee today.  He has had no known injury to the left knee.  He has known arthritis left knee.  Physical exam: Left knee good range of motion.  No abnormal warmth erythema or effusion.  Procedures: Visit Diagnoses:  1. Unilateral primary osteoarthritis, left knee     Large Joint Inj: L knee on 09/25/2020 2:54 PM Indications: pain Details: 22 G 1.5 in needle, anterolateral approach  Arthrogram: No  Medications: 3 mL lidocaine 1 %; 40 mg methylPREDNISolone acetate 40 MG/ML Outcome: tolerated well, no immediate complications Procedure, treatment alternatives, risks and benefits explained, specific risks discussed. Consent was given by the patient. Immediately prior to procedure a time out was called to verify the correct patient, procedure, equipment, support staff and site/side marked as required. Patient was prepped and draped in the usual sterile fashion.     Plan: He will follow-up with [place] on as-needed basis.  Questions encouraged and answered

## 2020-10-07 ENCOUNTER — Other Ambulatory Visit: Payer: Self-pay | Admitting: Nurse Practitioner

## 2020-10-07 DIAGNOSIS — J4520 Mild intermittent asthma, uncomplicated: Secondary | ICD-10-CM

## 2020-10-09 ENCOUNTER — Other Ambulatory Visit: Payer: Self-pay | Admitting: Nurse Practitioner

## 2020-10-09 DIAGNOSIS — F3342 Major depressive disorder, recurrent, in full remission: Secondary | ICD-10-CM

## 2020-10-10 ENCOUNTER — Other Ambulatory Visit: Payer: Self-pay

## 2020-10-10 ENCOUNTER — Encounter: Payer: Self-pay | Admitting: Nurse Practitioner

## 2020-10-10 ENCOUNTER — Ambulatory Visit (INDEPENDENT_AMBULATORY_CARE_PROVIDER_SITE_OTHER): Payer: Medicare Other | Admitting: Nurse Practitioner

## 2020-10-10 VITALS — BP 134/74 | HR 57 | Temp 98.0°F | Resp 20 | Ht 74.0 in | Wt 261.0 lb

## 2020-10-10 DIAGNOSIS — F3342 Major depressive disorder, recurrent, in full remission: Secondary | ICD-10-CM | POA: Diagnosis not present

## 2020-10-10 DIAGNOSIS — K219 Gastro-esophageal reflux disease without esophagitis: Secondary | ICD-10-CM

## 2020-10-10 DIAGNOSIS — J4520 Mild intermittent asthma, uncomplicated: Secondary | ICD-10-CM | POA: Diagnosis not present

## 2020-10-10 DIAGNOSIS — I10 Essential (primary) hypertension: Secondary | ICD-10-CM | POA: Diagnosis not present

## 2020-10-10 DIAGNOSIS — Z9861 Coronary angioplasty status: Secondary | ICD-10-CM

## 2020-10-10 DIAGNOSIS — Z6828 Body mass index (BMI) 28.0-28.9, adult: Secondary | ICD-10-CM

## 2020-10-10 DIAGNOSIS — E782 Mixed hyperlipidemia: Secondary | ICD-10-CM

## 2020-10-10 DIAGNOSIS — K5730 Diverticulosis of large intestine without perforation or abscess without bleeding: Secondary | ICD-10-CM | POA: Diagnosis not present

## 2020-10-10 MED ORDER — ATENOLOL 25 MG PO TABS: 25.0000 mg | ORAL_TABLET | Freq: Every day | ORAL | 1 refills | Status: DC

## 2020-10-10 MED ORDER — NAPROXEN 500 MG PO TABS: 500.0000 mg | ORAL_TABLET | Freq: Every day | ORAL | 5 refills | Status: DC

## 2020-10-10 MED ORDER — AMLODIPINE BESYLATE 5 MG PO TABS
5.0000 mg | ORAL_TABLET | Freq: Every day | ORAL | 1 refills | Status: DC
Start: 2020-10-10 — End: 2021-04-18

## 2020-10-10 MED ORDER — PRAVASTATIN SODIUM 20 MG PO TABS: 20.0000 mg | ORAL_TABLET | Freq: Every evening | ORAL | 1 refills | Status: DC

## 2020-10-10 MED ORDER — MONTELUKAST SODIUM 10 MG PO TABS: 10.0000 mg | ORAL_TABLET | Freq: Every day | ORAL | 1 refills | Status: DC

## 2020-10-10 MED ORDER — CITALOPRAM HYDROBROMIDE 20 MG PO TABS
20.0000 mg | ORAL_TABLET | Freq: Every day | ORAL | 1 refills | Status: DC
Start: 2020-10-10 — End: 2021-01-16

## 2020-10-10 MED ORDER — BUDESONIDE-FORMOTEROL FUMARATE 160-4.5 MCG/ACT IN AERO
2.0000 | INHALATION_SPRAY | Freq: Two times a day (BID) | RESPIRATORY_TRACT | 3 refills | Status: DC
Start: 2020-10-10 — End: 2021-05-27

## 2020-10-10 NOTE — Patient Instructions (Signed)

## 2020-10-10 NOTE — Addendum Note (Signed)
Addended by: Othell Diluzio, MARY-MARGARET on: 10/10/2020 12:48 PM   Modules accepted: Orders  

## 2020-10-10 NOTE — Progress Notes (Signed)
Subjective:    Patient ID: [name] [name], male    DOB: 05/30/1946, 75 y.o.   MRN: [id number]   Chief Complaint: Medical Management of Chronic Issues    HPI:  1. Essential hypertension No c/o chest pain, sob or headache. Does not check blood pressure at home. BP Readings from Last 3 Encounters:  10/10/20 134/74  08/12/20 (!) 152/76  07/01/20 (!) 147/76     2. Mixed hyperlipidemia Does not watch diet and does very little exercise. Lab Results  Component Value Date   CHOL 202 (H) 04/09/2020   HDL 46 04/09/2020   LDLCALC 123 (H) 04/09/2020   LDLDIRECT 92.8 08/28/2014   TRIG 187 (H) 04/09/2020   CHOLHDL 4.4 04/09/2020     3. PERCUTANEOUS TRANSLUMINAL CORONARY ANGIOPLASTY, HX OF Has been doing well. Last appointment was august of 020. He has follow up appointment in April  4. Diverticular disease of colon No recent flare ups  5. Gastroesophageal reflux disease, unspecified whether esophagitis present Only uses otc meds as needed  6. Recurrent major depressive disorder, in full remission ([place]) Is on celexa and is doing well no medication side effects Depression screen [facility] 2/9 10/10/2020 10/10/2020 04/09/2020  Decreased Interest 0 0 0  Down, Depressed, Hopeless 0 0 0  PHQ - 2 Score 0 0 0  Altered sleeping 1 - -  Tired, decreased energy 0 - -  Change in appetite 0 - -  Feeling bad or failure about yourself  0 - -  Trouble concentrating 0 - -  Moving slowly or fidgety/restless 0 - -  Suicidal thoughts 0 - -  PHQ-9 Score 1 - -  Difficult doing work/chores Not difficult at all - -     7. BMI 28.0-28.9,adult Weight is up 5lbs Wt Readings from Last 3 Encounters:  10/10/20 261 lb (118.4 kg)  04/09/20 (!) 256 lb (116.1 kg)  10/11/19 251 lb (113.9 kg)       Outpatient Encounter Medications as of 10/10/2020  Medication Sig  . amLODipine (NORVASC) 5 MG tablet Take 1 tablet (5 mg total) by mouth daily.  Marland Kitchen aspirin EC 81 MG tablet Take 81 mg by mouth daily.  Marland Kitchen  atenolol (TENORMIN) 25 MG tablet Take 1 tablet (25 mg total) by mouth daily.  . budesonide-formoterol (SYMBICORT) 160-4.5 MCG/ACT inhaler Inhale 2 puffs into the lungs 2 (two) times daily.  . cholecalciferol (VITAMIN D) 1000 UNITS tablet Take 1,000 Units by mouth daily.  . citalopram (CELEXA) 20 MG tablet TAKE 1 TABLET DAILY  . cyanocobalamin (,VITAMIN B-12,) 1000 MCG/ML injection Inject 1 mL (1,000 mcg total) into the muscle every 30 (thirty) days.  Marland Kitchen HYDROcodone-acetaminophen (NORCO/VICODIN) 5-325 MG tablet Take 1-2 tablets by mouth every 6 (six) hours as needed for moderate pain.  Marland Kitchen loperamide (IMODIUM) 2 MG capsule Take 2 mg by mouth daily.  Marland Kitchen loratadine (CLARITIN) 10 MG tablet Take 10 mg by mouth daily.  . montelukast (SINGULAIR) 10 MG tablet TAKE 1 TABLET DAILY  . naproxen (NAPROSYN) 500 MG tablet Take 500 mg by mouth daily.  . pravastatin (PRAVACHOL) 20 MG tablet Take 1 tablet (20 mg total) by mouth every evening.  . sildenafil (REVATIO) 20 MG tablet Take 1 tablet (20 mg total) by mouth 3 (three) times daily as needed.   No facility-administered encounter medications on file as of 10/10/2020.    Past Surgical History:  Procedure Laterality Date  . CARDIAC CATHETERIZATION  1997  . EYE SURGERY Bilateral 2020   cataracts removed  .  HIATAL HERNIA REPAIR    . LAPAROSCOPIC GASTRIC BANDING  2000   [facility]   . MENISCUS REPAIR Left 2012  . ROBOT ASSISTED LAPAROSCOPIC RADICAL PROSTATECTOMY    . ROTATOR CUFF REPAIR Right 2019    Family History  Problem Relation Age of Onset  . Heart disease Other        Early Cardiac disease for uncle is their 25's as well as deceased.  Marland Kitchen Heart failure Mother   . Dementia Mother 90  . Cancer Father 70       testicular, lung  . Esophageal cancer Brother     New complaints: Has arthritis pain. Has been taking naproxyn of his wife and that really helps.  Social history: Lives with wife  Controlled substance contract: n/a    Review of Systems   Constitutional: Negative for diaphoresis.  Eyes: Negative for pain.  Respiratory: Negative for shortness of breath.   Cardiovascular: Negative for chest pain, palpitations and leg swelling.  Gastrointestinal: Negative for abdominal pain.  Endocrine: Negative for polydipsia.  Skin: Negative for rash.  Neurological: Negative for dizziness, weakness and headaches.  Hematological: Does not bruise/bleed easily.  All other systems reviewed and are negative.      Objective:   Physical Exam Vitals and nursing note reviewed.  Constitutional:      Appearance: Normal appearance. He is well-developed and well-nourished.  HENT:     Head: Normocephalic.     Nose: Nose normal.     Mouth/Throat:     Mouth: Oropharynx is clear and moist.  Eyes:     Extraocular Movements: EOM normal.     Pupils: Pupils are equal, round, and reactive to light.  Neck:     Thyroid: No thyroid mass or thyromegaly.     Vascular: No carotid bruit or JVD.     Trachea: Phonation normal.  Cardiovascular:     Rate and Rhythm: Normal rate and regular rhythm.  Pulmonary:     Effort: Pulmonary effort is normal. No respiratory distress.     Breath sounds: Normal breath sounds.  Abdominal:     General: Bowel sounds are normal. Aorta is normal.     Palpations: Abdomen is soft.     Tenderness: There is no abdominal tenderness.  Musculoskeletal:        General: Normal range of motion.     Cervical back: Normal range of motion and neck supple.  Lymphadenopathy:     Cervical: No cervical adenopathy.  Skin:    General: Skin is warm and dry.  Neurological:     Mental Status: He is alert and oriented to person, place, and time.  Psychiatric:        Mood and Affect: Mood and affect normal.        Behavior: Behavior normal.        Thought Content: Thought content normal.        Judgment: Judgment normal.    BP 134/74   Pulse (!) 57   Temp 98 F (36.7 C) (Temporal)   Resp 20   Ht 6\' 2"  (1.88 m)   Wt 261 lb (118.4  kg)   SpO2 95%   BMI 33.51 kg/m         Assessment & Plan:  [name] [name] comes in today with chief complaint of Medical Management of Chronic Issues   Diagnosis and orders addressed:  1. Essential hypertension Low sodium diet - amLODipine (NORVASC) 5 MG tablet; Take 1 tablet (5 mg total) by  mouth daily.  Dispense: 90 tablet; Refill: 1 - atenolol (TENORMIN) 25 MG tablet; Take 1 tablet (25 mg total) by mouth daily.  Dispense: 90 tablet; Refill: 1 - budesonide-formoterol (SYMBICORT) 160-4.5 MCG/ACT inhaler; Inhale 2 puffs into the lungs 2 (two) times daily.  Dispense: 3 each; Refill: 3  2. Mixed hyperlipidemia Low fat diet - pravastatin (PRAVACHOL) 20 MG tablet; Take 1 tablet (20 mg total) by mouth every evening.  Dispense: 90 tablet; Refill: 1  3. PERCUTANEOUS TRANSLUMINAL CORONARY ANGIOPLASTY, HX OF Keep follow up with cardiology  4. Diverticular disease of colon Watch diet to prevent flare up  5. Gastroesophageal reflux disease, unspecified whether esophagitis present Avoid spicy foods Do not eat 2 hours prior to bedtime  6. Recurrent major depressive disorder, in full remission ([place]) - citalopram (CELEXA) 20 MG tablet; Take 1 tablet (20 mg total) by mouth daily.  Dispense: 90 tablet; Refill: 1  7. BMI 28.0-28.9,adult Discussed diet and exercise for person with BMI >25 Will recheck weight in 3-6 months  8. Mild intermittent chronic asthma without complication - montelukast (SINGULAIR) 10 MG tablet; Take 1 tablet (10 mg total) by mouth daily.  Dispense: 90 tablet; Refill: 1   Labs pending Health Maintenance reviewed Diet and exercise encouraged  Follow up plan: 6 months   Mary-Margaret Hassell Done, FNP

## 2020-10-11 LAB — CBC WITH DIFFERENTIAL/PLATELET
Basophils Absolute: 0.1 10*3/uL (ref 0.0–0.2)
Basos: 1 %
EOS (ABSOLUTE): 0.1 10*3/uL (ref 0.0–0.4)
Eos: 1 %
Hematocrit: 43.7 % (ref 37.5–51.0)
Hemoglobin: 14.9 g/dL (ref 13.0–17.7)
Immature Grans (Abs): 0 10*3/uL (ref 0.0–0.1)
Immature Granulocytes: 0 %
Lymphocytes Absolute: 3.4 10*3/uL — ABNORMAL HIGH (ref 0.7–3.1)
Lymphs: 39 %
MCH: 31.4 pg (ref 26.6–33.0)
MCHC: 34.1 g/dL (ref 31.5–35.7)
MCV: 92 fL (ref 79–97)
Monocytes Absolute: 0.5 10*3/uL (ref 0.1–0.9)
Monocytes: 6 %
Neutrophils Absolute: 4.5 10*3/uL (ref 1.4–7.0)
Neutrophils: 53 %
Platelets: 248 10*3/uL (ref 150–450)
RBC: 4.74 x10E6/uL (ref 4.14–5.80)
RDW: 12.4 % (ref 11.6–15.4)
WBC: 8.5 10*3/uL (ref 3.4–10.8)

## 2020-10-11 LAB — CMP14+EGFR
ALT: 19 IU/L (ref 0–44)
AST: 18 IU/L (ref 0–40)
Albumin/Globulin Ratio: 1.7 (ref 1.2–2.2)
Albumin: 4.4 g/dL (ref 3.7–4.7)
Alkaline Phosphatase: 77 IU/L (ref 44–121)
BUN/Creatinine Ratio: 15 (ref 10–24)
BUN: 20 mg/dL (ref 8–27)
Bilirubin Total: 0.4 mg/dL (ref 0.0–1.2)
CO2: 26 mmol/L (ref 20–29)
Calcium: 9.7 mg/dL (ref 8.6–10.2)
Chloride: 100 mmol/L (ref 96–106)
Creatinine, Ser: 1.36 mg/dL — ABNORMAL HIGH (ref 0.76–1.27)
GFR calc Af Amer: 59 mL/min/1.73 — ABNORMAL LOW
GFR calc non Af Amer: 51 mL/min/1.73 — ABNORMAL LOW
Globulin, Total: 2.6 g/dL (ref 1.5–4.5)
Glucose: 110 mg/dL — ABNORMAL HIGH (ref 65–99)
Potassium: 4.8 mmol/L (ref 3.5–5.2)
Sodium: 140 mmol/L (ref 134–144)
Total Protein: 7 g/dL (ref 6.0–8.5)

## 2020-10-11 LAB — LIPID PANEL
Chol/HDL Ratio: 4.1 ratio (ref 0.0–5.0)
Cholesterol, Total: 182 mg/dL (ref 100–199)
HDL: 44 mg/dL (ref >39)
LDL Chol Calc (NIH): 105 mg/dL — ABNORMAL HIGH (ref 0–99)
Triglycerides: 193 mg/dL — ABNORMAL HIGH (ref 0–149)
VLDL Cholesterol Cal: 33 mg/dL (ref 5–40)

## 2020-10-16 ENCOUNTER — Other Ambulatory Visit: Payer: Self-pay | Admitting: Surgery

## 2020-10-16 DIAGNOSIS — R221 Localized swelling, mass and lump, neck: Secondary | ICD-10-CM | POA: Diagnosis not present

## 2020-10-24 ENCOUNTER — Ambulatory Visit (INDEPENDENT_AMBULATORY_CARE_PROVIDER_SITE_OTHER): Payer: Medicare Other

## 2020-10-24 DIAGNOSIS — Z0000 Encounter for general adult medical examination without abnormal findings: Secondary | ICD-10-CM | POA: Diagnosis not present

## 2020-10-24 NOTE — Progress Notes (Signed)
MEDICARE ANNUAL WELLNESS VISIT  10/24/2020  Telephone Visit Disclaimer This Medicare AWV was conducted by telephone due to national recommendations for restrictions regarding the COVID-19 Pandemic (e.g. social distancing).  I verified, using two identifiers, that I am speaking with [name] [name] or their authorized healthcare agent. I discussed the limitations, risks, security, and privacy concerns of performing an evaluation and management service by telephone and the potential availability of an in-person appointment in the future. The patient expressed understanding and agreed to proceed.  Location of Patient: Home Location of Provider (nurse):  [facility]  Subjective:    [name] [name] is a 75 y.o. male patient of Chevis Pretty, [place] who had a Medicare Annual Wellness Visit today via telephone. Veronica is Retired and lives with their spouse. He has one son. He reports that he is socially active and does interact with friends/family regularly. He is minimally physically active and enjoys working in the yard and spending time with family.  Patient Care Team: Chevis Pretty, FNP as PCP - General (Family Medicine) Magnus Sinning, MD as Consulting Physician Magnus Sinning, MD as Consulting Physician Mcarthur Rossetti, MD as Consulting Physician (Orthopedic Surgery) Harlen Labs, MD as Referring Physician (Optometry) Jessy Oto, MD as Consulting Physician (Orthopedic Surgery) Geri Seminole, MD as Referring Physician (Dermatopathology)  Advanced Directives 10/24/2020 10/23/2019 10/20/2018 02/25/2018 02/17/2018 10/08/2014  Does Patient Have a Medical Advance Directive? Yes No No No No No  Type of Advance Directive Living will;Healthcare Power of Attorney - - - - -  Does patient want to make changes to medical advance directive? No - Patient declined - - - - -  Copy of [place] in Chart? No - copy requested - - - - -  Would patient like  information on creating a medical advance directive? - No - Patient declined Yes (MAU/Ambulatory/Procedural Areas - Information given) No - Patient declined - No - patient declined information    Hospital Utilization Over the Past 12 Months: # of hospitalizations or ER visits: 0 # of surgeries: 0  Review of Systems    Patient reports that his overall health is unchanged compared to last year.  History obtained from chart review and the patient  Patient Reported Readings (BP, Pulse, CBG, Weight, etc) none  Pain Assessment Pain : 0-10 Pain Score: 4  Pain Location: Knee Pain Orientation: Left,Right Pain Descriptors / Indicators: Aching,Discomfort,Throbbing Pain Onset: More than a month ago Pain Frequency: Several days a week Pain Relieving Factors: cortisone shots, gel  Pain Relieving Factors: cortisone shots, gel  Current Medications & Allergies (verified) Allergies as of 10/24/2020      Reactions   Fenofibrate Other (See Comments)   Pt reports causes joint pains   Rosuvastatin Other (See Comments)   Pt reports causes joint pains      Medication List       Accurate as of October 24, 2020  1:30 PM. If you have any questions, ask your nurse or doctor.        amLODipine 5 MG tablet Commonly known as: NORVASC Take 1 tablet (5 mg total) by mouth daily.   aspirin EC 81 MG tablet Take 81 mg by mouth daily.   atenolol 25 MG tablet Commonly known as: TENORMIN Take 1 tablet (25 mg total) by mouth daily.   budesonide-formoterol 160-4.5 MCG/ACT inhaler Commonly known as: SYMBICORT Inhale 2 puffs into the lungs 2 (two) times daily.   cholecalciferol 1000 units tablet Commonly  known as: VITAMIN D Take 1,000 Units by mouth daily.   citalopram 20 MG tablet Commonly known as: CELEXA Take 1 tablet (20 mg total) by mouth daily.   cyanocobalamin 1000 MCG/ML injection Commonly known as: (VITAMIN B-12) Inject 1 mL (1,000 mcg total) into the muscle every 30 (thirty) days.    HYDROcodone-acetaminophen 5-325 MG tablet Commonly known as: NORCO/VICODIN Take 1-2 tablets by mouth every 6 (six) hours as needed for moderate pain.   loperamide 2 MG capsule Commonly known as: IMODIUM Take 2 mg by mouth daily.   loratadine 10 MG tablet Commonly known as: CLARITIN Take 10 mg by mouth daily.   montelukast 10 MG tablet Commonly known as: SINGULAIR Take 1 tablet (10 mg total) by mouth daily.   naproxen 500 MG tablet Commonly known as: NAPROSYN Take 1 tablet (500 mg total) by mouth daily.   pravastatin 20 MG tablet Commonly known as: PRAVACHOL Take 1 tablet (20 mg total) by mouth every evening.   sildenafil 20 MG tablet Commonly known as: REVATIO Take 1 tablet (20 mg total) by mouth 3 (three) times daily as needed.       History (reviewed): Past Medical History:  Diagnosis Date  . Asthma, chronic 06/02/2013  . Carpal tunnel syndrome 04/17/2015   Bilateral  . Depression 06/02/2013  . DYSPNEA ON EXERTION 06/04/2009   Qualifier: Diagnosis of  By: Domenic Polite, MD, [place]   . GERD (gastroesophageal reflux disease)   . Hereditary and idiopathic peripheral neuropathy 04/17/2015  . Hyperlipemia 06/02/2013  . LEG PAIN, BILATERAL 06/04/2009   Qualifier: Diagnosis of  By: Domenic Polite, MD, Phillips Hay   . PERCUTANEOUS TRANSLUMINAL CORONARY ANGIOPLASTY, HX OF 06/04/2009   Qualifier: Diagnosis of  By: Domenic Polite, MD, Phillips Hay   . Prostate CA [facility])    Past Surgical History:  Procedure Laterality Date  . CARDIAC CATHETERIZATION  1997  . EYE SURGERY Bilateral 2020   cataracts removed  . HIATAL HERNIA REPAIR    . LAPAROSCOPIC GASTRIC BANDING  2000   [facility]   . MENISCUS REPAIR Left 2012  . ROBOT ASSISTED LAPAROSCOPIC RADICAL PROSTATECTOMY    . ROTATOR CUFF REPAIR Right 2019   Family History  Problem Relation Age of Onset  . Heart disease Other        Early Cardiac disease for uncle is their 81's as well as deceased.  Marland Kitchen Heart failure  Mother   . Dementia Mother 20  . Cancer Father 30       testicular, lung  . Esophageal cancer Brother    Social History   Socioeconomic History  . Marital status: Married    Spouse name: [name]  . Number of children: 3  . Years of education: 74  . Highest education level: Master's degree (e.g., MA, MS, MEng, MEd, MSW, MBA)  Occupational History  . Occupation: Retired    Comment: Nature conservation officer  Tobacco Use  . Smoking status: Former Smoker    Packs/day: 1.00    Years: 20.00    Pack years: 20.00    Types: Cigarettes    Quit date: 01/19/1996    Years since quitting: 24.7  . Smokeless tobacco: Never Used  Vaping Use  . Vaping Use: Never used  Substance and Sexual Activity  . Alcohol use: Yes    Alcohol/week: 2.0 standard drinks    Types: 2 Cans of beer per week  . Drug use: No  . Sexual activity: Yes  Other Topics Concern  . Not on file  Social History Narrative   Patient is married and lives at home with is wife. He has three adult children and five grandchildren. 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    Social Determinants of Health   Financial Resource Strain: Not on file  Food Insecurity: Not on file  Transportation Needs: Not on file  Physical Activity: Not on file  Stress: Not on file  Social Connections: Not on file    Activities of Daily Living In your present state of health, do you have any difficulty performing the following activities: 10/24/2020  Hearing? N  Vision? N  Difficulty concentrating or making decisions? N  Walking or climbing stairs? Y  Comment knee pain  Dressing or bathing? N  Doing errands, shopping? N  Preparing Food and eating ? N  Using the Toilet? N  In the past six months, have you accidently leaked urine? N  Do you have problems with loss of bowel control? N  Managing your Medications? N  Managing your Finances? N  Housekeeping or managing your Housekeeping? N  Some recent data might be hidden   Patient has pain  in both knees  Patient Education/ Literacy How often do you need to have someone help you when you read instructions, pamphlets, or other written materials from your doctor or pharmacy?: 1 - Never What is the last grade level you completed in school?: Master's Degree  Exercise Current Exercise Habits: The patient does not participate in regular exercise at present, Exercise limited by: orthopedic condition(s)  Diet Patient reports consuming 3 meals a day and 1 snack(s) a day Patient reports that his primary diet is: Regular Patient reports that he does have regular access to food.   Depression Screen PHQ 2/9 Scores 10/10/2020 10/10/2020 04/09/2020 10/23/2019 10/11/2019 04/10/2019 11/01/2018  PHQ - 2 Score 0 0 0 0 0 0 0  PHQ- 9 Score 1 - - - - - -     Fall Risk Fall Risk  10/24/2020 10/10/2020 04/09/2020 10/23/2019 10/11/2019  Falls in the past year? 0 0 0 0 0  Follow up Falls evaluation completed - - - -     Objective:  [place] seemed alert and oriented and he participated appropriately during our telephone visit.  Blood Pressure Weight BMI  BP Readings from Last 3 Encounters:  10/10/20 134/74  08/12/20 (!) 152/76  07/01/20 (!) 147/76   Wt Readings from Last 3 Encounters:  10/10/20 261 lb (118.4 kg)  04/09/20 (!) 256 lb (116.1 kg)  10/11/19 251 lb (113.9 kg)   BMI Readings from Last 1 Encounters:  10/10/20 33.51 kg/m    *Unable to obtain current vital signs, weight, and BMI due to telephone visit type  Hearing/Vision  . Vale did not seem to have difficulty with hearing/understanding during the telephone conversation . Reports that he has had a formal eye exam by an eye care professional within the past year . Reports that he has not had a formal hearing evaluation within the past year *Unable to fully assess hearing and vision during telephone visit type  Cognitive Function: 6CIT Screen 10/24/2020 10/23/2019  What Year? 0 points 0 points  What month? 0 points 0 points   What time? 0 points 0 points  Count back from 20 0 points 0 points  Months in reverse 0 points 0 points  Repeat phrase 0 points 0 points  Total Score 0 0   (Normal:0-7, Significant for Dysfunction: >8)  Normal Cognitive Function  Screening: Yes   Immunization & Health Maintenance Record Immunization History  Administered Date(s) Administered  . Fluad Quad(high Dose 65+) 06/21/2019, 07/02/2020  . Influenza, High Dose Seasonal PF 07/07/2016, 06/15/2017, 07/14/2018  . Influenza,inj,Quad PF,6+ Mos 06/27/2013, 06/25/2014, 06/28/2015  . Moderna SARS-COV2 Booster Vaccination 07/11/2020  . Moderna Sars-Covid-2 Vaccination 10/20/2019, 11/18/2019  . Pneumococcal Conjugate-13 06/28/2015  . Pneumococcal Polysaccharide-23 10/27/2011  . Tdap 07/07/2016  . Zoster Recombinat (Shingrix) 12/09/2017, 02/16/2018    Health Maintenance  Topic Date Due  . Samul Dada  07/07/2026  . COLONOSCOPY (Pts 45-50yrs Insurance coverage will need to be confirmed)  12/06/2029  . INFLUENZA VACCINE  Completed  . COVID-19 Vaccine  Completed  . Hepatitis C Screening  Completed  . PNA vac Low Risk Adult  Completed       Assessment  This is a routine wellness examination for Mavric L Labrie.  Health Maintenance: Due or Overdue There are no preventive care reminders to display for this patient.  [name] [name] does not need a referral for Community Assistance: Care Management:   no Social Work:    no Prescription Assistance:  no Nutrition/Diabetes Education:  no   Plan:  Personalized Goals Goals Addressed            This Visit's Progress   . Patient Stated       10/24/2020 AWV Goal: Exercise for General Health   Patient will verbalize understanding of the benefits of increased physical activity:  Exercising regularly is important. It will improve your overall fitness, flexibility, and endurance.  Regular exercise also will improve your overall health. It can help you control your weight,  reduce stress, and improve your bone density.  Over the next year, patient will increase physical activity as tolerated with a goal of at least 150 minutes of moderate physical activity per week.   You can tell that you are exercising at a moderate intensity if your heart starts beating faster and you start breathing faster but can still hold a conversation.  Moderate-intensity exercise ideas include:  Walking 1 mile (1.6 km) in about 15 minutes  Biking  Hiking  Golfing  Dancing  Water aerobics  Patient will verbalize understanding of everyday activities that increase physical activity by providing examples like the following: ? Yard work, such as: ? Pushing a Conservation officer, nature ? Raking and bagging leaves ? Washing your car ? Pushing a stroller ? Shoveling snow ? Gardening ? Washing windows or floors  Patient will be able to explain general safety guidelines for exercising:   Before you start a new exercise program, talk with your health care provider.  Do not exercise so much that you hurt yourself, feel dizzy, or get very short of breath.  Wear comfortable clothes and wear shoes with good support.  Drink plenty of water while you exercise to prevent dehydration or heat stroke.  Work out until your breathing and your heartbeat get faster.       Personalized Health Maintenance & Screening Recommendations  Up to date  Lung Cancer Screening Recommended: no (Low Dose CT Chest recommended if Age 105-80 years, 30 pack-year currently smoking OR have quit w/in past 15 years) Hepatitis C Screening recommended: no HIV Screening recommended: no  Advanced Directives: Written information was not prepared per patient's request.  Referrals & Orders No orders of the defined types were placed in this encounter.   Follow-up Plan . Follow-up with Chevis Pretty, FNP as planned     I have personally reviewed and  noted the following in the patient's chart:   . Medical and  social history . Use of alcohol, tobacco or illicit drugs  . Current medications and supplements . Functional ability and status . Nutritional status . Physical activity . Advanced directives . List of other physicians . Hospitalizations, surgeries, and ER visits in previous 12 months . Vitals . Screenings to include cognitive, depression, and falls . Referrals and appointments  In addition, I have reviewed and discussed with [name] [name] certain preventive protocols, quality metrics, and best practice recommendations. A written personalized care plan for preventive services as well as general preventive health recommendations is available and can be mailed to the patient at his request.      Felicity Coyer, LPN     [id number]   Patient declined after visit summary

## 2020-11-05 ENCOUNTER — Other Ambulatory Visit: Payer: Self-pay

## 2020-11-05 ENCOUNTER — Encounter (HOSPITAL_BASED_OUTPATIENT_CLINIC_OR_DEPARTMENT_OTHER): Payer: Self-pay | Admitting: Surgery

## 2020-11-08 ENCOUNTER — Other Ambulatory Visit (HOSPITAL_COMMUNITY)
Admission: RE | Admit: 2020-11-08 | Discharge: 2020-11-08 | Disposition: A | Discharge status: Home / Self Care | Payer: Medicare Other | Source: Ambulatory Visit | Attending: Surgery | Admitting: Surgery

## 2020-11-08 ENCOUNTER — Encounter (HOSPITAL_BASED_OUTPATIENT_CLINIC_OR_DEPARTMENT_OTHER)
Admission: RE | Admit: 2020-11-08 | Discharge: 2020-11-08 | Disposition: A | Discharge status: Home / Self Care | Payer: Medicare Other | Source: Ambulatory Visit | Attending: Surgery | Admitting: Surgery

## 2020-11-08 DIAGNOSIS — Z20822 Contact with and (suspected) exposure to covid-19: Secondary | ICD-10-CM | POA: Insufficient documentation

## 2020-11-08 DIAGNOSIS — Z01812 Encounter for preprocedural laboratory examination: Secondary | ICD-10-CM | POA: Insufficient documentation

## 2020-11-08 LAB — SARS CORONAVIRUS 2 (TAT 6-24 HRS): SARS Coronavirus 2: NEGATIVE

## 2020-11-08 MED ORDER — ENSURE PRE-SURGERY PO LIQD: 296.0000 mL | Freq: Once | ORAL | Status: DC

## 2020-11-08 NOTE — Progress Notes (Signed)

## 2020-11-09 ENCOUNTER — Other Ambulatory Visit (HOSPITAL_COMMUNITY): Payer: Medicare Other

## 2020-11-11 ENCOUNTER — Ambulatory Visit (INDEPENDENT_AMBULATORY_CARE_PROVIDER_SITE_OTHER): Payer: Medicare Other | Admitting: Physician Assistant

## 2020-11-11 ENCOUNTER — Encounter: Payer: Self-pay | Admitting: Physician Assistant

## 2020-11-11 VITALS — Ht 74.0 in | Wt 261.0 lb

## 2020-11-11 DIAGNOSIS — M1712 Unilateral primary osteoarthritis, left knee: Secondary | ICD-10-CM | POA: Diagnosis not present

## 2020-11-11 DIAGNOSIS — M1711 Unilateral primary osteoarthritis, right knee: Secondary | ICD-10-CM | POA: Diagnosis not present

## 2020-11-11 NOTE — Progress Notes (Signed)
Office Visit Note   Patient: [name] [name]           Date of Birth: [birth year]           MRN: [id number] Visit Date: 11/11/2020              Requested by: Chevis Pretty, [address] [place],  [place] [id number] PCP: Chevis Pretty, FNP   Assessment & Plan: Visit Diagnoses:  1. Primary osteoarthritis of left knee   2. Primary osteoarthritis of right knee     Plan: Due to the fact the patient is failed conservative treatment and the fact that [name] [name] is now having pain in his life that is affecting his activities of daily living recommend knee replacement.  [name] [name] would like to begin with the right knee in the near future.  Risk benefits of surgery discussed with patient.  Risk include but are not limited to nerve vessel injury, blood loss, DVT/PE, wound healing problems, prolonged pain worsening pain.  Postoperative protocol discussed with patient at length.  We will see him back 2 weeks postop.  Follow-Up Instructions: Return 2 WEEKS POST OP.   Orders:  No orders of the defined types were placed in this encounter.  No orders of the defined types were placed in this encounter.     Procedures: No procedures performed   Clinical Data: No additional findings.   Subjective: Chief Complaint  Patient presents with  . Left Knee - Follow-up  . Right Knee - Follow-up    HPI [name] [name] is well-known to Dr. Ninfa Linden service comes in today with bilateral knee pain.  [name] [name] was given injections on 09/16/2020 with cortisone in both knees and states that these really did not help.  [name] [name] is asking what else can be done.  [name] [name] has known end-stage tricompartmental arthritis.  [name] [name] has had to stop taking NSAIDs due to the fact that it raised his BUN/creatinine.  [name] [name] has had no new injury.  Recent EKG done for an upcoming lipoma excision.  [name] [name] denies any chest pain shortness of breath. Review of Systems Negative for fevers or chills.  Objective: Vital Signs: Ht 6\' 2"  (1.88 m)   Wt  261 lb (118.4 kg)   BMI 33.51 kg/m   Physical Exam Pulmonary:     Effort: Pulmonary effort is normal.  Neurological:     Mental Status: [name] [name] is alert and oriented to person, place, and time.  Psychiatric:        Mood and Affect: Mood normal.     Ortho Exam Bilateral knees crepitus with passive range of motion both knees.  No abnormal warmth erythema of either knee.  No instability valgus varus stressing of either knee.  Good range of motion of both knees. Specialty Comments:  No specialty comments available.  Imaging: No results found.   PMFS History: Patient Active Problem List   Diagnosis Date Noted  . Left lower quadrant pain 09/25/2020  . Diverticular disease of colon 09/25/2020  . Colon cancer screening 09/25/2020  . Statin intolerance 12/27/2018  . BMI 28.0-28.9,adult 11/01/2018  . H/O arthroscopy of shoulder 02/10/2018  . Primary osteoarthritis of left knee 11/25/2016  . Primary osteoarthritis of right knee 11/25/2016  . Essential hypertension 06/28/2015  . H/O prostate cancer 06/28/2015  . Hereditary and idiopathic peripheral neuropathy 04/17/2015  . Carpal tunnel syndrome 04/17/2015  . Hyperlipemia 06/02/2013  . Asthma, chronic 06/02/2013  . Depression 06/02/2013  . GERD 06/04/2009  . LEG PAIN, BILATERAL 06/04/2009  .  PERCUTANEOUS TRANSLUMINAL CORONARY ANGIOPLASTY, HX OF 06/04/2009   Past Medical History:  Diagnosis Date  . Asthma, chronic 06/02/2013  . Carpal tunnel syndrome 04/17/2015   Bilateral  . Depression 06/02/2013  . DYSPNEA ON EXERTION 06/04/2009   Qualifier: Diagnosis of  By: Domenic Polite, MD, Phillips Hay   . GERD (gastroesophageal reflux disease)   . Hereditary and idiopathic peripheral neuropathy 04/17/2015  . Hyperlipemia 06/02/2013  . Hypertension   . LEG PAIN, BILATERAL 06/04/2009   Qualifier: Diagnosis of  By: Domenic Polite, MD, Phillips Hay   . PERCUTANEOUS TRANSLUMINAL CORONARY ANGIOPLASTY, HX OF 06/04/2009   Qualifier: Diagnosis  of  By: Domenic Polite, MD, Phillips Hay   . Prostate CA [facility])     Family History  Problem Relation Age of Onset  . Heart disease Other        Early Cardiac disease for uncle is their 55's as well as deceased.  Marland Kitchen Heart failure Mother   . Dementia Mother 73  . Cancer Father 16       testicular, lung  . Esophageal cancer Brother     Past Surgical History:  Procedure Laterality Date  . CARDIAC CATHETERIZATION  1997  . EYE SURGERY Bilateral 2020   cataracts removed  . HERNIA REPAIR    . HIATAL HERNIA REPAIR    . LAPAROSCOPIC GASTRIC BANDING  2000   [facility]   . MENISCUS REPAIR Left 2012  . ROBOT ASSISTED LAPAROSCOPIC RADICAL PROSTATECTOMY    . ROTATOR CUFF REPAIR Right 2019   Social History   Occupational History  . Occupation: Retired    Comment: Nature conservation officer  Tobacco Use  . Smoking status: Former Smoker    Packs/day: 1.00    Years: 20.00    Pack years: 20.00    Types: Cigarettes    Quit date: 01/19/1996    Years since quitting: 24.8  . Smokeless tobacco: Never Used  Vaping Use  . Vaping Use: Never used  Substance and Sexual Activity  . Alcohol use: Yes    Alcohol/week: 2.0 standard drinks    Types: 2 Cans of beer per week    Comment: social  . Drug use: No  . Sexual activity: Yes

## 2020-11-11 NOTE — H&P (Signed)
[name] [name] Appointment: 10/16/2020 9:20 AM Location: [facility] Patient #: [id number] DOB: Jan 14, 1946 Married / Language: Cleophus Molt / Race: White Male   History of Present Illness Nathaneil Canary A. Ninfa Linden MD; 10/16/2020 9:12 AM) The patient is a 75 year old male who presents with a complaint of Mass.  Chief complaint: Left posterior neck mass  This is a 75 year old gentleman referred here for evaluation of a left posterior neck mass. He has noticed for several months now. It causes discomfort especially when he tries to sleep at night and lie on his neck. He reports it is slowly getting bigger. He had an MRI showing the mass to be approximately 1.4 cm in size in the subcutaneous tissue. There were no other abnormalities. He is otherwise without complaints.   Past Surgical History Malachi Bonds, CMA; 10/16/2020 9:05 AM) Cataract Surgery  Bilateral. Colon Polyp Removal - Colonoscopy  Nissen Fundoplication  Prostate Surgery - Removal  Shoulder Surgery  Right.  Diagnostic Studies History Malachi Bonds, CMA; 10/16/2020 9:05 AM) Colonoscopy  within last year  Allergies Malachi Bonds, CMA; 10/16/2020 9:05 AM) No Known Drug Allergies  [10/16/2020]:  Medication History Malachi Bonds, CMA; 10/16/2020 9:06 AM) Pravastatin Sodium (20MG  Tablet, Oral) Active. Cyanocobalamin (1000MCG/ML Solution, Injection) Active. Symbicort (160-4.5MCG/ACT Aerosol, Inhalation) Active. Montelukast Sodium (10MG  Tablet, Oral) Active. Atenolol (25MG  Tablet, Oral) Active. amLODIPine Besylate (5MG  Tablet, Oral) Active. Citalopram Hydrobromide (20MG  Tablet, Oral) Active. Aspirin (81MG  Tablet, Oral) Active. Vitamin D3 (25 MCG(1000 UT) Capsule, Oral) Active. Medications Reconciled  Social History Malachi Bonds, CMA; 10/16/2020 9:05 AM) Alcohol use  Occasional alcohol use. Caffeine use  Carbonated beverages, Coffee, Tea. No drug use  Tobacco use  Former smoker.  Family History  Malachi Bonds, CMA; 10/16/2020 9:05 AM) Arthritis  Mother. Cancer  Brother, Father.  Other Problems Malachi Bonds, CMA; 10/16/2020 9:05 AM) Arthritis  Asthma  Back Pain  Diverticulosis  Gastroesophageal Reflux Disease  High blood pressure  Hypercholesterolemia  Inguinal Hernia  Prostate Cancer     Review of Systems (Chemira Jones CMA; 10/16/2020 9:05 AM) General Not Present- Appetite Loss, Chills, Fatigue, Fever, Night Sweats, Weight Gain and Weight Loss. Skin Not Present- Change in Wart/Mole, Dryness, Hives, Jaundice, New Lesions, Non-Healing Wounds, Rash and Ulcer. HEENT Present- Seasonal Allergies. Not Present- Earache, Hearing Loss, Hoarseness, Nose Bleed, Oral Ulcers, Ringing in the Ears, Sinus Pain, Sore Throat, Visual Disturbances, Wears glasses/contact lenses and Yellow Eyes. Respiratory Present- Snoring. Not Present- Bloody sputum, Chronic Cough, Difficulty Breathing and Wheezing. Breast Not Present- Breast Mass, Breast Pain, Nipple Discharge and Skin Changes. Cardiovascular Present- Leg Cramps. Not Present- Chest Pain, Difficulty Breathing Lying Down, Palpitations, Rapid Heart Rate, Shortness of Breath and Swelling of Extremities. Gastrointestinal Present- Chronic diarrhea and Excessive gas. Not Present- Abdominal Pain, Bloating, Bloody Stool, Change in Bowel Habits, Constipation, Difficulty Swallowing, Gets full quickly at meals, Hemorrhoids, Indigestion, Nausea, Rectal Pain and Vomiting. Musculoskeletal Present- Back Pain and Joint Stiffness. Not Present- Joint Pain, Muscle Pain, Muscle Weakness and Swelling of Extremities. Neurological Present- Numbness and Trouble walking. Not Present- Decreased Memory, Fainting, Headaches, Seizures, Tingling, Tremor and Weakness. Psychiatric Not Present- Anxiety, Bipolar, Change in Sleep Pattern, Depression, Fearful and Frequent crying. Endocrine Not Present- Cold Intolerance, Excessive Hunger, Hair Changes, Heat Intolerance, Hot  flashes and New Diabetes. Hematology Present- Easy Bruising. Not Present- Blood Thinners, Excessive bleeding, Gland problems, HIV and Persistent Infections.  Vitals (Chemira Jones CMA; 10/16/2020 9:05 AM) 10/16/2020 9:05 AM Weight: 267 lb Height: 74.5in Body Surface Area: 2.47 m Body  Mass Index: 33.82 kg/m  Pulse: 101 (Regular)        Physical Exam (Eartha Vonbehren A. Ninfa Linden MD; 10/16/2020 9:12 AM) The physical exam findings are as follows: Note: He appears well exam  There is a soft, slightly mobile mass in the deep subcutaneous tissue of the left posterior neck near the shoulder. There are no skin changes. It is nonpulsatile    Assessment & Plan (Liviya Santini A. Ninfa Linden MD; 10/16/2020 9:13 AM) NECK MASS (R22.1) Impression: I reviewed his notes in the electronic medical records. I reviewed his MRI. This is a subcutaneous left neck mass of uncertain etiology. It may be a lipoma. It is, however, getting larger and symptomatic therefore excision is recommended for symptoms and for complete histologic evaluation trauma malignancy or sarcoma. I discussed this with him in detail. I discussed the surgical procedure which includes but is not limited to bleeding, infection, injury to surrounding structures, recurrence of the mass, cardiopulmonary issues, postoperative recovery, etc. He understands and wishes to proceed with surgery which will be scheduled.

## 2020-11-11 NOTE — Anesthesia Preprocedure Evaluation (Addendum)
Anesthesia Evaluation  Patient identified by MRN, date of birth, ID band Patient awake    Reviewed: Allergy & Precautions, NPO status , Patient's Chart, lab work & pertinent test results  Airway Mallampati: II  TM Distance: >3 FB Neck ROM: Full    Dental no notable dental hx. (+) Teeth Intact, Dental Advisory Given   Pulmonary asthma , former smoker,    Pulmonary exam normal breath sounds clear to auscultation       Cardiovascular hypertension, Pt. on medications and Pt. on home beta blockers Normal cardiovascular exam Rhythm:Regular Rate:Normal     Neuro/Psych PSYCHIATRIC DISORDERS Depression  Neuromuscular disease    GI/Hepatic Neg liver ROS, (+) neg Cirrhosis      ,   Endo/Other  negative endocrine ROS  Renal/GU negative Renal ROS     Musculoskeletal  (+) Arthritis ,   Abdominal   Peds  Hematology negative hematology ROS (+)   Anesthesia Other Findings   Reproductive/Obstetrics                            Anesthesia Physical Anesthesia Plan  ASA: III  Anesthesia Plan: MAC   Post-op Pain Management:    Induction: Intravenous  PONV Risk Score and Plan: 2 and Treatment may vary due to age or medical condition and Ondansetron  Airway Management Planned: Natural Airway and Nasal Cannula  Additional Equipment: None  Intra-op Plan:   Post-operative Plan:   Informed Consent: I have reviewed the patients History and Physical, chart, labs and discussed the procedure including the risks, benefits and alternatives for the proposed anesthesia with the patient or authorized representative who has indicated his/her understanding and acceptance.     Dental advisory given  Plan Discussed with: CRNA and Anesthesiologist  Anesthesia Plan Comments:         Anesthesia Quick Evaluation

## 2020-11-12 ENCOUNTER — Ambulatory Visit (HOSPITAL_BASED_OUTPATIENT_CLINIC_OR_DEPARTMENT_OTHER)
Admission: RE | Admit: 2020-11-12 | Discharge: 2020-11-12 | Disposition: A | Discharge status: Home / Self Care | Payer: Medicare Other | Attending: Surgery | Admitting: Surgery

## 2020-11-12 ENCOUNTER — Other Ambulatory Visit: Payer: Self-pay

## 2020-11-12 ENCOUNTER — Ambulatory Visit (HOSPITAL_BASED_OUTPATIENT_CLINIC_OR_DEPARTMENT_OTHER): Payer: Medicare Other | Admitting: Anesthesiology

## 2020-11-12 ENCOUNTER — Encounter (HOSPITAL_BASED_OUTPATIENT_CLINIC_OR_DEPARTMENT_OTHER)
Admission: RE | Disposition: A | Discharge status: Home / Self Care | Payer: Self-pay | Source: Home / Self Care | Attending: Surgery

## 2020-11-12 ENCOUNTER — Encounter (HOSPITAL_BASED_OUTPATIENT_CLINIC_OR_DEPARTMENT_OTHER): Payer: Self-pay | Admitting: Surgery

## 2020-11-12 DIAGNOSIS — Z8719 Personal history of other diseases of the digestive system: Secondary | ICD-10-CM | POA: Insufficient documentation

## 2020-11-12 DIAGNOSIS — Z9079 Acquired absence of other genital organ(s): Secondary | ICD-10-CM | POA: Insufficient documentation

## 2020-11-12 DIAGNOSIS — Z809 Family history of malignant neoplasm, unspecified: Secondary | ICD-10-CM | POA: Diagnosis not present

## 2020-11-12 DIAGNOSIS — D170 Benign lipomatous neoplasm of skin and subcutaneous tissue of head, face and neck: Secondary | ICD-10-CM | POA: Diagnosis not present

## 2020-11-12 DIAGNOSIS — Z8546 Personal history of malignant neoplasm of prostate: Secondary | ICD-10-CM | POA: Insufficient documentation

## 2020-11-12 DIAGNOSIS — K219 Gastro-esophageal reflux disease without esophagitis: Secondary | ICD-10-CM | POA: Diagnosis not present

## 2020-11-12 DIAGNOSIS — E7800 Pure hypercholesterolemia, unspecified: Secondary | ICD-10-CM | POA: Diagnosis not present

## 2020-11-12 DIAGNOSIS — E785 Hyperlipidemia, unspecified: Secondary | ICD-10-CM | POA: Diagnosis not present

## 2020-11-12 DIAGNOSIS — Z87891 Personal history of nicotine dependence: Secondary | ICD-10-CM | POA: Diagnosis not present

## 2020-11-12 DIAGNOSIS — R221 Localized swelling, mass and lump, neck: Secondary | ICD-10-CM | POA: Diagnosis not present

## 2020-11-12 DIAGNOSIS — I10 Essential (primary) hypertension: Secondary | ICD-10-CM | POA: Diagnosis not present

## 2020-11-12 HISTORY — PX: EXCISION MASS NECK: SHX6703

## 2020-11-12 HISTORY — DX: Essential (primary) hypertension: I10

## 2020-11-12 SURGERY — EXCISION, MASS, NECK
Anesthesia: Monitor Anesthesia Care | Site: Neck | Laterality: Left

## 2020-11-12 MED ORDER — ONDANSETRON HCL 4 MG/2ML IJ SOLN: 4.0000 mg | Freq: Once | INTRAMUSCULAR | Status: DC | PRN

## 2020-11-12 MED ORDER — ACETAMINOPHEN 500 MG PO TABS
1000.0000 mg | ORAL_TABLET | ORAL | Status: AC
  Administered 2020-11-12: 1000 mg via ORAL

## 2020-11-12 MED ORDER — ONDANSETRON HCL 4 MG/2ML IJ SOLN
INTRAMUSCULAR | Status: AC
  Filled 2020-11-12: qty 2

## 2020-11-12 MED ORDER — CHLORHEXIDINE GLUCONATE CLOTH 2 % EX PADS: 6.0000 | MEDICATED_PAD | Freq: Once | CUTANEOUS | Status: DC

## 2020-11-12 MED ORDER — PROPOFOL 500 MG/50ML IV EMUL
INTRAVENOUS | Status: DC | PRN
  Administered 2020-11-12: 200 ug/kg/min via INTRAVENOUS

## 2020-11-12 MED ORDER — ACETAMINOPHEN 10 MG/ML IV SOLN: 1000.0000 mg | Freq: Once | INTRAVENOUS | Status: DC | PRN

## 2020-11-12 MED ORDER — MIDAZOLAM HCL 2 MG/2ML IJ SOLN
INTRAMUSCULAR | Status: AC
  Filled 2020-11-12: qty 2

## 2020-11-12 MED ORDER — LACTATED RINGERS IV SOLN: INTRAVENOUS | Status: DC

## 2020-11-12 MED ORDER — ACETAMINOPHEN 500 MG PO TABS
ORAL_TABLET | ORAL | Status: AC
  Filled 2020-11-12: qty 2

## 2020-11-12 MED ORDER — LACTATED RINGERS IV SOLN: INTRAVENOUS | Status: DC | PRN

## 2020-11-12 MED ORDER — ONDANSETRON HCL 4 MG/2ML IJ SOLN
INTRAMUSCULAR | Status: DC | PRN
  Administered 2020-11-12: 4 mg via INTRAVENOUS

## 2020-11-12 MED ORDER — FENTANYL CITRATE (PF) 100 MCG/2ML IJ SOLN
INTRAMUSCULAR | Status: AC
  Filled 2020-11-12: qty 2

## 2020-11-12 MED ORDER — FENTANYL CITRATE (PF) 100 MCG/2ML IJ SOLN: 25.0000 ug | INTRAMUSCULAR | Status: DC | PRN

## 2020-11-12 MED ORDER — MIDAZOLAM HCL 5 MG/5ML IJ SOLN
INTRAMUSCULAR | Status: DC | PRN
  Administered 2020-11-12: 2 mg via INTRAVENOUS

## 2020-11-12 MED ORDER — CEFAZOLIN SODIUM-DEXTROSE 2-4 GM/100ML-% IV SOLN
INTRAVENOUS | Status: AC
  Filled 2020-11-12: qty 100

## 2020-11-12 MED ORDER — TRAMADOL HCL 50 MG PO TABS: 50.0000 mg | ORAL_TABLET | Freq: Four times a day (QID) | ORAL | 0 refills | Status: DC | PRN

## 2020-11-12 MED ORDER — PROPOFOL 500 MG/50ML IV EMUL
INTRAVENOUS | Status: AC
  Filled 2020-11-12: qty 50

## 2020-11-12 MED ORDER — CEFAZOLIN SODIUM-DEXTROSE 2-4 GM/100ML-% IV SOLN: 2.0000 g | INTRAVENOUS | Status: DC

## 2020-11-12 MED ORDER — LIDOCAINE HCL (PF) 1 % IJ SOLN
INTRAMUSCULAR | Status: DC | PRN
  Administered 2020-11-12: 10 mL

## 2020-11-12 MED ORDER — BUPIVACAINE-EPINEPHRINE (PF) 0.25% -1:200000 IJ SOLN
INTRAMUSCULAR | Status: AC
  Filled 2020-11-12: qty 60

## 2020-11-12 MED ORDER — FENTANYL CITRATE (PF) 100 MCG/2ML IJ SOLN
INTRAMUSCULAR | Status: DC | PRN
  Administered 2020-11-12: 50 ug via INTRAVENOUS

## 2020-11-12 SURGICAL SUPPLY — 43 items
ADH SKN CLS APL DERMABOND .7 (GAUZE/BANDAGES/DRESSINGS) ×1
APL PRP STRL LF DISP 70% ISPRP (MISCELLANEOUS) ×1
BLADE CLIPPER SURG (BLADE) IMPLANT
BLADE SURG 15 STRL LF DISP TIS (BLADE) ×1 IMPLANT
BLADE SURG 15 STRL SS (BLADE) ×2
BNDG ELASTIC 3X5.8 VLCR STR LF (GAUZE/BANDAGES/DRESSINGS) IMPLANT
BNDG ELASTIC 4X5.8 VLCR STR LF (GAUZE/BANDAGES/DRESSINGS) IMPLANT
CANISTER SUCT 1200ML W/VALVE (MISCELLANEOUS) IMPLANT
CHLORAPREP W/TINT 26 (MISCELLANEOUS) ×2 IMPLANT
COVER BACK TABLE 60X90IN (DRAPES) ×2 IMPLANT
COVER MAYO STAND STRL (DRAPES) ×2 IMPLANT
COVER WAND RF STERILE (DRAPES) IMPLANT
DECANTER SPIKE VIAL GLASS SM (MISCELLANEOUS) IMPLANT
DERMABOND ADVANCED (GAUZE/BANDAGES/DRESSINGS) ×1
DERMABOND ADVANCED .7 DNX12 (GAUZE/BANDAGES/DRESSINGS) ×1 IMPLANT
DRAPE LAPAROTOMY 100X72 PEDS (DRAPES) ×2 IMPLANT
DRAPE UTILITY XL STRL (DRAPES) ×2 IMPLANT
ELECT REM PT RETURN 9FT ADLT (ELECTROSURGICAL) ×2
ELECTRODE REM PT RTRN 9FT ADLT (ELECTROSURGICAL) ×1 IMPLANT
GLOVE BIOGEL M 6.5 STRL (GLOVE) ×2 IMPLANT
GLOVE SURG SIGNA 7.5 PF LTX (GLOVE) ×2 IMPLANT
GLOVE SURG UNDER POLY LF SZ7 (GLOVE) ×1 IMPLANT
GOWN STRL REUS W/ TWL LRG LVL3 (GOWN DISPOSABLE) ×1 IMPLANT
GOWN STRL REUS W/ TWL XL LVL3 (GOWN DISPOSABLE) ×1 IMPLANT
GOWN STRL REUS W/TWL LRG LVL3 (GOWN DISPOSABLE) ×4
GOWN STRL REUS W/TWL XL LVL3 (GOWN DISPOSABLE) ×2
NDL HYPO 25X1 1.5 SAFETY (NEEDLE) ×1 IMPLANT
NEEDLE HYPO 25X1 1.5 SAFETY (NEEDLE) ×2 IMPLANT
NS IRRIG 1000ML POUR BTL (IV SOLUTION) IMPLANT
PACK BASIN DAY SURGERY FS (CUSTOM PROCEDURE TRAY) ×2 IMPLANT
PENCIL SMOKE EVACUATOR (MISCELLANEOUS) ×2 IMPLANT
SLEEVE SCD COMPRESS KNEE MED (STOCKING) IMPLANT
SPONGE LAP 4X18 RFD (DISPOSABLE) ×2 IMPLANT
SUT MNCRL AB 4-0 PS2 18 (SUTURE) ×1 IMPLANT
SUT VIC AB 2-0 SH 27 (SUTURE)
SUT VIC AB 2-0 SH 27XBRD (SUTURE) IMPLANT
SUT VIC AB 3-0 SH 27 (SUTURE) ×2
SUT VIC AB 3-0 SH 27X BRD (SUTURE) IMPLANT
SYR BULB EAR ULCER 3OZ GRN STR (SYRINGE) IMPLANT
SYR CONTROL 10ML LL (SYRINGE) ×2 IMPLANT
TOWEL GREEN STERILE FF (TOWEL DISPOSABLE) ×2 IMPLANT
TUBE CONNECTING 20X1/4 (TUBING) IMPLANT
YANKAUER SUCT BULB TIP NO VENT (SUCTIONS) IMPLANT

## 2020-11-12 NOTE — Interval H&P Note (Signed)
History and Physical Interval Note: no change in H and P  11/12/2020 7:07 AM  [name] [name]  has presented today for surgery, with the diagnosis of LEFT POSTERIOR NECK MASS.  The various methods of treatment have been discussed with the patient and family. After consideration of risks, benefits and other options for treatment, the patient has consented to  Procedure(s): EXCISION LEFT POSTERIOR NECK MASS (Left) as a surgical intervention.  The patient's history has been reviewed, patient examined, no change in status, stable for surgery.  I have reviewed the patient's chart and labs.  Questions were answered to the patient's satisfaction.     Coralie Keens

## 2020-11-12 NOTE — Discharge Instructions (Signed)
Ok to shower starting tomorrow  No vigorous activity for 1 week  Ice pack, Tylenol, and ibuprofen also for pain  May take Tylenol after 12:40pm, if needed.    Post Anesthesia Home Care Instructions  Activity: Get plenty of rest for the remainder of the day. A responsible individual must stay with you for 24 hours following the procedure.  For the next 24 hours, DO NOT: -Drive a car -Paediatric nurse -Drink alcoholic beverages -Take any medication unless instructed by your physician -Make any legal decisions or sign important papers.  Meals: Start with liquid foods such as gelatin or soup. Progress to regular foods as tolerated. Avoid greasy, spicy, heavy foods. If nausea and/or vomiting occur, drink only clear liquids until the nausea and/or vomiting subsides. Call your physician if vomiting continues.  Special Instructions/Symptoms: Your throat may feel dry or sore from the anesthesia or the breathing tube placed in your throat during surgery. If this causes discomfort, gargle with warm salt water. The discomfort should disappear within 24 hours.  If you had a scopolamine patch placed behind your ear for the management of post- operative nausea and/or vomiting:  1. The medication in the patch is effective for 72 hours, after which it should be removed.  Wrap patch in a tissue and discard in the trash. Wash hands thoroughly with soap and water. 2. You may remove the patch earlier than 72 hours if you experience unpleasant side effects which may include dry mouth, dizziness or visual disturbances. 3. Avoid touching the patch. Wash your hands with soap and water after contact with the patch.

## 2020-11-12 NOTE — Anesthesia Postprocedure Evaluation (Signed)
Anesthesia Post Note  Patient: [name] [name]  Procedure(s) Performed: EXCISION LEFT POSTERIOR NECK MASS (Left Neck)     Patient location during evaluation: PACU Anesthesia Type: MAC Level of consciousness: awake and alert Pain management: pain level controlled Vital Signs Assessment: post-procedure vital signs reviewed and stable Respiratory status: spontaneous breathing, nonlabored ventilation, respiratory function stable and patient connected to nasal cannula oxygen Cardiovascular status: stable and blood pressure returned to baseline Postop Assessment: no apparent nausea or vomiting Anesthetic complications: no   No complications documented.  Last Vitals:  Vitals:   11/12/20 0807 11/12/20 0815  BP: (!) 119/58 132/66  Pulse: (!) 57 61  Resp: 14 17  Temp:  36.8 C  SpO2: 97% 95%    Last Pain:  Vitals:   11/12/20 0815  TempSrc:   PainSc: 0-No pain                 Barnet Glasgow

## 2020-11-12 NOTE — Transfer of Care (Signed)
Immediate Anesthesia Transfer of Care Note  Patient: [name] [name]  Procedure(s) Performed: EXCISION LEFT POSTERIOR NECK MASS (Left Neck)  Patient Location: PACU  Anesthesia Type:MAC  Level of Consciousness: awake, alert  and oriented  Airway & Oxygen Therapy: Patient Spontanous Breathing and Patient connected to face mask oxygen  Post-op Assessment: Report given to RN and Post -op Vital signs reviewed and stable  Post vital signs: Reviewed and stable  Last Vitals:  Vitals Value Taken Time  BP    Temp    Pulse 55 11/12/20 0754  Resp 11 11/12/20 0754  SpO2 99 % 11/12/20 0754  Vitals shown include unvalidated device data.  Last Pain:  Vitals:   11/12/20 0647  TempSrc: Oral  PainSc: 4       Patients Stated Pain Goal: 6 (49/75/30 0511)  Complications: No complications documented.

## 2020-11-12 NOTE — Op Note (Signed)
EXCISION LEFT POSTERIOR NECK MASS  Procedure Note  MICKEL SCHREUR 11/12/2020   Pre-op Diagnosis: LEFT POSTERIOR NECK MASS     Post-op Diagnosis: same   Procedure(s): EXCISION LEFT POSTERIOR NECK MASS (2 cm)  Surgeon(s): Coralie Keens, MD  Anesthesia: Monitor Anesthesia Care  Staff:  Circulator: Ted Mcalpine, RN Scrub Person: Lorenza Burton, [place] Circulator Assistant: Izora Ribas, RN  Estimated Blood Loss: Minimal               Specimens: sent to path  Findings: 2 cm posterior neck mass consistent with a lipoma.  Procedure: The patient was brought to the operating room and identifies correct patient.  He was placed supine on the operating table and then turned into the right lateral decubitus position.  His posterior neck and upper back were prepped and draped in the usual sterile fashion.  I anesthetized the skin over the palpable mass which is at the base of the neck and just to the left of the midline with lidocaine.  I made a longitudinal incision with a scalpel.  I then dissected down to the deep subcutaneous tissue with the cautery.  Identified the mass which appeared consistent with a firm lipoma.  I excised it in its entirety with the cautery.  It measured approximately 2 cm in size.  The mass was then sent to pathology for evaluation.  I then achieved hemostasis with cautery.  I anesthetized the wound further with lidocaine.  I then closed the subcutaneous tissue with interrupted 2-0 Vicryl sutures and closed skin with running 4-0 Monocryl.  Dermabond was then applied.  The patient tolerated the procedure well.  All the counts were correct at the end of the procedure.  The patient was then taken in stable condition from the operating room to the recovery room.          Coralie Keens   Date: 11/12/2020  Time: 7:50 AM

## 2020-11-12 NOTE — Anesthesia Procedure Notes (Signed)
Performed by: Verita Lamb, CRNA

## 2020-11-13 ENCOUNTER — Encounter (HOSPITAL_BASED_OUTPATIENT_CLINIC_OR_DEPARTMENT_OTHER): Payer: Self-pay | Admitting: Surgery

## 2020-11-13 LAB — SURGICAL PATHOLOGY

## 2020-11-17 NOTE — Progress Notes (Signed)
Cardiology Office Note:    Date:  11/19/2020   ID:  [name], DOB April 10, 1946, MRN [id number]  PCP:  Chevis Pretty, [place]  Cardiologist:  No primary care provider on file.  Advanced Practice Provider:  No care team member to display Electrophysiologist:  None   Referring MD: [name] [name], Mary-Margaret, *    History of Present Illness:    [name] [name] is a 75 y.o. male with a hx of COPD, hyperlipidemia, prior history of prostate cancer now in remission, basal cell carcinoma s/p excision and palpitations who was previously followed by Dr. Meda [name] who now returns to clinic for follow-up.  Last saw Dr. Meda [name] in 04/2019 where he was having SOB with exertion, but was still able to walk 77min-1 hour per day. No changes in medications made at that time.  Today, patient presents for follow-up. Planned for right TKA with orthopedics due to severe arthritis. States he used to be very active but is now very limited due to arthritis in both knees. Denies any chest pain, orthopnea, palpitations, PND. Has chronic shortness of breath that has been ongoing for the past couple of years. Has worsened slightly, but he has been less active recently due to joint pain and he thinks deconditioning may be contributing. Blood pressure is well controlled at home.   Family history: Uncles and aunts with MI and CAD.   Past Medical History:  Diagnosis Date  . Asthma, chronic 06/02/2013  . Carpal tunnel syndrome 04/17/2015   Bilateral  . Depression 06/02/2013  . DYSPNEA ON EXERTION 06/04/2009   Qualifier: Diagnosis of  By: [name] Polite, MD, [place] Hay   . GERD (gastroesophageal reflux disease)   . Hereditary and idiopathic peripheral neuropathy 04/17/2015  . Hyperlipemia 06/02/2013  . Hypertension   . LEG PAIN, BILATERAL 06/04/2009   Qualifier: Diagnosis of  By: [name] Polite, MD, Phillips Hay   . PERCUTANEOUS TRANSLUMINAL CORONARY ANGIOPLASTY, HX OF 06/04/2009    Qualifier: Diagnosis of  By: [name] Polite, MD, Phillips Hay   . Prostate CA [facility])     Past Surgical History:  Procedure Laterality Date  . CARDIAC CATHETERIZATION  1997  . EXCISION MASS NECK Left 11/12/2020   Procedure: EXCISION LEFT POSTERIOR NECK MASS;  Surgeon: [name] Keens, MD;  Location: [facility];  Service: General;  Laterality: Left;  . EYE SURGERY Bilateral 2020   cataracts removed  . HERNIA REPAIR    . HIATAL HERNIA REPAIR    . LAPAROSCOPIC GASTRIC BANDING  2000   [facility]   . MENISCUS REPAIR Left 2012  . ROBOT ASSISTED LAPAROSCOPIC RADICAL PROSTATECTOMY    . ROTATOR CUFF REPAIR Right 2019    Current Medications: Current Meds  Medication Sig  . amLODipine (NORVASC) 5 MG tablet Take 1 tablet (5 mg total) by mouth daily.  Marland Kitchen aspirin EC 81 MG tablet Take 81 mg by mouth daily.  Marland Kitchen atenolol (TENORMIN) 25 MG tablet Take 1 tablet (25 mg total) by mouth daily.  . budesonide-formoterol (SYMBICORT) 160-4.5 MCG/ACT inhaler Inhale 2 puffs into the lungs 2 (two) times daily.  . Cholecalciferol (VITAMIN D3) 1.25 MG (50000 UT) CAPS   . citalopram (CELEXA) 20 MG tablet Take 1 tablet (20 mg total) by mouth daily.  . clotrimazole-betamethasone (LOTRISONE) cream Apply topically 2 (two) times daily.  . cyanocobalamin (,VITAMIN B-12,) 1000 MCG/ML injection Inject 1 mL (1,000 mcg total) into the muscle every 30 (thirty) days.  Marland Kitchen ezetimibe (ZETIA) 10 MG  tablet Take 1 tablet (10 mg total) by mouth daily.  Marland Kitchen HYDROcodone-acetaminophen (NORCO/VICODIN) 5-325 MG tablet Take 1-2 tablets by mouth every 6 (six) hours as needed for moderate pain.  . Loperamide HCl 1 MG/7.5ML LIQD   . loratadine (CLARITIN) 10 MG tablet Take 10 mg by mouth daily.  . montelukast (SINGULAIR) 10 MG tablet Take 1 tablet (10 mg total) by mouth daily.  . naproxen (NAPROSYN) 500 MG tablet Take 500 mg by mouth daily.  . pravastatin (PRAVACHOL) 10 MG tablet   . sildenafil (REVATIO) 20 MG tablet Take 1 tablet  (20 mg total) by mouth 3 (three) times daily as needed.  . traMADol (ULTRAM) 50 MG tablet Take 1 tablet (50 mg total) by mouth every 6 (six) hours as needed for moderate pain.     Allergies:   Fenofibrate and Rosuvastatin   Social History   Socioeconomic History  . Marital status: Married    Spouse name: [name]  . Number of children: 3  . Years of education: 15  . Highest education level: Master's degree (e.g., MA, MS, MEng, MEd, MSW, MBA)  Occupational History  . Occupation: Retired    Comment: Nature conservation officer  Tobacco Use  . Smoking status: Former Smoker    Packs/day: 1.00    Years: 20.00    Pack years: 20.00    Types: Cigarettes    Quit date: 01/19/1996    Years since quitting: 24.8  . Smokeless tobacco: Never Used  Vaping Use  . Vaping Use: Never used  Substance and Sexual Activity  . Alcohol use: Yes    Alcohol/week: 2.0 standard drinks    Types: 2 Cans of beer per week    Comment: social  . Drug use: No  . Sexual activity: Yes  Other Topics Concern  . Not on file  Social History Narrative   Patient is married and lives at home with is wife. He has three adult children and five grandchildren. 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    Social Determinants of Health   Financial Resource Strain: Not on file  Food Insecurity: Not on file  Transportation Needs: Not on file  Physical Activity: Not on file  Stress: Not on file  Social Connections: Not on file     Family History: The patient's family history includes Cancer (age of onset: 6) in his father; Dementia (age of onset: 74) in his mother; Esophageal cancer in his brother; Heart disease in an other family member; Heart failure in his mother.  ROS:   Please see the history of present illness.    Review of Systems  Constitutional: Negative for chills and fever.  HENT: Negative for hearing loss and sore throat.   Eyes: Negative for blurred vision and redness.  Respiratory: Positive for shortness  of breath.   Cardiovascular: Negative for chest pain, palpitations, orthopnea, claudication, leg swelling and PND.  Gastrointestinal: Negative for melena, nausea and vomiting.  Genitourinary: Negative for dysuria and flank pain.  Musculoskeletal: Positive for joint pain and myalgias.  Neurological: Negative for dizziness and loss of consciousness.  Endo/Heme/Allergies: Negative for polydipsia.  Psychiatric/Behavioral: Negative for substance abuse.    EKGs/Labs/Other Studies Reviewed:    The following studies were reviewed today: TTE 2012/12/11: Study Conclusions   - Left ventricle: The cavity size was normal. Wall thickness  was increased in a pattern of moderate LVH. Systolic  function was normal. The estimated ejection fraction was  in the range of 50% to 55%.  Wall motion was normal; there  were no regional wall motion abnormalities. Doppler  parameters are consistent with abnormal left ventricular  relaxation (grade 1 diastolic dysfunction).  - Left atrium: The atrium was mildly dilated.    Myoview 2014: Negative for ischemia, normal LVEF  EKG:  EKG is  ordered today.  The ekg ordered today demonstrates NSR with LAFB, IVCD.  Recent Labs: 10/10/2020: ALT 19; BUN 20; Creatinine, Ser 1.36; Hemoglobin 14.9; Platelets 248; Potassium 4.8; Sodium 140  Recent Lipid Panel    Component Value Date/Time   CHOL 182 10/10/2020 1255   TRIG 193 (H) 10/10/2020 1255   TRIG 244 (H) 10/24/2014 1201   HDL 44 10/10/2020 1255   HDL 43 10/24/2014 1201   CHOLHDL 4.1 10/10/2020 1255   CHOLHDL 7.0 (H) 08/31/2016 0800   VLDL 65 (H) 08/31/2016 0800   LDLCALC 105 (H) 10/10/2020 1255   LDLCALC 81 01/11/2014 0840   LDLDIRECT 92.8 08/28/2014 1041      Physical Exam:    VS:  BP 120/62   Pulse 62   Ht 6' 2.5" (1.892 m)   Wt 263 lb (119.3 kg)   SpO2 96%   BMI 33.32 kg/m     Wt Readings from Last 3 Encounters:  11/19/20 263 lb (119.3 kg)  11/12/20 256 lb 9.9 oz (116.4 kg)  11/11/20  261 lb (118.4 kg)     GEN:  Well nourished, well developed in no acute distress HEENT: Normal NECK: No JVD; No carotid bruits CARDIAC: RRR, no murmurs, rubs, gallops RESPIRATORY:  Clear to auscultation without rales, wheezing or rhonchi  ABDOMEN: Soft, non-tender, non-distended MUSCULOSKELETAL:  No edema; No deformity  SKIN: Warm and dry NEUROLOGIC:  Alert and oriented x 3 PSYCHIATRIC:  Normal affect   ASSESSMENT:    1. DOE (dyspnea on exertion)    PLAN:    In order of problems listed above:  #DOE: #Pre-operative evaluation prior to right TKA: Patient planned for right TKA for arthritis. Previously very active but activity limited due to arthritis and unable to perform >4METs. Myoview in 2014 negative for ischemia and patient without anginal symptoms. Given exercise limitations and DOE, however, will proceed with myoview as part of pre-operative evaluation. -Check lexiscan -If low or intermediate risk, no further testing needed prior to surgery  #Palpitations : E-cardio monitor with 0% Afib. TTE with mild LA enlargement. On ASA. -Continue to monitor -Continue ASA 81mg  daily -Continue atenolol 25mg  daily  #HTN:  Well controlled -Continue amlodipine 5mg  -Continue atenolol 25mg    #HLD: Intolerant to simva, atorva and crestor. -Continue pravastatin 10mg  -Start zetia 10mg  daily -Repeat lipid panel in 6 weeks -Goal LDL <100  Shared Decision Making/Informed Consent The risks [chest pain, shortness of breath, cardiac arrhythmias, dizziness, blood pressure fluctuations, myocardial infarction, stroke/transient ischemic attack, nausea, vomiting, allergic reaction, radiation exposure, metallic taste sensation and life-threatening complications (estimated to be 1 in 10,000)], benefits (risk stratification, diagnosing coronary artery disease, treatment guidance) and alternatives of a nuclear stress test were discussed in detail with Mr. Jewkes and he agrees to  proceed.    Medication Adjustments/Labs and Tests Ordered: Current medicines are reviewed at length with the patient today.  Concerns regarding medicines are outlined above.  Orders Placed This Encounter  Procedures  . Lipid Profile  . Cardiac Stress Test: Informed Consent Details: Physician/Practitioner Attestation; Transcribe to consent form and obtain patient signature  . Myocardial Perfusion Imaging  . EKG 12-Lead   Meds ordered this encounter  Medications  . ezetimibe (  ZETIA) 10 MG tablet    Sig: Take 1 tablet (10 mg total) by mouth daily.    Dispense:  90 tablet    Refill:  3    Patient Instructions  Medication Instructions:  Your physician has recommended you make the following change in your medication: 1. Start Zetia one tablet by mouth ( 10 mg) daily, sent in to requested pharmacy # [id number].   *If you need a refill on your cardiac medications before your next appointment, please call your pharmacy*   Lab Work: Your physician recommends that you return for a FASTING lipid profile April 19 between 7:30 and 4:30 fasting night before after 12:00.   If you have labs (blood work) drawn today and your tests are completely normal, you will receive your results only by: Marland Kitchen MyChart Message (if you have MyChart) OR . A paper copy in the mail If you have any lab test that is abnormal or we need to change your treatment, we will call you to review the results.   Testing/Procedures: Your physician has requested that you have a lexiscan myoview. For further information please visit [email or web address]. Please follow instruction sheet, as given.  You are scheduled for a Myocardial Perfusion Imaging Study on Monday March 14  at 8:00 am.   Please arrive 15 minutes prior to your appointment time for registration and insurance purposes.   The test will take approximately 3 to 4 hours to complete; you may bring reading material. If someone comes with you to your appointment, they will need  to remain in the main lobby due to limited space in the testing area.    How to prepare for your Myocardial Perfusion test:   Do not eat or drink 3 hours prior to your test, except you may have water.    Do not consume products containing caffeine (regular or decaffeinated) 12 hours prior to your test (ex: [name], chocolate, soda, tea)   Do bring a list of your current medications with you. If not listed below, you may take your medications as normal.    Bring any held medication to your appointment, as you may be required to take it once the test is complete.   Do wear comfortable clothes (no dresses or overalls) and walking shoes. Tennis shoes are preferred. No heels or open toed shoes.  Do not wear cologne, perfume, aftershave or lotions (deodorant is allowed).   If these instructions are not followed, you test will have to be rescheduled.   Please report to [address] for your test. If you have questions or concerns about your appointment, please call the Nuclear Lab at [phone].  If you cannot keep your appointment, please provide 24 hour notification to the Nuclear lab to avoid a possible $50 charge to your account.        Follow-Up: At [facility], you and your health needs are our priority.  As part of our continuing mission to provide you with exceptional heart care, we have created designated Provider Care Teams.  These Care Teams include your primary Cardiologist (physician) and Advanced Practice Providers (APPs -  Physician Assistants and Nurse Practitioners) who all work together to provide you with the care you need, when you need it.  We recommend signing up for the patient portal called "MyChart".  Sign up information is provided on this After Visit Summary.  MyChart is used to connect with patients for Virtual Visits (Telemedicine).  Patients are able to view lab/test  results, encounter notes, upcoming appointments, etc.  Non-urgent  messages can be sent to your provider as well.   To learn more about what you can do with MyChart, go to [email or web address].    Your next appointment:   6 month(s)  The format for your next appointment:   In Person  Provider:   Gwyndolyn Kaufman, MD   Other Instructions Your physician wants you to follow-up in: 6 months with Dr. Johney Frame.  You will receive a reminder letter in the mail two months in advance. If you don't receive a letter, please call our office to schedule the follow-up appointment.      Signed, Freada Bergeron, MD  11/19/2020 12:53 PM    [place]

## 2020-11-19 ENCOUNTER — Ambulatory Visit (INDEPENDENT_AMBULATORY_CARE_PROVIDER_SITE_OTHER): Payer: Medicare Other | Admitting: Cardiology

## 2020-11-19 ENCOUNTER — Other Ambulatory Visit: Payer: Self-pay

## 2020-11-19 ENCOUNTER — Encounter: Payer: Self-pay | Admitting: Cardiology

## 2020-11-19 VITALS — BP 120/62 | HR 62 | Ht 74.5 in | Wt 263.0 lb

## 2020-11-19 DIAGNOSIS — I10 Essential (primary) hypertension: Secondary | ICD-10-CM

## 2020-11-19 DIAGNOSIS — R0600 Dyspnea, unspecified: Secondary | ICD-10-CM | POA: Diagnosis not present

## 2020-11-19 DIAGNOSIS — Z01810 Encounter for preprocedural cardiovascular examination: Secondary | ICD-10-CM | POA: Diagnosis not present

## 2020-11-19 DIAGNOSIS — Z789 Other specified health status: Secondary | ICD-10-CM

## 2020-11-19 DIAGNOSIS — E782 Mixed hyperlipidemia: Secondary | ICD-10-CM | POA: Diagnosis not present

## 2020-11-19 DIAGNOSIS — R0609 Other forms of dyspnea: Secondary | ICD-10-CM

## 2020-11-19 MED ORDER — EZETIMIBE 10 MG PO TABS: 10.0000 mg | ORAL_TABLET | Freq: Every day | ORAL | 3 refills | Status: DC

## 2020-11-19 NOTE — Patient Instructions (Signed)
Medication Instructions:  Your physician has recommended you make the following change in your medication: 1. Start Zetia one tablet by mouth ( 10 mg) daily, sent in to requested pharmacy # 90.   *If you need a refill on your cardiac medications before your next appointment, please call your pharmacy*   Lab Work: Your physician recommends that you return for a FASTING lipid profile April 19 between 7:30 and 4:30 fasting night before after 12:00.   If you have labs (blood work) drawn today and your tests are completely normal, you will receive your results only by: Marland Kitchen MyChart Message (if you have MyChart) OR . A paper copy in the mail If you have any lab test that is abnormal or we need to change your treatment, we will call you to review the results.   Testing/Procedures: Your physician has requested that you have a lexiscan myoview. For further information please visit [email or web address]. Please follow instruction sheet, as given.  You are scheduled for a Myocardial Perfusion Imaging Study on Monday March 14  at 8:00 am.   Please arrive 15 minutes prior to your appointment time for registration and insurance purposes.   The test will take approximately 3 to 4 hours to complete; you may bring reading material. If someone comes with you to your appointment, they will need to remain in the main lobby due to limited space in the testing area.    How to prepare for your Myocardial Perfusion test:   Do not eat or drink 3 hours prior to your test, except you may have water.    Do not consume products containing caffeine (regular or decaffeinated) 12 hours prior to your test (ex: coffee, chocolate, soda, tea)   Do bring a list of your current medications with you. If not listed below, you may take your medications as normal.    Bring any held medication to your appointment, as you may be required to take it once the test is complete.   Do wear comfortable clothes (no dresses or overalls)  and walking shoes. Tennis shoes are preferred. No heels or open toed shoes.  Do not wear cologne, perfume, aftershave or lotions (deodorant is allowed).   If these instructions are not followed, you test will have to be rescheduled.   Please report to [address] for your test. If you have questions or concerns about your appointment, please call the Nuclear Lab at [phone].  If you cannot keep your appointment, please provide 24 hour notification to the Nuclear lab to avoid a possible $50 charge to your account.        Follow-Up: At [facility], you and your health needs are our priority.  As part of our continuing mission to provide you with exceptional heart care, we have created designated Provider Care Teams.  These Care Teams include your primary Cardiologist (physician) and Advanced Practice Providers (APPs -  Physician Assistants and Nurse Practitioners) who all work together to provide you with the care you need, when you need it.  We recommend signing up for the patient portal called "MyChart".  Sign up information is provided on this After Visit Summary.  MyChart is used to connect with patients for Virtual Visits (Telemedicine).  Patients are able to view lab/test results, encounter notes, upcoming appointments, etc.  Non-urgent messages can be sent to your provider as well.   To learn more about what you can do with MyChart, go to [email or web address].    Your  next appointment:   6 month(s)  The format for your next appointment:   In Person  Provider:   Gwyndolyn Kaufman, MD   Other Instructions Your physician wants you to follow-up in: 6 months with Dr. Johney Frame.  You will receive a reminder letter in the mail two months in advance. If you don't receive a letter, please call our office to schedule the follow-up appointment.

## 2020-11-20 ENCOUNTER — Telehealth (HOSPITAL_COMMUNITY): Payer: Self-pay | Admitting: *Deleted

## 2020-11-20 NOTE — Telephone Encounter (Signed)
Patient given detailed instructions per Myocardial Perfusion Study Information Sheet for the test on 11/25/20 at 8:00. Patient notified to arrive 15 minutes early and that it is imperative to arrive on time for appointment to keep from having the test rescheduled.  If you need to cancel or reschedule your appointment, please call the office within 24 hours of your appointment. . Patient verbalized understanding.Veronia Beets

## 2020-11-25 ENCOUNTER — Ambulatory Visit (HOSPITAL_COMMUNITY): Payer: Medicare Other | Attending: Cardiology

## 2020-11-25 ENCOUNTER — Other Ambulatory Visit: Payer: Self-pay

## 2020-11-25 DIAGNOSIS — R0600 Dyspnea, unspecified: Secondary | ICD-10-CM | POA: Diagnosis not present

## 2020-11-25 DIAGNOSIS — R0609 Other forms of dyspnea: Secondary | ICD-10-CM

## 2020-11-25 LAB — MYOCARDIAL PERFUSION IMAGING
LV dias vol: 132 mL (ref 62–150)
LV sys vol: 66 mL
Peak HR: 67 {beats}/min
Rest HR: 56 {beats}/min
SDS: 4
SRS: 3
SSS: 7
TID: 1.04

## 2020-11-25 MED ORDER — REGADENOSON 0.4 MG/5ML IV SOLN
0.4000 mg | Freq: Once | INTRAVENOUS | Status: AC
  Administered 2020-11-25: 0.4 mg via INTRAVENOUS

## 2020-11-25 MED ORDER — TECHNETIUM TC 99M TETROFOSMIN IV KIT
30.9000 | PACK | Freq: Once | INTRAVENOUS | Status: AC | PRN
  Administered 2020-11-25: 30.9 via INTRAVENOUS
  Filled 2020-11-25: qty 31

## 2020-11-25 MED ORDER — TECHNETIUM TC 99M TETROFOSMIN IV KIT
10.8000 | PACK | Freq: Once | INTRAVENOUS | Status: AC | PRN
  Administered 2020-11-25: 10.8 via INTRAVENOUS
  Filled 2020-11-25: qty 11

## 2020-12-04 ENCOUNTER — Telehealth: Payer: Self-pay | Admitting: Orthopaedic Surgery

## 2020-12-04 NOTE — Telephone Encounter (Signed)
Patient called requesting a call back concerning his surgery date. Patient states he left several message and need to speak with PA Carlis Abbott or Dr. Ninfa Linden. Please call patient at [phone].

## 2020-12-04 NOTE — Telephone Encounter (Signed)
I called patient and scheduled surgery. 

## 2020-12-20 NOTE — Progress Notes (Addendum)
Sent message, via epic in basket, requesting orders in epic from Rittman PA.

## 2020-12-24 ENCOUNTER — Other Ambulatory Visit: Payer: Self-pay | Admitting: Physician Assistant

## 2020-12-24 NOTE — Progress Notes (Addendum)
PCP - Ronnald Collum , NP Cardiologist - Clearance Gwyndolyn Kaufman, MD noted  in stress reoprt  PPM/ICD -  Device Orders -  Rep Notified -   Chest x-ray -  EKG - 11-19-20 Stress Test - 11-25-20 ECHO -  Cardiac Cath -   Sleep Study -  CPAP -   Fasting Blood Sugar -  Checks Blood Sugar _____ times a day  Blood Thinner Instructions: Aspirin Instructions:81 mg  ERAS Protcol - PRE-SURGERY Ensure or G2-   COVID TEST- 4-20  Activity- Able to walk a flight of stairs without SOB Anesthesia review: HTN, asthma , COPD  Patient denies shortness of breath, fever, cough and chest pain at PAT appointment   All instructions explained to the patient, with a verbal understanding of the material. Patient agrees to go over the instructions while at home for a better understanding. Patient also instructed to self quarantine after being tested for COVID-19. The opportunity to ask questions was provided.

## 2020-12-24 NOTE — Patient Instructions (Addendum)
DUE TO COVID-19 ONLY ONE VISITOR IS ALLOWED TO COME WITH YOU AND STAY IN THE WAITING ROOM ONLY DURING PRE OP AND PROCEDURE DAY OF SURGERY.   TWO VISITOR  MAY VISIT WITH YOU AFTER SURGERY IN YOUR PRIVATE ROOM DURING VISITING HOURS ONLY!  YOU NEED TO HAVE A COVID 19 TEST ON__4-20_____ @_______ , THIS TEST MUST BE DONE BEFORE SURGERY,  COVID TESTING SITE [address] [place] [address],   IT IS ON THE RIGHT GOING OUT [address] APPROXIMATELY  2 MINUTES PAST [employer] ON THE RIGHT. ONCE YOUR COVID TEST IS COMPLETED,  PLEASE BEGIN THE QUARANTINE INSTRUCTIONS AS OUTLINED IN YOUR HANDOUT.                H[facility]AM [facility]  12/24/2020   Your procedure is scheduled on 01-03-21   Report to [facility] Main  Entrance   Report to admitting at         [place]  AM     Call this number if you have problems the morning of surgery [phone]    Remember: NO SOLID FOOD AFTER MIDNIGHT THE NIGHT PRIOR TO SURGERY. NOTHING BY MOUTH EXCEPT CLEAR LIQUIDS UNTIL   0900 am   . PLEASE FINISH ENSURE DRINK PER SURGEON ORDER  WHICH NEEDS TO BE COMPLETED AT     0900 am then nothing by mouth.     CLEAR LIQUID DIET   Foods Allowed                                                                                  Foods Excluded Water Black Coffee and tea, regular and decaf                                         liquids that you cannot  Plain Jell-O any favor except red or purple                                           see through such as: Fruit ices (not with fruit pulp)                                                     milk, soups, orange juice  Iced Popsicles                                                       All solid food Carbonated beverages, regular and diet  Cranberry, grape and apple juices Sports drinks like Gatorade Lightly seasoned clear broth or consume(fat free) Sugar, honey  syrup   _____________________________________________________________________     BRUSH YOUR TEETH MORNING OF SURGERY AND RINSE YOUR MOUTH OUT, NO CHEWING GUM CANDY OR MINTS.     Take these medicines the morning of surgery with A SIP OF WATER: amlodipine, atenelol, prevastatin, Hydrocodone if needed, celexa, zetia,singulair, inhaler and bring it with you              You may not have any metal on your body including hair pins and              piercings  Do not wear jewelry,  lotions, powders or perfumes, deodorant              Men may shave face and neck.   Do not bring valuables to the hospital. [place].  Contacts, dentures or bridgework may not be worn into surgery.      Patients discharged the day of surgery will not be allowed to drive home. IF YOU ARE HAVING SURGERY AND GOING HOME THE SAME DAY, YOU MUST HAVE AN ADULT TO DRIVE YOU HOME AND BE WITH YOU FOR 24 HOURS. YOU MAY GO HOME BY TAXI OR UBER OR ORTHERWISE, BUT AN ADULT MUST ACCOMPANY YOU HOME AND STAY WITH YOU FOR 24 HOURS.  Name and phone number of your driver:  Special Instructions: N/A              Please read over the following fact sheets you were given: _____________________________________________________________________             [facility] - Preparing for Surgery Before surgery, you can play an important role.  Because skin is not sterile, your skin needs to be as free of germs as possible.  You can reduce the number of germs on your skin by washing with CHG (chlorahexidine gluconate) soap before surgery.  CHG is an antiseptic cleaner which kills germs and bonds with the skin to continue killing germs even after washing. Please DO NOT use if you have an allergy to CHG or antibacterial soaps.  If your skin becomes reddened/irritated stop using the CHG and inform your nurse when you arrive at Short Stay. Do not shave (including legs and underarms) for at least  48 hours prior to the first CHG shower.  You may shave your face/neck. Please follow these instructions carefully:  1.  Shower with CHG Soap the night before surgery and the  morning of Surgery.  2.  If you choose to wash your hair, wash your hair first as usual with your  normal  shampoo.  3.  After you shampoo, rinse your hair and body thoroughly to remove the  shampoo.                           4.  Use CHG as you would any other liquid soap.  You can apply chg directly  to the skin and wash                       Gently with a scrungie or clean washcloth.  5.  Apply the CHG Soap to your body ONLY FROM THE NECK DOWN.   Do not use on face/ open  Wound or open sores. Avoid contact with eyes, ears mouth and genitals (private parts).                       Wash face,  Genitals (private parts) with your normal soap.             6.  Wash thoroughly, paying special attention to the area where your surgery  will be performed.  7.  Thoroughly rinse your body with warm water from the neck down.  8.  DO NOT shower/wash with your normal soap after using and rinsing off  the CHG Soap.                9.  Pat yourself dry with a clean towel.            10.  Wear clean pajamas.            11.  Place clean sheets on your bed the night of your first shower and do not  sleep with pets. Day of Surgery : Do not apply any lotions/deodorants the morning of surgery.  Please wear clean clothes to the hospital/surgery center.  FAILURE TO FOLLOW THESE INSTRUCTIONS MAY RESULT IN THE CANCELLATION OF YOUR SURGERY PATIENT SIGNATURE_________________________________  NURSE SIGNATURE__________________________________  ________________________________________________________________________   Adam Phenix  An incentive spirometer is a tool that can help keep your lungs clear and active. This tool measures how well you are filling your lungs with each breath. Taking long deep breaths may  help reverse or decrease the chance of developing breathing (pulmonary) problems (especially infection) following:  A long period of time when you are unable to move or be active. BEFORE THE PROCEDURE   If the spirometer includes an indicator to show your best effort, your nurse or respiratory therapist will set it to a desired goal.  If possible, sit up straight or lean slightly forward. Try not to slouch.  Hold the incentive spirometer in an upright position. INSTRUCTIONS FOR USE  1. Sit on the edge of your bed if possible, or sit up as far as you can in bed or on a chair. 2. Hold the incentive spirometer in an upright position. 3. Breathe out normally. 4. Place the mouthpiece in your mouth and seal your lips tightly around it. 5. Breathe in slowly and as deeply as possible, raising the piston or the ball toward the top of the column. 6. Hold your breath for 3-5 seconds or for as long as possible. Allow the piston or ball to fall to the bottom of the column. 7. Remove the mouthpiece from your mouth and breathe out normally. 8. Rest for a few seconds and repeat Steps 1 through 7 at least 10 times every 1-2 hours when you are awake. Take your time and take a few normal breaths between deep breaths. 9. The spirometer may include an indicator to show your best effort. Use the indicator as a goal to work toward during each repetition. 10. After each set of 10 deep breaths, practice coughing to be sure your lungs are clear. If you have an incision (the cut made at the time of surgery), support your incision when coughing by placing a pillow or rolled up towels firmly against it. Once you are able to get out of bed, walk around indoors and cough well. You may stop using the incentive spirometer when instructed by your caregiver.  RISKS AND COMPLICATIONS  Take your time so you do not get  dizzy or light-headed.  If you are in pain, you may need to take or ask for pain medication before doing  incentive spirometry. It is harder to take a deep breath if you are having pain. AFTER USE  Rest and breathe slowly and easily.  It can be helpful to keep track of a log of your progress. Your caregiver can provide you with a simple table to help with this. If you are using the spirometer at home, follow these instructions: [place] IF:   You are having difficultly using the spirometer.  You have trouble using the spirometer as often as instructed.  Your pain medication is not giving enough relief while using the spirometer.  You develop fever of 100.5 F (38.1 C) or higher. SEEK IMMEDIATE MEDICAL CARE IF:   You cough up bloody sputum that had not been present before.  You develop fever of 102 F (38.9 C) or greater.  You develop worsening pain at or near the incision site. MAKE SURE YOU:   Understand these instructions.  Will watch your condition.  Will get help right away if you are not doing well or get worse. Document Released: 01/11/2007 Document Revised: 11/23/2011 Document Reviewed: 03/14/2007 [facility] Patient Information 2014 [place], [place].   ________________________________________________________________________

## 2020-12-25 ENCOUNTER — Other Ambulatory Visit: Payer: Self-pay | Admitting: Nurse Practitioner

## 2020-12-25 ENCOUNTER — Encounter (HOSPITAL_COMMUNITY): Payer: Self-pay

## 2020-12-25 ENCOUNTER — Other Ambulatory Visit: Payer: Self-pay

## 2020-12-25 ENCOUNTER — Encounter (HOSPITAL_COMMUNITY)
Admission: RE | Admit: 2020-12-25 | Discharge: 2020-12-25 | Disposition: A | Discharge status: Home / Self Care | Payer: Medicare Other | Source: Ambulatory Visit | Attending: Orthopaedic Surgery | Admitting: Orthopaedic Surgery

## 2020-12-25 DIAGNOSIS — Z01812 Encounter for preprocedural laboratory examination: Secondary | ICD-10-CM | POA: Insufficient documentation

## 2020-12-25 HISTORY — DX: Chronic obstructive pulmonary disease, unspecified: J44.9

## 2020-12-25 LAB — BASIC METABOLIC PANEL
Anion gap: 9 (ref 5–15)
BUN: 19 mg/dL (ref 8–23)
CO2: 24 mmol/L (ref 22–32)
Calcium: 9.6 mg/dL (ref 8.9–10.3)
Chloride: 107 mmol/L (ref 98–111)
Creatinine, Ser: 1.13 mg/dL (ref 0.61–1.24)
GFR, Estimated: >60 mL/min (ref >60)
Glucose, Bld: 142 mg/dL — ABNORMAL HIGH (ref 70–99)
Potassium: 4.4 mmol/L (ref 3.5–5.1)
Sodium: 140 mmol/L (ref 135–145)

## 2020-12-25 LAB — CBC
HCT: 41.9 % (ref 39.0–52.0)
Hemoglobin: 13.9 g/dL (ref 13.0–17.0)
MCH: 31.7 pg (ref 26.0–34.0)
MCHC: 33.2 g/dL (ref 30.0–36.0)
MCV: 95.7 fL (ref 80.0–100.0)
Platelets: 217 10*3/uL (ref 150–400)
RBC: 4.38 MIL/uL (ref 4.22–5.81)
RDW: 13.2 % (ref 11.5–15.5)
WBC: 8.1 10*3/uL (ref 4.0–10.5)
nRBC: 0 % (ref 0.0–0.2)

## 2020-12-25 LAB — SURGICAL PCR SCREEN
MRSA, PCR: NEGATIVE
Staphylococcus aureus: NEGATIVE

## 2020-12-26 ENCOUNTER — Other Ambulatory Visit: Payer: Self-pay

## 2020-12-30 ENCOUNTER — Telehealth: Payer: Self-pay | Admitting: *Deleted

## 2020-12-30 NOTE — Telephone Encounter (Signed)
Ortho bundle pre-op call and survey completed.

## 2020-12-30 NOTE — Care Plan (Signed)
Ortho bundle RNCM call to patient to discuss his upcoming Right total knee replacement with Dr. Ninfa Linden on Friday, 01/03/21. He is an Ortho bundle patient through [facility]. Reviewed information regarding this and he is agreeable to Case management. He has a spouse and son that will be assisting at home after discharge. He has all DME needed (FWW, elevated toilet seat in the home). Anticipate HHPT will be needed after short hospital stay. Referral made to [facility] ([facility]) after choice provided. Reviewed all post-op care instructions and these were mailed to patient to his home. Will continue to follow for needs.

## 2020-12-31 ENCOUNTER — Other Ambulatory Visit: Payer: Self-pay

## 2020-12-31 ENCOUNTER — Other Ambulatory Visit: Payer: Medicare Other | Admitting: *Deleted

## 2020-12-31 DIAGNOSIS — R0600 Dyspnea, unspecified: Secondary | ICD-10-CM | POA: Diagnosis not present

## 2020-12-31 DIAGNOSIS — R0609 Other forms of dyspnea: Secondary | ICD-10-CM

## 2020-12-31 DIAGNOSIS — E782 Mixed hyperlipidemia: Secondary | ICD-10-CM | POA: Diagnosis not present

## 2020-12-31 DIAGNOSIS — Z789 Other specified health status: Secondary | ICD-10-CM | POA: Diagnosis not present

## 2020-12-31 DIAGNOSIS — I10 Essential (primary) hypertension: Secondary | ICD-10-CM | POA: Diagnosis not present

## 2020-12-31 LAB — LIPID PANEL
Chol/HDL Ratio: 4.3 ratio (ref 0.0–5.0)
Cholesterol, Total: 181 mg/dL (ref 100–199)
HDL: 42 mg/dL (ref >39)
LDL Chol Calc (NIH): 96 mg/dL (ref 0–99)
Triglycerides: 256 mg/dL — ABNORMAL HIGH (ref 0–149)
VLDL Cholesterol Cal: 43 mg/dL — ABNORMAL HIGH (ref 5–40)

## 2021-01-01 ENCOUNTER — Telehealth: Payer: Self-pay | Admitting: Cardiology

## 2021-01-01 ENCOUNTER — Other Ambulatory Visit (HOSPITAL_COMMUNITY)
Admission: RE | Admit: 2021-01-01 | Discharge: 2021-01-01 | Disposition: A | Discharge status: Home / Self Care | Payer: Medicare Other | Source: Ambulatory Visit | Attending: Orthopaedic Surgery | Admitting: Orthopaedic Surgery

## 2021-01-01 ENCOUNTER — Other Ambulatory Visit: Payer: Self-pay | Admitting: *Deleted

## 2021-01-01 DIAGNOSIS — Z01812 Encounter for preprocedural laboratory examination: Secondary | ICD-10-CM | POA: Insufficient documentation

## 2021-01-01 DIAGNOSIS — Z20822 Contact with and (suspected) exposure to covid-19: Secondary | ICD-10-CM | POA: Insufficient documentation

## 2021-01-01 DIAGNOSIS — M1711 Unilateral primary osteoarthritis, right knee: Secondary | ICD-10-CM

## 2021-01-01 LAB — SARS CORONAVIRUS 2 (TAT 6-24 HRS): SARS Coronavirus 2: NEGATIVE

## 2021-01-01 MED ORDER — ICOSAPENT ETHYL 1 G PO CAPS: 2.0000 g | ORAL_CAPSULE | Freq: Two times a day (BID) | ORAL | 3 refills | Status: DC

## 2021-01-01 NOTE — Telephone Encounter (Signed)
Pt aware of lab results and recommendations per Dr. Johney Frame. He is aware to start taking Vascepa 2 grams po bid, and his other med regimen as is.  Confirmed the pharmacy of choice with the pt.  Pt verbalized understanding and agrees with this plan.  Spoke with our pharmacist here in the clinic, and he did inform me that his pharmacy in [place] will kick back a PA for our office to do, but we should go ahead and send this in for a months supply, then our pharmacist or Prior Auth nurse will be able to do a PA thereafter, and get the cost of his Vascepa reduced to $14 for a month supply.  Pt is aware of this also, and agrees with this plan. Pt appreciative for all the assistance provided.

## 2021-01-01 NOTE — Telephone Encounter (Signed)
-----   Message from Freada Bergeron, MD sent at 01/01/2021  8:06 AM EDT ----- His LDL looks better but his triglycerides are high. Can we start him on vascepa 2g BID as he is not tolerant of many statins. Thank you!

## 2021-01-01 NOTE — Telephone Encounter (Signed)
Pt is returning a call from this morning regarding Labwork results

## 2021-01-02 NOTE — Telephone Encounter (Signed)
**Note De-Identified Rickeya Manus Obfuscation** Icosapent PA started through covermymeds. Key: [id number]

## 2021-01-02 NOTE — H&P (Signed)
TOTAL KNEE ADMISSION H&P  Patient is being admitted for right total knee arthroplasty.  Subjective:  Chief Complaint:right knee pain.  HPI: DHRUVA ORNDOFF, 75 y.o. male, has a history of pain and functional disability in the right knee due to arthritis and has failed non-surgical conservative treatments for greater than 12 weeks to includeNSAID's and/or analgesics, corticosteriod injections, viscosupplementation injections, flexibility and strengthening excercises, supervised PT with diminished ADL's post treatment, use of assistive devices, weight reduction as appropriate and activity modification.  Onset of symptoms was gradual, starting 5 years ago with gradually worsening course since that time. The patient noted no past surgery on the right knee(s).  Patient currently rates pain in the right knee(s) at 10 out of 10 with activity. Patient has night pain, worsening of pain with activity and weight bearing, pain that interferes with activities of daily living, pain with passive range of motion, crepitus and joint swelling.  Patient has evidence of subchondral sclerosis, periarticular osteophytes and joint space narrowing by imaging studies. There is no active infection.  Patient Active Problem List   Diagnosis Date Noted  . Left lower quadrant pain 09/25/2020  . Diverticular disease of colon 09/25/2020  . Colon cancer screening 09/25/2020  . Statin intolerance 12/27/2018  . BMI 28.0-28.9,adult 11/01/2018  . H/O arthroscopy of shoulder 02/10/2018  . Primary osteoarthritis of left knee 11/25/2016  . Primary osteoarthritis of right knee 11/25/2016  . Essential hypertension 06/28/2015  . H/O prostate cancer 06/28/2015  . Hereditary and idiopathic peripheral neuropathy 04/17/2015  . Carpal tunnel syndrome 04/17/2015  . Hyperlipemia 06/02/2013  . Asthma, chronic 06/02/2013  . Depression 06/02/2013  . GERD 06/04/2009  . LEG PAIN, BILATERAL 06/04/2009  . PERCUTANEOUS TRANSLUMINAL CORONARY  ANGIOPLASTY, HX OF 06/04/2009   Past Medical History:  Diagnosis Date  . Asthma, chronic 06/02/2013  . Carpal tunnel syndrome 04/17/2015   Bilateral  . COPD (chronic obstructive pulmonary disease) ([place])   . Depression 06/02/2013  . DYSPNEA ON EXERTION 06/04/2009   Qualifier: Diagnosis of  By: Domenic Polite, MD, Phillips Hay   . GERD (gastroesophageal reflux disease)   . Hereditary and idiopathic peripheral neuropathy 04/17/2015  . Hyperlipemia 06/02/2013  . Hypertension   . LEG PAIN, BILATERAL 06/04/2009   Qualifier: Diagnosis of  By: Domenic Polite, MD, Phillips Hay   . PERCUTANEOUS TRANSLUMINAL CORONARY ANGIOPLASTY, HX OF 06/04/2009   Qualifier: Diagnosis of  By: Domenic Polite, MD, Phillips Hay   . Prostate CA ([place])    and basal cell nose    Past Surgical History:  Procedure Laterality Date  . CARDIAC CATHETERIZATION  1997  . EXCISION MASS NECK Left 11/12/2020   Procedure: EXCISION LEFT POSTERIOR NECK MASS;  Surgeon: Coralie Keens, MD;  Location: [facility];  Service: General;  Laterality: Left;  . EYE SURGERY Bilateral 2020   cataracts removed  . HERNIA REPAIR    . HIATAL HERNIA REPAIR    . LAPAROSCOPIC GASTRIC BANDING  8921   [facility] fundoplication  . MENISCUS REPAIR Left 2012  . ROBOT ASSISTED LAPAROSCOPIC RADICAL PROSTATECTOMY    . ROTATOR CUFF REPAIR Right 2019    No current facility-administered medications for this encounter.   Current Outpatient Medications  Medication Sig Dispense Refill Last Dose  . amLODipine (NORVASC) 5 MG tablet Take 1 tablet (5 mg total) by mouth daily. 90 tablet 1   . aspirin EC 81 MG tablet Take 81 mg by mouth daily.     Marland Kitchen atenolol (TENORMIN) 25 MG tablet Take  1 tablet (25 mg total) by mouth daily. 90 tablet 1   . budesonide-formoterol (SYMBICORT) 160-4.5 MCG/ACT inhaler Inhale 2 puffs into the lungs 2 (two) times daily. (Patient taking differently: Inhale 2 puffs into the lungs 2 (two) times daily as needed (shortness of  breath).) 3 each 3   . Cholecalciferol (VITAMIN D3) 125 MCG (5000 UT) TABS Take 5,000 Units by mouth daily.     . citalopram (CELEXA) 20 MG tablet Take 1 tablet (20 mg total) by mouth daily. 90 tablet 1   . ezetimibe (ZETIA) 10 MG tablet Take 1 tablet (10 mg total) by mouth daily. 90 tablet 3   . HYDROcodone-acetaminophen (NORCO/VICODIN) 5-325 MG tablet Take 1-2 tablets by mouth every 6 (six) hours as needed for moderate pain. 20 tablet 0   . loperamide (IMODIUM A-D) 2 MG tablet Take 2 mg by mouth daily.     Marland Kitchen loratadine (CLARITIN) 10 MG tablet Take 10 mg by mouth daily as needed for allergies.     . montelukast (SINGULAIR) 10 MG tablet Take 1 tablet (10 mg total) by mouth daily. 90 tablet 1   . naproxen (NAPROSYN) 500 MG tablet Take 500 mg by mouth daily.     . pravastatin (PRAVACHOL) 20 MG tablet Take 10 mg by mouth daily.     . traMADol (ULTRAM) 50 MG tablet Take 1 tablet (50 mg total) by mouth every 6 (six) hours as needed for moderate pain. 20 tablet 0   . cyanocobalamin (,VITAMIN B-12,) 1000 MCG/ML injection Inject 1 mL (1,000 mcg total) into the muscle every 30 (thirty) days. 3 mL 0   . icosapent Ethyl (VASCEPA) 1 g capsule Take 2 capsules (2 g total) by mouth 2 (two) times daily. 120 capsule 3   . sildenafil (REVATIO) 20 MG tablet Take 1 tablet (20 mg total) by mouth 3 (three) times daily as needed. (Patient not taking: No sig reported) 10 tablet 5 Not Taking at Unknown time   Allergies  Allergen Reactions  . Fenofibrate Other (See Comments)    Pt reports causes joint pains  . Rosuvastatin Other (See Comments)    Pt reports causes joint pains    Social History   Tobacco Use  . Smoking status: Former Smoker    Packs/day: 1.00    Years: 20.00    Pack years: 20.00    Types: Cigarettes    Quit date: 01/18/1993    Years since quitting: 27.9  . Smokeless tobacco: Never Used  Substance Use Topics  . Alcohol use: Yes    Alcohol/week: 2.0 standard drinks    Types: 2 Cans of beer per  week    Comment: occasional    Family History  Problem Relation Age of Onset  . Heart disease Other        Early Cardiac disease for uncle is their 74's as well as deceased.  Mar[place] Kitchen Heart failure Mother   . Dementia Mother 1  . Cancer Father 22       testicular, lung  . Esophageal cancer Brother      Review of Systems  Musculoskeletal: Positive for gait problem and joint swelling.  All other systems reviewed and are negative.   Objective:  Physical Exam Vitals reviewed.  Constitutional:      Appearance: Normal appearance.  HENT:     Head: Normocephalic and atraumatic.  Eyes:     Extraocular Movements: Extraocular movements intact.     Pupils: Pupils are equal, round, and reactive to light.  Cardiovascular:     Rate and Rhythm: Normal rate.     Pulses: Normal pulses.  Pulmonary:     Effort: Pulmonary effort is normal.     Breath sounds: Normal breath sounds.  Abdominal:     Palpations: Abdomen is soft.  Musculoskeletal:     Cervical back: Normal range of motion and neck supple.     Right knee: Effusion, bony tenderness and crepitus present. Decreased range of motion. Tenderness present over the medial joint line, lateral joint line and patellar tendon. Abnormal alignment and abnormal meniscus.  Neurological:     Mental Status: He is alert and oriented to person, place, and time.  Psychiatric:        Behavior: Behavior normal.     Vital signs in last 24 hours:    Labs:   Estimated body mass index is 33.19 kg/m as calculated from the following:   Height as of 12/25/20: 6' 2.5" (1.892 m).   Weight as of 12/25/20: 118.8 kg.   Imaging Review Plain radiographs demonstrate severe degenerative joint disease of the right knee(s). The overall alignment ismild varus. The bone quality appears to be good for age and reported activity level.      Assessment/Plan:  End stage arthritis, right knee   The patient history, physical examination, clinical judgment of the  provider and imaging studies are consistent with end stage degenerative joint disease of the right knee(s) and total knee arthroplasty is deemed medically necessary. The treatment options including medical management, injection therapy arthroscopy and arthroplasty were discussed at length. The risks and benefits of total knee arthroplasty were presented and reviewed. The risks due to aseptic loosening, infection, stiffness, patella tracking problems, thromboembolic complications and other imponderables were discussed. The patient acknowledged the explanation, agreed to proceed with the plan and consent was signed. Patient is being admitted for inpatient treatment for surgery, pain control, PT, OT, prophylactic antibiotics, VTE prophylaxis, progressive ambulation and ADL's and discharge planning. The patient is planning to be discharged home with home health services

## 2021-01-03 ENCOUNTER — Ambulatory Visit (HOSPITAL_COMMUNITY): Payer: Medicare Other | Admitting: Certified Registered"

## 2021-01-03 ENCOUNTER — Observation Stay (HOSPITAL_COMMUNITY)
Admission: RE | Admit: 2021-01-03 | Discharge: 2021-01-04 | Disposition: A | Discharge status: Home / Self Care | Payer: Medicare Other | Source: Ambulatory Visit | Attending: Orthopaedic Surgery | Admitting: Orthopaedic Surgery

## 2021-01-03 ENCOUNTER — Other Ambulatory Visit: Payer: Self-pay

## 2021-01-03 ENCOUNTER — Encounter (HOSPITAL_COMMUNITY)
Admission: RE | Disposition: A | Discharge status: Home / Self Care | Payer: Self-pay | Source: Ambulatory Visit | Attending: Orthopaedic Surgery

## 2021-01-03 ENCOUNTER — Observation Stay (HOSPITAL_COMMUNITY): Payer: Medicare Other

## 2021-01-03 ENCOUNTER — Encounter (HOSPITAL_COMMUNITY): Payer: Self-pay | Admitting: Orthopaedic Surgery

## 2021-01-03 DIAGNOSIS — Z8546 Personal history of malignant neoplasm of prostate: Secondary | ICD-10-CM | POA: Diagnosis not present

## 2021-01-03 DIAGNOSIS — J45909 Unspecified asthma, uncomplicated: Secondary | ICD-10-CM | POA: Insufficient documentation

## 2021-01-03 DIAGNOSIS — M1711 Unilateral primary osteoarthritis, right knee: Secondary | ICD-10-CM | POA: Diagnosis not present

## 2021-01-03 DIAGNOSIS — M1712 Unilateral primary osteoarthritis, left knee: Secondary | ICD-10-CM | POA: Diagnosis not present

## 2021-01-03 DIAGNOSIS — G8918 Other acute postprocedural pain: Secondary | ICD-10-CM | POA: Diagnosis not present

## 2021-01-03 DIAGNOSIS — Z9889 Other specified postprocedural states: Secondary | ICD-10-CM | POA: Diagnosis not present

## 2021-01-03 DIAGNOSIS — E785 Hyperlipidemia, unspecified: Secondary | ICD-10-CM | POA: Diagnosis not present

## 2021-01-03 DIAGNOSIS — E669 Obesity, unspecified: Secondary | ICD-10-CM | POA: Diagnosis not present

## 2021-01-03 DIAGNOSIS — Z79899 Other long term (current) drug therapy: Secondary | ICD-10-CM | POA: Insufficient documentation

## 2021-01-03 DIAGNOSIS — I10 Essential (primary) hypertension: Secondary | ICD-10-CM | POA: Insufficient documentation

## 2021-01-03 DIAGNOSIS — Z96651 Presence of right artificial knee joint: Secondary | ICD-10-CM | POA: Diagnosis not present

## 2021-01-03 DIAGNOSIS — Z6828 Body mass index (BMI) 28.0-28.9, adult: Secondary | ICD-10-CM | POA: Insufficient documentation

## 2021-01-03 DIAGNOSIS — J449 Chronic obstructive pulmonary disease, unspecified: Secondary | ICD-10-CM | POA: Insufficient documentation

## 2021-01-03 DIAGNOSIS — F329 Major depressive disorder, single episode, unspecified: Secondary | ICD-10-CM | POA: Diagnosis not present

## 2021-01-03 DIAGNOSIS — Z471 Aftercare following joint replacement surgery: Secondary | ICD-10-CM | POA: Diagnosis not present

## 2021-01-03 DIAGNOSIS — Z87891 Personal history of nicotine dependence: Secondary | ICD-10-CM | POA: Diagnosis not present

## 2021-01-03 DIAGNOSIS — K5730 Diverticulosis of large intestine without perforation or abscess without bleeding: Secondary | ICD-10-CM | POA: Insufficient documentation

## 2021-01-03 DIAGNOSIS — R600 Localized edema: Secondary | ICD-10-CM | POA: Diagnosis not present

## 2021-01-03 HISTORY — PX: TOTAL KNEE ARTHROPLASTY: SHX125

## 2021-01-03 LAB — TYPE AND SCREEN
ABO/RH(D): O POS
Antibody Screen: NEGATIVE

## 2021-01-03 SURGERY — ARTHROPLASTY, KNEE, TOTAL
Anesthesia: Spinal | Site: Knee | Laterality: Right

## 2021-01-03 MED ORDER — FENTANYL CITRATE (PF) 100 MCG/2ML IJ SOLN: 25.0000 ug | INTRAMUSCULAR | Status: DC | PRN

## 2021-01-03 MED ORDER — ASPIRIN 81 MG PO CHEW
81.0000 mg | CHEWABLE_TABLET | Freq: Two times a day (BID) | ORAL | Status: DC
  Administered 2021-01-03 – 2021-01-04 (×2): 81 mg via ORAL
  Filled 2021-01-03 (×2): qty 1

## 2021-01-03 MED ORDER — CYANOCOBALAMIN 1000 MCG/ML IJ SOLN: 1000.0000 ug | INTRAMUSCULAR | Status: DC

## 2021-01-03 MED ORDER — PANTOPRAZOLE SODIUM 40 MG PO TBEC
40.0000 mg | DELAYED_RELEASE_TABLET | Freq: Every day | ORAL | Status: DC
  Administered 2021-01-03 – 2021-01-04 (×2): 40 mg via ORAL
  Filled 2021-01-03 (×2): qty 1

## 2021-01-03 MED ORDER — VITAMIN D3 25 MCG (1000 UNIT) PO TABS
5000.0000 [IU] | ORAL_TABLET | Freq: Every day | ORAL | Status: DC
  Administered 2021-01-04: 5000 [IU] via ORAL
  Filled 2021-01-03 (×2): qty 5

## 2021-01-03 MED ORDER — PHENOL 1.4 % MT LIQD: 1.0000 | OROMUCOSAL | Status: DC | PRN

## 2021-01-03 MED ORDER — CEFAZOLIN SODIUM-DEXTROSE 2-4 GM/100ML-% IV SOLN
2.0000 g | Freq: Four times a day (QID) | INTRAVENOUS | Status: AC
  Administered 2021-01-03 – 2021-01-04 (×2): 2 g via INTRAVENOUS
  Filled 2021-01-03 (×2): qty 100

## 2021-01-03 MED ORDER — CITALOPRAM HYDROBROMIDE 20 MG PO TABS
20.0000 mg | ORAL_TABLET | Freq: Every day | ORAL | Status: DC
  Administered 2021-01-04: 20 mg via ORAL
  Filled 2021-01-03: qty 1

## 2021-01-03 MED ORDER — BUPIVACAINE IN DEXTROSE 0.75-8.25 % IT SOLN
INTRATHECAL | Status: DC | PRN
  Administered 2021-01-03: 1.8 mL via INTRATHECAL

## 2021-01-03 MED ORDER — METHOCARBAMOL 500 MG PO TABS
500.0000 mg | ORAL_TABLET | Freq: Four times a day (QID) | ORAL | Status: DC | PRN
  Administered 2021-01-03 – 2021-01-04 (×4): 500 mg via ORAL
  Filled 2021-01-03 (×4): qty 1

## 2021-01-03 MED ORDER — DEXAMETHASONE SODIUM PHOSPHATE 10 MG/ML IJ SOLN
INTRAMUSCULAR | Status: AC
  Filled 2021-01-03: qty 1

## 2021-01-03 MED ORDER — ROPIVACAINE HCL 7.5 MG/ML IJ SOLN
INTRAMUSCULAR | Status: DC | PRN
  Administered 2021-01-03: 20 mL via PERINEURAL

## 2021-01-03 MED ORDER — ACETAMINOPHEN 325 MG PO TABS: 325.0000 mg | ORAL_TABLET | Freq: Four times a day (QID) | ORAL | Status: DC | PRN

## 2021-01-03 MED ORDER — LACTATED RINGERS IV SOLN: INTRAVENOUS | Status: DC

## 2021-01-03 MED ORDER — DIPHENHYDRAMINE HCL 12.5 MG/5ML PO ELIX: 12.5000 mg | ORAL_SOLUTION | ORAL | Status: DC | PRN

## 2021-01-03 MED ORDER — METOCLOPRAMIDE HCL 5 MG/ML IJ SOLN: 5.0000 mg | Freq: Three times a day (TID) | INTRAMUSCULAR | Status: DC | PRN

## 2021-01-03 MED ORDER — OXYCODONE HCL 5 MG PO TABS: 5.0000 mg | ORAL_TABLET | Freq: Once | ORAL | Status: DC | PRN

## 2021-01-03 MED ORDER — MIDAZOLAM HCL 2 MG/2ML IJ SOLN
INTRAMUSCULAR | Status: DC | PRN
  Administered 2021-01-03: 1 mg via INTRAVENOUS

## 2021-01-03 MED ORDER — PRAVASTATIN SODIUM 20 MG PO TABS
10.0000 mg | ORAL_TABLET | Freq: Every day | ORAL | Status: DC
  Administered 2021-01-04: 10 mg via ORAL
  Filled 2021-01-03: qty 1

## 2021-01-03 MED ORDER — ONDANSETRON HCL 4 MG PO TABS: 4.0000 mg | ORAL_TABLET | Freq: Four times a day (QID) | ORAL | Status: DC | PRN

## 2021-01-03 MED ORDER — ORAL CARE MOUTH RINSE
15.0000 mL | Freq: Once | OROMUCOSAL | Status: AC
  Administered 2021-01-03: 15 mL via OROMUCOSAL

## 2021-01-03 MED ORDER — HYDROMORPHONE HCL 1 MG/ML IJ SOLN
0.5000 mg | INTRAMUSCULAR | Status: DC | PRN
  Administered 2021-01-03 (×2): 1 mg via INTRAVENOUS
  Filled 2021-01-03 (×3): qty 1

## 2021-01-03 MED ORDER — OXYCODONE HCL 5 MG PO TABS
10.0000 mg | ORAL_TABLET | ORAL | Status: DC | PRN
  Administered 2021-01-03 – 2021-01-04 (×4): 15 mg via ORAL
  Filled 2021-01-03: qty 2
  Filled 2021-01-03 (×3): qty 3

## 2021-01-03 MED ORDER — MOMETASONE FURO-FORMOTEROL FUM 200-5 MCG/ACT IN AERO
2.0000 | INHALATION_SPRAY | Freq: Two times a day (BID) | RESPIRATORY_TRACT | Status: DC | PRN
  Filled 2021-01-03: qty 8.8

## 2021-01-03 MED ORDER — MENTHOL 3 MG MT LOZG: 1.0000 | LOZENGE | OROMUCOSAL | Status: DC | PRN

## 2021-01-03 MED ORDER — FENTANYL CITRATE (PF) 100 MCG/2ML IJ SOLN
50.0000 ug | INTRAMUSCULAR | Status: DC
Start: 2021-01-03 — End: 2021-01-03
  Administered 2021-01-03: 25 ug via INTRAVENOUS
  Administered 2021-01-03: 50 ug via INTRAVENOUS
  Administered 2021-01-03: 25 ug via INTRAVENOUS
  Filled 2021-01-03: qty 2

## 2021-01-03 MED ORDER — TRANEXAMIC ACID-NACL 1000-0.7 MG/100ML-% IV SOLN
1000.0000 mg | INTRAVENOUS | Status: AC
  Administered 2021-01-03: 1000 mg via INTRAVENOUS
  Filled 2021-01-03: qty 100

## 2021-01-03 MED ORDER — MONTELUKAST SODIUM 10 MG PO TABS
10.0000 mg | ORAL_TABLET | Freq: Every day | ORAL | Status: DC
  Administered 2021-01-03: 10 mg via ORAL
  Filled 2021-01-03: qty 1

## 2021-01-03 MED ORDER — MIDAZOLAM HCL 2 MG/2ML IJ SOLN
INTRAMUSCULAR | Status: AC
  Filled 2021-01-03: qty 2

## 2021-01-03 MED ORDER — ATENOLOL 25 MG PO TABS
25.0000 mg | ORAL_TABLET | Freq: Every day | ORAL | Status: DC
  Administered 2021-01-04: 25 mg via ORAL
  Filled 2021-01-03: qty 1

## 2021-01-03 MED ORDER — PHENYLEPHRINE HCL-NACL 10-0.9 MG/250ML-% IV SOLN
INTRAVENOUS | Status: DC | PRN
  Administered 2021-01-03: 20 ug/min via INTRAVENOUS

## 2021-01-03 MED ORDER — POLYETHYLENE GLYCOL 3350 17 G PO PACK: 17.0000 g | PACK | Freq: Every day | ORAL | Status: DC | PRN

## 2021-01-03 MED ORDER — SODIUM CHLORIDE 0.9 % IV SOLN: INTRAVENOUS | Status: DC

## 2021-01-03 MED ORDER — ONDANSETRON HCL 4 MG/2ML IJ SOLN
INTRAMUSCULAR | Status: DC | PRN
  Administered 2021-01-03: 4 mg via INTRAVENOUS

## 2021-01-03 MED ORDER — PROPOFOL 500 MG/50ML IV EMUL
INTRAVENOUS | Status: DC | PRN
  Administered 2021-01-03: 30 ug/kg/min via INTRAVENOUS

## 2021-01-03 MED ORDER — ONDANSETRON HCL 4 MG/2ML IJ SOLN
INTRAMUSCULAR | Status: AC
  Filled 2021-01-03: qty 2

## 2021-01-03 MED ORDER — METHOCARBAMOL 1000 MG/10ML IJ SOLN
500.0000 mg | Freq: Four times a day (QID) | INTRAVENOUS | Status: DC | PRN
  Filled 2021-01-03: qty 5

## 2021-01-03 MED ORDER — METOCLOPRAMIDE HCL 5 MG PO TABS: 5.0000 mg | ORAL_TABLET | Freq: Three times a day (TID) | ORAL | Status: DC | PRN

## 2021-01-03 MED ORDER — AMLODIPINE BESYLATE 5 MG PO TABS
5.0000 mg | ORAL_TABLET | Freq: Every day | ORAL | Status: DC
  Administered 2021-01-04: 5 mg via ORAL
  Filled 2021-01-03: qty 1

## 2021-01-03 MED ORDER — DOCUSATE SODIUM 100 MG PO CAPS
100.0000 mg | ORAL_CAPSULE | Freq: Two times a day (BID) | ORAL | Status: DC
  Administered 2021-01-03 – 2021-01-04 (×2): 100 mg via ORAL
  Filled 2021-01-03 (×2): qty 1

## 2021-01-03 MED ORDER — ONDANSETRON HCL 4 MG/2ML IJ SOLN: 4.0000 mg | Freq: Once | INTRAMUSCULAR | Status: DC | PRN

## 2021-01-03 MED ORDER — BUPIVACAINE HCL 0.25 % IJ SOLN
INTRAMUSCULAR | Status: AC
  Filled 2021-01-03: qty 1

## 2021-01-03 MED ORDER — PROPOFOL 1000 MG/100ML IV EMUL
INTRAVENOUS | Status: AC
  Filled 2021-01-03: qty 100

## 2021-01-03 MED ORDER — EZETIMIBE 10 MG PO TABS: 10.0000 mg | ORAL_TABLET | Freq: Every day | ORAL | Status: DC

## 2021-01-03 MED ORDER — ICOSAPENT ETHYL 1 G PO CAPS
2.0000 g | ORAL_CAPSULE | Freq: Two times a day (BID) | ORAL | Status: DC
  Administered 2021-01-03 – 2021-01-04 (×2): 2 g via ORAL
  Filled 2021-01-03 (×2): qty 2

## 2021-01-03 MED ORDER — OXYCODONE HCL 5 MG/5ML PO SOLN: 5.0000 mg | Freq: Once | ORAL | Status: DC | PRN

## 2021-01-03 MED ORDER — CHLORHEXIDINE GLUCONATE 0.12 % MT SOLN: 15.0000 mL | Freq: Once | OROMUCOSAL | Status: AC

## 2021-01-03 MED ORDER — 0.9 % SODIUM CHLORIDE (POUR BTL) OPTIME
TOPICAL | Status: DC | PRN
  Administered 2021-01-03: 1000 mL

## 2021-01-03 MED ORDER — STERILE WATER FOR IRRIGATION IR SOLN
Status: DC | PRN
  Administered 2021-01-03: 2000 mL

## 2021-01-03 MED ORDER — OXYCODONE HCL 5 MG PO TABS
5.0000 mg | ORAL_TABLET | ORAL | Status: DC | PRN
  Administered 2021-01-03: 10 mg via ORAL
  Filled 2021-01-03: qty 2
  Filled 2021-01-03: qty 1

## 2021-01-03 MED ORDER — FENTANYL CITRATE (PF) 100 MCG/2ML IJ SOLN
INTRAMUSCULAR | Status: AC
  Filled 2021-01-03: qty 2

## 2021-01-03 MED ORDER — SODIUM CHLORIDE 0.9 % IR SOLN
Status: DC | PRN
  Administered 2021-01-03: 1000 mL

## 2021-01-03 MED ORDER — BUPIVACAINE HCL (PF) 0.25 % IJ SOLN
INTRAMUSCULAR | Status: DC | PRN
  Administered 2021-01-03: 30 mL

## 2021-01-03 MED ORDER — ONDANSETRON HCL 4 MG/2ML IJ SOLN: 4.0000 mg | Freq: Four times a day (QID) | INTRAMUSCULAR | Status: DC | PRN

## 2021-01-03 MED ORDER — MIDAZOLAM HCL 2 MG/2ML IJ SOLN
1.0000 mg | INTRAMUSCULAR | Status: DC
Start: 2021-01-03 — End: 2021-01-03
  Filled 2021-01-03: qty 2

## 2021-01-03 MED ORDER — ALUM & MAG HYDROXIDE-SIMETH 200-200-20 MG/5ML PO SUSP: 30.0000 mL | ORAL | Status: DC | PRN

## 2021-01-03 MED ORDER — DEXAMETHASONE SODIUM PHOSPHATE 10 MG/ML IJ SOLN
INTRAMUSCULAR | Status: DC | PRN
  Administered 2021-01-03: 8 mg via INTRAVENOUS

## 2021-01-03 MED ORDER — PHENYLEPHRINE HCL (PRESSORS) 10 MG/ML IV SOLN
INTRAVENOUS | Status: AC
  Filled 2021-01-03: qty 1

## 2021-01-03 MED ORDER — CEFAZOLIN IN SODIUM CHLORIDE 3-0.9 GM/100ML-% IV SOLN
3.0000 g | INTRAVENOUS | Status: AC
  Administered 2021-01-03: 3 g via INTRAVENOUS
  Filled 2021-01-03: qty 100

## 2021-01-03 MED ORDER — POVIDONE-IODINE 10 % EX SWAB
2.0000 "application " | Freq: Once | CUTANEOUS | Status: AC
  Administered 2021-01-03: 2 via TOPICAL

## 2021-01-03 SURGICAL SUPPLY — 59 items
APL SKNCLS STERI-STRIP NONHPOA (GAUZE/BANDAGES/DRESSINGS)
BAG SPEC THK2 15X12 ZIP CLS (MISCELLANEOUS) ×1
BAG ZIPLOCK 12X15 (MISCELLANEOUS) ×2 IMPLANT
BEARING TRIATHLON TIBIAL 6X11 (Orthopedic Implant) ×1 IMPLANT
BENZOIN TINCTURE PRP APPL 2/3 (GAUZE/BANDAGES/DRESSINGS) IMPLANT
BLADE SAG 18X100X1.27 (BLADE) ×2 IMPLANT
BLADE SURG SZ10 CARB STEEL (BLADE) ×4 IMPLANT
BNDG ELASTIC 4X5.8 VLCR STR LF (GAUZE/BANDAGES/DRESSINGS) ×1 IMPLANT
BNDG ELASTIC 6X5.8 VLCR STR LF (GAUZE/BANDAGES/DRESSINGS) ×3 IMPLANT
BOWL SMART MIX CTS (DISPOSABLE) IMPLANT
BSPLAT TIB 6 KN TRITANIUM (Knees) ×1 IMPLANT
COOLER ICEMAN CLASSIC (MISCELLANEOUS) ×2 IMPLANT
COVER SURGICAL LIGHT HANDLE (MISCELLANEOUS) ×2 IMPLANT
COVER WAND RF STERILE (DRAPES) IMPLANT
CUFF TOURN SGL QUICK 34 (TOURNIQUET CUFF) ×2
CUFF TRNQT CYL 34X4.125X (TOURNIQUET CUFF) ×1 IMPLANT
DECANTER SPIKE VIAL GLASS SM (MISCELLANEOUS) IMPLANT
DRAPE U-SHAPE 47X51 STRL (DRAPES) ×2 IMPLANT
DRSG PAD ABDOMINAL 8X10 ST (GAUZE/BANDAGES/DRESSINGS) ×3 IMPLANT
DURAPREP 26ML APPLICATOR (WOUND CARE) ×2 IMPLANT
ELECT BLADE TIP CTD 4 INCH (ELECTRODE) ×2 IMPLANT
ELECT REM PT RETURN 15FT ADLT (MISCELLANEOUS) ×2 IMPLANT
FEMORAL POSTERIOR SZ6 RT (Femur) IMPLANT
GAUZE SPONGE 4X4 12PLY STRL (GAUZE/BANDAGES/DRESSINGS) ×2 IMPLANT
GAUZE XEROFORM 1X8 LF (GAUZE/BANDAGES/DRESSINGS) ×1 IMPLANT
GLOVE SRG 8 PF TXTR STRL LF DI (GLOVE) ×2 IMPLANT
GLOVE SURG ENC MOIS LTX SZ7.5 (GLOVE) ×2 IMPLANT
GLOVE SURG LTX SZ8 (GLOVE) ×2 IMPLANT
GLOVE SURG UNDER POLY LF SZ8 (GLOVE) ×4
GOWN STRL REUS W/TWL XL LVL3 (GOWN DISPOSABLE) ×4 IMPLANT
HANDPIECE INTERPULSE COAX TIP (DISPOSABLE) ×2
HOLDER FOLEY CATH W/STRAP (MISCELLANEOUS) IMPLANT
IMMOBILIZER KNEE 20 (SOFTGOODS) ×2
IMMOBILIZER KNEE 20 THIGH 36 (SOFTGOODS) ×1 IMPLANT
KIT TURNOVER KIT A (KITS) ×2 IMPLANT
KNEE PATELLA ASYMMETRIC 10X32 (Knees) ×1 IMPLANT
KNEE TIBIAL COMPONENT SZ6 (Knees) ×1 IMPLANT
NS IRRIG 1000ML POUR BTL (IV SOLUTION) ×2 IMPLANT
PACK TOTAL KNEE CUSTOM (KITS) ×2 IMPLANT
PAD COLD SHLDR WRAP-ON (PAD) ×2 IMPLANT
PADDING CAST COTTON 6X4 STRL (CAST SUPPLIES) ×4 IMPLANT
PADDING CAST SYN 6 (CAST SUPPLIES) ×1
PADDING CAST SYNTHETIC 6X4 NS (CAST SUPPLIES) IMPLANT
PENCIL SMOKE EVACUATOR (MISCELLANEOUS) IMPLANT
PIN FLUTED HEDLESS FIX 3.5X1/8 (PIN) ×1 IMPLANT
POSTERIOR FEMORAL SZ6 RT (Femur) ×2 IMPLANT
PROTECTOR NERVE ULNAR (MISCELLANEOUS) ×2 IMPLANT
SET HNDPC FAN SPRY TIP SCT (DISPOSABLE) ×1 IMPLANT
SET PAD KNEE POSITIONER (MISCELLANEOUS) ×2 IMPLANT
STAPLER VISISTAT 35W (STAPLE) IMPLANT
STRIP CLOSURE SKIN 1/2X4 (GAUZE/BANDAGES/DRESSINGS) IMPLANT
SUT MNCRL AB 4-0 PS2 18 (SUTURE) IMPLANT
SUT VIC AB 0 CT1 27 (SUTURE) ×2
SUT VIC AB 0 CT1 27XBRD ANTBC (SUTURE) ×1 IMPLANT
SUT VIC AB 1 CT1 36 (SUTURE) ×4 IMPLANT
SUT VIC AB 2-0 CT1 27 (SUTURE) ×4
SUT VIC AB 2-0 CT1 TAPERPNT 27 (SUTURE) ×2 IMPLANT
TRAY FOLEY MTR SLVR 16FR STAT (SET/KITS/TRAYS/PACK) IMPLANT
WATER STERILE IRR 1000ML POUR (IV SOLUTION) ×4 IMPLANT

## 2021-01-03 NOTE — Brief Op Note (Signed)
01/03/2021  2:42 PM  PATIENT:  [name] [name]  75 y.o. male  PRE-OPERATIVE DIAGNOSIS:  endstage osteoarthritis right knee  POST-OPERATIVE DIAGNOSIS:  endstage osteoarthritis right knee  PROCEDURE:  Procedure(s) with comments: RIGHT TOTAL KNEE ARTHROPLASTY (Right) - Needs RNFA  SURGEON:  Surgeon(s) and Role:    Mcarthur Rossetti, MD - Primary  ANESTHESIA:   local, regional and spinal  EBL:  25 mL   COUNTS:  YES  TOURNIQUET:   Total Tourniquet Time Documented: Thigh (Right) - 46 minutes Total: Thigh (Right) - 46 minutes   DICTATION: .Other Dictation: Dictation Number [id number]  PLAN OF CARE: Admit for overnight observation  PATIENT DISPOSITION:  PACU - hemodynamically stable.   Delay start of Pharmacological VTE agent (>24hrs) due to surgical blood loss or risk of bleeding: no

## 2021-01-03 NOTE — Anesthesia Postprocedure Evaluation (Signed)
Anesthesia Post Note  Patient: [name] [name]  Procedure(s) Performed: RIGHT TOTAL KNEE ARTHROPLASTY (Right Knee)     Patient location during evaluation: PACU Anesthesia Type: Spinal Level of consciousness: awake and alert Pain management: pain level controlled Vital Signs Assessment: post-procedure vital signs reviewed and stable Respiratory status: spontaneous breathing, nonlabored ventilation and respiratory function stable Cardiovascular status: blood pressure returned to baseline and stable Postop Assessment: no apparent nausea or vomiting Anesthetic complications: no   No complications documented.  Last Vitals:  Vitals:   01/03/21 1209 01/03/21 1500  BP: 134/67 (!) 104/52  Pulse: (!) 58 (!) 57  Resp: 14 10  Temp:  36.8 C  SpO2: 98% 97%    Last Pain:  Vitals:   01/03/21 1500  TempSrc:   PainSc: 0-No pain                 Lynda Rainwater

## 2021-01-03 NOTE — Anesthesia Procedure Notes (Signed)
Procedure Name: MAC Date/Time: 01/03/2021 1:07 PM Performed by: Eben Burow, CRNA Pre-anesthesia Checklist: Patient identified, Emergency Drugs available, Suction available, Patient being monitored and Timeout performed Oxygen Delivery Method: Simple face mask Placement Confirmation: positive ETCO2

## 2021-01-03 NOTE — Anesthesia Preprocedure Evaluation (Addendum)
Anesthesia Evaluation  Patient identified by MRN, date of birth, ID band Patient awake    Reviewed: Allergy & Precautions, NPO status , Patient's Chart, lab work & pertinent test results, reviewed documented beta blocker date and time   History of Anesthesia Complications Negative for: history of anesthetic complications  Airway Mallampati: III  TM Distance: >3 FB Neck ROM: Full    Dental  (+) Dental Advisory Given, Teeth Intact   Pulmonary asthma , COPD,  COPD inhaler, former smoker,    Pulmonary exam normal        Cardiovascular hypertension, Pt. on medications and Pt. on home beta blockers Normal cardiovascular exam   '22 Myoperfusion - The left ventricular ejection fraction is mildly decreased (45-54%). Nuclear stress EF: 50%. Mild global hypokinesis. There was no ST segment deviation noted during stress. This is a low risk study. There is no evidence of old infarct or ischemia pattern. Apical thinning noted, normal variant. Chamber size mildly enlarged.      Neuro/Psych PSYCHIATRIC DISORDERS Depression  Neuromuscular disease (neuropathy)    GI/Hepatic Neg liver ROS, GERD  Controlled,  Endo/Other   Obesity   Renal/GU negative Renal ROS    Prostate cancer     Musculoskeletal  (+) Arthritis ,   Abdominal   Peds  Hematology negative hematology ROS (+)  Plt 217k    Anesthesia Other Findings Covid test negative   Reproductive/Obstetrics                           Anesthesia Physical Anesthesia Plan  ASA: III  Anesthesia Plan: Spinal   Post-op Pain Management:  Regional for Post-op pain   Induction:   PONV Risk Score and Plan: 1 and Treatment may vary due to age or medical condition and Propofol infusion  Airway Management Planned: Natural Airway and Simple Face Mask  Additional Equipment: None  Intra-op Plan:   Post-operative Plan:   Informed Consent: I have  reviewed the patients History and Physical, chart, labs and discussed the procedure including the risks, benefits and alternatives for the proposed anesthesia with the patient or authorized representative who has indicated his/her understanding and acceptance.       Plan Discussed with: CRNA and Anesthesiologist  Anesthesia Plan Comments: (Labs reviewed, platelets acceptable. Discussed risks and benefits of spinal, including spinal/epidural hematoma, infection, failed block, and PDPH. Patient expressed understanding and wished to proceed. )       Anesthesia Quick Evaluation

## 2021-01-03 NOTE — Interval H&P Note (Signed)
History and Physical Interval Note: The patient understands that he is here today for a right total knee replacement to treat his right knee osteoarthritis.  There has been no acute or interval changes in medical status.  See recent H&P.  The risks and benefits of surgery been explained in detail and informed consent is obtained.  The right knee has been marked.  01/03/2021 11:46 AM  [name] [name]  has presented today for surgery, with the diagnosis of endstage osteoarthritis right knee.  The various methods of treatment have been discussed with the patient and family. After consideration of risks, benefits and other options for treatment, the patient has consented to  Procedure(s) with comments: RIGHT TOTAL KNEE ARTHROPLASTY (Right) - Needs RNFA as a surgical intervention.  The patient's history has been reviewed, patient examined, no change in status, stable for surgery.  I have reviewed the patient's chart and labs.  Questions were answered to the patient's satisfaction.     Mcarthur Rossetti

## 2021-01-03 NOTE — Transfer of Care (Signed)
Immediate Anesthesia Transfer of Care Note  Patient: [name] [name]  Procedure(s) Performed: RIGHT TOTAL KNEE ARTHROPLASTY (Right Knee)  Patient Location: PACU  Anesthesia Type:Spinal  Level of Consciousness: awake  Airway & Oxygen Therapy: Patient Spontanous Breathing and Patient connected to face mask oxygen  Post-op Assessment: Report given to RN and Post -op Vital signs reviewed and stable  Post vital signs: Reviewed and stable  Last Vitals:  Vitals Value Taken Time  BP 104/52 01/03/21 1500  Temp    Pulse 64 01/03/21 1502  Resp 16 01/03/21 1502  SpO2 100 % 01/03/21 1502  Vitals shown include unvalidated device data.  Last Pain:  Vitals:   01/03/21 1035  TempSrc: Oral         Complications: No complications documented.

## 2021-01-03 NOTE — Anesthesia Procedure Notes (Signed)
Anesthesia Regional Block: Adductor canal block   Pre-Anesthetic Checklist: ,, timeout performed, Correct Patient, Correct Site, Correct Laterality, Correct Procedure, Correct Position, site marked, Risks and benefits discussed,  Surgical consent,  Pre-op evaluation,  At surgeon's request and post-op pain management  Laterality: Right  Prep: chloraprep       Needles:  Injection technique: Single-shot  Needle Type: Echogenic Needle     Needle Length: 10cm  Needle Gauge: 21     Additional Needles:   Narrative:  Start time: 01/03/2021 12:05 PM End time: 01/03/2021 12:08 PM Injection made incrementally with aspirations every 5 mL.  Performed by: Personally  Anesthesiologist: Audry Pili, MD  Additional Notes: No pain on injection. No increased resistance to injection. Injection made in 5cc increments. Good needle visualization. Patient tolerated the procedure well.

## 2021-01-03 NOTE — Anesthesia Procedure Notes (Signed)
Spinal  Patient location during procedure: OR Start time: 01/03/2021 1:12 PM Reason for block: surgical anesthesia Staffing Performed: resident/CRNA  Anesthesiologist: Audry Pili, MD Resident/CRNA: Eben Burow, CRNA Preanesthetic Checklist Completed: patient identified, IV checked, site marked, risks and benefits discussed, surgical consent, monitors and equipment checked, pre-op evaluation and timeout performed Spinal Block Patient position: sitting Prep: DuraPrep and site prepped and draped Patient monitoring: heart rate, cardiac monitor, continuous pulse ox and blood pressure Approach: midline Location: L3-4 Injection technique: single-shot Needle Needle type: Pencan  Needle gauge: 24 G Needle length: 9 cm Assessment Sensory level: T4 Events: CSF return Additional Notes Pt placed in sitting position, spinal kit expiration date checked and verified, + CSF, - heme, pt tolerated well. Dr Fransisco Beau present and supervising throughout SAB placement.

## 2021-01-03 NOTE — Care Plan (Signed)
Ortho Bundle Case Management Note  Patient Details  Name: [name] [name] MRN: [id number] Date of Birth: Aug 19, 1946   Ortho bundle RNCM call to patient to discuss his upcoming Right total knee replacement with Dr. Ninfa Linden on Friday, 01/03/21. He is an Ortho bundle patient through [facility]. Reviewed information regarding this and he is agreeable to Case management. He has a spouse and son that will be assisting at home after discharge. He has all DME needed (FWW, elevated toilet seat in the home). Anticipate HHPT will be needed after short hospital stay. Referral made to [facility] ([facility]) after choice provided. Reviewed all post-op care instructions and these were mailed to patient to his home. Will continue to follow for needs.               DME Arranged:   (Patient has a FWW already and elevated toilets in the home) DME Agency:     [place]:  PT [place]:  [facility]  Additional Comments: Please contact me with any questions of if this plan should need to change.  Jamse Arn, RN, BSN, [employer]  [phone] 01/03/2021, 10:25 AM

## 2021-01-03 NOTE — Progress Notes (Signed)
Assisted Dr. Brock with right, ultrasound guided, adductor canal block. Side rails up, monitors on throughout procedure. See vital signs in flow sheet. Tolerated Procedure well.  

## 2021-01-04 DIAGNOSIS — I10 Essential (primary) hypertension: Secondary | ICD-10-CM | POA: Diagnosis not present

## 2021-01-04 DIAGNOSIS — Z87891 Personal history of nicotine dependence: Secondary | ICD-10-CM | POA: Diagnosis not present

## 2021-01-04 DIAGNOSIS — J45909 Unspecified asthma, uncomplicated: Secondary | ICD-10-CM | POA: Diagnosis not present

## 2021-01-04 DIAGNOSIS — M1711 Unilateral primary osteoarthritis, right knee: Secondary | ICD-10-CM | POA: Diagnosis not present

## 2021-01-04 DIAGNOSIS — M1712 Unilateral primary osteoarthritis, left knee: Secondary | ICD-10-CM | POA: Diagnosis not present

## 2021-01-04 DIAGNOSIS — Z79899 Other long term (current) drug therapy: Secondary | ICD-10-CM | POA: Diagnosis not present

## 2021-01-04 LAB — BASIC METABOLIC PANEL
Anion gap: 10 (ref 5–15)
BUN: 22 mg/dL (ref 8–23)
CO2: 26 mmol/L (ref 22–32)
Calcium: 8.9 mg/dL (ref 8.9–10.3)
Chloride: 96 mmol/L — ABNORMAL LOW (ref 98–111)
Creatinine, Ser: 1.53 mg/dL — ABNORMAL HIGH (ref 0.61–1.24)
GFR, Estimated: 47 mL/min — ABNORMAL LOW (ref >60)
Glucose, Bld: 177 mg/dL — ABNORMAL HIGH (ref 70–99)
Potassium: 4.8 mmol/L (ref 3.5–5.1)
Sodium: 132 mmol/L — ABNORMAL LOW (ref 135–145)

## 2021-01-04 LAB — CBC
HCT: 39.3 % (ref 39.0–52.0)
Hemoglobin: 12.7 g/dL — ABNORMAL LOW (ref 13.0–17.0)
MCH: 31.7 pg (ref 26.0–34.0)
MCHC: 32.3 g/dL (ref 30.0–36.0)
MCV: 98 fL (ref 80.0–100.0)
Platelets: 198 10*3/uL (ref 150–400)
RBC: 4.01 MIL/uL — ABNORMAL LOW (ref 4.22–5.81)
RDW: 13 % (ref 11.5–15.5)
WBC: 14.6 10*3/uL — ABNORMAL HIGH (ref 4.0–10.5)
nRBC: 0 % (ref 0.0–0.2)

## 2021-01-04 MED ORDER — ASPIRIN 81 MG PO CHEW: 81.0000 mg | CHEWABLE_TABLET | Freq: Two times a day (BID) | ORAL | 0 refills | Status: DC

## 2021-01-04 MED ORDER — OXYCODONE HCL 5 MG PO TABS: 5.0000 mg | ORAL_TABLET | Freq: Four times a day (QID) | ORAL | 0 refills | Status: DC | PRN

## 2021-01-04 MED ORDER — METHOCARBAMOL 500 MG PO TABS: 500.0000 mg | ORAL_TABLET | Freq: Four times a day (QID) | ORAL | 1 refills | Status: DC | PRN

## 2021-01-04 NOTE — Evaluation (Signed)
Physical Therapy Evaluation Patient Details Name: [name] [name] MRN: [id number] DOB: 1946-02-10 Today's Date: 01/04/2021   History of Present Illness  75 yo male s/p R TKA 01/03/21  Clinical Impression  On eval, pt was Min guard A for mobility. He walked ~125 feet with a RW. Min-Mod pain with activity. Will plan to have a 2nd session prior to d/c home later today.     Follow Up Recommendations Follow surgeon's recommendation for DC plan and follow-up therapies (HHPT)    Equipment Recommendations  None recommended by PT    Recommendations for Other Services       Precautions / Restrictions Precautions Precautions: Fall;Knee Restrictions Weight Bearing Restrictions: No Other Position/Activity Restrictions: WBAT      Mobility  Bed Mobility Overal bed mobility: Needs Assistance Bed Mobility: Supine to Sit     Supine to sit: Supervision;HOB elevated     General bed mobility comments: for safety.    Transfers Overall transfer level: Needs assistance Equipment used: Rolling walker (2 wheeled) Transfers: Sit to/from Stand Sit to Stand: Supervision;From elevated surface         General transfer comment: Cues for safety, technique,hand/LE placement.  Ambulation/Gait Ambulation/Gait assistance: Min guard Gait Distance (Feet): 125 Feet Assistive device: Rolling walker (2 wheeled) Gait Pattern/deviations: Step-to pattern;Step-through pattern;Decreased stride length     General Gait Details: Cues for safety, pacing, technique, sequence. Min guard for safety.  Stairs            Wheelchair Mobility    Modified Rankin (Stroke Patients Only)       Balance Overall balance assessment: Needs assistance         Standing balance support: Bilateral upper extremity supported Standing balance-Leahy Scale: Poor                               Pertinent Vitals/Pain Pain Assessment: 0-10 Pain Score: 5  Pain Location: R knee Pain Descriptors /  Indicators: Discomfort;Sore Pain Intervention(s): Limited activity within patient's tolerance;Monitored during session;Ice applied    Home Living Family/patient expects to be discharged to:: Private residence Living Arrangements: Spouse/significant other Available Help at Discharge: Family Type of Home: House Home Access: Stairs to enter Entrance Stairs-Rails: None Entrance Stairs-Number of Steps: 2 Home Layout: One level Home Equipment: Grab bars - toilet;Walker - 2 wheels;Cane - single point;Toilet riser      Prior Function Level of Independence: Independent               Hand Dominance        Extremity/Trunk Assessment   Upper Extremity Assessment Upper Extremity Assessment: Overall WFL for tasks assessed    Lower Extremity Assessment Lower Extremity Assessment:  (post weakness 2* TKA)    Cervical / Trunk Assessment Cervical / Trunk Assessment: Normal  Communication   Communication: No difficulties  Cognition Arousal/Alertness: Awake/alert Behavior During Therapy: WFL for tasks assessed/performed Overall Cognitive Status: Within Functional Limits for tasks assessed                                        General Comments      Exercises Total Joint Exercises Ankle Circles/Pumps: AROM;Both;10 reps Quad Sets: AROM;Both;10 reps Hip ABduction/ADduction: AROM;Right;10 reps Straight Leg Raises: AROM;Right;10 reps Long Arc Quad: AROM;Right;Both;Seated Knee Flexion: AROM;Right;10 reps;Seated Goniometric ROM: ~10-75 degrees   Assessment/Plan    PT Assessment  Patient needs continued PT services  PT Problem List Decreased strength;Decreased mobility;Decreased range of motion;Decreased activity tolerance;Decreased balance;Decreased knowledge of use of DME;Pain       PT Treatment Interventions DME instruction;Gait training;Therapeutic exercise;Balance training;Stair training;Functional mobility training;Therapeutic activities;Patient/family  education    PT Goals (Current goals can be found in the Care Plan section)  Acute Rehab PT Goals Patient Stated Goal: regain independence PT Goal Formulation: With patient Time For Goal Achievement: 01/18/21 Potential to Achieve Goals: Good    Frequency 7X/week   Barriers to discharge        Co-evaluation               AM-PAC PT "6 Clicks" Mobility  Outcome Measure Help needed turning from your back to your side while in a flat bed without using bedrails?: None Help needed moving from lying on your back to sitting on the side of a flat bed without using bedrails?: None Help needed moving to and from a bed to a chair (including a wheelchair)?: A Little Help needed standing up from a chair using your arms (e.g., wheelchair or bedside chair)?: A Little Help needed to walk in hospital room?: A Little Help needed climbing 3-5 steps with a railing? : A Little 6 Click Score: 20    End of Session Equipment Utilized During Treatment: Gait belt Activity Tolerance: Patient tolerated treatment well Patient left: in chair;with call bell/phone within reach   PT Visit Diagnosis: Pain Pain - Right/Left: Right Pain - part of body: Knee    Time: 2751-7001 PT Time Calculation (min) (ACUTE ONLY): 20 min   Charges:   PT Evaluation $PT Eval Low Complexity: [place], PT Acute Rehabilitation  Office: [phone] Pager: [phone]

## 2021-01-04 NOTE — Op Note (Signed)
[name] [name], [name] MEDICAL RECORD NO: [id number] ACCOUNT NO: [id number] DATE OF BIRTH: 1946/06/25 FACILITY: [facility]: [facility] PHYSICIAN: Lind Guest. Ninfa Linden, MD  Operative Report   DATE OF PROCEDURE: 01/03/2021  PREOPERATIVE DIAGNOSIS:  Primary osteoarthritis and degenerative joint disease, right knee.  POSTOPERATIVE DIAGNOSIS:  Primary osteoarthritis and degenerative joint disease, right knee.  PROCEDURE:  Right total knee arthroplasty.  IMPLANTS:  Stryker Triathlon press fit knee system with size 6 femur, size 6 tibial tray, 11 mm fixed bearing polyethylene insert, size 32 patellar button.  SURGEON:  Jean Rosenthal, MD.  ANESTHESIA:  1.  Right lower extremity adductor canal block. 2.  Local with 0.25% plain Marcaine around the arthrotomy. 3.  Spinal.  TOURNIQUET TIME:  Less than 1 hour.  ANTIBIOTICS:  3 grams IV Ancef.  BLOOD LOSS:  Less than 100 mL.   COMPLICATIONS:  None.  INDICATIONS:  The patient is a 75 year old gentleman well known to me.  He has debilitating arthritis involving both his knees.  He has tried and failed all forms of conservative treatment and at this point wishes to proceed with a right total knee  arthroplasty.  We agree with this as well.  His pain is daily and is detrimentally affecting his mobility, his quality of life and his activities of daily living to the point he does wish to proceed with the surgery.  We talked about the risk of acute  blood loss anemia, nerve or vessel injury, fracture, infection, DVT, and implant failure.  We talked about our goals being decreased pain, improve mobility and overall improve quality of life.  DESCRIPTION OF PROCEDURE:  After informed consent was obtained, appropriate right knee was marked.  An adductor canal block was obtained in the right lower extremity in the holding room.  He was brought to the operating room and sat up on the operating  table.  Spinal anesthesia was obtained, he was laid in  supine position on the operating table.  Foley catheter was placed and a nonsterile tourniquet was placed around his upper right thigh.  His right thigh, knee, leg, ankle, foot were prepped and  draped with DuraPrep and sterile drapes including a sterile stockinette.  A timeout was called.  He was identified correct patient, correct right knee.  We then used Esmarch to wrap that leg and tourniquet was inflated to 300 mm of pressure.  I then made  a direct midline incision over the patella and carried this proximally and then distally.  I dissected down to the knee joint, carried out a medial parapatellar arthrotomy, finding a large joint effusion and significant periarticular osteophytes  throughout the knee.  We found a complete loss of cartilage on the medial aspect of the knee in varus malalignment.  With the knee in a flexed position, we removed all osteophytes from the knee as well as remnants of the medial and lateral meniscus and  PCL and ACL.  We then set our extramedullary cutting guide for making our proximal tibia cut correcting for varus and valgus and neutral slope.  We made our cut to take 9 mm off the high side.  We made this without difficulty.  We then went to the femur  and using intramedullary guide for setting our distal femoral cutting jig for a right knee at 5 degrees externally rotated and a 10 mm distal femoral cut.  We made this cut without difficulty, and brought the knee back down to full extension including  debris from the back of  the knee and then placed a 9 mm extension block and we achieved full extension.  We then went back to the femur and put our femoral sizing guide based off the epicondylar axis.  Based off of this, we chose a size 6 femur.  We put  a 4-in-1 cutting block for a size 6 femur, we made our anterior and posterior cuts, followed by our chamfer cuts.  We then made our femoral box cut.  Attention was then turned back to the tibia, we chose a size 6 tibial tray  for coverage and set the  alignment of the tibial tubercle and the femur, we did our keel punch off of this.  He had nice and good solid bone.  We did this all for press-fit implant, with a size 6 trial tibia, we trialed a size 6 right femur and a 9 mm and then 11 mm fixed  bearing trial insert and we were pleased with the range of motion and stability with 11 mm insert.  We then made our patellar cut and drilled three holes for a size 32 press-fit patellar button.  We then removed all instrumentation from the knee and  irrigated the knee with normal saline solution using pulsatile lavage.  We dried the knee real well and placed our Marcaine around the arthrotomy.  With the knee in a flexed position we dried it well again and did our final tibial preparation and then  placed our real Stryker press-fit tibial tray, size 6 followed by a real size 6 right femur.  We placed our real 11 mm fixed bearing polyethylene insert and press fit our size 32 patellar button.  I then put the knee through several cycles of range of  motion.  I was pleased with range of motion and stability of the knee.  We then let the tourniquet down and hemostasis obtained with electrocautery.  We closed the arthrotomy with interrupted #1 Vicryl suture followed by 0 Vicryl to close the deep tissue  and 2-0 Vicryl to close the subcutaneous tissue.  The skin was reapproximated with staples.  A well-padded sterile dressing was applied.  He was taken to recovery room in stable condition with all final counts being correct and no complications noted.   SUJ D: 01/03/2021 2:41:37 pm T: 01/04/2021 3:48:00 am  JOB: [id number]/ [id number]

## 2021-01-04 NOTE — TOC Progression Note (Signed)
Transition of Care [facility]) - Progression Note    Patient Details  Name: OLUWATOMISIN DEMAN MRN: [id number] Date of Birth: [birth year]  Transition of Care [facility]) CM/SW Contact  Joaquin Courts, RN Phone Number: 01/04/2021, 11:34 AM  Clinical Narrative:    Patient plans to discharge home with HHPT, [facility] to provide service, patient reports has RW and declines 3in1.   Expected Discharge Plan: [place] Barriers to Discharge: No Barriers Identified  Expected Discharge Plan and Services Expected Discharge Plan: [place]   Discharge Planning Services: CM Consult Post Acute Care Choice: [place] arrangements for the past 2 months: Single Family Home Expected Discharge Date: 01/04/21               DME Arranged: N/A DME Agency: NA       HH Arranged: PT [place] Agency: [facility]now [facility])         Social Determinants of Health (SDOH) Interventions    Readmission Risk Interventions No flowsheet data found.

## 2021-01-04 NOTE — Progress Notes (Signed)
Physical Therapy Treatment Patient Details Name: [name] [name] MRN: [id number] DOB: [birth year] Today's Date: 01/04/2021    History of Present Illness 75 yo male s/p R TKA 01/03/21    PT Comments    Progressing well with mobility. Reviewed/practiced gait and stair training. Wife present to observe although son will be the one actually assisting pt into home. All education completed. Okay to d/c from PT standpoint.    Follow Up Recommendations  Follow surgeon's recommendation for DC plan and follow-up therapies (HHPT)     Equipment Recommendations  None recommended by PT    Recommendations for Other Services       Precautions / Restrictions Precautions Precautions: Fall;Knee Restrictions Weight Bearing Restrictions: No Other Position/Activity Restrictions: WBAT    Mobility  Bed Mobility Overal bed mobility: Needs Assistance Bed Mobility: Supine to Sit     Supine to sit: Supervision     General bed mobility comments: for safety.    Transfers Overall transfer level: Needs assistance Equipment used: Rolling walker (2 wheeled) Transfers: Sit to/from Stand Sit to Stand: Supervision         General transfer comment: Cues for safety, technique,hand/LE placement.  Ambulation/Gait Ambulation/Gait assistance: Min guard Gait Distance (Feet): 125 Feet Assistive device: Rolling walker (2 wheeled) Gait Pattern/deviations: Step-to pattern;Step-through pattern;Decreased stride length     General Gait Details: Cues for safety, pacing, technique, sequence. Min guard for safety.   Stairs Stairs: Yes Stairs assistance: Min assist Stair Management: One rail Left;With cane Number of Stairs: 3 General stair comments: up and over portable stairs. cues for safety, technique, sequence. wife present to observe. Pt stated he will have a quad cane and door frame to hold on to. Also son will be assisting him into home.   Wheelchair Mobility    Modified Rankin (Stroke  Patients Only)       Balance Overall balance assessment: Needs assistance         Standing balance support: Bilateral upper extremity supported Standing balance-Leahy Scale: Fair                              Cognition Arousal/Alertness: Awake/alert Behavior During Therapy: WFL for tasks assessed/performed Overall Cognitive Status: Within Functional Limits for tasks assessed                                        Exercises     General Comments        Pertinent Vitals/Pain Pain Assessment: 0-10 Pain Score: 5  Pain Location: R knee Pain Descriptors / Indicators: Discomfort;Sore Pain Intervention(s): Limited activity within patient's tolerance;Monitored during session    Home Living                      Prior Function            PT Goals (current goals can now be found in the care plan section) Acute Rehab PT Goals Patient Stated Goal: regain independence PT Goal Formulation: With patient Time For Goal Achievement: 01/18/21 Potential to Achieve Goals: Good Progress towards PT goals: Progressing toward goals    Frequency    7X/week      PT Plan      Co-evaluation              AM-PAC PT "6 Clicks" Mobility   Outcome Measure  Help needed turning from your back to your side while in a flat bed without using bedrails?: None Help needed moving from lying on your back to sitting on the side of a flat bed without using bedrails?: None Help needed moving to and from a bed to a chair (including a wheelchair)?: A Little Help needed standing up from a chair using your arms (e.g., wheelchair or bedside chair)?: A Little Help needed to walk in hospital room?: A Little Help needed climbing 3-5 steps with a railing? : A Little 6 Click Score: 20    End of Session Equipment Utilized During Treatment: Gait belt Activity Tolerance: Patient tolerated treatment well Patient left: in bed;with call bell/phone within reach;with  family/visitor present (sitting EOB)   PT Visit Diagnosis: Pain;Other abnormalities of gait and mobility (R26.89) Pain - Right/Left: Right Pain - part of body: Knee     Time: 9937-1696 PT Time Calculation (min) (ACUTE ONLY): 8 min  Charges:  $Gait Training: 8-22 mins                         Doreatha Massed, PT Acute Rehabilitation  Office: [phone] Pager: [phone]

## 2021-01-04 NOTE — Progress Notes (Signed)
Subjective: 1 Day Post-Op Procedure(s) (LRB): RIGHT TOTAL KNEE ARTHROPLASTY (Right) Patient reports pain as moderate.    Objective: Vital signs in last 24 hours: Temp:  [97.3 F (36.3 C)-98.4 F (36.9 C)] 98 F (36.7 C) (04/23 0929) Pulse Rate:  [56-74] 60 (04/23 0929) Resp:  [8-22] 22 (04/23 0929) BP: (104-152)/(52-85) 149/62 (04/23 0929) SpO2:  [94 %-100 %] 94 % (04/23 0929)  Intake/Output from previous day: 04/22 0701 - 04/23 0700 In: 2565 [P.O.:240; I.V.:2125; IV Piggyback:200] Out: 1235 [Urine:1210; Blood:25] Intake/Output this shift: Total I/O In: 240 [P.O.:240] Out: 100 [Urine:100]  Recent Labs    01/04/21 0231  HGB 12.7*   Recent Labs    01/04/21 0231  WBC 14.6*  RBC 4.01*  HCT 39.3  PLT 198   Recent Labs    01/04/21 0231  NA 132*  K 4.8  CL 96*  CO2 26  BUN 22  CREATININE 1.53*  GLUCOSE 177*  CALCIUM 8.9   No results for input(s): LABPT, INR in the last 72 hours.  Sensation intact distally Intact pulses distally Dorsiflexion/Plantar flexion intact Incision: dressing C/D/I No cellulitis present Compartment soft   Assessment/Plan: 1 Day Post-Op Procedure(s) (LRB): RIGHT TOTAL KNEE ARTHROPLASTY (Right) Up with therapy Discharge home with home health    Patient's anticipated LOS is less than 2 midnights, meeting these requirements: - Younger than 81 - Lives within 1 hour of care - Has a competent adult at home to recover with post-op recover - NO history of  - Chronic pain requiring opiods  - Diabetes  - Coronary Artery Disease  - Heart failure  - Heart attack  - Stroke  - DVT/VTE  - Cardiac arrhythmia  - Respiratory Failure/COPD  - Renal failure  - Anemia  - Advanced Liver disease       Mcarthur Rossetti 01/04/2021, 11:14 AM

## 2021-01-04 NOTE — Discharge Summary (Signed)
Patient ID: [name] [name] MRN: [id number] DOB/AGE: June 24, 1946 75 y.o.  Admit date: 01/03/2021 Discharge date: 01/04/2021  Admission Diagnoses:  Principal Problem:   Primary osteoarthritis of right knee Active Problems:   Status post total right knee replacement   Discharge Diagnoses:  Same  Past Medical History:  Diagnosis Date  . Asthma, chronic 06/02/2013  . Carpal tunnel syndrome 04/17/2015   Bilateral  . COPD (chronic obstructive pulmonary disease) ([place])   . Depression 06/02/2013  . DYSPNEA ON EXERTION 06/04/2009   Qualifier: Diagnosis of  By: Domenic Polite, MD, Phillips Hay   . GERD (gastroesophageal reflux disease)   . Hereditary and idiopathic peripheral neuropathy 04/17/2015  . Hyperlipemia 06/02/2013  . Hypertension   . LEG PAIN, BILATERAL 06/04/2009   Qualifier: Diagnosis of  By: Domenic Polite, MD, Phillips Hay   . PERCUTANEOUS TRANSLUMINAL CORONARY ANGIOPLASTY, HX OF 06/04/2009   Qualifier: Diagnosis of  By: Domenic Polite, MD, Phillips Hay   . Prostate CA ([place])    and basal cell nose    Surgeries: Procedure(s): RIGHT TOTAL KNEE ARTHROPLASTY on 01/03/2021   Consultants:   Discharged Condition: Improved  Hospital Course: [name] [name] is an 75 y.o. male who was admitted 01/03/2021 for operative treatment ofPrimary osteoarthritis of right knee. Patient has severe unremitting pain that affects sleep, daily activities, and work/hobbies. After pre-op clearance the patient was taken to the operating room on 01/03/2021 and underwent  Procedure(s): RIGHT TOTAL KNEE ARTHROPLASTY.    Patient was given perioperative antibiotics:  Anti-infectives (From admission, onward)   Start     Dose/Rate Route Frequency Ordered Stop   01/03/21 1930  ceFAZolin (ANCEF) IVPB 2g/100 mL premix        2 g 200 mL/hr over 30 Minutes Intravenous Every 6 hours 01/03/21 1656 01/04/21 0234   01/03/21 1030  ceFAZolin (ANCEF) IVPB 3g/100 mL premix        3 g 200 mL/hr over 30 Minutes  Intravenous On call to O.R. 01/03/21 1029 01/03/21 1313       Patient was given sequential compression devices, early ambulation, and chemoprophylaxis to prevent DVT.  Patient benefited maximally from hospital stay and there were no complications.    Recent vital signs:  Patient Vitals for the past 24 hrs:  BP Temp Temp src Pulse Resp SpO2  01/04/21 0929 (!) 149/62 98 F (36.7 C) Oral 60 (!) 22 94 %  01/04/21 0541 (!) 142/63 98.1 F (36.7 C) Oral 67 20 97 %  01/04/21 0143 138/64 97.7 F (36.5 C) Oral 69 20 97 %  01/03/21 2245 140/66 98.4 F (36.9 C) Oral 74 16 97 %  01/03/21 1941 (!) 152/68 (!) 97.3 F (36.3 C) Oral 72 18 95 %  01/03/21 1702 136/63 97.7 F (36.5 C) Oral 61 20 97 %  01/03/21 1630 126/61 -- -- (!) 59 10 98 %  01/03/21 1615 (!) 131/58 -- -- (!) 57 11 98 %  01/03/21 1600 124/61 -- -- (!) 59 18 100 %  01/03/21 1545 115/62 -- -- 64 19 98 %  01/03/21 1530 124/85 -- -- 61 16 98 %  01/03/21 1515 114/60 -- -- 65 (!) 9 99 %  01/03/21 1500 (!) 104/52 98.2 F (36.8 C) -- (!) 57 10 97 %  01/03/21 1209 134/67 -- -- (!) 58 14 98 %  01/03/21 1208 -- -- -- 61 15 100 %  01/03/21 1207 -- -- -- (!) 56 18 100 %  01/03/21 1206 -- -- -- 60 13  100 %  01/03/21 1205 -- -- -- 61 11 99 %  01/03/21 1204 128/65 -- -- (!) 59 (!) 8 97 %     Recent laboratory studies:  Recent Labs    01/04/21 0231  WBC 14.6*  HGB 12.7*  HCT 39.3  PLT 198  NA 132*  K 4.8  CL 96*  CO2 26  BUN 22  CREATININE 1.53*  GLUCOSE 177*  CALCIUM 8.9     Discharge Medications:   Allergies as of 01/04/2021      Reactions   Fenofibrate Other (See Comments)   Pt reports causes joint pains   Rosuvastatin Other (See Comments)   Pt reports causes joint pains      Medication List    STOP taking these medications   aspirin EC 81 MG tablet Replaced by: aspirin 81 MG chewable tablet     TAKE these medications   amLODipine 5 MG tablet Commonly known as: NORVASC Take 1 tablet (5 mg total) by mouth  daily.   aspirin 81 MG chewable tablet Chew 1 tablet (81 mg total) by mouth 2 (two) times daily. Replaces: aspirin EC 81 MG tablet   atenolol 25 MG tablet Commonly known as: TENORMIN Take 1 tablet (25 mg total) by mouth daily.   budesonide-formoterol 160-4.5 MCG/ACT inhaler Commonly known as: SYMBICORT Inhale 2 puffs into the lungs 2 (two) times daily. What changed:   when to take this  reasons to take this   citalopram 20 MG tablet Commonly known as: CELEXA Take 1 tablet (20 mg total) by mouth daily.   cyanocobalamin 1000 MCG/ML injection Commonly known as: (VITAMIN B-12) Inject 1 mL (1,000 mcg total) into the muscle every 30 (thirty) days.   ezetimibe 10 MG tablet Commonly known as: ZETIA Take 1 tablet (10 mg total) by mouth daily.   HYDROcodone-acetaminophen 5-325 MG tablet Commonly known as: NORCO/VICODIN Take 1-2 tablets by mouth every 6 (six) hours as needed for moderate pain.   icosapent Ethyl 1 g capsule Commonly known as: Vascepa Take 2 capsules (2 g total) by mouth 2 (two) times daily.   loperamide 2 MG tablet Commonly known as: IMODIUM A-D Take 2 mg by mouth daily.   loratadine 10 MG tablet Commonly known as: CLARITIN Take 10 mg by mouth daily as needed for allergies.   methocarbamol 500 MG tablet Commonly known as: ROBAXIN Take 1 tablet (500 mg total) by mouth every 6 (six) hours as needed for muscle spasms.   montelukast 10 MG tablet Commonly known as: SINGULAIR Take 1 tablet (10 mg total) by mouth daily.   naproxen 500 MG tablet Commonly known as: NAPROSYN Take 500 mg by mouth daily.   oxyCODONE 5 MG immediate release tablet Commonly known as: Oxy IR/ROXICODONE Take 1-2 tablets (5-10 mg total) by mouth every 6 (six) hours as needed for moderate pain (pain score 4-6).   pravastatin 20 MG tablet Commonly known as: PRAVACHOL Take 10 mg by mouth daily.   traMADol 50 MG tablet Commonly known as: ULTRAM Take 1 tablet (50 mg total) by mouth  every 6 (six) hours as needed for moderate pain.   Vitamin D3 125 MCG (5000 UT) Tabs Take 5,000 Units by mouth daily.            Durable Medical Equipment  (From admission, onward)         Start     Ordered   01/03/21 1657  DME 3 n 1  Once  01/03/21 1656   01/03/21 1657  DME Walker rolling  Once       Question Answer Comment  Walker: With 5 Inch Wheels   Patient needs a walker to treat with the following condition Status post total right knee replacement      01/03/21 1656          Diagnostic Studies: DG Knee Right Port  Result Date: 01/03/2021 CLINICAL DATA:  Status post right knee arthroplasty. EXAM: PORTABLE RIGHT KNEE - 1-2 VIEW COMPARISON:  None. FINDINGS: Right knee arthroplasty in expected alignment. No periprosthetic lucency or fracture. Patellofemoral arthroplasty also in place. Recent postsurgical change includes air and edema in the soft tissues and joint space. Anterior skin staples. IMPRESSION: Right knee arthroplasty without immediate postoperative complication. Electronically Signed   By: Keith Rake M.D.   On: 01/03/2021 15:45    Disposition: Discharge disposition: 01-Home or Self Care          Follow-up Information    Mcarthur Rossetti, MD. Go on 01/16/2021.   Specialty: Orthopedic Surgery Why: at 1:15 pm for your 2 week in office post-op appointment with Dr. Clarita Leber information: [place] [address] [phone]        [facility] Follow up.   Specialty: Rehabilitation Why: Date of your outpatient physical therapy has yet to be scheduled. Someone will be calling to get this scheduled at around 2 weeks post-op. Ortho CM will assist with this. Contact information: [address] [id number] [place] [phone]               Signed: Mcarthur Rossetti 01/04/2021, 11:16 AM

## 2021-01-04 NOTE — Discharge Instructions (Signed)

## 2021-01-05 DIAGNOSIS — E785 Hyperlipidemia, unspecified: Secondary | ICD-10-CM | POA: Diagnosis not present

## 2021-01-05 DIAGNOSIS — Z471 Aftercare following joint replacement surgery: Secondary | ICD-10-CM | POA: Diagnosis not present

## 2021-01-05 DIAGNOSIS — Z87891 Personal history of nicotine dependence: Secondary | ICD-10-CM | POA: Diagnosis not present

## 2021-01-05 DIAGNOSIS — M1712 Unilateral primary osteoarthritis, left knee: Secondary | ICD-10-CM | POA: Diagnosis not present

## 2021-01-05 DIAGNOSIS — I10 Essential (primary) hypertension: Secondary | ICD-10-CM | POA: Diagnosis not present

## 2021-01-05 DIAGNOSIS — G609 Hereditary and idiopathic neuropathy, unspecified: Secondary | ICD-10-CM | POA: Diagnosis not present

## 2021-01-05 DIAGNOSIS — F32A Depression, unspecified: Secondary | ICD-10-CM | POA: Diagnosis not present

## 2021-01-05 DIAGNOSIS — K5730 Diverticulosis of large intestine without perforation or abscess without bleeding: Secondary | ICD-10-CM | POA: Diagnosis not present

## 2021-01-05 DIAGNOSIS — Z96651 Presence of right artificial knee joint: Secondary | ICD-10-CM | POA: Diagnosis not present

## 2021-01-05 DIAGNOSIS — K219 Gastro-esophageal reflux disease without esophagitis: Secondary | ICD-10-CM | POA: Diagnosis not present

## 2021-01-05 DIAGNOSIS — Z8546 Personal history of malignant neoplasm of prostate: Secondary | ICD-10-CM | POA: Diagnosis not present

## 2021-01-05 DIAGNOSIS — G5603 Carpal tunnel syndrome, bilateral upper limbs: Secondary | ICD-10-CM | POA: Diagnosis not present

## 2021-01-05 DIAGNOSIS — J449 Chronic obstructive pulmonary disease, unspecified: Secondary | ICD-10-CM | POA: Diagnosis not present

## 2021-01-06 ENCOUNTER — Telehealth: Payer: Self-pay | Admitting: *Deleted

## 2021-01-06 NOTE — Telephone Encounter (Signed)
Ortho bundle D/C call completed. 

## 2021-01-07 ENCOUNTER — Encounter (HOSPITAL_COMMUNITY): Payer: Self-pay | Admitting: Orthopaedic Surgery

## 2021-01-07 ENCOUNTER — Telehealth: Payer: Self-pay

## 2021-01-07 DIAGNOSIS — E785 Hyperlipidemia, unspecified: Secondary | ICD-10-CM | POA: Diagnosis not present

## 2021-01-07 DIAGNOSIS — M1712 Unilateral primary osteoarthritis, left knee: Secondary | ICD-10-CM | POA: Diagnosis not present

## 2021-01-07 DIAGNOSIS — F32A Depression, unspecified: Secondary | ICD-10-CM | POA: Diagnosis not present

## 2021-01-07 DIAGNOSIS — G609 Hereditary and idiopathic neuropathy, unspecified: Secondary | ICD-10-CM | POA: Diagnosis not present

## 2021-01-07 DIAGNOSIS — I10 Essential (primary) hypertension: Secondary | ICD-10-CM | POA: Diagnosis not present

## 2021-01-07 DIAGNOSIS — Z471 Aftercare following joint replacement surgery: Secondary | ICD-10-CM | POA: Diagnosis not present

## 2021-01-07 NOTE — Telephone Encounter (Signed)
**Note De-Identified Clydell Sposito Obfuscation** Letter received from [employer] stating that they denied the pts icosapent PA because the pt does not have a DX of hypertriglyceridemia. Per the letter we can appeal their decision by witting them a letter for reconsideration.  I have wrote the letter, included a copy of the pts Lipid flowsheet, and have emailed all to Dr Jacolyn Reedy nurse so she can obtain Dr Jacolyn Reedy signature on the page allowing me to appeal this decision on the pts behalf.

## 2021-01-09 DIAGNOSIS — F32A Depression, unspecified: Secondary | ICD-10-CM | POA: Diagnosis not present

## 2021-01-09 DIAGNOSIS — Z471 Aftercare following joint replacement surgery: Secondary | ICD-10-CM | POA: Diagnosis not present

## 2021-01-09 DIAGNOSIS — G609 Hereditary and idiopathic neuropathy, unspecified: Secondary | ICD-10-CM | POA: Diagnosis not present

## 2021-01-09 DIAGNOSIS — M1712 Unilateral primary osteoarthritis, left knee: Secondary | ICD-10-CM | POA: Diagnosis not present

## 2021-01-09 DIAGNOSIS — E785 Hyperlipidemia, unspecified: Secondary | ICD-10-CM | POA: Diagnosis not present

## 2021-01-09 DIAGNOSIS — I10 Essential (primary) hypertension: Secondary | ICD-10-CM | POA: Diagnosis not present

## 2021-01-09 NOTE — Telephone Encounter (Signed)
Forms printed off and signed by Dr. Johney Frame. Placed in the nurses fax box in medical records, to be sent off to the fax information provided on the cover sheet.

## 2021-01-10 ENCOUNTER — Telehealth: Payer: Self-pay | Admitting: *Deleted

## 2021-01-10 NOTE — Telephone Encounter (Signed)
Ortho bundle 7 day call completed. 

## 2021-01-13 DIAGNOSIS — I10 Essential (primary) hypertension: Secondary | ICD-10-CM | POA: Diagnosis not present

## 2021-01-13 DIAGNOSIS — E785 Hyperlipidemia, unspecified: Secondary | ICD-10-CM | POA: Diagnosis not present

## 2021-01-13 DIAGNOSIS — Z471 Aftercare following joint replacement surgery: Secondary | ICD-10-CM | POA: Diagnosis not present

## 2021-01-13 DIAGNOSIS — F32A Depression, unspecified: Secondary | ICD-10-CM | POA: Diagnosis not present

## 2021-01-13 DIAGNOSIS — G609 Hereditary and idiopathic neuropathy, unspecified: Secondary | ICD-10-CM | POA: Diagnosis not present

## 2021-01-13 DIAGNOSIS — M1712 Unilateral primary osteoarthritis, left knee: Secondary | ICD-10-CM | POA: Diagnosis not present

## 2021-01-14 ENCOUNTER — Telehealth: Payer: Self-pay | Admitting: *Deleted

## 2021-01-14 MED ORDER — METHOCARBAMOL 500 MG PO TABS: 500.0000 mg | ORAL_TABLET | Freq: Four times a day (QID) | ORAL | 1 refills | Status: DC | PRN

## 2021-01-14 MED ORDER — OXYCODONE HCL 5 MG PO TABS: 5.0000 mg | ORAL_TABLET | Freq: Four times a day (QID) | ORAL | 0 refills | Status: DC | PRN

## 2021-01-14 NOTE — Telephone Encounter (Signed)
Patient called requesting refill of pain medication and muscle relaxer. 2 week post-op this week and OPPT already set up for Friday, 01/17/21.

## 2021-01-14 NOTE — Telephone Encounter (Signed)
I sent some in to his pharmacy. 

## 2021-01-14 NOTE — Telephone Encounter (Signed)
Call to patient and updated on medication being called into the pharmacy for refill.

## 2021-01-15 ENCOUNTER — Other Ambulatory Visit: Payer: Self-pay | Admitting: Nurse Practitioner

## 2021-01-15 DIAGNOSIS — M1712 Unilateral primary osteoarthritis, left knee: Secondary | ICD-10-CM | POA: Diagnosis not present

## 2021-01-15 DIAGNOSIS — Z471 Aftercare following joint replacement surgery: Secondary | ICD-10-CM | POA: Diagnosis not present

## 2021-01-15 DIAGNOSIS — J4520 Mild intermittent asthma, uncomplicated: Secondary | ICD-10-CM

## 2021-01-15 DIAGNOSIS — F32A Depression, unspecified: Secondary | ICD-10-CM | POA: Diagnosis not present

## 2021-01-15 DIAGNOSIS — I10 Essential (primary) hypertension: Secondary | ICD-10-CM | POA: Diagnosis not present

## 2021-01-15 DIAGNOSIS — F3342 Major depressive disorder, recurrent, in full remission: Secondary | ICD-10-CM

## 2021-01-15 DIAGNOSIS — G609 Hereditary and idiopathic neuropathy, unspecified: Secondary | ICD-10-CM | POA: Diagnosis not present

## 2021-01-15 DIAGNOSIS — E785 Hyperlipidemia, unspecified: Secondary | ICD-10-CM | POA: Diagnosis not present

## 2021-01-16 ENCOUNTER — Encounter: Payer: Self-pay | Admitting: Orthopaedic Surgery

## 2021-01-16 ENCOUNTER — Ambulatory Visit (INDEPENDENT_AMBULATORY_CARE_PROVIDER_SITE_OTHER): Payer: Medicare Other | Admitting: Orthopaedic Surgery

## 2021-01-16 DIAGNOSIS — Z96651 Presence of right artificial knee joint: Secondary | ICD-10-CM

## 2021-01-16 NOTE — Progress Notes (Signed)
The patient will be 2 weeks tomorrow status post a right total knee arthroplasty.  He is doing incredibly well from this right total knee arthroplasty.  His notes from home therapy state he lacks full extension by 4 degrees but he is already flex to over 120 degrees.  He does have pain at night which is to be expected but he is way had what most people are at 3 weeks post knee replacement.  He has been on a baby aspirin twice a day.  He was on a baby aspirin once a day prior to surgery.  Since his calf is soft there is no foot swelling we will have him go back down to just once a day baby aspirin.  He is already asking [place] about replacing his left knee which has known end-stage arthritis.  His right knee incision looks good so the staples were removed and Steri-Strips applied.  Again his range of motion is excellent.  There is moderate swelling to be expected from the surgery.  We will see him back in 4 weeks after outpatient physical therapy.  At that visit I want him to bring up to [place] scheduling his left total knee arthroplasty for sometime late July or early August.

## 2021-01-17 ENCOUNTER — Other Ambulatory Visit: Payer: Self-pay

## 2021-01-17 ENCOUNTER — Ambulatory Visit: Payer: Medicare Other | Attending: Orthopaedic Surgery | Admitting: Physical Therapy

## 2021-01-17 ENCOUNTER — Encounter: Payer: Self-pay | Admitting: Physical Therapy

## 2021-01-17 DIAGNOSIS — G8929 Other chronic pain: Secondary | ICD-10-CM | POA: Diagnosis not present

## 2021-01-17 DIAGNOSIS — R600 Localized edema: Secondary | ICD-10-CM | POA: Diagnosis not present

## 2021-01-17 DIAGNOSIS — M25561 Pain in right knee: Secondary | ICD-10-CM | POA: Insufficient documentation

## 2021-01-17 DIAGNOSIS — M25661 Stiffness of right knee, not elsewhere classified: Secondary | ICD-10-CM | POA: Diagnosis not present

## 2021-01-17 NOTE — Therapy (Signed)
[facility] [place], [place], [address] Phone: [phone]   Fax:  [phone]  Physical Therapy Evaluation  Patient Details  Name: [name] [name] MRN: [id number] Date of Birth: 10/21/1945 Referring Provider (PT): Jean Rosenthal MD   Encounter Date: 01/17/2021   PT End of Session - 01/17/21 1314    Visit Number 1    Number of Visits 12    Date for PT Re-Evaluation 04/17/21    Authorization Type FOTO AT LEAST EVERY 5TH VISIT.  PROGRESS NOTE AT 10TH VISIT.  KX MODIFIER AFTER 15 VISITS.    PT Start Time 1030    PT Stop Time 1127    PT Time Calculation (min) 57 min           Past Medical History:  Diagnosis Date  . Asthma, chronic 06/02/2013  . Carpal tunnel syndrome 04/17/2015   Bilateral  . COPD (chronic obstructive pulmonary disease) ([place])   . Depression 06/02/2013  . DYSPNEA ON EXERTION 06/04/2009   Qualifier: Diagnosis of  By: Domenic Polite, MD, Phillips Hay   . GERD (gastroesophageal reflux disease)   . Hereditary and idiopathic peripheral neuropathy 04/17/2015  . Hyperlipemia 06/02/2013  . Hypertension   . LEG PAIN, BILATERAL 06/04/2009   Qualifier: Diagnosis of  By: Domenic Polite, MD, Phillips Hay   . PERCUTANEOUS TRANSLUMINAL CORONARY ANGIOPLASTY, HX OF 06/04/2009   Qualifier: Diagnosis of  By: Domenic Polite, MD, Phillips Hay   . Prostate CA ([place])    and basal cell nose    Past Surgical History:  Procedure Laterality Date  . CARDIAC CATHETERIZATION  1997  . EXCISION MASS NECK Left 11/12/2020   Procedure: EXCISION LEFT POSTERIOR NECK MASS;  Surgeon: Coralie Keens, MD;  Location: [facility];  Service: General;  Laterality: Left;  . EYE SURGERY Bilateral 2020   cataracts removed  . HERNIA REPAIR    . HIATAL HERNIA REPAIR    . LAPAROSCOPIC GASTRIC BANDING  6301   [facility] fundoplication  . MENISCUS REPAIR Left 2012  . ROBOT ASSISTED LAPAROSCOPIC RADICAL PROSTATECTOMY    .  ROTATOR CUFF REPAIR Right 2019  . TOTAL KNEE ARTHROPLASTY Right 01/03/2021   Procedure: RIGHT TOTAL KNEE ARTHROPLASTY;  Surgeon: Mcarthur Rossetti, MD;  Location: [facility];  Service: Orthopedics;  Laterality: Right;  Needs RNFA    There were no vitals filed for this visit.    Subjective Assessment - 01/17/21 1320    Subjective COVID-19 screen performed prior to patient entering clinic.  The patient presents to the clinic today s/p right total knee replacement performed on 01/03/21.  His pain-level is a 7/10 today.  Long eriods of immobility increases his pain.  Elevation, ise and pain medication decreases his pain.  He has been compliant to a HEP.  He is walking safely without an assistive device.    Pertinent History CTS, COPD, HTN, Hernia repair, left knee ATS, right RCR.    How long can you walk comfortably? Around house.    Patient Stated Goals Return to an active lifestyle.    Currently in Pain? Yes    Pain Score 7     Pain Location Knee    Pain Orientation Right    Pain Descriptors / Indicators Aching;Throbbing    Pain Type Surgical pain    Pain Onset More than a month ago    Pain Frequency Constant    Aggravating Factors  See above.    Pain Relieving Factors See above.  [facility] PT Assessment - 01/17/21 0001      Assessment   Medical Diagnosis Right total knee replacement.    Referring Provider (PT) Jean Rosenthal MD    Onset Date/Surgical Date --   01/03/21 (surgery date).     Precautions   Precaution Comments No ultrasound.      Restrictions   Weight Bearing Restrictions No      Balance Screen   Has the patient fallen in the past 6 months No    Has the patient had a decrease in activity level because of a fear of falling?  No    Is the patient reluctant to leave their home because of a fear of falling?  No      Home Environment   Living Environment Private residence      Prior Function   Level of Independence Independent       Observation/Other Assessments   Observations Some steri-strips still intact over patient's right knee.    Focus on Therapeutic Outcomes (FOTO)  Complete.      Observation/Other Assessments-Edema    Edema Circumferential      Circumferential Edema   Circumferential - Right RT 3 cms > LT.      ROM / Strength   AROM / PROM / Strength AROM;Strength      AROM   Overall AROM Comments -10 degrees in supine (equal to left) with active flexion to 108 degrees and passive to -113 degrees.      Strength   Overall Strength Comments Right knee strength is 4 to 4+/5.      Palpation   Palpation comment C/o diffuse anterior left knee pain.      Ambulation/Gait   Gait Comments Generally normal gait cycle without assisitve device.                      Objective measurements completed on examination: See above findings.       [facility] Adult PT Treatment/Exercise - 01/17/21 0001      Exercises   Exercises Knee/Hip      Knee/Hip Exercises: Supine   Quad Sets Limitations SAQ's x 16 minutes facilitated with VMS to patient's left quadriceps with 0 sec extension holds and 10 sec rest.      Modalities   Modalities Vasopneumatic      Vasopneumatic   Number Minutes Vasopneumatic  15 minutes    Vasopnuematic Location  --   Right knee.   Vasopneumatic Pressure Medium                       PT Long Term Goals - 01/17/21 1339      PT LONG TERM GOAL #1   Title Independent with a HEP.    Time 6    Period Weeks    Status New      PT LONG TERM GOAL #2   Title Full right active knee extension in order to normalize gait.    Time 6    Period Weeks    Status New      PT LONG TERM GOAL #3   Title Active right knee flexion to 120 to 125 degrees+ so the patient can perform functional tasks and do so with pain not > 2-3/10.    Time 6    Period Weeks    Status New      PT LONG TERM GOAL #4   Title Perform a reciprocating stair gait with one railing with  pain not >  2-3/10.    Time 6    Period Weeks    Status New                  Plan - 01/17/21 1335    Clinical Impression Statement The patient presents to OPPT s/p right total knee replacement performed on 01/03/21.  He is very pleased with his surgical outcome thus far.  He lacks some right knee flexion and extension and has a minimal loss of strength.  He has some remaining edema.  He is walking safely without an assisitve device.  His fucntional mobility is currently impaired.  Patient will benefit from skilled physical therapy intervention to address pain and deficits.    Personal Factors and Comorbidities Comorbidity 1;Comorbidity 2;Other    Comorbidities CTS, COPD, HTN, Hernia repair, left knee ATS, right RCR.    Examination-Activity Limitations Other;Locomotion Level    Examination-Participation Restrictions Other    Stability/Clinical Decision Making Stable/Uncomplicated    Clinical Decision Making Low    Rehab Potential Excellent    PT Frequency 2x / week    PT Duration 6 weeks    PT Treatment/Interventions ADLs/Self Care Home Management;Cryotherapy;Electrical Stimulation;Gait training;Stair training;Functional mobility training;Therapeutic activities;Therapeutic exercise;Neuromuscular re-education;Manual techniques;Patient/family education;Passive range of motion;Vasopneumatic Device    PT Next Visit Plan Nustep with progression to recumbent bike, VMS to right quads, PROM, PRE's.  Vasopneumatic.    Consulted and Agree with Plan of Care Patient           Patient will benefit from skilled therapeutic intervention in order to improve the following deficits and impairments:  Abnormal gait,Decreased activity tolerance,Decreased range of motion,Decreased strength,Increased edema,Pain  Visit Diagnosis: Chronic pain of right knee - Plan: PT plan of care cert/re-cert  Stiffness of right knee, not elsewhere classified - Plan: PT plan of care cert/re-cert  Localized edema - Plan: PT plan  of care cert/re-cert     Problem List Patient Active Problem List   Diagnosis Date Noted  . Status post total right knee replacement 01/03/2021  . Left lower quadrant pain 09/25/2020  . Diverticular disease of colon 09/25/2020  . Colon cancer screening 09/25/2020  . Statin intolerance 12/27/2018  . BMI 28.0-28.9,adult 11/01/2018  . H/O arthroscopy of shoulder 02/10/2018  . Primary osteoarthritis of left knee 11/25/2016  . Primary osteoarthritis of right knee 11/25/2016  . Essential hypertension 06/28/2015  . H/O prostate cancer 06/28/2015  . Hereditary and idiopathic peripheral neuropathy 04/17/2015  . Carpal tunnel syndrome 04/17/2015  . Hyperlipemia 06/02/2013  . Asthma, chronic 06/02/2013  . Depression 06/02/2013  . GERD 06/04/2009  . LEG PAIN, BILATERAL 06/04/2009  . PERCUTANEOUS TRANSLUMINAL CORONARY ANGIOPLASTY, HX OF 06/04/2009    Decklyn Hornik, [place] MPT 01/17/2021, 1:43 PM  [facility] [address] [place], [place], [address] Phone: [phone]   Fax:  [phone]  Name: [name] [name] MRN: [id number] Date of Birth: 02-01-1946

## 2021-01-20 ENCOUNTER — Other Ambulatory Visit: Payer: Self-pay

## 2021-01-20 ENCOUNTER — Ambulatory Visit: Payer: Medicare Other | Admitting: Physical Therapy

## 2021-01-20 DIAGNOSIS — G8929 Other chronic pain: Secondary | ICD-10-CM

## 2021-01-20 DIAGNOSIS — M25561 Pain in right knee: Secondary | ICD-10-CM | POA: Diagnosis not present

## 2021-01-20 DIAGNOSIS — R600 Localized edema: Secondary | ICD-10-CM

## 2021-01-20 DIAGNOSIS — M25661 Stiffness of right knee, not elsewhere classified: Secondary | ICD-10-CM

## 2021-01-20 NOTE — Therapy (Signed)
[facility] [place], [place], [address] Phone: [phone]   Fax:  [phone]  Physical Therapy Treatment  Patient Details  Name: [name] [name] MRN: [id number] Date of Birth: 1946-08-09 Referring Provider (PT): Jean Rosenthal MD   Encounter Date: 01/20/2021   PT End of Session - 01/20/21 1708    Visit Number 2    Number of Visits 12    Date for PT Re-Evaluation 04/17/21    Authorization Type FOTO AT LEAST EVERY 5TH VISIT.  PROGRESS NOTE AT 10TH VISIT.  KX MODIFIER AFTER 15 VISITS.    PT Start Time 0315    PT Stop Time 0411    PT Time Calculation (min) 56 min    Activity Tolerance Patient tolerated treatment well    Behavior During Therapy WFL for tasks assessed/performed           Past Medical History:  Diagnosis Date  . Asthma, chronic 06/02/2013  . Carpal tunnel syndrome 04/17/2015   Bilateral  . COPD (chronic obstructive pulmonary disease) ([place])   . Depression 06/02/2013  . DYSPNEA ON EXERTION 06/04/2009   Qualifier: Diagnosis of  By: Domenic Polite, MD, Phillips Hay   . GERD (gastroesophageal reflux disease)   . Hereditary and idiopathic peripheral neuropathy 04/17/2015  . Hyperlipemia 06/02/2013  . Hypertension   . LEG PAIN, BILATERAL 06/04/2009   Qualifier: Diagnosis of  By: Domenic Polite, MD, Phillips Hay   . PERCUTANEOUS TRANSLUMINAL CORONARY ANGIOPLASTY, HX OF 06/04/2009   Qualifier: Diagnosis of  By: Domenic Polite, MD, Phillips Hay   . Prostate CA ([place])    and basal cell nose    Past Surgical History:  Procedure Laterality Date  . CARDIAC CATHETERIZATION  1997  . EXCISION MASS NECK Left 11/12/2020   Procedure: EXCISION LEFT POSTERIOR NECK MASS;  Surgeon: Coralie Keens, MD;  Location: [facility];  Service: General;  Laterality: Left;  . EYE SURGERY Bilateral 2020   cataracts removed  . HERNIA REPAIR    . HIATAL HERNIA REPAIR    . LAPAROSCOPIC GASTRIC BANDING  1324    [facility] fundoplication  . MENISCUS REPAIR Left 2012  . ROBOT ASSISTED LAPAROSCOPIC RADICAL PROSTATECTOMY    . ROTATOR CUFF REPAIR Right 2019  . TOTAL KNEE ARTHROPLASTY Right 01/03/2021   Procedure: RIGHT TOTAL KNEE ARTHROPLASTY;  Surgeon: Mcarthur Rossetti, MD;  Location: [facility];  Service: Orthopedics;  Laterality: Right;  Needs RNFA    There were no vitals filed for this visit.   Subjective Assessment - 01/20/21 1709    Subjective COVID-19 screen performed prior to patient entering clinic.  Doing well.    Pertinent History CTS, COPD, HTN, Hernia repair, left knee ATS, right RCR.    How long can you walk comfortably? Around house.    Currently in Pain? Yes    Pain Score 6     Pain Location Knee    Pain Orientation Right    Pain Descriptors / Indicators Aching;Throbbing    Pain Type Surgical pain    Pain Onset More than a month ago                             [facility] Adult PT Treatment/Exercise - 01/20/21 0001      Exercises   Exercises Knee/Hip      Knee/Hip Exercises: Aerobic   Nustep Level 3 x 15 minutes moving seat forward x 2 to increase knee flexion.  Knee/Hip Exercises: Supine   Quad Sets Limitations SAQ's facilitated with Bi-Phasic electrical stimulation to right quadriceps x 16 minutes with 2# with 10 sec extension holds and 10 sec rest.      Vasopneumatic   Number Minutes Vasopneumatic  20 minutes    Vasopnuematic Location  --   Right knee.   Vasopneumatic Pressure Medium                       PT Long Term Goals - 01/17/21 1339      PT LONG TERM GOAL #1   Title Independent with a HEP.    Time 6    Period Weeks    Status New      PT LONG TERM GOAL #2   Title Full right active knee extension in order to normalize gait.    Time 6    Period Weeks    Status New      PT LONG TERM GOAL #3   Title Active right knee flexion to 120 to 125 degrees+ so the patient can perform functional tasks and do so with pain not > 2-3/10.     Time 6    Period Weeks    Status New      PT LONG TERM GOAL #4   Title Perform a reciprocating stair gait with one railing with pain not > 2-3/10.    Time 6    Period Weeks    Status New                 Plan - 01/20/21 1713    Clinical Impression Statement Excellent job today with Nustep and SAQ's facilitated with Bi-Phasic electrical stimulation to his right quadriceps.  Plan to progress to recumbent bike this week.    Personal Factors and Comorbidities Comorbidity 1;Comorbidity 2;Other    Comorbidities CTS, COPD, HTN, Hernia repair, left knee ATS, right RCR.    Stability/Clinical Decision Making Stable/Uncomplicated    PT Treatment/Interventions ADLs/Self Care Home Management;Cryotherapy;Electrical Stimulation;Gait training;Stair training;Functional mobility training;Therapeutic activities;Therapeutic exercise;Neuromuscular re-education;Manual techniques;Patient/family education;Passive range of motion;Vasopneumatic Device    PT Next Visit Plan Nustep with progression to recumbent bike, VMS to right quads, PROM, PRE's.  Vasopneumatic.           Patient will benefit from skilled therapeutic intervention in order to improve the following deficits and impairments:  Abnormal gait,Decreased activity tolerance,Decreased range of motion,Decreased strength,Increased edema,Pain  Visit Diagnosis: Chronic pain of right knee  Stiffness of right knee, not elsewhere classified  Localized edema     Problem List Patient Active Problem List   Diagnosis Date Noted  . Status post total right knee replacement 01/03/2021  . Left lower quadrant pain 09/25/2020  . Diverticular disease of colon 09/25/2020  . Colon cancer screening 09/25/2020  . Statin intolerance 12/27/2018  . BMI 28.0-28.9,adult 11/01/2018  . H/O arthroscopy of shoulder 02/10/2018  . Primary osteoarthritis of left knee 11/25/2016  . Primary osteoarthritis of right knee 11/25/2016  . Essential hypertension  06/28/2015  . H/O prostate cancer 06/28/2015  . Hereditary and idiopathic peripheral neuropathy 04/17/2015  . Carpal tunnel syndrome 04/17/2015  . Hyperlipemia 06/02/2013  . Asthma, chronic 06/02/2013  . Depression 06/02/2013  . GERD 06/04/2009  . LEG PAIN, BILATERAL 06/04/2009  . PERCUTANEOUS TRANSLUMINAL CORONARY ANGIOPLASTY, HX OF 06/04/2009    Atheena Spano, [place] MPT 01/20/2021, 5:16 PM  [facility] [address] [place], [place], [address] Phone: [phone]   Fax:  [phone]  Name: [name] [name] MRN: [id number] Date of Birth: [birth year]

## 2021-01-23 ENCOUNTER — Ambulatory Visit: Payer: Medicare Other | Admitting: Cardiology

## 2021-01-23 ENCOUNTER — Telehealth: Payer: Self-pay | Admitting: *Deleted

## 2021-01-23 NOTE — Telephone Encounter (Signed)
Ortho bundle call . 

## 2021-01-24 ENCOUNTER — Other Ambulatory Visit: Payer: Self-pay

## 2021-01-24 ENCOUNTER — Ambulatory Visit: Payer: Medicare Other | Admitting: Physical Therapy

## 2021-01-24 ENCOUNTER — Encounter: Payer: Self-pay | Admitting: Physical Therapy

## 2021-01-24 DIAGNOSIS — R600 Localized edema: Secondary | ICD-10-CM | POA: Diagnosis not present

## 2021-01-24 DIAGNOSIS — G8929 Other chronic pain: Secondary | ICD-10-CM | POA: Diagnosis not present

## 2021-01-24 DIAGNOSIS — M25661 Stiffness of right knee, not elsewhere classified: Secondary | ICD-10-CM | POA: Diagnosis not present

## 2021-01-24 DIAGNOSIS — M25561 Pain in right knee: Secondary | ICD-10-CM | POA: Diagnosis not present

## 2021-01-24 NOTE — Therapy (Signed)
[facility] [place], [place], [address] Phone: [phone]   Fax:  [phone]  Physical Therapy Treatment  Patient Details  Name: [name] [name] MRN: [id number] Date of Birth: Feb 17, 1946 Referring Provider (PT): Jean Rosenthal MD   Encounter Date: 01/24/2021   PT End of Session - 01/24/21 1030    Visit Number 3    Number of Visits 12    Date for PT Re-Evaluation 04/17/21    Authorization Type FOTO AT LEAST EVERY 5TH VISIT.  PROGRESS NOTE AT 10TH VISIT.  KX MODIFIER AFTER 15 VISITS.    PT Start Time 0900    PT Stop Time 0948    PT Time Calculation (min) 48 min           Past Medical History:  Diagnosis Date  . Asthma, chronic 06/02/2013  . Carpal tunnel syndrome 04/17/2015   Bilateral  . COPD (chronic obstructive pulmonary disease) ([place])   . Depression 06/02/2013  . DYSPNEA ON EXERTION 06/04/2009   Qualifier: Diagnosis of  By: Domenic Polite, MD, Phillips Hay   . GERD (gastroesophageal reflux disease)   . Hereditary and idiopathic peripheral neuropathy 04/17/2015  . Hyperlipemia 06/02/2013  . Hypertension   . LEG PAIN, BILATERAL 06/04/2009   Qualifier: Diagnosis of  By: Domenic Polite, MD, Phillips Hay   . PERCUTANEOUS TRANSLUMINAL CORONARY ANGIOPLASTY, HX OF 06/04/2009   Qualifier: Diagnosis of  By: Domenic Polite, MD, Phillips Hay   . Prostate CA ([place])    and basal cell nose    Past Surgical History:  Procedure Laterality Date  . CARDIAC CATHETERIZATION  1997  . EXCISION MASS NECK Left 11/12/2020   Procedure: EXCISION LEFT POSTERIOR NECK MASS;  Surgeon: Coralie Keens, MD;  Location: [facility];  Service: General;  Laterality: Left;  . EYE SURGERY Bilateral 2020   cataracts removed  . HERNIA REPAIR    . HIATAL HERNIA REPAIR    . LAPAROSCOPIC GASTRIC BANDING  2694   [facility] fundoplication  . MENISCUS REPAIR Left 2012  . ROBOT ASSISTED LAPAROSCOPIC RADICAL PROSTATECTOMY    .  ROTATOR CUFF REPAIR Right 2019  . TOTAL KNEE ARTHROPLASTY Right 01/03/2021   Procedure: RIGHT TOTAL KNEE ARTHROPLASTY;  Surgeon: Mcarthur Rossetti, MD;  [facility];  Service: Orthopedics;  Laterality: Right;  Needs RNFA    There were no vitals filed for this visit.   Subjective Assessment - 01/24/21 1029    Subjective COVID-19 screen performed prior to patient entering clinic.  Doing good.    Pertinent History CTS, COPD, HTN, Hernia repair, left knee ATS, right RCR.    How long can you walk comfortably? Around house.    Patient Stated Goals Return to an active lifestyle.    Currently in Pain? Yes    Pain Score 5     Pain Location Knee    Pain Orientation Right    Pain Descriptors / Indicators Aching;Throbbing    Pain Type Surgical pain    Pain Onset More than a month ago                             [facility] Adult PT Treatment/Exercise - 01/24/21 0001      Exercises   Exercises Knee/Hip      Knee/Hip Exercises: Aerobic   Recumbent Bike Level 3 x 10 minutes progressing to seat 7.    Nustep Level x 10 minutes.  Knee/Hip Exercises: Machines for Strengthening   Cybex Knee Extension 10# x 3 minutes.    Cybex Knee Flexion 30# x 3 minutes.      Modalities   Modalities Vasopneumatic      Vasopneumatic   Number Minutes Vasopneumatic  15 minutes    Vasopnuematic Location  --   RT knee.   Vasopneumatic Pressure Medium                       PT Long Term Goals - 01/17/21 1339      PT LONG TERM GOAL #1   Title Independent with a HEP.    Time 6    Period Weeks    Status New      PT LONG TERM GOAL #2   Title Full right active knee extension in order to normalize gait.    Time 6    Period Weeks    Status New      PT LONG TERM GOAL #3   Title Active right knee flexion to 120 to 125 degrees+ so the patient can perform functional tasks and do so with pain not > 2-3/10.    Time 6    Period Weeks    Status New      PT LONG TERM GOAL  #4   Title Perform a reciprocating stair gait with one railing with pain not > 2-3/10.    Time 6    Period Weeks    Status New                 Plan - 01/24/21 1032    Clinical Impression Statement The patient is making excellent progress.  He progressed to the recumbent bike and resisted weight machines (quad/ham) without complaint.    Personal Factors and Comorbidities Comorbidity 1;Comorbidity 2;Other    Comorbidities CTS, COPD, HTN, Hernia repair, left knee ATS, right RCR.    Examination-Activity Limitations Other;Locomotion Level    Examination-Participation Restrictions Other    Stability/Clinical Decision Making Stable/Uncomplicated    Rehab Potential Excellent    PT Frequency 2x / week    PT Duration 6 weeks    PT Treatment/Interventions ADLs/Self Care Home Management;Cryotherapy;Electrical Stimulation;Gait training;Stair training;Functional mobility training;Therapeutic activities;Therapeutic exercise;Neuromuscular re-education;Manual techniques;Patient/family education;Passive range of motion;Vasopneumatic Device    PT Next Visit Plan Nustep with progression to recumbent bike, VMS to right quads, PROM, PRE's.  Vasopneumatic.    Consulted and Agree with Plan of Care Patient           Patient will benefit from skilled therapeutic intervention in order to improve the following deficits and impairments:  Abnormal gait,Decreased activity tolerance,Decreased range of motion,Decreased strength,Increased edema,Pain  Visit Diagnosis: Chronic pain of right knee  Stiffness of right knee, not elsewhere classified  Localized edema     Problem List Patient Active Problem List   Diagnosis Date Noted  . Status post total right knee replacement 01/03/2021  . Left lower quadrant pain 09/25/2020  . Diverticular disease of colon 09/25/2020  . Colon cancer screening 09/25/2020  . Statin intolerance 12/27/2018  . BMI 28.0-28.9,adult 11/01/2018  . H/O arthroscopy of shoulder  02/10/2018  . Primary osteoarthritis of left knee 11/25/2016  . Primary osteoarthritis of right knee 11/25/2016  . Essential hypertension 06/28/2015  . H/O prostate cancer 06/28/2015  . Hereditary and idiopathic peripheral neuropathy 04/17/2015  . Carpal tunnel syndrome 04/17/2015  . Hyperlipemia 06/02/2013  . Asthma, chronic 06/02/2013  . Depression 06/02/2013  . GERD 06/04/2009  .  LEG PAIN, BILATERAL 06/04/2009  . PERCUTANEOUS TRANSLUMINAL CORONARY ANGIOPLASTY, HX OF 06/04/2009    [name] [name], [name] [name] 01/24/2021, 10:38 AM  [facility] [address] [place], [place], [address] Phone: [phone]   Fax:  [phone]  Name: [name] [name] MRN: [id number] Date of Birth: 09-Oct-1945

## 2021-01-28 ENCOUNTER — Other Ambulatory Visit: Payer: Self-pay

## 2021-01-28 ENCOUNTER — Ambulatory Visit: Payer: Medicare Other | Admitting: Physical Therapy

## 2021-01-28 DIAGNOSIS — G8929 Other chronic pain: Secondary | ICD-10-CM | POA: Diagnosis not present

## 2021-01-28 DIAGNOSIS — M25561 Pain in right knee: Secondary | ICD-10-CM | POA: Diagnosis not present

## 2021-01-28 DIAGNOSIS — R600 Localized edema: Secondary | ICD-10-CM | POA: Diagnosis not present

## 2021-01-28 DIAGNOSIS — M25661 Stiffness of right knee, not elsewhere classified: Secondary | ICD-10-CM

## 2021-01-28 NOTE — Therapy (Signed)
[facility] [place], [place], [address] Phone: [phone]   Fax:  [phone]  Physical Therapy Treatment  Patient Details  Name: [name] [name] MRN: [id number] Date of Birth: [birth year] Referring Provider (PT): Jean Rosenthal MD   Encounter Date: 01/28/2021   PT End of Session - 01/28/21 1023    Visit Number 4    Number of Visits 12    Date for PT Re-Evaluation 04/17/21    Authorization Type FOTO AT LEAST EVERY 5TH VISIT.  PROGRESS NOTE AT 10TH VISIT.  KX MODIFIER AFTER 15 VISITS.    PT Start Time [phone]    PT Stop Time 1035    PT Time Calculation (min) 48 min    Activity Tolerance Patient tolerated treatment well    Behavior During Therapy WFL for tasks assessed/performed           Past Medical History:  Diagnosis Date  . Asthma, chronic 06/02/2013  . Carpal tunnel syndrome 04/17/2015   Bilateral  . COPD (chronic obstructive pulmonary disease) ([place])   . Depression 06/02/2013  . DYSPNEA ON EXERTION 06/04/2009   Qualifier: Diagnosis of  By: Domenic Polite, MD, Phillips Hay   . GERD (gastroesophageal reflux disease)   . Hereditary and idiopathic peripheral neuropathy 04/17/2015  . Hyperlipemia 06/02/2013  . Hypertension   . LEG PAIN, BILATERAL 06/04/2009   Qualifier: Diagnosis of  By: Domenic Polite, MD, Phillips Hay   . PERCUTANEOUS TRANSLUMINAL CORONARY ANGIOPLASTY, HX OF 06/04/2009   Qualifier: Diagnosis of  By: Domenic Polite, MD, Phillips Hay   . Prostate CA ([place])    and basal cell nose    Past Surgical History:  Procedure Laterality Date  . CARDIAC CATHETERIZATION  1997  . EXCISION MASS NECK Left 11/12/2020   Procedure: EXCISION LEFT POSTERIOR NECK MASS;  Surgeon: Coralie Keens, MD;  Location: [facility];  Service: General;  Laterality: Left;  . EYE SURGERY Bilateral 2020   cataracts removed  . HERNIA REPAIR    . HIATAL HERNIA REPAIR    . LAPAROSCOPIC GASTRIC BANDING  2878    [facility] fundoplication  . MENISCUS REPAIR Left 2012  . ROBOT ASSISTED LAPAROSCOPIC RADICAL PROSTATECTOMY    . ROTATOR CUFF REPAIR Right 2019  . TOTAL KNEE ARTHROPLASTY Right 01/03/2021   Procedure: RIGHT TOTAL KNEE ARTHROPLASTY;  Surgeon: Mcarthur Rossetti, MD;  [facility];  Service: Orthopedics;  Laterality: Right;  Needs RNFA    There were no vitals filed for this visit.   Subjective Assessment - 01/28/21 0952    Subjective COVID-19 screen performed prior to patient entering clinic.  No pain complaints today    Pertinent History CTS, COPD, HTN, Hernia repair, left knee ATS, right RCR.    How long can you walk comfortably? Around house.    Patient Stated Goals Return to an active lifestyle.    Currently in Pain? No/denies              [facility] PT Assessment - 01/28/21 0001      ROM / Strength   AROM / PROM / Strength AROM;PROM      AROM   AROM Assessment Site Knee    Right/Left Knee Right    Right Knee Extension -8    Right Knee Flexion 116      PROM   PROM Assessment Site Knee    Right/Left Knee Right    Right Knee Extension -4    Right Knee Flexion 123  Strength   Strength Assessment Site --    Right/Left Knee --                         [facility] Adult PT Treatment/Exercise - 01/28/21 0001      Knee/Hip Exercises: Aerobic   Recumbent Bike L3 x8min      Knee/Hip Exercises: Standing   Hip Abduction Stengthening;Right;20 reps;Knee straight   red band   Forward Step Up Right;20 reps;Step Height: 6"    Step Down Right;20 reps;Step Height: 6"    Rocker Board 3 minutes      Knee/Hip Exercises: Supine   Bridges Strengthening;Both;20 reps    Straight Leg Raise with External Rotation Strengthening;Right;20 reps      Vasopneumatic   Number Minutes Vasopneumatic  15 minutes    Vasopnuematic Location  Knee   right   Vasopneumatic Pressure Medium    Vasopneumatic Temperature  34 for edema      Manual Therapy   Manual Therapy Passive ROM     Passive ROM manual PROM to right knee for flexion and ext with holds to improve mobility                  PT Education - 01/28/21 1028    Education Details HEP    Person(s) Educated Patient    Methods Explanation;Demonstration;Handout    Comprehension Verbalized understanding;Returned demonstration               PT Long Term Goals - 01/28/21 0952      PT LONG TERM GOAL #1   Title Independent with a HEP.    Time 6    Period Weeks    Status On-going      PT LONG TERM GOAL #2   Title Full right active knee extension in order to normalize gait.    Baseline AROM -8 degrees 01/28/21    Time 6    Period Weeks    Status On-going      PT LONG TERM GOAL #3   Title Active right knee flexion to 120 to 125 degrees+ so the patient can perform functional tasks and do so with pain not > 2-3/10.    Baseline AROM 116 degrees 01/28/21    Time 6    Period Weeks    Status On-going      PT LONG TERM GOAL #4   Title Perform a reciprocating stair gait with one railing with pain not > 2-3/10.    Time 6    Period Weeks    Status On-going                 Plan - 01/28/21 1024    Clinical Impression Statement Patient tolerated treatment well today. Patient able to progress with right knee ROM esp after stretching and PRE's for right LE. patient has reported no pain today and able to perfrom all ADL's with greater ease. Patient current goals progressing. inital HEP issued.    Personal Factors and Comorbidities Comorbidity 1;Comorbidity 2;Other    Comorbidities CTS, COPD, HTN, Hernia repair, left knee ATS, right RCR.    Examination-Activity Limitations Other;Locomotion Level    Examination-Participation Restrictions Other    Stability/Clinical Decision Making Stable/Uncomplicated    Rehab Potential Excellent    PT Frequency 2x / week    PT Duration 6 weeks    PT Treatment/Interventions ADLs/Self Care Home Management;Cryotherapy;Electrical Stimulation;Gait training;Stair  training;Functional mobility training;Therapeutic activities;Therapeutic exercise;Neuromuscular re-education;Manual techniques;Patient/family education;Passive range of motion;Vasopneumatic  Device    PT Next Visit Plan cont with ROM and PRE's VASO PRN    Consulted and Agree with Plan of Care Patient           Patient will benefit from skilled therapeutic intervention in order to improve the following deficits and impairments:  Abnormal gait,Decreased activity tolerance,Decreased range of motion,Decreased strength,Increased edema,Pain  Visit Diagnosis: Chronic pain of right knee  Stiffness of right knee, not elsewhere classified  Localized edema     Problem List Patient Active Problem List   Diagnosis Date Noted  . Status post total right knee replacement 01/03/2021  . Left lower quadrant pain 09/25/2020  . Diverticular disease of colon 09/25/2020  . Colon cancer screening 09/25/2020  . Statin intolerance 12/27/2018  . BMI 28.0-28.9,adult 11/01/2018  . H/O arthroscopy of shoulder 02/10/2018  . Primary osteoarthritis of left knee 11/25/2016  . Primary osteoarthritis of right knee 11/25/2016  . Essential hypertension 06/28/2015  . H/O prostate cancer 06/28/2015  . Hereditary and idiopathic peripheral neuropathy 04/17/2015  . Carpal tunnel syndrome 04/17/2015  . Hyperlipemia 06/02/2013  . Asthma, chronic 06/02/2013  . Depression 06/02/2013  . GERD 06/04/2009  . LEG PAIN, BILATERAL 06/04/2009  . PERCUTANEOUS TRANSLUMINAL CORONARY ANGIOPLASTY, HX OF 06/04/2009    Anastashia Westerfeld P, PTA 01/28/2021, 10:36 AM  [facility] [place], [place], [address] Phone: [phone]   Fax:  [phone]  Name: [name] [name] MRN: [id number] Date of Birth: 02-Apr-1946

## 2021-01-28 NOTE — Patient Instructions (Signed)
  Step-Down / Step-Up   Stand on stair step or __6__ inch stool. Slowly bend left leg, lowering other foot to floor. Return by straightening front leg. Repeat __10__ times per set. Do __2-3__ sets per session. Do __2-3__ sessions per day.      Strengthening: Hip Abduction (Side-Lying)  Strengthening: Straight Leg Raise (Phase 1)  Repeat _10___ times per set. Do __2__ sets per session. Do __2__ sessions per day.   Bridging   Slowly raise buttocks from floor, keeping stomach tight. Repeat _10___ times per set. Do __2__ sets per session. Do __2__ sessions per day.   Straight Leg Raise   Tighten stomach and turn foot out slightly thenslowly raise locked right leg __4__ inches from floor. Repeat __10-30__ times per set. Do __2__ sets per session. Do __2__ sessions per day.  ABDUCTION: Standing (Active)   Stand, feet flat. Lift right leg out to side. Use band  Complete __10-30_ repetitions. Perform __2_ sessions per day.

## 2021-01-30 ENCOUNTER — Ambulatory Visit: Payer: Medicare Other | Admitting: *Deleted

## 2021-01-30 ENCOUNTER — Other Ambulatory Visit: Payer: Self-pay

## 2021-01-30 DIAGNOSIS — G8929 Other chronic pain: Secondary | ICD-10-CM

## 2021-01-30 DIAGNOSIS — R600 Localized edema: Secondary | ICD-10-CM

## 2021-01-30 DIAGNOSIS — M25661 Stiffness of right knee, not elsewhere classified: Secondary | ICD-10-CM | POA: Diagnosis not present

## 2021-01-30 DIAGNOSIS — M25561 Pain in right knee: Secondary | ICD-10-CM | POA: Diagnosis not present

## 2021-01-30 NOTE — Therapy (Signed)
[facility] [place], [place], [address] Phone: [phone]   Fax:  [phone]  Physical Therapy Treatment  Patient Details  Name: [name] [name] MRN: [id number] Date of Birth: Feb 05, 1946 Referring Provider (PT): Jean Rosenthal MD   Encounter Date: 01/30/2021   PT End of Session - 01/30/21 1131    Visit Number 5    Number of Visits 12    Date for PT Re-Evaluation 04/17/21    Authorization Type FOTO AT LEAST EVERY 5TH VISIT.  PROGRESS NOTE AT 10TH VISIT.  KX MODIFIER AFTER 15 VISITS.    PT Start Time 0945    PT Stop Time 1035    PT Time Calculation (min) 50 min           Past Medical History:  Diagnosis Date  . Asthma, chronic 06/02/2013  . Carpal tunnel syndrome 04/17/2015   Bilateral  . COPD (chronic obstructive pulmonary disease) ([place])   . Depression 06/02/2013  . DYSPNEA ON EXERTION 06/04/2009   Qualifier: Diagnosis of  By: Domenic Polite, MD, Phillips Hay   . GERD (gastroesophageal reflux disease)   . Hereditary and idiopathic peripheral neuropathy 04/17/2015  . Hyperlipemia 06/02/2013  . Hypertension   . LEG PAIN, BILATERAL 06/04/2009   Qualifier: Diagnosis of  By: Domenic Polite, MD, Phillips Hay   . PERCUTANEOUS TRANSLUMINAL CORONARY ANGIOPLASTY, HX OF 06/04/2009   Qualifier: Diagnosis of  By: Domenic Polite, MD, Phillips Hay   . Prostate CA ([place])    and basal cell nose    Past Surgical History:  Procedure Laterality Date  . CARDIAC CATHETERIZATION  1997  . EXCISION MASS NECK Left 11/12/2020   Procedure: EXCISION LEFT POSTERIOR NECK MASS;  Surgeon: Coralie Keens, MD;  Location: [place];  Service: General;  Laterality: Left;  . EYE SURGERY Bilateral 2020   cataracts removed  . HERNIA REPAIR    . HIATAL HERNIA REPAIR    . LAPAROSCOPIC GASTRIC BANDING  7253   [facility] fundoplication  . MENISCUS REPAIR Left 2012  . ROBOT ASSISTED LAPAROSCOPIC RADICAL PROSTATECTOMY    .  ROTATOR CUFF REPAIR Right 2019  . TOTAL KNEE ARTHROPLASTY Right 01/03/2021   Procedure: RIGHT TOTAL KNEE ARTHROPLASTY;  Surgeon: Mcarthur Rossetti, MD;  Location: [facility];  Service: Orthopedics;  Laterality: Right;  Needs RNFA    There were no vitals filed for this visit.   Subjective Assessment - 01/30/21 0950    Subjective COVID-19 screen performed prior to patient entering clinic.  No pain complaints today    Pertinent History CTS, COPD, HTN, Hernia repair, left knee ATS, right RCR.    How long can you walk comfortably? Around house.    Patient Stated Goals Return to an active lifestyle.                             [facility] Adult PT Treatment/Exercise - 01/30/21 0001      Exercises   Exercises Knee/Hip      Knee/Hip Exercises: Aerobic   Recumbent Bike L4 x59min      Knee/Hip Exercises: Standing   Forward Lunges Right;2 sets;10 reps    Forward Step Up Right;20 reps;Step Height: 6";10 reps    Step Down Right;20 reps;Step Height: 4";10 reps    Rocker Board 3 minutes      Knee/Hip Exercises: Seated   Long Arc Quad AROM;Right;20 reps      Modalities   Modalities Vasopneumatic  Vasopneumatic   Number Minutes Vasopneumatic  15 minutes    Vasopnuematic Location  Knee   right   Vasopneumatic Pressure Medium    Vasopneumatic Temperature  34 for edema      Manual Therapy   Manual Therapy Passive ROM    Passive ROM manual PROM to right knee for  ext with holds to improve mobility x6 mins                       PT Long Term Goals - 01/28/21 5956      PT LONG TERM GOAL #1   Title Independent with a HEP.    Time 6    Period Weeks    Status On-going      PT LONG TERM GOAL #2   Title Full right active knee extension in order to normalize gait.    Baseline AROM -8 degrees 01/28/21    Time 6    Period Weeks    Status On-going      PT LONG TERM GOAL #3   Title Active right knee flexion to 120 to 125 degrees+ so the patient can perform  functional tasks and do so with pain not > 2-3/10.    Baseline AROM 116 degrees 01/28/21    Time 6    Period Weeks    Status On-going      PT LONG TERM GOAL #4   Title Perform a reciprocating stair gait with one railing with pain not > 2-3/10.    Time 6    Period Weeks    Status On-going                 Plan - 01/30/21 1127    Clinical Impression Statement Pt arrived today doing fairly well with RT knee. Rx focused on strengthening as well as ROM for RT LE. He did well with step ups and step downs with some UE assist still. Pt reports ADL's are getting easier.    Personal Factors and Comorbidities Comorbidity 1;Comorbidity 2;Other    Comorbidities CTS, COPD, HTN, Hernia repair, left knee ATS, right RCR.    Examination-Activity Limitations Other;Locomotion Level    Examination-Participation Restrictions Other    Rehab Potential Excellent    PT Frequency 2x / week    PT Duration 6 weeks    PT Treatment/Interventions ADLs/Self Care Home Management;Cryotherapy;Electrical Stimulation;Gait training;Stair training;Functional mobility training;Therapeutic activities;Therapeutic exercise;Neuromuscular re-education;Manual techniques;Patient/family education;Passive range of motion;Vasopneumatic Device    PT Next Visit Plan cont with ROM and PRE's VASO PRN           Patient will benefit from skilled therapeutic intervention in order to improve the following deficits and impairments:  Abnormal gait,Decreased activity tolerance,Decreased range of motion,Decreased strength,Increased edema,Pain  Visit Diagnosis: Chronic pain of right knee  Stiffness of right knee, not elsewhere classified  Localized edema     Problem List Patient Active Problem List   Diagnosis Date Noted  . Status post total right knee replacement 01/03/2021  . Left lower quadrant pain 09/25/2020  . Diverticular disease of colon 09/25/2020  . Colon cancer screening 09/25/2020  . Statin intolerance 12/27/2018   . BMI 28.0-28.9,adult 11/01/2018  . H/O arthroscopy of shoulder 02/10/2018  . Primary osteoarthritis of left knee 11/25/2016  . Primary osteoarthritis of right knee 11/25/2016  . Essential hypertension 06/28/2015  . H/O prostate cancer 06/28/2015  . Hereditary and idiopathic peripheral neuropathy 04/17/2015  . Carpal tunnel syndrome 04/17/2015  . Hyperlipemia 06/02/2013  .  Asthma, chronic 06/02/2013  . Depression 06/02/2013  . GERD 06/04/2009  . LEG PAIN, BILATERAL 06/04/2009  . PERCUTANEOUS TRANSLUMINAL CORONARY ANGIOPLASTY, HX OF 06/04/2009    Lizett Chowning,CHRIS, PTA 01/30/2021, 1:09 PM  [facility] [place], [place], [address] Phone: [phone]   Fax:  [phone]  Name: [name] [name] MRN: [id number] Date of Birth: April 08, 1946

## 2021-02-04 ENCOUNTER — Ambulatory Visit: Payer: Medicare Other | Admitting: Physical Therapy

## 2021-02-04 ENCOUNTER — Other Ambulatory Visit: Payer: Self-pay

## 2021-02-04 DIAGNOSIS — M25561 Pain in right knee: Secondary | ICD-10-CM

## 2021-02-04 DIAGNOSIS — G8929 Other chronic pain: Secondary | ICD-10-CM | POA: Diagnosis not present

## 2021-02-04 DIAGNOSIS — R600 Localized edema: Secondary | ICD-10-CM

## 2021-02-04 DIAGNOSIS — M25661 Stiffness of right knee, not elsewhere classified: Secondary | ICD-10-CM | POA: Diagnosis not present

## 2021-02-04 NOTE — Therapy (Signed)
[facility] [place], [place], [address] Phone: [phone]   Fax:  [phone]  Physical Therapy Treatment  Patient Details  Name: [name] [name] MRN: [id number] Date of Birth: 03-Jun-1946 Referring Provider (PT): Jean Rosenthal MD   Encounter Date: 02/04/2021   PT End of Session - 02/04/21 1104    Visit Number 6    Number of Visits 12    Date for PT Re-Evaluation 04/17/21    Authorization Type FOTO AT LEAST EVERY 5TH VISIT.  PROGRESS NOTE AT 10TH VISIT.  KX MODIFIER AFTER 15 VISITS.    PT Start Time 0945    PT Stop Time 1038    PT Time Calculation (min) 53 min    Behavior During Therapy WFL for tasks assessed/performed           Past Medical History:  Diagnosis Date  . Asthma, chronic 06/02/2013  . Carpal tunnel syndrome 04/17/2015   Bilateral  . COPD (chronic obstructive pulmonary disease) ([place])   . Depression 06/02/2013  . DYSPNEA ON EXERTION 06/04/2009   Qualifier: Diagnosis of  By: Domenic Polite, MD, Phillips Hay   . GERD (gastroesophageal reflux disease)   . Hereditary and idiopathic peripheral neuropathy 04/17/2015  . Hyperlipemia 06/02/2013  . Hypertension   . LEG PAIN, BILATERAL 06/04/2009   Qualifier: Diagnosis of  By: Domenic Polite, MD, Phillips Hay   . PERCUTANEOUS TRANSLUMINAL CORONARY ANGIOPLASTY, HX OF 06/04/2009   Qualifier: Diagnosis of  By: Domenic Polite, MD, Phillips Hay   . Prostate CA ([place])    and basal cell nose    Past Surgical History:  Procedure Laterality Date  . CARDIAC CATHETERIZATION  1997  . EXCISION MASS NECK Left 11/12/2020   Procedure: EXCISION LEFT POSTERIOR NECK MASS;  Surgeon: Coralie Keens, MD;  Location: [place];  Service: General;  Laterality: Left;  . EYE SURGERY Bilateral 2020   cataracts removed  . HERNIA REPAIR    . HIATAL HERNIA REPAIR    . LAPAROSCOPIC GASTRIC BANDING  6789   [facility] fundoplication  . MENISCUS REPAIR Left 2012  .  ROBOT ASSISTED LAPAROSCOPIC RADICAL PROSTATECTOMY    . ROTATOR CUFF REPAIR Right 2019  . TOTAL KNEE ARTHROPLASTY Right 01/03/2021   Procedure: RIGHT TOTAL KNEE ARTHROPLASTY;  Surgeon: Mcarthur Rossetti, MD;  Location: [facility];  Service: Orthopedics;  Laterality: Right;  Needs RNFA    There were no vitals filed for this visit.   Subjective Assessment - 02/04/21 1104    Subjective COVID-19 screen performed prior to patient entering clinic.  Patient very pleased with progress.    Pertinent History CTS, COPD, HTN, Hernia repair, left knee ATS, right RCR.    How long can you walk comfortably? Around house.    Patient Stated Goals Return to an active lifestyle.    Currently in Pain? Yes    Pain Score 5     Pain Location Knee    Pain Orientation Right    Pain Descriptors / Indicators Aching;Throbbing    Pain Type Surgical pain    Pain Onset More than a month ago                             [facility] Adult PT Treatment/Exercise - 02/04/21 0001      Exercises   Exercises Knee/Hip      Knee/Hip Exercises: Aerobic   Recumbent Bike Level 4 x 15 minutes.  Knee/Hip Exercises: Machines for Strengthening   Cybex Knee Extension 10# x 3 minutes.    Cybex Knee Flexion 40# x 3 minutes.    Cybex Leg Press 3 plates x 4 minutes.      Vasopneumatic   Number Minutes Vasopneumatic  20 minutes    Vasopnuematic Location  --   Right knee.   Vasopneumatic Pressure Medium                       PT Long Term Goals - 01/28/21 1610      PT LONG TERM GOAL #1   Title Independent with a HEP.    Time 6    Period Weeks    Status On-going      PT LONG TERM GOAL #2   Title Full right active knee extension in order to normalize gait.    Baseline AROM -8 degrees 01/28/21    Time 6    Period Weeks    Status On-going      PT LONG TERM GOAL #3   Title Active right knee flexion to 120 to 125 degrees+ so the patient can perform functional tasks and do so with pain not >  2-3/10.    Baseline AROM 116 degrees 01/28/21    Time 6    Period Weeks    Status On-going      PT LONG TERM GOAL #4   Title Perform a reciprocating stair gait with one railing with pain not > 2-3/10.    Time 6    Period Weeks    Status On-going                 Plan - 02/04/21 1116    Clinical Impression Statement patient highly motivated and is making excellent progress toward goals.  He was able to tolerated resisted weight machines without complaint and very good technique.    Personal Factors and Comorbidities Comorbidity 1;Comorbidity 2;Other    Comorbidities CTS, COPD, HTN, Hernia repair, left knee ATS, right RCR.    Examination-Activity Limitations Other;Locomotion Level    Examination-Participation Restrictions Other    Stability/Clinical Decision Making Stable/Uncomplicated    Rehab Potential Excellent    PT Frequency 2x / week    PT Duration 6 weeks    PT Treatment/Interventions ADLs/Self Care Home Management;Cryotherapy;Electrical Stimulation;Gait training;Stair training;Functional mobility training;Therapeutic activities;Therapeutic exercise;Neuromuscular re-education;Manual techniques;Patient/family education;Passive range of motion;Vasopneumatic Device    PT Next Visit Plan cont with ROM and PRE's VASO PRN    Consulted and Agree with Plan of Care Patient           Patient will benefit from skilled therapeutic intervention in order to improve the following deficits and impairments:  Abnormal gait,Decreased activity tolerance,Decreased range of motion,Decreased strength,Increased edema,Pain  Visit Diagnosis: Chronic pain of right knee  Stiffness of right knee, not elsewhere classified  Localized edema     Problem List Patient Active Problem List   Diagnosis Date Noted  . Status post total right knee replacement 01/03/2021  . Left lower quadrant pain 09/25/2020  . Diverticular disease of colon 09/25/2020  . Colon cancer screening 09/25/2020  .  Statin intolerance 12/27/2018  . BMI 28.0-28.9,adult 11/01/2018  . H/O arthroscopy of shoulder 02/10/2018  . Primary osteoarthritis of left knee 11/25/2016  . Primary osteoarthritis of right knee 11/25/2016  . Essential hypertension 06/28/2015  . H/O prostate cancer 06/28/2015  . Hereditary and idiopathic peripheral neuropathy 04/17/2015  . Carpal tunnel syndrome 04/17/2015  . Hyperlipemia 06/02/2013  .  Asthma, chronic 06/02/2013  . Depression 06/02/2013  . GERD 06/04/2009  . LEG PAIN, BILATERAL 06/04/2009  . PERCUTANEOUS TRANSLUMINAL CORONARY ANGIOPLASTY, HX OF 06/04/2009    Shatonya Passon, [place] MPT 02/04/2021, 11:18 AM  [facility] [address] [place], [place], [address] Phone: [phone]   Fax:  [phone]  Name: [name] [name] MRN: [id number] Date of Birth: [birth year]

## 2021-02-06 ENCOUNTER — Telehealth: Payer: Self-pay | Admitting: *Deleted

## 2021-02-06 ENCOUNTER — Ambulatory Visit: Payer: Medicare Other | Admitting: Physical Therapy

## 2021-02-06 ENCOUNTER — Other Ambulatory Visit: Payer: Self-pay

## 2021-02-06 ENCOUNTER — Encounter: Payer: Self-pay | Admitting: Physical Therapy

## 2021-02-06 DIAGNOSIS — R600 Localized edema: Secondary | ICD-10-CM

## 2021-02-06 DIAGNOSIS — M25661 Stiffness of right knee, not elsewhere classified: Secondary | ICD-10-CM | POA: Diagnosis not present

## 2021-02-06 DIAGNOSIS — G8929 Other chronic pain: Secondary | ICD-10-CM

## 2021-02-06 DIAGNOSIS — M25561 Pain in right knee: Secondary | ICD-10-CM | POA: Diagnosis not present

## 2021-02-06 NOTE — Therapy (Signed)
[facility] [place], [place], [address] Phone: [phone]   Fax:  [phone]  Physical Therapy Treatment  Patient Details  Name: [name] [name] MRN: [id number] Date of Birth: August 11, 1946 Referring Provider (PT): Jean Rosenthal MD   Encounter Date: 02/06/2021   PT End of Session - 02/06/21 1104    Visit Number 7    Number of Visits 12    Date for PT Re-Evaluation 04/17/21    Authorization Type FOTO AT LEAST EVERY 5TH VISIT.  PROGRESS NOTE AT 10TH VISIT.  KX MODIFIER AFTER 15 VISITS.    PT Start Time 0945    PT Stop Time 1041    PT Time Calculation (min) 56 min    Activity Tolerance Patient tolerated treatment well    Behavior During Therapy WFL for tasks assessed/performed           Past Medical History:  Diagnosis Date  . Asthma, chronic 06/02/2013  . Carpal tunnel syndrome 04/17/2015   Bilateral  . COPD (chronic obstructive pulmonary disease) ([place])   . Depression 06/02/2013  . DYSPNEA ON EXERTION 06/04/2009   Qualifier: Diagnosis of  By: Domenic Polite, MD, Phillips Hay   . GERD (gastroesophageal reflux disease)   . Hereditary and idiopathic peripheral neuropathy 04/17/2015  . Hyperlipemia 06/02/2013  . Hypertension   . LEG PAIN, BILATERAL 06/04/2009   Qualifier: Diagnosis of  By: Domenic Polite, MD, Phillips Hay   . PERCUTANEOUS TRANSLUMINAL CORONARY ANGIOPLASTY, HX OF 06/04/2009   Qualifier: Diagnosis of  By: Domenic Polite, MD, Phillips Hay   . Prostate CA ([place])    and basal cell nose    Past Surgical History:  Procedure Laterality Date  . CARDIAC CATHETERIZATION  1997  . EXCISION MASS NECK Left 11/12/2020   Procedure: EXCISION LEFT POSTERIOR NECK MASS;  Surgeon: Coralie Keens, MD;  Location: [facility];  Service: General;  Laterality: Left;  . EYE SURGERY Bilateral 2020   cataracts removed  . HERNIA REPAIR    . HIATAL HERNIA REPAIR    . LAPAROSCOPIC GASTRIC BANDING  0347    [facility] fundoplication  . MENISCUS REPAIR Left 2012  . ROBOT ASSISTED LAPAROSCOPIC RADICAL PROSTATECTOMY    . ROTATOR CUFF REPAIR Right 2019  . TOTAL KNEE ARTHROPLASTY Right 01/03/2021   Procedure: RIGHT TOTAL KNEE ARTHROPLASTY;  Surgeon: Mcarthur Rossetti, MD;  [facility];  Service: Orthopedics;  Laterality: Right;  Needs RNFA    There were no vitals filed for this visit.   Subjective Assessment - 02/06/21 1104    Subjective COVID-19 screen performed prior to patient entering clinic.  Doing well.    Pertinent History CTS, COPD, HTN, Hernia repair, left knee ATS, right RCR.    How long can you walk comfortably? Around house.    Patient Stated Goals Return to an active lifestyle.    Currently in Pain? Yes    Pain Score 4     Pain Location Knee    Pain Orientation Right    Pain Descriptors / Indicators Aching;Throbbing    Pain Type Surgical pain    Pain Onset More than a month ago                             [facility] Adult PT Treatment/Exercise - 02/06/21 0001      Knee/Hip Exercises: Aerobic   Recumbent Bike Level 4 x 15 minutes.      Knee/Hip Exercises: Machines  for Strengthening   Cybex Knee Extension 10# x 3 minutes.    Cybex Knee Flexion 40# x 3 minutes.    Cybex Leg Press 3 plates x 3 minutes.      Vasopneumatic   Number Minutes Vasopneumatic  20 minutes    Vasopnuematic Location  --   Right knee.   Vasopneumatic Pressure Medium                       PT Long Term Goals - 01/28/21 8938      PT LONG TERM GOAL #1   Title Independent with a HEP.    Time 6    Period Weeks    Status On-going      PT LONG TERM GOAL #2   Title Full right active knee extension in order to normalize gait.    Baseline AROM -8 degrees 01/28/21    Time 6    Period Weeks    Status On-going      PT LONG TERM GOAL #3   Title Active right knee flexion to 120 to 125 degrees+ so the patient can perform functional tasks and do so with pain not > 2-3/10.     Baseline AROM 116 degrees 01/28/21    Time 6    Period Weeks    Status On-going      PT LONG TERM GOAL #4   Title Perform a reciprocating stair gait with one railing with pain not > 2-3/10.    Time 6    Period Weeks    Status On-going                 Plan - 02/06/21 1107    Clinical Impression Statement Patient doing great. Expected to meet all goals.  Progress with strengthening.    Personal Factors and Comorbidities Comorbidity 1;Comorbidity 2;Other    Comorbidities CTS, COPD, HTN, Hernia repair, left knee ATS, right RCR.    Examination-Activity Limitations Other;Locomotion Level    Examination-Participation Restrictions Other    Stability/Clinical Decision Making Stable/Uncomplicated    Rehab Potential Excellent    PT Frequency 2x / week    PT Duration 6 weeks    PT Treatment/Interventions ADLs/Self Care Home Management;Cryotherapy;Electrical Stimulation;Gait training;Stair training;Functional mobility training;Therapeutic activities;Therapeutic exercise;Neuromuscular re-education;Manual techniques;Patient/family education;Passive range of motion;Vasopneumatic Device    PT Next Visit Plan cont with ROM and PRE's VASO PRN    Consulted and Agree with Plan of Care Patient           Patient will benefit from skilled therapeutic intervention in order to improve the following deficits and impairments:  Abnormal gait,Decreased activity tolerance,Decreased range of motion,Decreased strength,Increased edema,Pain  Visit Diagnosis: Chronic pain of right knee  Stiffness of right knee, not elsewhere classified  Localized edema     Problem List Patient Active Problem List   Diagnosis Date Noted  . Status post total right knee replacement 01/03/2021  . Left lower quadrant pain 09/25/2020  . Diverticular disease of colon 09/25/2020  . Colon cancer screening 09/25/2020  . Statin intolerance 12/27/2018  . BMI 28.0-28.9,adult 11/01/2018  . H/O arthroscopy of shoulder  02/10/2018  . Primary osteoarthritis of left knee 11/25/2016  . Primary osteoarthritis of right knee 11/25/2016  . Essential hypertension 06/28/2015  . H/O prostate cancer 06/28/2015  . Hereditary and idiopathic peripheral neuropathy 04/17/2015  . Carpal tunnel syndrome 04/17/2015  . Hyperlipemia 06/02/2013  . Asthma, chronic 06/02/2013  . Depression 06/02/2013  . GERD 06/04/2009  . LEG  PAIN, BILATERAL 06/04/2009  . PERCUTANEOUS TRANSLUMINAL CORONARY ANGIOPLASTY, HX OF 06/04/2009    Saiquan Hands, [place] MPT 02/06/2021, 11:09 AM  [facility] [address] [place], [place], [address] Phone: [phone]   Fax:  [phone]  Name: AERIK POLAN MRN: [id number] Date of Birth: 04/16/1946

## 2021-02-06 NOTE — Telephone Encounter (Signed)
Attempted Ortho bundle 30 day call to patient. No answer. Left VM requesting call back.

## 2021-02-07 ENCOUNTER — Telehealth: Payer: Self-pay | Admitting: *Deleted

## 2021-02-07 NOTE — Telephone Encounter (Signed)
30 day Ortho bundle call and survey completed. 

## 2021-02-11 ENCOUNTER — Other Ambulatory Visit: Payer: Self-pay | Admitting: Orthopaedic Surgery

## 2021-02-12 ENCOUNTER — Ambulatory Visit: Payer: Medicare Other | Attending: Orthopaedic Surgery | Admitting: *Deleted

## 2021-02-12 ENCOUNTER — Other Ambulatory Visit: Payer: Self-pay

## 2021-02-12 DIAGNOSIS — M25661 Stiffness of right knee, not elsewhere classified: Secondary | ICD-10-CM | POA: Diagnosis not present

## 2021-02-12 DIAGNOSIS — R600 Localized edema: Secondary | ICD-10-CM

## 2021-02-12 DIAGNOSIS — M25561 Pain in right knee: Secondary | ICD-10-CM | POA: Diagnosis not present

## 2021-02-12 DIAGNOSIS — G8929 Other chronic pain: Secondary | ICD-10-CM | POA: Diagnosis not present

## 2021-02-12 NOTE — Therapy (Signed)
[facility] [place], [place], [address] Phone: [phone]   Fax:  [phone]  Physical Therapy Treatment  Patient Details  Name: [name] [name] MRN: [id number] Date of Birth: 05-09-1946 Referring Provider (PT): Jean Rosenthal MD   Encounter Date: 02/12/2021   PT End of Session - 02/12/21 0956    Visit Number 8    Number of Visits 12    Date for PT Re-Evaluation 04/17/21    Authorization Type FOTO AT LEAST EVERY 5TH VISIT.  PROGRESS NOTE AT 10TH VISIT.  KX MODIFIER AFTER 15 VISITS.    PT Start Time 0945    PT Stop Time 1036    PT Time Calculation (min) 51 min           Past Medical History:  Diagnosis Date  . Asthma, chronic 06/02/2013  . Carpal tunnel syndrome 04/17/2015   Bilateral  . COPD (chronic obstructive pulmonary disease) ([place])   . Depression 06/02/2013  . DYSPNEA ON EXERTION 06/04/2009   Qualifier: Diagnosis of  By: Domenic Polite, MD, Phillips Hay   . GERD (gastroesophageal reflux disease)   . Hereditary and idiopathic peripheral neuropathy 04/17/2015  . Hyperlipemia 06/02/2013  . Hypertension   . LEG PAIN, BILATERAL 06/04/2009   Qualifier: Diagnosis of  By: Domenic Polite, MD, Phillips Hay   . PERCUTANEOUS TRANSLUMINAL CORONARY ANGIOPLASTY, HX OF 06/04/2009   Qualifier: Diagnosis of  By: Domenic Polite, MD, Phillips Hay   . Prostate CA ([place])    and basal cell nose    Past Surgical History:  Procedure Laterality Date  . CARDIAC CATHETERIZATION  1997  . EXCISION MASS NECK Left 11/12/2020   Procedure: EXCISION LEFT POSTERIOR NECK MASS;  Surgeon: Coralie Keens, MD;  Location: [facility];  Service: General;  Laterality: Left;  . EYE SURGERY Bilateral 2020   cataracts removed  . HERNIA REPAIR    . HIATAL HERNIA REPAIR    . LAPAROSCOPIC GASTRIC BANDING  4081   [facility] fundoplication  . MENISCUS REPAIR Left 2012  . ROBOT ASSISTED LAPAROSCOPIC RADICAL PROSTATECTOMY    . ROTATOR  CUFF REPAIR Right 2019  . TOTAL KNEE ARTHROPLASTY Right 01/03/2021   Procedure: RIGHT TOTAL KNEE ARTHROPLASTY;  Surgeon: Mcarthur Rossetti, MD;  Location: [facility];  Service: Orthopedics;  Laterality: Right;  Needs RNFA    There were no vitals filed for this visit.   Subjective Assessment - 02/12/21 0953    Subjective COVID-19 screen performed prior to patient entering clinic.  Doing well. To MD tomorrow    Pertinent History CTS, COPD, HTN, Hernia repair, left knee ATS, right RCR.    How long can you walk comfortably? Around house.    Patient Stated Goals Return to an active lifestyle.    Currently in Pain? Yes    Pain Score 4     Pain Orientation Right    Pain Descriptors / Indicators Aching;Sore    Pain Type Surgical pain    Pain Onset More than a month ago                             [facility] Adult PT Treatment/Exercise - 02/12/21 0001      Knee/Hip Exercises: Aerobic   Recumbent Bike Level 4 x 15 minutes.      Knee/Hip Exercises: Machines for Strengthening   Cybex Knee Extension 30# x 3 minutes.    Cybex Knee Flexion 40# x 3 minutes.  Cybex Leg Press 3 plates x 3 minutes.      Knee/Hip Exercises: Standing   SLS SLS RT LE x 3 mins total      Vasopneumatic   Number Minutes Vasopneumatic  10 minutes    Vasopnuematic Location  Knee    Vasopneumatic Pressure Medium    Vasopneumatic Temperature  34 for edema      Manual Therapy   Manual Therapy Passive ROM    Manual therapy comments 5-130 gegrees    Passive ROM manual PROM to right knee for  ext with holds to improve mobility                       PT Long Term Goals - 01/28/21 7782      PT LONG TERM GOAL #1   Title Independent with a HEP.    Time 6    Period Weeks    Status On-going      PT LONG TERM GOAL #2   Title Full right active knee extension in order to normalize gait.    Baseline AROM -8 degrees 01/28/21    Time 6    Period Weeks    Status On-going      PT LONG TERM  GOAL #3   Title Active right knee flexion to 120 to 125 degrees+ so the patient can perform functional tasks and do so with pain not > 2-3/10.    Baseline AROM 116 degrees 01/28/21    Time 6    Period Weeks    Status On-going      PT LONG TERM GOAL #4   Title Perform a reciprocating stair gait with one railing with pain not > 2-3/10.    Time 6    Period Weeks    Status On-going                 Plan - 02/12/21 0957    Clinical Impression Statement Pt arrived today doing well and will f/u with MD tomorrow. Pt did well with strengthening and balance exs f/b PROM for 5-130 degrees today .  Normal Vaso response today. To MD tomorrow.    Personal Factors and Comorbidities Comorbidity 1;Comorbidity 2;Other    Comorbidities CTS, COPD, HTN, Hernia repair, left knee ATS, right RCR.    Rehab Potential Excellent    PT Frequency 2x / week    PT Duration 6 weeks    PT Treatment/Interventions ADLs/Self Care Home Management;Cryotherapy;Electrical Stimulation;Gait training;Stair training;Functional mobility training;Therapeutic activities;Therapeutic exercise;Neuromuscular re-education;Manual techniques;Patient/family education;Passive range of motion;Vasopneumatic Device    PT Next Visit Plan cont with ROM and PRE's VASO PRN     Send MD note           Patient will benefit from skilled therapeutic intervention in order to improve the following deficits and impairments:  Abnormal gait,Decreased activity tolerance,Decreased range of motion,Decreased strength,Increased edema,Pain  Visit Diagnosis: Chronic pain of right knee  Stiffness of right knee, not elsewhere classified  Localized edema     Problem List Patient Active Problem List   Diagnosis Date Noted  . Status post total right knee replacement 01/03/2021  . Left lower quadrant pain 09/25/2020  . Diverticular disease of colon 09/25/2020  . Colon cancer screening 09/25/2020  . Statin intolerance 12/27/2018  . BMI  28.0-28.9,adult 11/01/2018  . H/O arthroscopy of shoulder 02/10/2018  . Primary osteoarthritis of left knee 11/25/2016  . Primary osteoarthritis of right knee 11/25/2016  . Essential hypertension 06/28/2015  . H/O  prostate cancer 06/28/2015  . Hereditary and idiopathic peripheral neuropathy 04/17/2015  . Carpal tunnel syndrome 04/17/2015  . Hyperlipemia 06/02/2013  . Asthma, chronic 06/02/2013  . Depression 06/02/2013  . GERD 06/04/2009  . LEG PAIN, BILATERAL 06/04/2009  . PERCUTANEOUS TRANSLUMINAL CORONARY ANGIOPLASTY, HX OF 06/04/2009    Fay Bagg,CHRIS, PTA 02/12/2021, 4:16 PM  [facility] [place], [place], [address] Phone: [phone]   Fax:  [phone]  Name: [name] [name] MRN: [id number] Date of Birth: December 11, 1945

## 2021-02-13 ENCOUNTER — Encounter: Payer: Self-pay | Admitting: Orthopaedic Surgery

## 2021-02-13 ENCOUNTER — Ambulatory Visit (INDEPENDENT_AMBULATORY_CARE_PROVIDER_SITE_OTHER): Payer: Medicare Other | Admitting: Orthopaedic Surgery

## 2021-02-13 ENCOUNTER — Telehealth: Payer: Self-pay | Admitting: *Deleted

## 2021-02-13 DIAGNOSIS — M1712 Unilateral primary osteoarthritis, left knee: Secondary | ICD-10-CM

## 2021-02-13 DIAGNOSIS — Z96651 Presence of right artificial knee joint: Secondary | ICD-10-CM

## 2021-02-13 NOTE — Progress Notes (Signed)
The patient is now 6-week status post a right total knee arthroplasty.  He has known end-stage arthritis of his left knee.  He is done very well with outpatient physical therapy.  His notes attested this as well.  On exam today his extension is almost full and his flexion is to past 120 degrees.  His right knee is ligament stable.  There is still some swelling which is to be expected.  His right knee is doing well overall.  His left knee does have severe end-stage arthritis and presents itself on clinical exam as well and has x-ray findings showing severe end-stage arthritis.  He has tried and failed all forms of treatment with that knee and is interested in proceeding with left total knee arthroplasty later this year.  I will see him back in 4 weeks for repeat exam but no x-rays are needed.  At that point we will work on scheduling him for a left total knee arthroplasty and August.

## 2021-02-13 NOTE — Telephone Encounter (Signed)
Ortho bundle in office meeting today.Patient doing extremely well per Dr. Ninfa Linden at 6 week post op. F/U on 03/13/21. Opposite knee scheduled for TKR in early August. CM called and left VM at [facility] therapy location in [place] to cancel further therapy sessions.

## 2021-02-14 ENCOUNTER — Encounter: Payer: Medicare Other | Admitting: *Deleted

## 2021-02-18 ENCOUNTER — Encounter: Payer: Medicare Other | Admitting: Physical Therapy

## 2021-02-21 ENCOUNTER — Encounter: Payer: Medicare Other | Admitting: *Deleted

## 2021-03-04 ENCOUNTER — Other Ambulatory Visit: Payer: Self-pay | Admitting: Nurse Practitioner

## 2021-03-04 ENCOUNTER — Other Ambulatory Visit: Payer: Self-pay

## 2021-03-04 DIAGNOSIS — I10 Essential (primary) hypertension: Secondary | ICD-10-CM

## 2021-03-12 DIAGNOSIS — L820 Inflamed seborrheic keratosis: Secondary | ICD-10-CM | POA: Diagnosis not present

## 2021-03-12 DIAGNOSIS — L570 Actinic keratosis: Secondary | ICD-10-CM | POA: Diagnosis not present

## 2021-03-12 IMAGING — MR MR LUMBAR SPINE W/O CM
4 of 6 series · 25 of 48 positions shown · non-contrast
Comparison: MRI lumbar spine 02/28/2007

CLINICAL DATA: Low back pain

EXAM:
MRI LUMBAR SPINE WITHOUT CONTRAST
TECHNIQUE: Multiplanar, multisequence MR imaging of the lumbar spine was
performed. No intravenous contrast was administered.

[Series 5: T2 post-contrast · sagittal · 4.0mm · 0.55mm/px · 5 of 13 slices shown]
[im 1/13]
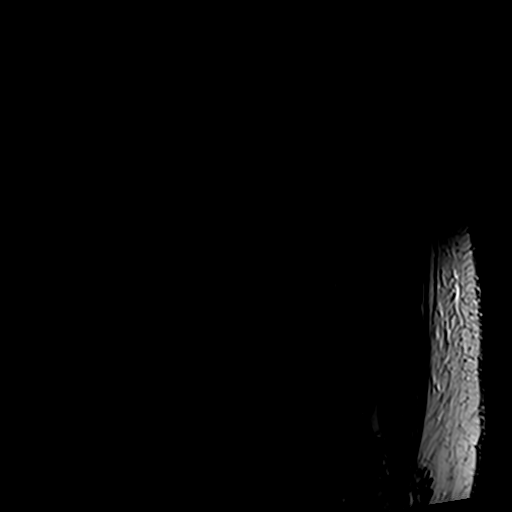
[im 4/13]
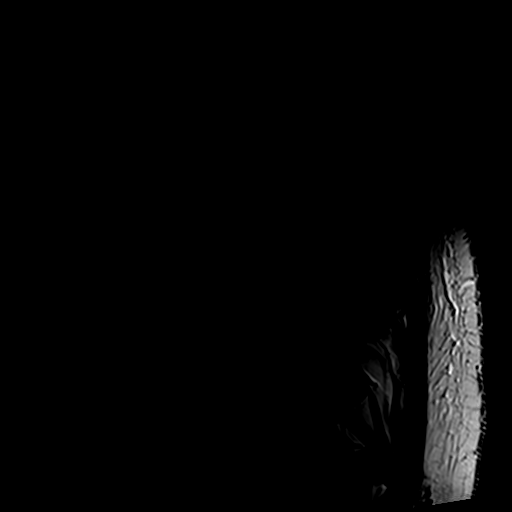
[im 7/13]
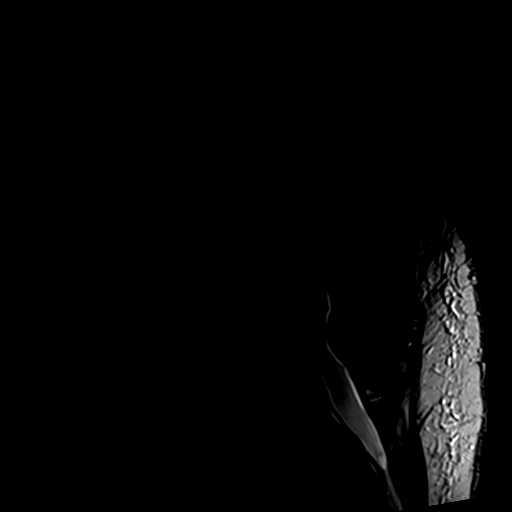
[im 10/13]
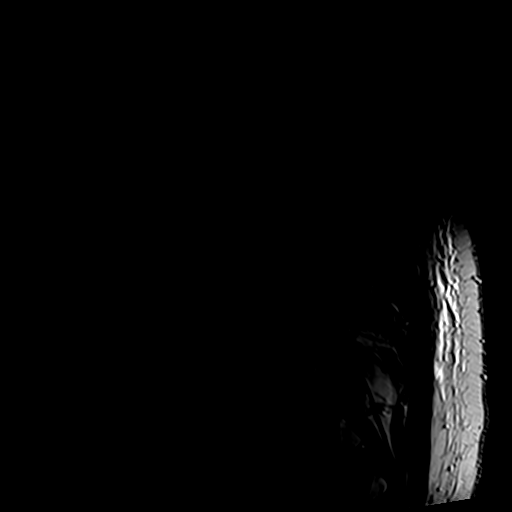
[im 13/13]
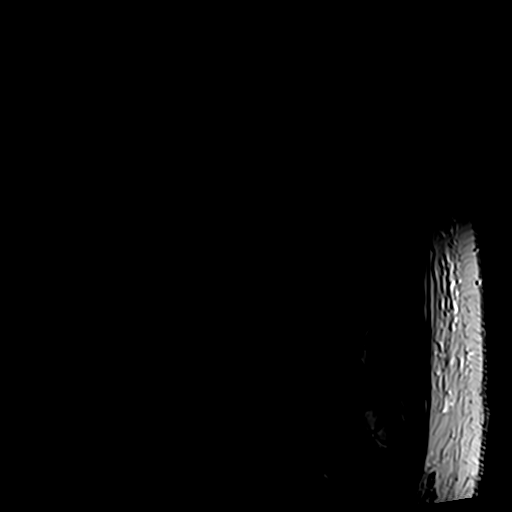

[Series 6: T1 · sagittal · 4.0mm · 0.55mm/px · 5 of 13 slices shown (1 of 2)]
[im 1/13]
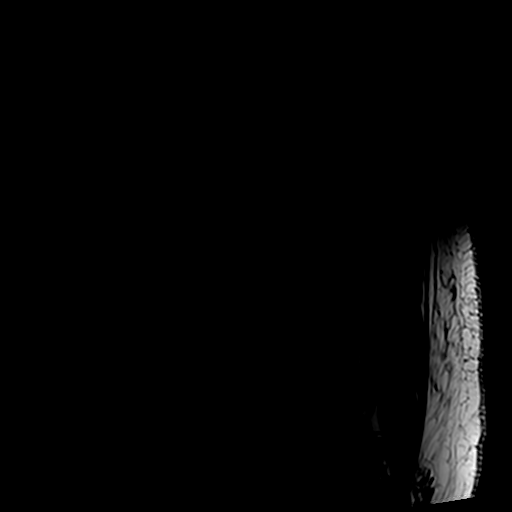
[im 4/13]
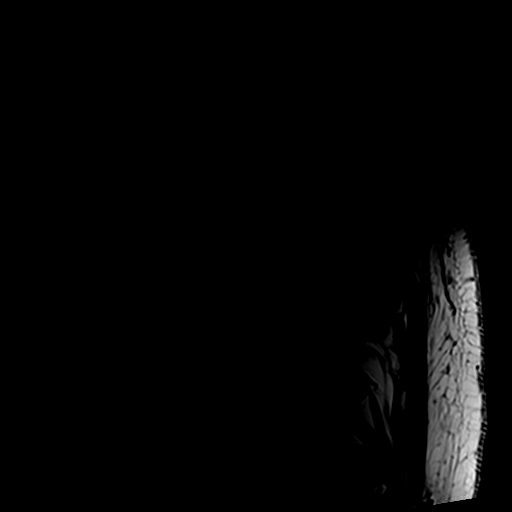
[im 7/13]
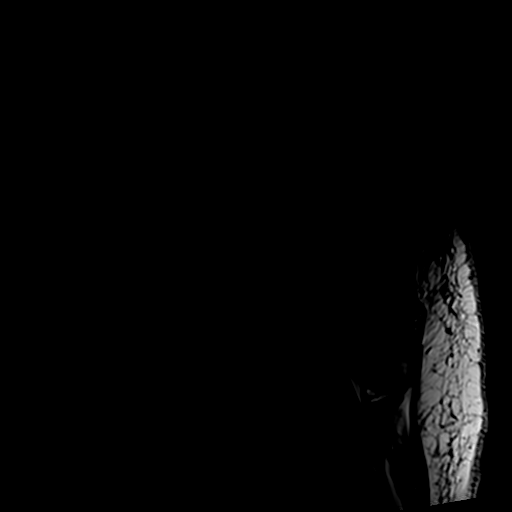
[im 10/13]
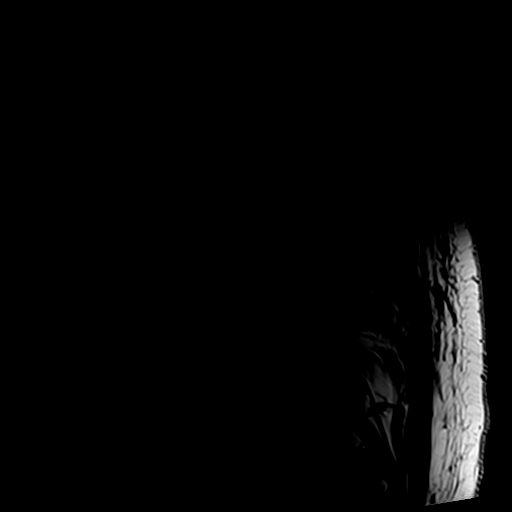
[im 13/13]
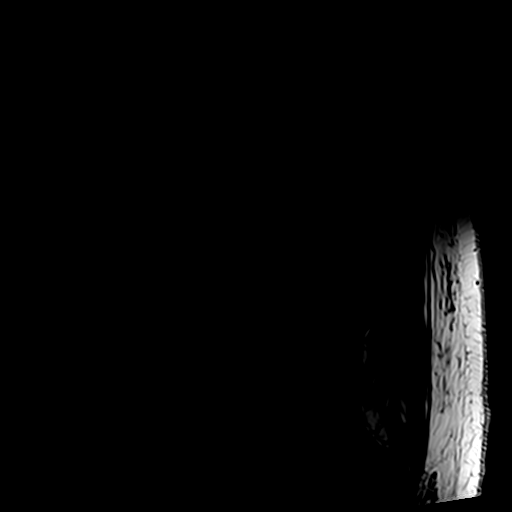

[Series 7: T2 · axial · 4.0mm · 0.70mm/px · z∈[-22,+199]mm · 9 of 40 slices shown]
[im 1/40]
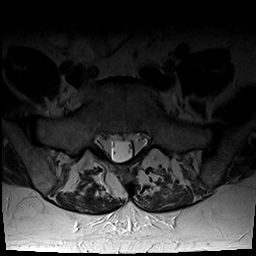
[im 6/40]
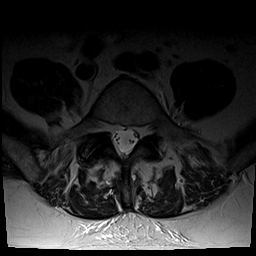
[im 12/40]
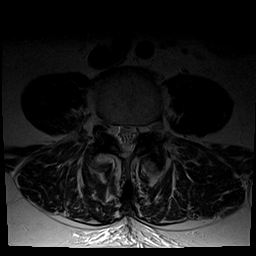
[im 17/40]
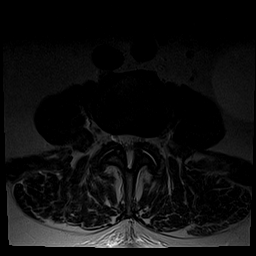
[im 20/40]
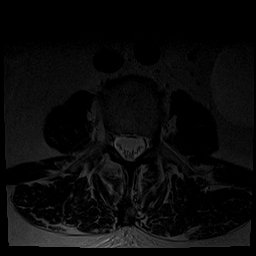
[im 23/40]
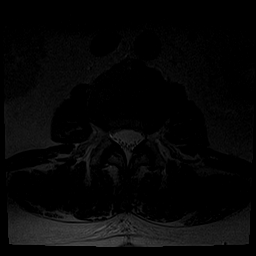
[im 28/40]
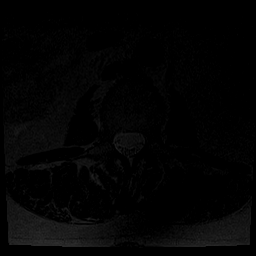
[im 34/40]
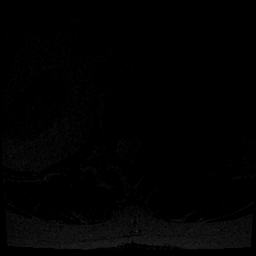
[im 40/40]
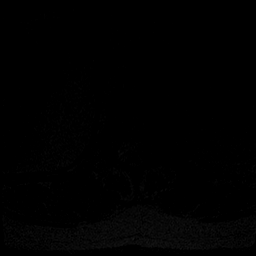

[Series 8: T1 · axial · 4.0mm · 0.35mm/px · z∈[-22,+168]mm · 6 of 40 slices shown (2 of 2)]
[im 1/40]
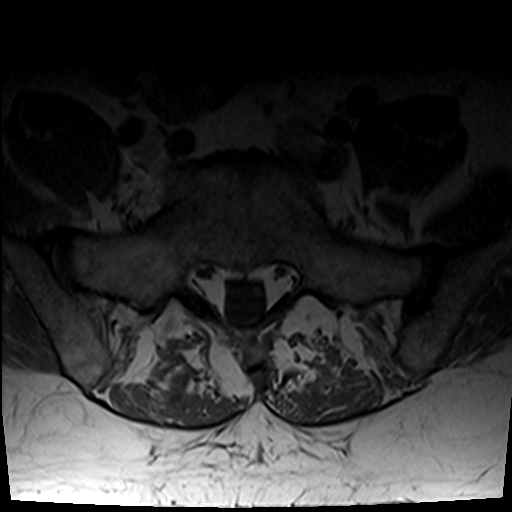
[im 6/40]
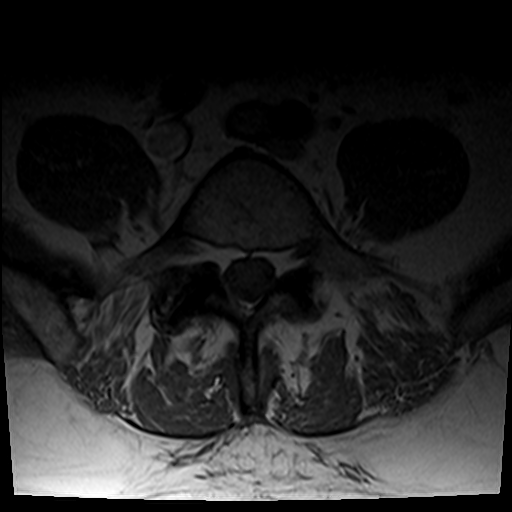
[im 12/40]
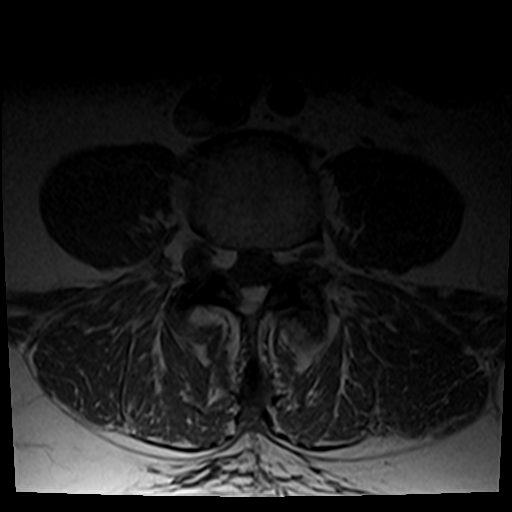
[im 17/40]
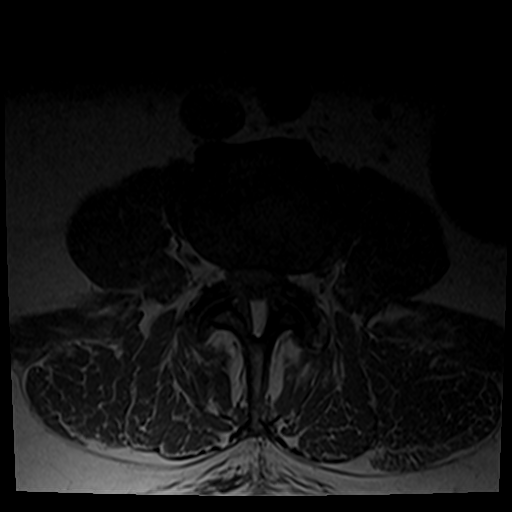
[im 20/40]
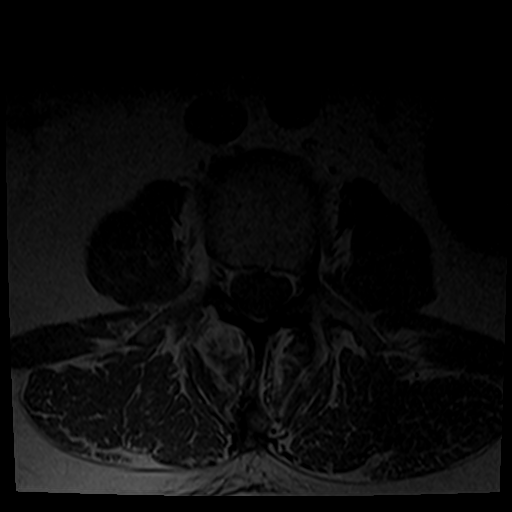
[im 34/40]
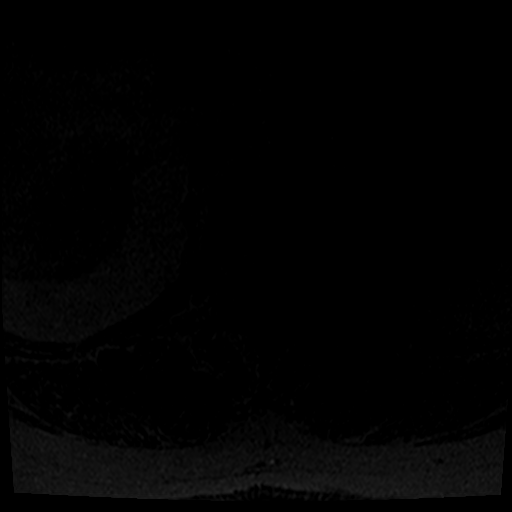

[25 of 48 positions shown; findings below may reference images not displayed]

FINDINGS: Segmentation:  Normal

Alignment: Mild retrolisthesis L1-2 and L2-3. Mild anterolisthesis
L4-5.

Vertebrae:  Normal bone marrow.  Negative for fracture or mass.

Conus medullaris and cauda equina: Conus extends to the L1 level.
Conus and cauda equina appear normal.

Paraspinal and other soft tissues: Negative for paraspinous mass or
adenopathy. Left renal cyst.

Disc levels:

L1-2: Mild disc degeneration and Schmorl's node. Negative for
stenosis

L2-3: Mild disc bulging and mild facet degeneration. Negative for
disc protrusion or stenosis.

L3-4: Mild disc degeneration and disc bulging. Left lateral
osteophyte formation unchanged from prior study. Moderate facet
degeneration has progressed in the interval. Mild subarticular
stenosis on the left has progressed. Spinal canal adequate in size.

L4-5: 3 mm anterolisthesis. Severe facet degeneration which has
progressed significantly in the interval. Moderate spinal stenosis
with progression. Neural foramina patent bilaterally.

L5-S1: Disc degeneration and spurring asymmetric to the right.
Moderate facet hypertrophy bilaterally. Moderate subarticular and
foraminal stenosis on the right due to spurring. This has progressed
significantly in the interval.
IMPRESSION: Moderate spinal stenosis L4-5, with progression since [DATE] mm
anterolisthesis with severe facet degeneration which has progressed
significantly since 9998

Moderate subarticular and foraminal stenosis on the right due to
spurring which has progressed significantly since [DATE].

## 2021-03-13 ENCOUNTER — Ambulatory Visit (INDEPENDENT_AMBULATORY_CARE_PROVIDER_SITE_OTHER): Payer: Medicare Other | Admitting: Orthopaedic Surgery

## 2021-03-13 ENCOUNTER — Encounter: Payer: Self-pay | Admitting: Orthopaedic Surgery

## 2021-03-13 DIAGNOSIS — Z96651 Presence of right artificial knee joint: Secondary | ICD-10-CM

## 2021-03-13 DIAGNOSIS — M1712 Unilateral primary osteoarthritis, left knee: Secondary | ICD-10-CM

## 2021-03-13 NOTE — Progress Notes (Signed)
[name] [name] is now over 2 months status post a right total knee arthroplasty.  That knee has done very well for him.  He has severe end-stage arthritis of his left knee this been well-documented.  He is dealt with this left knee pain hurting for well over a year or more and has tried and failed conservative treatment for now over 12 months with the left knee with multiple steroid injections over time and hyaluronic acid injections.  X-rays of his left knee shows severe bone-on-bone arthritis.  At this point we have him scheduled for knee replacement surgery in mid August for the left knee.  Examination of his right operative knee shows full flexion and full extension.  It is ligamentously stable.  Examination left knee shows varus malalignment and significant patellofemoral rotation with medial lateral joint line tenderness.  At this point we will proceed with a left total knee arthroplasty in August to treat the severe end-stage arthritis of his left knee.  All questions and concerns were answered and addressed.

## 2021-03-19 ENCOUNTER — Ambulatory Visit (INDEPENDENT_AMBULATORY_CARE_PROVIDER_SITE_OTHER): Payer: Medicare Other | Admitting: Nurse Practitioner

## 2021-03-19 ENCOUNTER — Encounter: Payer: Self-pay | Admitting: Nurse Practitioner

## 2021-03-19 DIAGNOSIS — U071 COVID-19: Secondary | ICD-10-CM | POA: Diagnosis not present

## 2021-03-19 DIAGNOSIS — J069 Acute upper respiratory infection, unspecified: Secondary | ICD-10-CM | POA: Diagnosis not present

## 2021-03-19 MED ORDER — DM-GUAIFENESIN ER 30-600 MG PO TB12: 1.0000 | ORAL_TABLET | Freq: Two times a day (BID) | ORAL | 0 refills | Status: DC

## 2021-03-19 MED ORDER — PREDNISONE 10 MG (21) PO TBPK: ORAL_TABLET | ORAL | 0 refills | Status: DC

## 2021-03-19 NOTE — Assessment & Plan Note (Signed)
Symptoms of congestion, cough, shortness of breath in the last 4 days multiple controlled.  Completed COVID 19 swab results pending. Guaifenesin for cough and congestion, prednisone taper.  Increase hydration.

## 2021-03-19 NOTE — Progress Notes (Signed)
   Virtual Visit  Note Due to COVID-19 pandemic this visit was conducted virtually. This visit type was conducted due to national recommendations for restrictions regarding the COVID-19 Pandemic (e.g. social distancing, sheltering in place) in an effort to limit this patient's exposure and mitigate transmission in our community. All issues noted in this document were discussed and addressed.  A physical exam was not performed with this format.  I connected with [name] [name] on 03/19/21 at 9:30 AM by telephone and verified that I am speaking with the correct person using two identifiers. [name] [name] is currently located at home during visit. The provider, Ivy Lynn, NP is located in their office at time of visit.  I discussed the limitations, risks, security and privacy concerns of performing an evaluation and management service by telephone and the availability of in person appointments. I also discussed with the patient that there may be a patient responsible charge related to this service. The patient expressed understanding and agreed to proceed.   History and Present Illness:  URI  This is a new problem. Episode onset: The past 4 days. The problem has been gradually worsening. There has been no fever. Associated symptoms include congestion and coughing. Pertinent negatives include no abdominal pain, chest pain, ear pain, headaches, joint swelling, nausea, neck pain, rash, sore throat or swollen glands. He has tried nothing for the symptoms.     Review of Systems  HENT:  Positive for congestion. Negative for ear pain and sore throat.   Respiratory:  Positive for cough.   Cardiovascular:  Negative for chest pain.  Gastrointestinal:  Negative for abdominal pain and nausea.  Musculoskeletal:  Negative for neck pain.  Skin:  Negative for rash.  Neurological:  Negative for headaches.  All other systems reviewed and are negative.   Observations/Objective: Televisit patient not in  distress.  Assessment and Plan: Symptoms of congestion, cough, shortness of breath in the last 4 days multiple controlled.  Completed COVID 19 swab results pending. Guaifenesin for cough and congestion, prednisone taper.  Increase hydration.  Follow Up Instructions: Follow-up with worsening unresolved symptoms.   I discussed the assessment and treatment plan with the patient. The patient was provided an opportunity to ask questions and all were answered. The patient agreed with the plan and demonstrated an understanding of the instructions.   The patient was advised to call back or seek an in-person evaluation if the symptoms worsen or if the condition fails to improve as anticipated.  The above assessment and management plan was discussed with the patient. The patient verbalized understanding of and has agreed to the management plan. Patient is aware to call the clinic if symptoms persist or worsen. Patient is aware when to return to the clinic for a follow-up visit. Patient educated on when it is appropriate to go to the emergency department.   Time call ended: 940  I provided 10 minutes of  non face-to-face time during this encounter.    Ivy Lynn, NP

## 2021-03-20 LAB — SARS-COV-2, NAA 2 DAY TAT

## 2021-03-20 LAB — NOVEL CORONAVIRUS, NAA: SARS-CoV-2, NAA: DETECTED — AB

## 2021-03-21 ENCOUNTER — Encounter: Payer: Self-pay | Admitting: Nurse Practitioner

## 2021-03-21 DIAGNOSIS — U071 COVID-19: Secondary | ICD-10-CM | POA: Insufficient documentation

## 2021-03-21 MED ORDER — MOLNUPIRAVIR EUA 200MG CAPSULE: 4.0000 | ORAL_CAPSULE | Freq: Two times a day (BID) | ORAL | 0 refills | Status: AC

## 2021-03-21 NOTE — Addendum Note (Signed)
Addended by: Ivy Lynn on: 03/21/2021 11:41 PM   Modules accepted: Orders

## 2021-04-03 ENCOUNTER — Other Ambulatory Visit: Payer: Self-pay | Admitting: Nurse Practitioner

## 2021-04-11 ENCOUNTER — Ambulatory Visit: Payer: Self-pay | Admitting: Nurse Practitioner

## 2021-04-14 NOTE — Progress Notes (Signed)
Sent message, via epic in basket, requesting orders in epic from surgeon.  

## 2021-04-16 NOTE — Progress Notes (Signed)
Need orders in epic.  Preop on 04/18/2021.

## 2021-04-17 ENCOUNTER — Other Ambulatory Visit: Payer: Self-pay | Admitting: Nurse Practitioner

## 2021-04-17 ENCOUNTER — Encounter: Payer: Self-pay | Admitting: Nurse Practitioner

## 2021-04-17 DIAGNOSIS — J4520 Mild intermittent asthma, uncomplicated: Secondary | ICD-10-CM

## 2021-04-17 DIAGNOSIS — F3342 Major depressive disorder, recurrent, in full remission: Secondary | ICD-10-CM

## 2021-04-17 DIAGNOSIS — I10 Essential (primary) hypertension: Secondary | ICD-10-CM

## 2021-04-18 ENCOUNTER — Other Ambulatory Visit: Payer: Self-pay

## 2021-04-18 ENCOUNTER — Encounter (HOSPITAL_COMMUNITY)
Admission: RE | Admit: 2021-04-18 | Discharge: 2021-04-18 | Disposition: A | Discharge status: Home / Self Care | Payer: Medicare Other | Source: Ambulatory Visit | Attending: Orthopaedic Surgery | Admitting: Orthopaedic Surgery

## 2021-04-18 ENCOUNTER — Other Ambulatory Visit: Payer: Self-pay | Admitting: Physician Assistant

## 2021-04-18 ENCOUNTER — Encounter (HOSPITAL_COMMUNITY): Payer: Self-pay

## 2021-04-18 DIAGNOSIS — Z01812 Encounter for preprocedural laboratory examination: Secondary | ICD-10-CM | POA: Insufficient documentation

## 2021-04-18 HISTORY — DX: Unspecified osteoarthritis, unspecified site: M19.90

## 2021-04-18 LAB — BASIC METABOLIC PANEL
Anion gap: 8 (ref 5–15)
BUN: 18 mg/dL (ref 8–23)
CO2: 26 mmol/L (ref 22–32)
Calcium: 9.6 mg/dL (ref 8.9–10.3)
Chloride: 103 mmol/L (ref 98–111)
Creatinine, Ser: 1.36 mg/dL — ABNORMAL HIGH (ref 0.61–1.24)
GFR, Estimated: 55 mL/min — ABNORMAL LOW (ref >60)
Glucose, Bld: 137 mg/dL — ABNORMAL HIGH (ref 70–99)
Potassium: 4.5 mmol/L (ref 3.5–5.1)
Sodium: 137 mmol/L (ref 135–145)

## 2021-04-18 LAB — CBC
HCT: 44.9 % (ref 39.0–52.0)
Hemoglobin: 14.4 g/dL (ref 13.0–17.0)
MCH: 30.5 pg (ref 26.0–34.0)
MCHC: 32.1 g/dL (ref 30.0–36.0)
MCV: 95.1 fL (ref 80.0–100.0)
Platelets: 235 10*3/uL (ref 150–400)
RBC: 4.72 MIL/uL (ref 4.22–5.81)
RDW: 13.4 % (ref 11.5–15.5)
WBC: 9 10*3/uL (ref 4.0–10.5)
nRBC: 0 % (ref 0.0–0.2)

## 2021-04-18 LAB — SURGICAL PCR SCREEN
MRSA, PCR: NEGATIVE
Staphylococcus aureus: NEGATIVE

## 2021-04-18 NOTE — Progress Notes (Signed)
DUE TO COVID-19 ONLY ONE VISITOR IS ALLOWED TO COME WITH YOU AND STAY IN THE WAITING ROOM ONLY DURING PRE OP AND PROCEDURE DAY OF SURGERY. THE 1 VISITOR  MAY VISIT WITH YOU AFTER SURGERY IN YOUR PRIVATE ROOM DURING VISITING HOURS ONLY!  YOU NEED TO HAVE A COVID 19 TEST ON_______ '@_______'$ , THIS TEST MUST BE DONE BEFORE SURGERY,  COVID TESTING SITE [address] [place] [address], IT IS ON THE RIGHT GOING OUT [address] APPROXIMATELY  2 MINUTES PAST [employer] ON THE RIGHT. ONCE YOUR COVID TEST IS COMPLETED,  PLEASE BEGIN THE QUARANTINE INSTRUCTIONS AS OUTLINED IN YOUR HANDOUT.                [name] [name]  04/18/2021   Your procedure is scheduled on:       04/25/2021   Report to [facility] Main  Entrance   Report to admitting at   [place]  AM     Call this number if you have problems the morning of surgery [phone]    Remember: Do not eat food , candy gum or mints :After Midnight. You may have clear liquids from midnight until   0915AM    CLEAR LIQUID DIET   Foods Allowed                                                                       Coffee and tea, regular and decaf                              Plain Jell-O any favor except red or purple                                            Fruit ices (not with fruit pulp)                                      Iced Popsicles                                     Carbonated beverages, regular and diet                                    Cranberry, grape and apple juices Sports drinks like Gatorade Lightly seasoned clear broth or consume(fat free) Sugar, honey syrup   _____________________________________________________________________    BRUSH YOUR TEETH MORNING OF SURGERY AND RINSE YOUR MOUTH OUT, NO CHEWING GUM CANDY OR MINTS.     Take these medicines the morning of surgery with A SIP OF WATER:  AMLODIPINE, ATENOLOL, INHALERS AS USUAL, AND BRING, CELEXA   DO NOT TAKE ANY DIABETIC MEDICATIONS  DAY OF YOUR SURGERY  You may not have any metal on your body including hair pins and              piercings  Do not wear jewelry, make-up, lotions, powders or perfumes, deodorant             Do not wear nail polish on your fingernails.  Do not shave  48 hours prior to surgery.              Men may shave face and neck.   Do not bring valuables to the hospital. [place].  Contacts, dentures or bridgework may not be worn into surgery.  Leave suitcase in the car. After surgery it may be brought to your room.     Patients discharged the day of surgery will not be allowed to drive home. IF YOU ARE HAVING SURGERY AND GOING HOME THE SAME DAY, YOU MUST HAVE AN ADULT TO DRIVE YOU HOME AND BE WITH YOU FOR 24 HOURS. YOU MAY GO HOME BY TAXI OR UBER OR ORTHERWISE, BUT AN ADULT MUST ACCOMPANY YOU HOME AND STAY WITH YOU FOR 24 HOURS.  Name and phone number of your driver:  Special Instructions: N/A              Please read over the following fact sheets you were given: _____________________________________________________________________  [facility] - Preparing for Surgery Before surgery, you can play an important role.  Because skin is not sterile, your skin needs to be as free of germs as possible.  You can reduce the number of germs on your skin by washing with CHG (chlorahexidine gluconate) soap before surgery.  CHG is an antiseptic cleaner which kills germs and bonds with the skin to continue killing germs even after washing. Please DO NOT use if you have an allergy to CHG or antibacterial soaps.  If your skin becomes reddened/irritated stop using the CHG and inform your nurse when you arrive at Short Stay. Do not shave (including legs and underarms) for at least 48 hours prior to the first CHG shower.  You may shave your face/neck. Please follow these instructions carefully:  1.  Shower with CHG Soap the night before  surgery and the  morning of Surgery.  2.  If you choose to wash your hair, wash your hair first as usual with your  normal  shampoo.  3.  After you shampoo, rinse your hair and body thoroughly to remove the  shampoo.                           4.  Use CHG as you would any other liquid soap.  You can apply chg directly  to the skin and wash                       Gently with a scrungie or clean washcloth.  5.  Apply the CHG Soap to your body ONLY FROM THE NECK DOWN.   Do not use on face/ open                           Wound or open sores. Avoid contact with eyes, ears mouth and genitals (private parts).  Wash face,  Genitals (private parts) with your normal soap.             6.  Wash thoroughly, paying special attention to the area where your surgery  will be performed.  7.  Thoroughly rinse your body with warm water from the neck down.  8.  DO NOT shower/wash with your normal soap after using and rinsing off  the CHG Soap.                9.  Pat yourself dry with a clean towel.            10.  Wear clean pajamas.            11.  Place clean sheets on your bed the night of your first shower and do not  sleep with pets. Day of Surgery : Do not apply any lotions/deodorants the morning of surgery.  Please wear clean clothes to the hospital/surgery center.  FAILURE TO FOLLOW THESE INSTRUCTIONS MAY RESULT IN THE CANCELLATION OF YOUR SURGERY PATIENT SIGNATURE_________________________________  NURSE SIGNATURE__________________________________  ________________________________________________________________________

## 2021-04-18 NOTE — Progress Notes (Addendum)
Anesthesia Review:  PCP: Dr Chevis Pretty  LOV 03/19/21- DR Christiana Pellant  Cardiologist : DR Gwyndolyn Kaufman LOV 11/19/20  Chest x-ray : EKG :11/19/20  Echo : Stress test: 11/25/20/ Cardiac Cath :  Activity level: can do a flight of stairs without difficulty  Sleep Study/ CPAP : none  Fasting Blood Sugar :      / Checks Blood Sugar -- times a day:   Blood Thinner/ Instructions /Last Dose: ASA / Instructions/ Last Dose :   81 mg Aspirin  Positive for covid on 03/19/21 results on front of chart  ORders requested x 2 on 8/3 and 8/4.  No orders in at preop.

## 2021-04-24 ENCOUNTER — Telehealth: Payer: Self-pay | Admitting: *Deleted

## 2021-04-24 ENCOUNTER — Other Ambulatory Visit: Payer: Self-pay | Admitting: *Deleted

## 2021-04-24 DIAGNOSIS — M1712 Unilateral primary osteoarthritis, left knee: Secondary | ICD-10-CM

## 2021-04-24 NOTE — Care Plan (Signed)
[facility] RNCM call to patient to discuss his upcoming Left total knee replacement with Dr. Ninfa Linden. He is now recovered from his Right total knee replacement done on 01/03/21. He lives with his wife and plans to return home after discharge. He has all DME at home (FWW, 3in1/BSC). We discussed HHPT vs. Starting OPPT and he states that starting OPPT next week would be agreeable. Ordered and scheduled for Tuesday, 04/29/21 at 11:15 am. Reviewed post-op care instructions. Will continue to follow for needs.

## 2021-04-24 NOTE — Telephone Encounter (Signed)
Ortho bundle 90 day call completed. 

## 2021-04-24 NOTE — H&P (Signed)
TOTAL KNEE ADMISSION H&P  Patient is being admitted for left total knee arthroplasty.  Subjective:  Chief Complaint:left knee pain.  HPI: [name] [name], 75 y.o. male, has a history of pain and functional disability in the left knee due to arthritis and has failed non-surgical conservative treatments for greater than 12 weeks to includeNSAID's and/or analgesics, corticosteriod injections, viscosupplementation injections, flexibility and strengthening excercises, use of assistive devices, weight reduction as appropriate, and activity modification.  Onset of symptoms was gradual, starting 3 years ago with gradually worsening course since that time. The patient noted no past surgery on the left knee(s).  Patient currently rates pain in the left knee(s) at 9 out of 10 with activity. Patient has night pain, worsening of pain with activity and weight bearing, pain that interferes with activities of daily living, pain with passive range of motion, crepitus, and joint swelling.  Patient has evidence of subchondral sclerosis, periarticular osteophytes, and joint space narrowing by imaging studies. There is no active infection.  Patient Active Problem List   Diagnosis Date Noted   Real time reverse transcriptase PCR positive for COVID-19 virus 03/21/2021   Upper respiratory infection with cough and congestion 03/19/2021   Status post total right knee replacement 01/03/2021   Left lower quadrant pain 09/25/2020   Diverticular disease of colon 09/25/2020   Colon cancer screening 09/25/2020   Statin intolerance 12/27/2018   BMI 28.0-28.9,adult 11/01/2018   H/O arthroscopy of shoulder 02/10/2018   Primary osteoarthritis of left knee 11/25/2016   Primary osteoarthritis of right knee 11/25/2016   Essential hypertension 06/28/2015   H/O prostate cancer 06/28/2015   Hereditary and idiopathic peripheral neuropathy 04/17/2015   Carpal tunnel syndrome 04/17/2015   Hyperlipemia 06/02/2013   Asthma, chronic  06/02/2013   Depression 06/02/2013   GERD 06/04/2009   LEG PAIN, BILATERAL 06/04/2009   PERCUTANEOUS TRANSLUMINAL CORONARY ANGIOPLASTY, HX OF 06/04/2009   Past Medical History:  Diagnosis Date   Arthritis    Asthma, chronic 06/02/2013   Carpal tunnel syndrome 04/17/2015   Bilateral   Depression 06/02/2013   DYSPNEA ON EXERTION 06/04/2009   Qualifier: Diagnosis of  By: Domenic Polite, MD, [place] Hay    Hereditary and idiopathic peripheral neuropathy 04/17/2015   Hyperlipemia 06/02/2013   Hypertension    LEG PAIN, BILATERAL 06/04/2009   Qualifier: Diagnosis of  By: Domenic Polite, MD, FACC, [place], HX OF 06/04/2009   Qualifier: Diagnosis of  By: Domenic Polite, MD, Phillips Hay    Prostate CA [facility])    and basal cell nose    Past Surgical History:  Procedure Laterality Date   CARDIAC CATHETERIZATION  1997   EXCISION MASS NECK Left 11/12/2020   Procedure: EXCISION LEFT POSTERIOR NECK MASS;  Surgeon: Coralie Keens, MD;  Location: [place];  Service: General;  Laterality: Left;   EYE SURGERY Bilateral 2020   cataracts removed   [place] GASTRIC BANDING  [id number]   [facility] fundoplication   MENISCUS REPAIR Left 2012   ROBOT ASSISTED LAPAROSCOPIC RADICAL PROSTATECTOMY     ROTATOR CUFF REPAIR Right 2019   TOTAL KNEE ARTHROPLASTY Right 01/03/2021   Procedure: RIGHT TOTAL KNEE ARTHROPLASTY;  Surgeon: Mcarthur Rossetti, MD;  Location: [facility];  Service: Orthopedics;  Laterality: Right;  Needs RNFA    No current facility-administered medications for this encounter.   Current Outpatient Medications  Medication Sig Dispense Refill Last  Dose   aspirin 81 MG chewable tablet Chew 1 tablet (81 mg total) by mouth 2 (two) times daily. (Patient taking differently: Chew 81 mg by mouth daily.) 30 tablet 0    atenolol (TENORMIN) 25 MG tablet TAKE 1 TABLET DAILY (Patient  taking differently: Take 25 mg by mouth daily.) 90 tablet 0    budesonide-formoterol (SYMBICORT) 160-4.5 MCG/ACT inhaler Inhale 2 puffs into the lungs 2 (two) times daily. (Patient taking differently: Inhale 2 puffs into the lungs 2 (two) times daily as needed (shortness of breath).) 3 each 3    Cholecalciferol (VITAMIN D3) 25 MCG (1000 UT) CAPS Take 1,000 Units by mouth daily.      cyanocobalamin (,VITAMIN B-12,) 1000 MCG/ML injection Inject 1 mL (1,000 mcg total) into the muscle every 30 (thirty) days. 3 mL 0    dextromethorphan-guaiFENesin (MUCINEX DM) 30-600 MG 12hr tablet Take 1 tablet by mouth 2 (two) times daily. (Patient taking differently: Take 1 tablet by mouth daily as needed for cough.) 30 tablet 0    diclofenac Sodium (VOLTAREN) 1 % GEL Apply 1 application topically daily as needed (pain).      ezetimibe (ZETIA) 10 MG tablet Take 10 mg by mouth daily.      loperamide (IMODIUM A-D) 2 MG tablet Take 2 mg by mouth daily.      loratadine (CLARITIN) 10 MG tablet Take 10 mg by mouth daily as needed for allergies.      naproxen (NAPROSYN) 500 MG tablet Take 500 mg by mouth daily.      pravastatin (PRAVACHOL) 20 MG tablet Take 10 mg by mouth daily.      amLODipine (NORVASC) 5 MG tablet Take 1 tablet (5 mg total) by mouth daily. (NEEDS TO BE SEEN BEFORE NEXT REFILL) 30 tablet 0    citalopram (CELEXA) 20 MG tablet Take 1 tablet (20 mg total) by mouth daily. (NEEDS TO BE SEEN BEFORE NEXT REFILL) 30 tablet 0    HYDROcodone-acetaminophen (NORCO/VICODIN) 5-325 MG tablet Take 1-2 tablets by mouth every 6 (six) hours as needed for moderate pain. (Patient not taking: Reported on 04/14/2021) 20 tablet 0 Not Taking   icosapent Ethyl (VASCEPA) 1 g capsule Take 2 capsules (2 g total) by mouth 2 (two) times daily. (Patient not taking: Reported on 04/14/2021) 120 capsule 3 Not Taking   methocarbamol (ROBAXIN) 500 MG tablet TAKE ONE TABLET EVERY 6 HOURS AS NEEDED FOR MUSCLE SPASMS (Patient not taking: Reported on  04/14/2021) 40 tablet 1 Not Taking   montelukast (SINGULAIR) 10 MG tablet TAKE 1 TABLET DAILY 90 tablet 0    oxyCODONE (OXY IR/ROXICODONE) 5 MG immediate release tablet Take 1-2 tablets (5-10 mg total) by mouth every 6 (six) hours as needed for moderate pain (pain score 4-6). (Patient not taking: Reported on 04/14/2021) 30 tablet 0 Not Taking   predniSONE (STERAPRED UNI-PAK 21 TAB) 10 MG (21) TBPK tablet 6 tablet day 1, 5 tablet day 2, 4 tablet day 3, 3 tablet day 4, 2 tablet day 5, 1 tablet day 6 (Patient not taking: Reported on 04/14/2021) 1 each 0 Not Taking   traMADol (ULTRAM) 50 MG tablet Take 1 tablet (50 mg total) by mouth every 6 (six) hours as needed for moderate pain. (Patient not taking: Reported on 04/14/2021) 20 tablet 0 Not Taking   Allergies  Allergen Reactions   Fenofibrate Other (See Comments)    Pt reports causes joint pains   Rosuvastatin Other (See Comments)    Pt reports causes joint pains  Social History   Tobacco Use   Smoking status: Former    Packs/day: 1.00    Years: 20.00    Pack years: 20.00    Types: Cigarettes    Quit date: 01/18/1993    Years since quitting: 28.2   Smokeless tobacco: Never  Substance Use Topics   Alcohol use: Yes    Alcohol/week: 2.0 standard drinks    Types: 2 Cans of beer per week    Comment: occasional    Family History  Problem Relation Age of Onset   Heart disease Other        Early Cardiac disease for uncle is their 38's as well as deceased.   Heart failure Mother    Dementia Mother 21   Cancer Father 43       testicular, lung   Esophageal cancer Brother      Review of Systems  Musculoskeletal:  Positive for gait problem and joint swelling.  All other systems reviewed and are negative.  Objective:  Physical Exam Vitals reviewed.  Constitutional:      Appearance: Normal appearance.  HENT:     Head: Normocephalic and atraumatic.  Eyes:     Extraocular Movements: Extraocular movements intact.     Pupils: Pupils are  equal, round, and reactive to light.  Cardiovascular:     Pulses: Normal pulses.  Pulmonary:     Effort: Pulmonary effort is normal.     Breath sounds: Normal breath sounds.  Abdominal:     Palpations: Abdomen is soft.  Musculoskeletal:     Cervical back: Normal range of motion.     Left knee: Effusion, bony tenderness and crepitus present. Decreased range of motion. Tenderness present over the medial joint line, lateral joint line and patellar tendon. Abnormal alignment.  Neurological:     Mental Status: He is alert and oriented to person, place, and time.  Psychiatric:        Behavior: Behavior normal.    Vital signs in last 24 hours:    Labs:   Estimated body mass index is 32.09 kg/m as calculated from the following:   Height as of 04/18/21: 6' 2.6" (1.895 m).   Weight as of 04/18/21: 115.2 kg.   Imaging Review Plain radiographs demonstrate severe degenerative joint disease of the left knee(s). The overall alignment isneutral. The bone quality appears to be excellent for age and reported activity level.      Assessment/Plan:  End stage arthritis, left knee   The patient history, physical examination, clinical judgment of the provider and imaging studies are consistent with end stage degenerative joint disease of the left knee(s) and total knee arthroplasty is deemed medically necessary. The treatment options including medical management, injection therapy arthroscopy and arthroplasty were discussed at length. The risks and benefits of total knee arthroplasty were presented and reviewed. The risks due to aseptic loosening, infection, stiffness, patella tracking problems, thromboembolic complications and other imponderables were discussed. The patient acknowledged the explanation, agreed to proceed with the plan and consent was signed. Patient is being admitted for inpatient treatment for surgery, pain control, PT, OT, prophylactic antibiotics, VTE prophylaxis, progressive  ambulation and ADL's and discharge planning. The patient is planning to be discharged home with home health services

## 2021-04-24 NOTE — Telephone Encounter (Signed)
Ortho bundle pre-op call completed. (Left TKR).

## 2021-04-25 ENCOUNTER — Ambulatory Visit (HOSPITAL_COMMUNITY): Payer: Medicare Other | Admitting: Anesthesiology

## 2021-04-25 ENCOUNTER — Encounter (HOSPITAL_COMMUNITY)
Admission: RE | Disposition: A | Discharge status: Home / Self Care | Payer: Self-pay | Source: Ambulatory Visit | Attending: Orthopaedic Surgery

## 2021-04-25 ENCOUNTER — Encounter (HOSPITAL_COMMUNITY): Payer: Self-pay | Admitting: Orthopaedic Surgery

## 2021-04-25 ENCOUNTER — Other Ambulatory Visit: Payer: Self-pay

## 2021-04-25 ENCOUNTER — Ambulatory Visit (HOSPITAL_COMMUNITY): Payer: Medicare Other | Admitting: Physician Assistant

## 2021-04-25 ENCOUNTER — Observation Stay (HOSPITAL_COMMUNITY)
Admission: RE | Admit: 2021-04-25 | Discharge: 2021-04-26 | Disposition: A | Discharge status: Home / Self Care | Payer: Medicare Other | Source: Ambulatory Visit | Attending: Orthopaedic Surgery | Admitting: Orthopaedic Surgery

## 2021-04-25 ENCOUNTER — Observation Stay (HOSPITAL_COMMUNITY): Payer: Medicare Other

## 2021-04-25 DIAGNOSIS — Z87891 Personal history of nicotine dependence: Secondary | ICD-10-CM | POA: Insufficient documentation

## 2021-04-25 DIAGNOSIS — J45909 Unspecified asthma, uncomplicated: Secondary | ICD-10-CM | POA: Insufficient documentation

## 2021-04-25 DIAGNOSIS — Z79899 Other long term (current) drug therapy: Secondary | ICD-10-CM | POA: Diagnosis not present

## 2021-04-25 DIAGNOSIS — K219 Gastro-esophageal reflux disease without esophagitis: Secondary | ICD-10-CM | POA: Diagnosis not present

## 2021-04-25 DIAGNOSIS — I10 Essential (primary) hypertension: Secondary | ICD-10-CM | POA: Diagnosis not present

## 2021-04-25 DIAGNOSIS — Z8616 Personal history of COVID-19: Secondary | ICD-10-CM | POA: Insufficient documentation

## 2021-04-25 DIAGNOSIS — G8918 Other acute postprocedural pain: Secondary | ICD-10-CM | POA: Diagnosis not present

## 2021-04-25 DIAGNOSIS — Z7982 Long term (current) use of aspirin: Secondary | ICD-10-CM | POA: Insufficient documentation

## 2021-04-25 DIAGNOSIS — M1712 Unilateral primary osteoarthritis, left knee: Principal | ICD-10-CM | POA: Diagnosis present

## 2021-04-25 DIAGNOSIS — Z471 Aftercare following joint replacement surgery: Secondary | ICD-10-CM | POA: Diagnosis not present

## 2021-04-25 DIAGNOSIS — Z96651 Presence of right artificial knee joint: Secondary | ICD-10-CM | POA: Insufficient documentation

## 2021-04-25 DIAGNOSIS — Z8546 Personal history of malignant neoplasm of prostate: Secondary | ICD-10-CM | POA: Insufficient documentation

## 2021-04-25 DIAGNOSIS — Z9889 Other specified postprocedural states: Secondary | ICD-10-CM | POA: Diagnosis not present

## 2021-04-25 DIAGNOSIS — M170 Bilateral primary osteoarthritis of knee: Secondary | ICD-10-CM | POA: Diagnosis not present

## 2021-04-25 DIAGNOSIS — Z96652 Presence of left artificial knee joint: Secondary | ICD-10-CM

## 2021-04-25 HISTORY — PX: TOTAL KNEE ARTHROPLASTY: SHX125

## 2021-04-25 LAB — TYPE AND SCREEN
ABO/RH(D): O POS
Antibody Screen: NEGATIVE

## 2021-04-25 SURGERY — ARTHROPLASTY, KNEE, TOTAL
Anesthesia: Spinal | Site: Knee | Laterality: Left

## 2021-04-25 MED ORDER — MENTHOL 3 MG MT LOZG: 1.0000 | LOZENGE | OROMUCOSAL | Status: DC | PRN

## 2021-04-25 MED ORDER — DIPHENHYDRAMINE HCL 12.5 MG/5ML PO ELIX: 12.5000 mg | ORAL_SOLUTION | ORAL | Status: DC | PRN

## 2021-04-25 MED ORDER — SODIUM CHLORIDE 0.9 % IR SOLN
Status: DC | PRN
  Administered 2021-04-25: 1000 mL

## 2021-04-25 MED ORDER — ROPIVACAINE HCL 5 MG/ML IJ SOLN
INTRAMUSCULAR | Status: DC | PRN
  Administered 2021-04-25: 30 mL via PERINEURAL

## 2021-04-25 MED ORDER — ONDANSETRON HCL 4 MG/2ML IJ SOLN: 4.0000 mg | Freq: Four times a day (QID) | INTRAMUSCULAR | Status: DC | PRN

## 2021-04-25 MED ORDER — FENTANYL CITRATE (PF) 100 MCG/2ML IJ SOLN
50.0000 ug | INTRAMUSCULAR | Status: DC
  Administered 2021-04-25: 50 ug via INTRAVENOUS
  Filled 2021-04-25: qty 2

## 2021-04-25 MED ORDER — MOMETASONE FURO-FORMOTEROL FUM 200-5 MCG/ACT IN AERO: 2.0000 | INHALATION_SPRAY | Freq: Two times a day (BID) | RESPIRATORY_TRACT | Status: DC | PRN

## 2021-04-25 MED ORDER — AMLODIPINE BESYLATE 5 MG PO TABS
5.0000 mg | ORAL_TABLET | Freq: Every day | ORAL | Status: DC
  Administered 2021-04-26: 5 mg via ORAL
  Filled 2021-04-25: qty 1

## 2021-04-25 MED ORDER — PHENYLEPHRINE 40 MCG/ML (10ML) SYRINGE FOR IV PUSH (FOR BLOOD PRESSURE SUPPORT)
PREFILLED_SYRINGE | INTRAVENOUS | Status: AC
  Filled 2021-04-25: qty 10

## 2021-04-25 MED ORDER — EPHEDRINE 5 MG/ML INJ
INTRAVENOUS | Status: AC
  Filled 2021-04-25: qty 5

## 2021-04-25 MED ORDER — DEXAMETHASONE SODIUM PHOSPHATE 10 MG/ML IJ SOLN
INTRAMUSCULAR | Status: AC
  Filled 2021-04-25: qty 1

## 2021-04-25 MED ORDER — ONDANSETRON HCL 4 MG/2ML IJ SOLN: 4.0000 mg | Freq: Once | INTRAMUSCULAR | Status: DC | PRN

## 2021-04-25 MED ORDER — METOCLOPRAMIDE HCL 5 MG/ML IJ SOLN: 5.0000 mg | Freq: Three times a day (TID) | INTRAMUSCULAR | Status: DC | PRN

## 2021-04-25 MED ORDER — PROPOFOL 500 MG/50ML IV EMUL
INTRAVENOUS | Status: DC | PRN
  Administered 2021-04-25: 75 ug/kg/min via INTRAVENOUS

## 2021-04-25 MED ORDER — ONDANSETRON HCL 4 MG PO TABS: 4.0000 mg | ORAL_TABLET | Freq: Four times a day (QID) | ORAL | Status: DC | PRN

## 2021-04-25 MED ORDER — ALUM & MAG HYDROXIDE-SIMETH 200-200-20 MG/5ML PO SUSP: 30.0000 mL | ORAL | Status: DC | PRN

## 2021-04-25 MED ORDER — 0.9 % SODIUM CHLORIDE (POUR BTL) OPTIME
TOPICAL | Status: DC | PRN
  Administered 2021-04-25: 1000 mL

## 2021-04-25 MED ORDER — METHOCARBAMOL 500 MG PO TABS
500.0000 mg | ORAL_TABLET | Freq: Four times a day (QID) | ORAL | Status: DC | PRN
  Administered 2021-04-25 – 2021-04-26 (×3): 500 mg via ORAL
  Filled 2021-04-25 (×3): qty 1

## 2021-04-25 MED ORDER — CHLORHEXIDINE GLUCONATE 0.12 % MT SOLN
15.0000 mL | Freq: Once | OROMUCOSAL | Status: AC
  Administered 2021-04-25: 15 mL via OROMUCOSAL

## 2021-04-25 MED ORDER — ASPIRIN 81 MG PO CHEW: 81.0000 mg | CHEWABLE_TABLET | Freq: Two times a day (BID) | ORAL | 0 refills | Status: AC

## 2021-04-25 MED ORDER — CITALOPRAM HYDROBROMIDE 20 MG PO TABS
20.0000 mg | ORAL_TABLET | Freq: Every day | ORAL | Status: DC
  Administered 2021-04-26: 20 mg via ORAL
  Filled 2021-04-25: qty 1

## 2021-04-25 MED ORDER — OXYCODONE HCL 5 MG PO TABS
5.0000 mg | ORAL_TABLET | ORAL | Status: DC | PRN
  Administered 2021-04-25 (×2): 10 mg via ORAL
  Filled 2021-04-25: qty 2

## 2021-04-25 MED ORDER — BUPIVACAINE-EPINEPHRINE (PF) 0.25% -1:200000 IJ SOLN
INTRAMUSCULAR | Status: AC
  Filled 2021-04-25: qty 30

## 2021-04-25 MED ORDER — LIDOCAINE 2% (20 MG/ML) 5 ML SYRINGE
INTRAMUSCULAR | Status: AC
  Filled 2021-04-25: qty 5

## 2021-04-25 MED ORDER — STERILE WATER FOR IRRIGATION IR SOLN
Status: DC | PRN
  Administered 2021-04-25: 2000 mL

## 2021-04-25 MED ORDER — ASPIRIN 81 MG PO CHEW
81.0000 mg | CHEWABLE_TABLET | Freq: Two times a day (BID) | ORAL | Status: DC
  Administered 2021-04-25 – 2021-04-26 (×2): 81 mg via ORAL
  Filled 2021-04-25 (×2): qty 1

## 2021-04-25 MED ORDER — METOCLOPRAMIDE HCL 5 MG PO TABS: 5.0000 mg | ORAL_TABLET | Freq: Three times a day (TID) | ORAL | Status: DC | PRN

## 2021-04-25 MED ORDER — PROPOFOL 500 MG/50ML IV EMUL
INTRAVENOUS | Status: AC
  Filled 2021-04-25: qty 50

## 2021-04-25 MED ORDER — ORAL CARE MOUTH RINSE: 15.0000 mL | Freq: Once | OROMUCOSAL | Status: AC

## 2021-04-25 MED ORDER — PANTOPRAZOLE SODIUM 40 MG PO TBEC
40.0000 mg | DELAYED_RELEASE_TABLET | Freq: Every day | ORAL | Status: DC
  Administered 2021-04-26: 40 mg via ORAL
  Filled 2021-04-25: qty 1

## 2021-04-25 MED ORDER — EPHEDRINE SULFATE 50 MG/ML IJ SOLN
INTRAMUSCULAR | Status: DC | PRN
  Administered 2021-04-25 (×5): 10 mg via INTRAVENOUS

## 2021-04-25 MED ORDER — METHOCARBAMOL 500 MG PO TABS: 500.0000 mg | ORAL_TABLET | Freq: Four times a day (QID) | ORAL | 1 refills | Status: DC | PRN

## 2021-04-25 MED ORDER — OXYCODONE HCL 5 MG PO TABS
10.0000 mg | ORAL_TABLET | ORAL | Status: DC | PRN
  Administered 2021-04-26 (×2): 15 mg via ORAL
  Filled 2021-04-25 (×2): qty 3
  Filled 2021-04-25: qty 2

## 2021-04-25 MED ORDER — DEXAMETHASONE SODIUM PHOSPHATE 4 MG/ML IJ SOLN
INTRAMUSCULAR | Status: DC | PRN
  Administered 2021-04-25: 5 mg via INTRAVENOUS

## 2021-04-25 MED ORDER — MIDAZOLAM HCL 2 MG/2ML IJ SOLN
1.0000 mg | INTRAMUSCULAR | Status: DC
  Administered 2021-04-25: 1 mg via INTRAVENOUS
  Filled 2021-04-25: qty 2

## 2021-04-25 MED ORDER — ACETAMINOPHEN 325 MG PO TABS: 325.0000 mg | ORAL_TABLET | Freq: Four times a day (QID) | ORAL | Status: DC | PRN

## 2021-04-25 MED ORDER — PRAVASTATIN SODIUM 20 MG PO TABS
10.0000 mg | ORAL_TABLET | Freq: Every day | ORAL | Status: DC
  Administered 2021-04-25: 10 mg via ORAL
  Filled 2021-04-25 (×2): qty 1

## 2021-04-25 MED ORDER — ATENOLOL 25 MG PO TABS
25.0000 mg | ORAL_TABLET | Freq: Every day | ORAL | Status: DC
  Administered 2021-04-26: 25 mg via ORAL
  Filled 2021-04-25: qty 1

## 2021-04-25 MED ORDER — PHENOL 1.4 % MT LIQD: 1.0000 | OROMUCOSAL | Status: DC | PRN

## 2021-04-25 MED ORDER — ONDANSETRON HCL 4 MG/2ML IJ SOLN
INTRAMUSCULAR | Status: DC | PRN
  Administered 2021-04-25: 4 mg via INTRAVENOUS

## 2021-04-25 MED ORDER — DOCUSATE SODIUM 100 MG PO CAPS
100.0000 mg | ORAL_CAPSULE | Freq: Two times a day (BID) | ORAL | Status: DC
  Administered 2021-04-25 – 2021-04-26 (×2): 100 mg via ORAL
  Filled 2021-04-25 (×2): qty 1

## 2021-04-25 MED ORDER — FENTANYL CITRATE (PF) 100 MCG/2ML IJ SOLN
INTRAMUSCULAR | Status: DC | PRN
  Administered 2021-04-25 (×4): 25 ug via INTRAVENOUS

## 2021-04-25 MED ORDER — CLONIDINE HCL (ANALGESIA) 100 MCG/ML EP SOLN
EPIDURAL | Status: DC | PRN
  Administered 2021-04-25: 50 ug

## 2021-04-25 MED ORDER — CEFAZOLIN SODIUM-DEXTROSE 2-4 GM/100ML-% IV SOLN
2.0000 g | INTRAVENOUS | Status: AC
  Administered 2021-04-25: 2 g via INTRAVENOUS
  Filled 2021-04-25: qty 100

## 2021-04-25 MED ORDER — CEFAZOLIN SODIUM-DEXTROSE 1-4 GM/50ML-% IV SOLN
1.0000 g | Freq: Four times a day (QID) | INTRAVENOUS | Status: AC
  Administered 2021-04-25 – 2021-04-26 (×2): 1 g via INTRAVENOUS
  Filled 2021-04-25 (×2): qty 50

## 2021-04-25 MED ORDER — CHLORHEXIDINE GLUCONATE CLOTH 2 % EX PADS: 6.0000 | MEDICATED_PAD | Freq: Every day | CUTANEOUS | Status: DC

## 2021-04-25 MED ORDER — FENTANYL CITRATE (PF) 100 MCG/2ML IJ SOLN
INTRAMUSCULAR | Status: AC
  Filled 2021-04-25: qty 2

## 2021-04-25 MED ORDER — EZETIMIBE 10 MG PO TABS
10.0000 mg | ORAL_TABLET | Freq: Every day | ORAL | Status: DC
  Administered 2021-04-26: 10 mg via ORAL
  Filled 2021-04-25: qty 1

## 2021-04-25 MED ORDER — SODIUM CHLORIDE 0.9 % IV SOLN: INTRAVENOUS | Status: DC

## 2021-04-25 MED ORDER — HYDROMORPHONE HCL 1 MG/ML IJ SOLN
0.5000 mg | INTRAMUSCULAR | Status: DC | PRN
  Administered 2021-04-25 – 2021-04-26 (×2): 1 mg via INTRAVENOUS
  Filled 2021-04-25 (×2): qty 1

## 2021-04-25 MED ORDER — TRANEXAMIC ACID-NACL 1000-0.7 MG/100ML-% IV SOLN
1000.0000 mg | INTRAVENOUS | Status: AC
  Administered 2021-04-25: 1000 mg via INTRAVENOUS
  Filled 2021-04-25: qty 100

## 2021-04-25 MED ORDER — ONDANSETRON HCL 4 MG/2ML IJ SOLN
INTRAMUSCULAR | Status: AC
  Filled 2021-04-25: qty 2

## 2021-04-25 MED ORDER — LACTATED RINGERS IV SOLN: INTRAVENOUS | Status: DC

## 2021-04-25 MED ORDER — ACETAMINOPHEN 500 MG PO TABS
1000.0000 mg | ORAL_TABLET | Freq: Once | ORAL | Status: AC
  Administered 2021-04-25: 1000 mg via ORAL
  Filled 2021-04-25: qty 2

## 2021-04-25 MED ORDER — PHENYLEPHRINE HCL (PRESSORS) 10 MG/ML IV SOLN
INTRAVENOUS | Status: DC | PRN
  Administered 2021-04-25 (×5): 40 ug via INTRAVENOUS

## 2021-04-25 MED ORDER — OXYCODONE HCL 5 MG PO TABS: 5.0000 mg | ORAL_TABLET | ORAL | 0 refills | Status: DC | PRN

## 2021-04-25 MED ORDER — VITAMIN D 25 MCG (1000 UNIT) PO TABS
1000.0000 [IU] | ORAL_TABLET | Freq: Every day | ORAL | Status: DC
  Administered 2021-04-26: 1000 [IU] via ORAL
  Filled 2021-04-25: qty 1

## 2021-04-25 MED ORDER — METHOCARBAMOL 500 MG IVPB - SIMPLE MED
500.0000 mg | Freq: Four times a day (QID) | INTRAVENOUS | Status: DC | PRN
  Filled 2021-04-25: qty 50

## 2021-04-25 MED ORDER — MONTELUKAST SODIUM 10 MG PO TABS
10.0000 mg | ORAL_TABLET | Freq: Every day | ORAL | Status: DC
  Administered 2021-04-26: 10 mg via ORAL
  Filled 2021-04-25: qty 1

## 2021-04-25 MED ORDER — POVIDONE-IODINE 10 % EX SWAB
2.0000 "application " | Freq: Once | CUTANEOUS | Status: AC
  Administered 2021-04-25: 2 via TOPICAL

## 2021-04-25 MED ORDER — FENTANYL CITRATE (PF) 100 MCG/2ML IJ SOLN: 25.0000 ug | INTRAMUSCULAR | Status: DC | PRN

## 2021-04-25 MED ORDER — POLYETHYLENE GLYCOL 3350 17 G PO PACK: 17.0000 g | PACK | Freq: Every day | ORAL | Status: DC | PRN

## 2021-04-25 SURGICAL SUPPLY — 60 items
APL SKNCLS STERI-STRIP NONHPOA (GAUZE/BANDAGES/DRESSINGS)
BAG COUNTER SPONGE SURGICOUNT (BAG) ×1 IMPLANT
BAG SPEC THK2 15X12 ZIP CLS (MISCELLANEOUS) ×1
BAG SPNG CNTER NS LX DISP (BAG) ×1
BAG SURGICOUNT SPONGE COUNTING (BAG) ×1
BAG ZIPLOCK 12X15 (MISCELLANEOUS) ×3 IMPLANT
BEARING TRIATHLON TIBIAL 6X11 (Orthopedic Implant) ×2 IMPLANT
BENZOIN TINCTURE PRP APPL 2/3 (GAUZE/BANDAGES/DRESSINGS) IMPLANT
BLADE SAG 18X100X1.27 (BLADE) ×3 IMPLANT
BLADE SURG SZ10 CARB STEEL (BLADE) ×6 IMPLANT
BNDG ELASTIC 6X5.8 VLCR STR LF (GAUZE/BANDAGES/DRESSINGS) ×4 IMPLANT
BOWL SMART MIX CTS (DISPOSABLE) IMPLANT
BSPLAT TIB 6 KN TRITANIUM (Knees) ×1 IMPLANT
CLOSURE WOUND 1/2 X4 (GAUZE/BANDAGES/DRESSINGS)
COOLER ICEMAN CLASSIC (MISCELLANEOUS) ×3 IMPLANT
COVER SURGICAL LIGHT HANDLE (MISCELLANEOUS) ×3 IMPLANT
CUFF TOURN SGL QUICK 34 (TOURNIQUET CUFF) ×3
CUFF TRNQT CYL 34X4.125X (TOURNIQUET CUFF) ×1 IMPLANT
DECANTER SPIKE VIAL GLASS SM (MISCELLANEOUS) IMPLANT
DRAPE U-SHAPE 47X51 STRL (DRAPES) ×3 IMPLANT
DRSG PAD ABDOMINAL 8X10 ST (GAUZE/BANDAGES/DRESSINGS) ×6 IMPLANT
DURAPREP 26ML APPLICATOR (WOUND CARE) ×3 IMPLANT
ELECT BLADE TIP CTD 4 INCH (ELECTRODE) ×3 IMPLANT
ELECT REM PT RETURN 15FT ADLT (MISCELLANEOUS) ×3 IMPLANT
FEMORAL POSTERIOR SZ5 LFT (Femur) IMPLANT
GAUZE SPONGE 4X4 12PLY STRL (GAUZE/BANDAGES/DRESSINGS) ×3 IMPLANT
GAUZE XEROFORM 1X8 LF (GAUZE/BANDAGES/DRESSINGS) ×2 IMPLANT
GLOVE SRG 8 PF TXTR STRL LF DI (GLOVE) ×2 IMPLANT
GLOVE SURG ENC MOIS LTX SZ7.5 (GLOVE) ×3 IMPLANT
GLOVE SURG NEOPR MICRO LF SZ8 (GLOVE) ×3 IMPLANT
GLOVE SURG UNDER POLY LF SZ8 (GLOVE) ×6
GOWN STRL REUS W/TWL XL LVL3 (GOWN DISPOSABLE) ×6 IMPLANT
HANDPIECE INTERPULSE COAX TIP (DISPOSABLE) ×3
HOLDER FOLEY CATH W/STRAP (MISCELLANEOUS) ×2 IMPLANT
IMMOBILIZER KNEE 20 (SOFTGOODS) ×3
IMMOBILIZER KNEE 20 THIGH 36 (SOFTGOODS) ×1 IMPLANT
KIT TURNOVER KIT A (KITS) ×3 IMPLANT
KNEE PATELLA ASYMMETRIC 10X32 (Knees) ×2 IMPLANT
KNEE TIBIAL COMPONENT SZ6 (Knees) ×2 IMPLANT
NS IRRIG 1000ML POUR BTL (IV SOLUTION) ×3 IMPLANT
PACK TOTAL KNEE CUSTOM (KITS) ×3 IMPLANT
PAD COLD SHLDR WRAP-ON (PAD) ×3 IMPLANT
PADDING CAST COTTON 6X4 STRL (CAST SUPPLIES) ×4 IMPLANT
PENCIL SMOKE EVACUATOR (MISCELLANEOUS) ×2 IMPLANT
PIN FLUTED HEDLESS FIX 3.5X1/8 (PIN) ×2 IMPLANT
POSTERIOR FEMORAL SZ5 LFT (Femur) ×3 IMPLANT
PROTECTOR NERVE ULNAR (MISCELLANEOUS) ×3 IMPLANT
SET HNDPC FAN SPRY TIP SCT (DISPOSABLE) ×1 IMPLANT
SET PAD KNEE POSITIONER (MISCELLANEOUS) ×3 IMPLANT
SPONGE T-LAP 18X18 ~~LOC~~+RFID (SPONGE) ×4 IMPLANT
STAPLER VISISTAT 35W (STAPLE) IMPLANT
STRIP CLOSURE SKIN 1/2X4 (GAUZE/BANDAGES/DRESSINGS) IMPLANT
SUT MNCRL AB 4-0 PS2 18 (SUTURE) IMPLANT
SUT VIC AB 0 CT1 27 (SUTURE) ×3
SUT VIC AB 0 CT1 27XBRD ANTBC (SUTURE) ×1 IMPLANT
SUT VIC AB 1 CT1 36 (SUTURE) ×6 IMPLANT
SUT VIC AB 2-0 CT1 27 (SUTURE) ×6
SUT VIC AB 2-0 CT1 TAPERPNT 27 (SUTURE) ×2 IMPLANT
TRAY FOLEY MTR SLVR 16FR STAT (SET/KITS/TRAYS/PACK) IMPLANT
WATER STERILE IRR 1000ML POUR (IV SOLUTION) ×6 IMPLANT

## 2021-04-25 NOTE — Transfer of Care (Addendum)
Immediate Anesthesia Transfer of Care Note  Patient: [name] [name]  Procedure(s) Performed: Procedure(s) (LRB): LEFT TOTAL KNEE ARTHROPLASTY (Left)  Patient Location: PACU  Anesthesia Type: MAC/ SAB   Level of Consciousness: awake, sedated, patient cooperative and responds to stimulation  Airway & Oxygen Therapy: Patient Spontanous Breathing and Patient connected to face mask oxygen  Post-op Assessment: Report given to PACU RN, Post -op Vital signs reviewed and stable and Patient moving all extremities  Post vital signs: Reviewed and stable  Complications: No apparent anesthesia complications

## 2021-04-25 NOTE — Brief Op Note (Signed)
04/25/2021  1:57 PM  PATIENT:  [name]  75 y.o. male  PRE-OPERATIVE DIAGNOSIS:  osteoarthritis left knee  POST-OPERATIVE DIAGNOSIS:  osteoarthritis left knee  PROCEDURE:  Procedure(s): LEFT TOTAL KNEE ARTHROPLASTY (Left)  SURGEON:  Surgeon(s) and Role:    Mcarthur Rossetti, MD - Primary  PHYSICIAN ASSISTANT:  Benita Stabile, PA-C  ANESTHESIA:   regional and spinal  EBL:  50 mL   COUNTS:  YES  TOURNIQUET:   Total Tourniquet Time Documented: Thigh (Left) - 43 minutes Total: Thigh (Left) - 43 minutes   DICTATION: .Other Dictation: Dictation Number [id number]  PLAN OF CARE: Admit for overnight observation  PATIENT DISPOSITION:  PACU - hemodynamically stable.   Delay start of Pharmacological VTE agent (>24hrs) due to surgical blood loss or risk of bleeding: no

## 2021-04-25 NOTE — Progress Notes (Signed)
AssistedDr. Turk with left, ultrasound guided, adductor canal block. Side rails up, monitors on throughout procedure. See vital signs in flow sheet. Tolerated Procedure well.  

## 2021-04-25 NOTE — Anesthesia Procedure Notes (Signed)
Anesthesia Regional Block: Adductor canal block   Pre-Anesthetic Checklist: , timeout performed,  Correct Patient, Correct Site, Correct Laterality,  Correct Procedure, Correct Position, site marked,  Risks and benefits discussed,  Surgical consent,  Pre-op evaluation,  At surgeon's request and post-op pain management  Laterality: Left  Prep: chloraprep       Needles:  Injection technique: Single-shot  Needle Type: Echogenic Needle     Needle Length: 9cm  Needle Gauge: 21     Additional Needles:   Procedures:,,,, ultrasound used (permanent image in chart),,    Narrative:  Start time: 04/25/2021 11:11 AM End time: 04/25/2021 11:17 AM Injection made incrementally with aspirations every 5 mL.  Performed by: Personally  Anesthesiologist: Catalina Gravel, MD  Additional Notes: No pain on injection. No increased resistance to injection. Injection made in 5cc increments.  Good needle visualization.  Patient tolerated procedure well.

## 2021-04-25 NOTE — Discharge Instructions (Signed)

## 2021-04-25 NOTE — Care Plan (Signed)
Ortho Bundle Case Management Note  Patient Details  Name: [name] [name] MRN: [id number] Date of Birth: 10-May-1946  [facility] RNCM call to patient to discuss his upcoming Left total knee replacement with Dr. Ninfa Linden. He is now recovered from his Right total knee replacement done on 01/03/21. He lives with his wife and plans to return home after discharge. He has all DME at home (FWW, 3in1/BSC). We discussed HHPT vs. Starting OPPT and he states that starting OPPT next week would be agreeable. Ordered and scheduled for Tuesday, 04/29/21 at 11:15 am. Reviewed post-op care instructions. Will continue to follow for needs.                  DME Arranged:   (Has all DME needed- previous Right TKA on 01/03/21.) DME Agency:     HH Arranged:    [place] Agency:   (No HHPT will be needed - patient requested to be scheduled with OPPT after surgery. Scheduled with [facility] in [place] on 04/29/21 at 11:15)  Additional Comments: Please contact me with any questions of if this plan should need to change.  Jamse Arn, RN, BSN, [employer]  [phone] 04/25/2021, 2:56 PM

## 2021-04-25 NOTE — Anesthesia Preprocedure Evaluation (Addendum)
Anesthesia Evaluation  Patient identified by MRN, date of birth, ID band Patient awake    Reviewed: Allergy & Precautions, NPO status , Patient's Chart, lab work & pertinent test results, reviewed documented beta blocker date and time   Airway Mallampati: II  TM Distance: >3 FB Neck ROM: Full    Dental  (+) Dental Advisory Given, Missing, Caps   Pulmonary asthma , former smoker,    Pulmonary exam normal breath sounds clear to auscultation       Cardiovascular hypertension, Pt. on home beta blockers and Pt. on medications (-) angina(-) Past MI Normal cardiovascular exam Rhythm:Regular Rate:Normal     Neuro/Psych PSYCHIATRIC DISORDERS Depression  Neuromuscular disease    GI/Hepatic Neg liver ROS, GERD  ,  Endo/Other  Obesity   Renal/GU negative Renal ROS     Musculoskeletal  (+) Arthritis ,   Abdominal   Peds  Hematology negative hematology ROS (+) Plt 235k   Anesthesia Other Findings Day of surgery medications reviewed with the patient.  Reproductive/Obstetrics                            Anesthesia Physical Anesthesia Plan  ASA: 3  Anesthesia Plan: Spinal   Post-op Pain Management:  Regional for Post-op pain   Induction: Intravenous  PONV Risk Score and Plan: 1 and Propofol infusion, Ondansetron and Dexamethasone  Airway Management Planned: Natural Airway and Nasal Cannula  Additional Equipment:   Intra-op Plan:   Post-operative Plan:   Informed Consent: I have reviewed the patients History and Physical, chart, labs and discussed the procedure including the risks, benefits and alternatives for the proposed anesthesia with the patient or authorized representative who has indicated his/her understanding and acceptance.     Dental advisory given  Plan Discussed with: CRNA, Anesthesiologist and Surgeon  Anesthesia Plan Comments:         Anesthesia Quick Evaluation

## 2021-04-25 NOTE — Anesthesia Procedure Notes (Signed)
Spinal  Patient location during procedure: OR Start time: 04/25/2021 12:27 PM End time: 04/25/2021 12:29 PM Reason for block: surgical anesthesia Staffing Performed: anesthesiologist  Anesthesiologist: Catalina Gravel, MD Preanesthetic Checklist Completed: patient identified, IV checked, site marked, risks and benefits discussed, surgical consent, monitors and equipment checked, pre-op evaluation and timeout performed Spinal Block Patient position: sitting Prep: DuraPrep and site prepped and draped Patient monitoring: heart rate, cardiac monitor, continuous pulse ox and blood pressure Approach: midline Location: L3-4 Injection technique: single-shot Needle Needle type: Pencan  Needle gauge: 24 G Needle length: 9 cm Assessment Sensory level: T8 Events: CSF return Additional Notes Functioning IV was confirmed and monitors were applied. Sterile prep and drape, including hand hygiene, mask and sterile gloves were used. The patient was positioned and the spine was prepped. The skin was anesthetized with lidocaine.  Free flow of clear CSF was obtained prior to injecting local anesthetic into the CSF.  The spinal needle aspirated freely following injection.  The needle was carefully withdrawn.  The patient tolerated the procedure well. Consent was obtained prior to procedure with all questions answered and concerns addressed. Risks including but not limited to bleeding, infection, nerve damage, paralysis, failed block, inadequate analgesia, allergic reaction, high spinal, itching and headache were discussed and the patient wished to proceed.   Hoy Morn, MD

## 2021-04-25 NOTE — Interval H&P Note (Signed)
History and Physical Interval Note: The patient understands that he is here today for a left total knee replacement to treat his left knee osteoarthritis.  There is been no acute or interval change in his medical status.  Please see recent H&P.  The risks and benefits of surgery been explained in detail and informed consent is obtained.   04/25/2021 11:17 AM  [name] [name]  has presented today for surgery, with the diagnosis of osteoarthritis left knee.  The various methods of treatment have been discussed with the patient and family. After consideration of risks, benefits and other options for treatment, the patient has consented to  Procedure(s): LEFT TOTAL KNEE ARTHROPLASTY (Left) as a surgical intervention.  The patient's history has been reviewed, patient examined, no change in status, stable for surgery.  I have reviewed the patient's chart and labs.  Questions were answered to the patient's satisfaction.     Mcarthur Rossetti

## 2021-04-25 NOTE — Anesthesia Postprocedure Evaluation (Signed)
Anesthesia Post Note  Patient: [name] [name]  Procedure(s) Performed: LEFT TOTAL KNEE ARTHROPLASTY (Left: Knee)     Patient location during evaluation: PACU Anesthesia Type: Spinal Level of consciousness: oriented, awake and alert and awake Pain management: pain level controlled Vital Signs Assessment: post-procedure vital signs reviewed and stable Respiratory status: spontaneous breathing, respiratory function stable and nonlabored ventilation Cardiovascular status: blood pressure returned to baseline and stable Postop Assessment: no headache, no backache, no apparent nausea or vomiting and spinal receding Anesthetic complications: no   No notable events documented.  Last Vitals:  Vitals:   04/25/21 1546 04/25/21 1740  BP: (!) 119/55 133/63  Pulse: 63 70  Resp: 18 18  Temp: (!) 36.3 C 36.7 C  SpO2: 98% 95%    Last Pain:  Vitals:   04/25/21 1706  TempSrc:   PainSc: 1                  Catalina Gravel

## 2021-04-25 NOTE — Evaluation (Signed)
Physical Therapy Evaluation Patient Details Name: [name] [name] MRN: [id number] DOB: 1945/12/15 Today's Date: 04/25/2021   History of Present Illness  Patient is 75 y.o. male s/p Lt TKA on 04/25/21 with PMH significant for OA, asthma, depression, HLD, HTN, prostate cancer, Rt RCR in 2019, Rt TKA on 01/03/21.  Clinical Impression  [name] [name] is a 75 y.o. male POD 0 s/p Lt TKA. Patient reports independence with mobility at baseline. Patient is now limited by functional impairments (see PT problem list below) and requires min assist for transfers and gait with RW. Patient was able to ambulate ~100 feet with RW and min assist and cues for safe walker management. Patient instructed in exercise to facilitate circulation to manage edema and reduce risk of DVT. Patient will benefit from continued skilled PT interventions to address impairments and progress towards PLOF. Acute PT will follow to progress mobility and stair training in preparation for safe discharge home.     Follow Up Recommendations Follow surgeon's recommendation for DC plan and follow-up therapies;Outpatient PT    Equipment Recommendations  None recommended by PT    Recommendations for Other Services       Precautions / Restrictions Precautions Precautions: Fall Restrictions Weight Bearing Restrictions: No Other Position/Activity Restrictions: WBAT      Mobility  Bed Mobility Overal bed mobility: Needs Assistance Bed Mobility: Supine to Sit     Supine to sit: Min assist;HOB elevated     General bed mobility comments: cues for use of bed rail to pivot and light assist for Lt LE to EOB.    Transfers Overall transfer level: Needs assistance Equipment used: Rolling walker (2 wheeled) Transfers: Sit to/from Stand Sit to Stand: Min assist;From elevated surface         General transfer comment: bed slighty elevated, cues for hand placement for power up, min assist to steady with rise and transfer of hands to  RW.  Ambulation/Gait Ambulation/Gait assistance: Min assist;Min guard Gait Distance (Feet): 100 Feet Assistive device: Rolling walker (2 wheeled) Gait Pattern/deviations: Step-to pattern;Step-through pattern;Decreased stride length Gait velocity: fair   General Gait Details: cues for safe proximity to RW and for safe step to pattern as pt had tendency to amb quickly and to move into step through pattern.  Stairs            Wheelchair Mobility    Modified Rankin (Stroke Patients Only)       Balance Overall balance assessment: Needs assistance Sitting-balance support: Feet supported Sitting balance-Leahy Scale: Good     Standing balance support: During functional activity;Bilateral upper extremity supported Standing balance-Leahy Scale: Fair                               Pertinent Vitals/Pain Pain Assessment: No/denies pain    Home Living Family/patient expects to be discharged to:: Private residence Living Arrangements: Spouse/significant other Available Help at Discharge: Family Type of Home: House Home Access: Stairs to enter Entrance Stairs-Rails: None Entrance Stairs-Number of Steps: 2 Home Layout: One level Home Equipment: Grab bars - toilet;Walker - 2 wheels;Cane - single point;Toilet riser;Bedside commode      Prior Function Level of Independence: Independent               Hand Dominance   Dominant Hand: Right    Extremity/Trunk Assessment   Upper Extremity Assessment Upper Extremity Assessment: Overall WFL for tasks assessed    Lower Extremity Assessment Lower Extremity  Assessment: LLE deficits/detail LLE Deficits / Details: good quad activation, no extensor lag wtih SLR. LLE Sensation: WNL LLE Coordination: WNL    Cervical / Trunk Assessment Cervical / Trunk Assessment: Normal  Communication   Communication: No difficulties  Cognition Arousal/Alertness: Awake/alert Behavior During Therapy: WFL for tasks  assessed/performed Overall Cognitive Status: Within Functional Limits for tasks assessed                                        General Comments      Exercises Total Joint Exercises Ankle Circles/Pumps: AROM;Both;20 reps;Seated   Assessment/Plan    PT Assessment Patient needs continued PT services  PT Problem List Decreased strength;Decreased range of motion;Decreased activity tolerance;Decreased mobility;Decreased balance;Decreased knowledge of precautions       PT Treatment Interventions DME instruction;Gait training;Stair training;Functional mobility training;Therapeutic activities;Therapeutic exercise;Balance training;Patient/family education    PT Goals (Current goals can be found in the Care Plan section)  Acute Rehab PT Goals Patient Stated Goal: get recovered and back to working around house PT Goal Formulation: With patient Time For Goal Achievement: 05/02/21 Potential to Achieve Goals: Good    Frequency 7X/week   Barriers to discharge        Co-evaluation               AM-PAC PT "6 Clicks" Mobility  Outcome Measure Help needed turning from your back to your side while in a flat bed without using bedrails?: None Help needed moving from lying on your back to sitting on the side of a flat bed without using bedrails?: A Little Help needed moving to and from a bed to a chair (including a wheelchair)?: A Little Help needed standing up from a chair using your arms (e.g., wheelchair or bedside chair)?: A Little Help needed to walk in hospital room?: A Little Help needed climbing 3-5 steps with a railing? : A Little 6 Click Score: 19    End of Session Equipment Utilized During Treatment: Gait belt Activity Tolerance: Patient tolerated treatment well Patient left: in chair;with call bell/phone within reach;with chair alarm set;with family/visitor present Nurse Communication: Mobility status PT Visit Diagnosis: Muscle weakness (generalized)  (M62.81);Difficulty in walking, not elsewhere classified (R26.2)    Time: [id number] PT Time Calculation (min) (ACUTE ONLY): 21 min   Charges:   PT Evaluation $PT Eval Low Complexity: 1 Low          Verner Mould, DPT Acute Rehabilitation Services Office [phone] Pager [phone]   Jacques Navy 04/25/2021, 5:43 PM

## 2021-04-26 DIAGNOSIS — I10 Essential (primary) hypertension: Secondary | ICD-10-CM | POA: Diagnosis not present

## 2021-04-26 DIAGNOSIS — M1712 Unilateral primary osteoarthritis, left knee: Secondary | ICD-10-CM | POA: Diagnosis not present

## 2021-04-26 DIAGNOSIS — J45909 Unspecified asthma, uncomplicated: Secondary | ICD-10-CM | POA: Diagnosis not present

## 2021-04-26 DIAGNOSIS — Z96651 Presence of right artificial knee joint: Secondary | ICD-10-CM | POA: Diagnosis not present

## 2021-04-26 DIAGNOSIS — Z8616 Personal history of COVID-19: Secondary | ICD-10-CM | POA: Diagnosis not present

## 2021-04-26 DIAGNOSIS — Z8546 Personal history of malignant neoplasm of prostate: Secondary | ICD-10-CM | POA: Diagnosis not present

## 2021-04-26 LAB — BASIC METABOLIC PANEL
Anion gap: 8 (ref 5–15)
BUN: 22 mg/dL (ref 8–23)
CO2: 25 mmol/L (ref 22–32)
Calcium: 8.6 mg/dL — ABNORMAL LOW (ref 8.9–10.3)
Chloride: 99 mmol/L (ref 98–111)
Creatinine, Ser: 1.38 mg/dL — ABNORMAL HIGH (ref 0.61–1.24)
GFR, Estimated: 54 mL/min — ABNORMAL LOW (ref >60)
Glucose, Bld: 163 mg/dL — ABNORMAL HIGH (ref 70–99)
Potassium: 4.4 mmol/L (ref 3.5–5.1)
Sodium: 132 mmol/L — ABNORMAL LOW (ref 135–145)

## 2021-04-26 LAB — CBC
HCT: 35.4 % — ABNORMAL LOW (ref 39.0–52.0)
Hemoglobin: 11.5 g/dL — ABNORMAL LOW (ref 13.0–17.0)
MCH: 30.8 pg (ref 26.0–34.0)
MCHC: 32.5 g/dL (ref 30.0–36.0)
MCV: 94.9 fL (ref 80.0–100.0)
Platelets: 200 10*3/uL (ref 150–400)
RBC: 3.73 MIL/uL — ABNORMAL LOW (ref 4.22–5.81)
RDW: 13.5 % (ref 11.5–15.5)
WBC: 16.2 10*3/uL — ABNORMAL HIGH (ref 4.0–10.5)
nRBC: 0 % (ref 0.0–0.2)

## 2021-04-26 NOTE — Progress Notes (Signed)
Physical Therapy Treatment Patient Details Name: [name] [name] MRN: [id number] DOB: [birth year] Today's Date: 04/26/2021    History of Present Illness Patient is 75 y.o. male s/p Lt TKA on 04/25/21 with PMH significant for OA, asthma, depression, HLD, HTN, prostate cancer, Rt RCR in 2019, Rt TKA on 01/03/21.    PT Comments    Progressing well with mobility. Reviewed and practiced exercises, gait training and stair training. Pt is eager to d/c home. He is familiar with process from previous TKA. All education completed. No further questions/concerns from pt. Okay to d/c from PT standpoint.     Follow Up Recommendations  Follow surgeon's recommendation for DC plan and follow-up therapies (plan is for OP PT to begin on Tuesday per pt)     Equipment Recommendations  None recommended by PT    Recommendations for Other Services       Precautions / Restrictions Precautions Precautions: Fall Restrictions Weight Bearing Restrictions: No Other Position/Activity Restrictions: WBAT    Mobility  Bed Mobility Overal bed mobility: Modified Independent                  Transfers Overall transfer level: Modified independent Equipment used: Rolling walker (2 wheeled) Transfers: Sit to/from Stand              Ambulation/Gait Ambulation/Gait assistance: Supervision Gait Distance (Feet): 100 Feet Assistive device: Rolling walker (2 wheeled) Gait Pattern/deviations: Step-to pattern     General Gait Details: Supv for safety. No LOB with RW. Pt tolerated distance well.   Stairs Stairs: Yes Stairs assistance: Min guard Stair Management: Step to pattern;Forwards;One rail Right Number of Stairs: 2 General stair comments: up and over portable stairs x 1. Pt able to state proper technique and sequence. Used R rail to simulate door frame and quad cane on L.   Wheelchair Mobility    Modified Rankin (Stroke Patients Only)       Balance Overall balance assessment: Mild  deficits observed, not formally tested                                          Cognition Arousal/Alertness: Awake/alert Behavior During Therapy: WFL for tasks assessed/performed Overall Cognitive Status: Within Functional Limits for tasks assessed                                        Exercises Total Joint Exercises Ankle Circles/Pumps: AROM;Both;10 reps Quad Sets: AROM;Both;10 reps Heel Slides: AROM;Left;10 reps Long Arc Quad: AROM;10 reps;Left Goniometric ROM: ~10-80 degrees    General Comments        Pertinent Vitals/Pain Pain Assessment: 0-10 Pain Score: 5  Pain Location: L knee Pain Descriptors / Indicators: Discomfort;Sore Pain Intervention(s): Monitored during session;Ice applied    Home Living                      Prior Function            PT Goals (current goals can now be found in the care plan section) Progress towards PT goals: Progressing toward goals    Frequency    7X/week      PT Plan Current plan remains appropriate    Co-evaluation              AM-PAC PT "6  Clicks" Mobility   Outcome Measure  Help needed turning from your back to your side while in a flat bed without using bedrails?: None Help needed moving from lying on your back to sitting on the side of a flat bed without using bedrails?: None Help needed moving to and from a bed to a chair (including a wheelchair)?: None Help needed standing up from a chair using your arms (e.g., wheelchair or bedside chair)?: None Help needed to walk in hospital room?: A Little Help needed climbing 3-5 steps with a railing? : A Little 6 Click Score: 22    End of Session Equipment Utilized During Treatment: Gait belt Activity Tolerance: Patient tolerated treatment well Patient left: in chair;with call bell/phone within reach   PT Visit Diagnosis: Other abnormalities of gait and mobility (R26.89);Pain Pain - Right/Left: Left Pain - part of body:  Knee     Time: [id number] PT Time Calculation (min) (ACUTE ONLY): 22 min  Charges:  $Gait Training: 8-22 mins                         Doreatha Massed, PT Acute Rehabilitation  Office: [phone] Pager: [phone]

## 2021-04-26 NOTE — Progress Notes (Signed)
  Subjective: [name] [name] is a 75 y.o. male s/p left TKA.  They are POD 1.  Pt's pain is controlled.  Pt denies numbness/tingling/weakness.  Pt has ambulated with some difficulty.  Denies any chest pain, shortness of breath, calf pain.  He was able to ambulate well with physical therapy and able to do stairs.  He states he is ready for discharge home.   Objective: Vital signs in last 24 hours: Temp:  [97.3 F (36.3 C)-98.1 F (36.7 C)] 97.5 F (36.4 C) (08/13 0417) Pulse Rate:  [57-72] 72 (08/13 0417) Resp:  [12-24] 14 (08/13 0417) BP: (98-136)/(47-75) 127/64 (08/13 0417) SpO2:  [93 %-99 %] 94 % (08/13 0417)  Intake/Output from previous day: 08/12 0701 - 08/13 0700 In: 4785.8 [P.O.:1560; I.V.:2975.8; IV Piggyback:250] Out: 1975 [Urine:1775; Blood:200] Intake/Output this shift: Total I/O In: 480 [P.O.:480] Out: -   Exam:  No gross blood or drainage overlying the dressing.  Immediate postop dressing was removed and revealed staples intact in incision with out any cellulitis surrounding the incision or any active drainage/bleeding 2+ DP pulse Sensation intact distally in the left foot Able to dorsiflex and plantarflex the left foot No calf tenderness.  Negative Homans' sign.   Labs: Recent Labs    04/26/21 0313  HGB 11.5*   Recent Labs    04/26/21 0313  WBC 16.2*  RBC 3.73*  HCT 35.4*  PLT 200   Recent Labs    04/26/21 0313  NA 132*  K 4.4  CL 99  CO2 25  BUN 22  CREATININE 1.38*  GLUCOSE 163*  CALCIUM 8.6*   No results for input(s): LABPT, INR in the last 72 hours.  Assessment/Plan: Pt is POD 1 s/p left TKA.    -Plan to discharge to home today pending evaluation by Dr. Ninfa Linden  -WBAT with a walker  -Dressing changed with Aquacel bandage and new Aquacel dressing provided for when patient goes home     [place] 04/26/2021, 10:30 AM

## 2021-04-26 NOTE — Op Note (Signed)
[name] [name], [name] [name] MEDICAL RECORD NO: [id number] ACCOUNT NO: [id number] DATE OF BIRTH: November 18, 1945 FACILITY: [facility] LOCATION: [id number] PHYSICIAN: Lind Guest. Ninfa Linden, MD  Operative Report   DATE OF PROCEDURE: 04/25/2021  PREOPERATIVE DIAGNOSIS:  Primary osteoarthritis and degenerative joint disease, left knee.  POSTOPERATIVE DIAGNOSIS:  Primary osteoarthritis and degenerative joint disease, left knee.  PROCEDURE:  Left total knee arthroplasty.  IMPLANTS:  [employer]ker Triathlon press-fit knee system with size 5 femur, size 6 tibial tray, 11 mm fixed bearing polyethylene insert, size 32 press-fit patellar button.  SURGEON:  Lind Guest. Ninfa Linden, M.D.  ASSISTANT:  Erskine Emery, PA-C.  ANESTHESIA:   1.  Left lower extremity adductor canal block. 2.  Spinal.  TOURNIQUET TIME:  43 minutes.  BLOOD LOSS:  Less 100 mL  ANTIBIOTICS:  2 g IV Ancef.  COMPLICATIONS:  None.  INDICATIONS:  The patient is a 75 year old gentleman well known to me.  He has debilitating arthritis involving both his knees and we have actually already replaced his right knee, which he has done well with.  At this point, his left knee pain is daily  and it is detrimentally affecting his mobility, his quality of life and his activities of daily living to the point he does wish to proceed with a total knee arthroplasty on the left side.  He is fully aware of the risk of acute blood loss anemia, nerve  or vessel injury, fracture, infection, DVT, implant failure and skin and soft tissue issues.  He understands our goals are decreased pain, improve mobility and overall improve quality of life.  DESCRIPTION OF PROCEDURE:  After informed consent was obtained, appropriate left knee was marked and anesthesia obtained in adductor canal block of the left lower extremity in the holding room.  He was then brought to the operating room and sat up on a  stretcher where spinal anesthesia was obtained. He was laid supine on the  operating table.  A Foley catheter was placed and a nonsterile tourniquet was placed around his upper left thigh.  His left thigh, knee, leg, ankle and foot were prepped and draped  in DuraPrep and sterile drapes including a sterile stockinette.  A timeout was called.  He was identified as correct patient, correct left knee.  We then used Esmarch to wrap that leg and tourniquet was inflated to 300 mm of pressure.  We then made a  direct midline incision over the patella and carried this proximally and distally.  We dissected down the knee joint, carried out a medial parapatellar arthrotomy, finding moderate joint effusion and significant osteophytes in all 3 compartments of the  knee.  With the knee in a flexed position, we removed remnants of ACL, PCL, medial and lateral meniscus.  We removed osteophytes from all three compartments.  We then set our extramedullary cutting guide for taking 9 mm off the high side of the proximal  tibia cut correcting for varus and valgus in a neutral slope.  We made this cut without difficulty.  We then went to the femur using intramedullary drill for our distal femoral cutting guide.  We set this for a left knee at 5 degrees externally rotated  for a 10 mm distal femoral cut.  We made that cut without difficulty, and brought the knee back down to full extension and a 9 mm extension block showed some slight flexion, so we took 2 more millimeters off the distal femur.  We then put our sizing  guide based off the epicondylar axis  and chose a size 5 femur.  We put a 4-in-1 cutting block for a size 5 left femur, made our anterior and posterior cuts, followed by our chamfer cuts.  We then made our femoral box cut.  Attention was then turned back  to the tibia. We chose a size 6 tibial tray for coverage, setting the rotation off the tibial tubercle and the femur. We made our keel punch off of this.  We did all this for a press-fit system given the good quality of bone.  We then  trialled a size 6  tibia followed by the size 5 left femur and a 9 mm fixed bearing polyethylene insert.  We decided to go with the 11 mm, the regular final insert.  We then made our patellar cut and drilled three holes for a size 32 press-fit patellar button.  We then  removed all instrumentation from the knee and irrigated the knee with normal saline solution using pulsatile lavage.  We dried the knee real well and then with the knee in a flexed position, placed our real [employer] Triathlon press-fit tibial tray, size 6  followed by a real press-fit left femur.  We placed our 11 mm fixed bearing polyethylene insert for a size 6 tibia and press fit our patellar button.  This was a size 32.  I then put the knee through several cycles of range of motion.  We were pleased  with range of motion and stability assessed clinically.  We then let the tourniquet down.  Hemostasis was obtained with electrocautery.  We closed the arthrotomy with interrupted #1 Vicryl suture followed by 0 Vicryl in deep tissue, 2-0 Vicryl to close  the subcutaneous tissue.  The skin was closed with staples.  A Xeroform well-padded sterile dressing was applied.  He was taken off the operating table and taken to recovery room in stable condition with all final counts being correct.  No complications  noted.  Of note, Benita Stabile, PA-C assisted during the entire case and his assistance was crucial for facilitating every aspect of this case.      PAA D: 04/25/2021 1:55:55 pm T: 04/26/2021 4:11:00 am  JOB: [id number]

## 2021-04-26 NOTE — Discharge Summary (Signed)
Patient ID: [name] [name] MRN: [id number] DOB/AGE: 06-01-1946 75 y.o.  Admit date: 04/25/2021 Discharge date: 04/26/2021  Admission Diagnoses:  Principal Problem:   Primary osteoarthritis of left knee Active Problems:   Status post total left knee replacement   Discharge Diagnoses:  Same  Past Medical History:  Diagnosis Date   Arthritis    Asthma, chronic 06/02/2013   Carpal tunnel syndrome 04/17/2015   Bilateral   Depression 06/02/2013   DYSPNEA ON EXERTION 06/04/2009   Qualifier: Diagnosis of  By: Domenic Polite, MD, Phillips Hay    Hereditary and idiopathic peripheral neuropathy 04/17/2015   Hyperlipemia 06/02/2013   Hypertension    LEG PAIN, BILATERAL 06/04/2009   Qualifier: Diagnosis of  By: Domenic Polite, MD, Jay CORONARY ANGIOPLASTY, HX OF 06/04/2009   Qualifier: Diagnosis of  By: Domenic Polite, MD, Phillips Hay    Prostate CA [facility])    and basal cell nose    Surgeries: Procedure(s): LEFT TOTAL KNEE ARTHROPLASTY on 04/25/2021   Consultants:   Discharged Condition: Improved  Hospital Course: [name] [name] is an 75 y.o. male who was admitted 04/25/2021 for operative treatment ofPrimary osteoarthritis of left knee. Patient has severe unremitting pain that affects sleep, daily activities, and work/hobbies. After pre-op clearance the patient was taken to the operating room on 04/25/2021 and underwent  Procedure(s): LEFT TOTAL KNEE ARTHROPLASTY.    Patient was given perioperative antibiotics:  Anti-infectives (From admission, onward)    Start     Dose/Rate Route Frequency Ordered Stop   04/25/21 1830  ceFAZolin (ANCEF) IVPB 1 g/50 mL premix        1 g 100 mL/hr over 30 Minutes Intravenous Every 6 hours 04/25/21 1542 04/26/21 0900   04/25/21 1000  ceFAZolin (ANCEF) IVPB 2g/100 mL premix        2 g 200 mL/hr over 30 Minutes Intravenous On call to O.R. 04/25/21 [id number] 04/25/21 1258        Patient was given  sequential compression devices, early ambulation, and chemoprophylaxis to prevent DVT.  Patient benefited maximally from hospital stay and there were no complications.    Recent vital signs: Patient Vitals for the past 24 hrs:  BP Temp Temp src Pulse Resp SpO2  04/26/21 1040 (!) 123/55 97.7 F (36.5 C) Oral 72 16 93 %  04/26/21 0417 127/64 (!) 97.5 F (36.4 C) Oral 72 14 94 %  04/25/21 2348 (!) 119/56 97.7 F (36.5 C) Oral 68 16 97 %  04/25/21 2020 (!) 123/58 97.8 F (36.6 C) Oral 71 16 96 %  04/25/21 1740 133/63 98.1 F (36.7 C) -- 70 18 95 %  04/25/21 1546 (!) 119/55 (!) 97.3 F (36.3 C) Oral 63 18 98 %  04/25/21 1534 (!) 119/54 -- -- 61 (!) 24 98 %  04/25/21 1530 (!) 117/56 -- -- (!) 59 20 98 %  04/25/21 1515 (!) 125/47 97.9 F (36.6 C) -- 62 14 98 %  04/25/21 1500 (!) 102/52 -- -- 63 17 96 %  04/25/21 1445 (!) 116/55 -- -- (!) 59 15 97 %  04/25/21 1440 -- -- -- 60 12 93 %  04/25/21 1435 -- -- -- (!) 59 14 98 %  04/25/21 1430 98/75 -- -- 62 14 97 %  04/25/21 1425 -- -- -- 65 17 97 %  04/25/21 1420 101/67 97.6 F (36.4 C) -- -- -- --     Recent laboratory studies:  Recent Labs    04/26/21 0313  WBC 16.2*  HGB 11.5*  HCT 35.4*  PLT 200  NA 132*  K 4.4  CL 99  CO2 25  BUN 22  CREATININE 1.38*  GLUCOSE 163*  CALCIUM 8.6*     Discharge Medications:   Allergies as of 04/26/2021       Reactions   Fenofibrate Other (See Comments)   Pt reports causes joint pains   Rosuvastatin Other (See Comments)   Pt reports causes joint pains        Medication List     TAKE these medications    amLODipine 5 MG tablet Commonly known as: NORVASC Take 1 tablet (5 mg total) by mouth daily. (NEEDS TO BE SEEN BEFORE NEXT REFILL)   aspirin 81 MG chewable tablet Chew 1 tablet (81 mg total) by mouth 2 (two) times daily. What changed: when to take this   atenolol 25 MG tablet Commonly known as: TENORMIN TAKE 1 TABLET DAILY   citalopram 20 MG tablet Commonly known as:  CELEXA Take 1 tablet (20 mg total) by mouth daily. (NEEDS TO BE SEEN BEFORE NEXT REFILL)   cyanocobalamin 1000 MCG/ML injection Commonly known as: (VITAMIN B-12) Inject 1 mL (1,000 mcg total) into the muscle every 30 (thirty) days.   diclofenac Sodium 1 % Gel Commonly known as: VOLTAREN Apply 1 application topically daily as needed (pain).   ezetimibe 10 MG tablet Commonly known as: ZETIA Take 10 mg by mouth daily.   loperamide 2 MG tablet Commonly known as: IMODIUM A-D Take 2 mg by mouth daily.   loratadine 10 MG tablet Commonly known as: CLARITIN Take 10 mg by mouth daily as needed for allergies.   methocarbamol 500 MG tablet Commonly known as: ROBAXIN Take 1 tablet (500 mg total) by mouth every 6 (six) hours as needed for muscle spasms.   montelukast 10 MG tablet Commonly known as: SINGULAIR TAKE 1 TABLET DAILY   naproxen 500 MG tablet Commonly known as: NAPROSYN Take 500 mg by mouth daily.   oxyCODONE 5 MG immediate release tablet Commonly known as: Oxy IR/ROXICODONE Take 1-2 tablets (5-10 mg total) by mouth every 4 (four) hours as needed for moderate pain (pain score 4-6).   pravastatin 20 MG tablet Commonly known as: PRAVACHOL Take 10 mg by mouth daily.   Vitamin D3 25 MCG (1000 UT) Caps Take 1,000 Units by mouth daily.       ASK your doctor about these medications    budesonide-formoterol 160-4.5 MCG/ACT inhaler Commonly known as: SYMBICORT Inhale 2 puffs into the lungs 2 (two) times daily.   dextromethorphan-guaiFENesin 30-600 MG 12hr tablet Commonly known as: MUCINEX DM Take 1 tablet by mouth 2 (two) times daily.               Durable Medical Equipment  (From admission, onward)           Start     Ordered   04/25/21 1543  DME 3 n 1  Once        04/25/21 1542   04/25/21 1543  DME Walker rolling  Once       Question Answer Comment  Walker: With 5 Inch Wheels   Patient needs a walker to treat with the following condition Status  post left knee replacement      04/25/21 1542            Diagnostic Studies: DG Knee Left Port  Result Date: 04/25/2021 CLINICAL DATA:  Postop EXAM: PORTABLE LEFT KNEE - 1-2 VIEW COMPARISON:  09/16/2020 FINDINGS: Left knee replacement with intact hardware and normal alignment. Gas in the soft tissues consistent with recent surgery IMPRESSION: Status post left knee replacement with expected postsurgical change Electronically Signed   By: Donavan Foil M.D.   On: 04/25/2021 17:05    Disposition: Discharge disposition: 01-Home or Self Care       Discharge Instructions     Call MD / Call 911   Complete by: As directed    If you experience chest pain or shortness of breath, CALL 911 and be transported to the hospital emergency room.  If you develope a fever above 101 F, pus (white drainage) or increased drainage or redness at the wound, or calf pain, call your surgeon's office.   Constipation Prevention   Complete by: As directed    Drink plenty of fluids.  Prune juice may be helpful.  You may use a stool softener, such as Colace (over the counter) 100 mg twice a day.  Use MiraLax (over the counter) for constipation as needed.   Diet - low sodium heart healthy   Complete by: As directed    Discharge instructions   Complete by: As directed    You may shower, dressing is waterproof.  Do not remove the dressing, we will remove it at your first post-op appointment.  Okay to replace the dressing with the one provided if the current starts to peel off. Do not take a bath or soak the knee in a tub or pool.  You may weightbear as you can tolerate on the operative leg with a walker.  Continue with PT exercises as directed.  Do NOT put a pillow under your knee.  You will follow-up with Dr. Ninfa Linden in the clinic in 2 weeks at your given appointment date.  Call the office with any questions or concerns.   INSTRUCTIONS AFTER JOINT REPLACEMENT   Remove items at home which could result in a fall.  This includes throw rugs or furniture in walking pathways ICE to the affected joint every three hours while awake for 30 minutes at a time, for at least the first 3-5 days, and then as needed for pain and swelling.  Continue to use ice for pain and swelling. You may notice swelling that will progress down to the foot and ankle.  This is normal after surgery.  Elevate your leg when you are not up walking on it.   Continue to use the breathing machine you got in the hospital (incentive spirometer) which will help keep your temperature down.  It is common for your temperature to cycle up and down following surgery, especially at night when you are not up moving around and exerting yourself.  The breathing machine keeps your lungs expanded and your temperature down.   DIET:  As you were doing prior to hospitalization, we recommend a well-balanced diet.  DRESSING / WOUND CARE / SHOWERING  Keep the surgical dressing until follow up.  The dressing is water proof, so you can shower without any extra covering.  IF THE DRESSING FALLS OFF or the wound gets wet inside, change the dressing with sterile gauze.  Please use good hand washing techniques before changing the dressing.  Do not use any lotions or creams on the incision until instructed by your surgeon.    ACTIVITY  Increase activity slowly as tolerated, but follow the weight bearing instructions below.   No driving for 6 weeks or until further direction given by your physician.  You cannot  drive while taking narcotics.  No lifting or carrying greater than 10 lbs. until further directed by your surgeon. Avoid periods of inactivity such as sitting longer than an hour when not asleep. This helps prevent blood clots.  You may return to work once you are authorized by your doctor.     WEIGHT BEARING   Weight bearing as tolerated with assist device (walker, cane, etc) as directed, use it as long as suggested by your surgeon or therapist, typically at  least 4-6 weeks.   EXERCISES  Results after joint replacement surgery are often greatly improved when you follow the exercise, range of motion and muscle strengthening exercises prescribed by your doctor. Safety measures are also important to protect the joint from further injury. Any time any of these exercises cause you to have increased pain or swelling, decrease what you are doing until you are comfortable again and then slowly increase them. If you have problems or questions, call your caregiver or physical therapist for advice.   Rehabilitation is important following a joint replacement. After just a few days of immobilization, the muscles of the leg can become weakened and shrink (atrophy).  These exercises are designed to build up the tone and strength of the thigh and leg muscles and to improve motion. Often times heat used for twenty to thirty minutes before working out will loosen up your tissues and help with improving the range of motion but do not use heat for the first two weeks following surgery (sometimes heat can increase post-operative swelling).   These exercises can be done on a training (exercise) mat, on the floor, on a table or on a bed. Use whatever works the best and is most comfortable for you.    Use music or television while you are exercising so that the exercises are a pleasant break in your day. This will make your life better with the exercises acting as a break in your routine that you can look forward to.   Perform all exercises about fifteen times, three times per day or as directed.  You should exercise both the operative leg and the other leg as well.  Exercises include:   Quad Sets - Tighten up the muscle on the front of the thigh (Quad) and hold for 5-10 seconds.   Straight Leg Raises - With your knee straight (if you were given a brace, keep it on), lift the leg to 60 degrees, hold for 3 seconds, and slowly lower the leg.  Perform this exercise against  resistance later as your leg gets stronger.  Leg Slides: Lying on your back, slowly slide your foot toward your buttocks, bending your knee up off the floor (only go as far as is comfortable). Then slowly slide your foot back down until your leg is flat on the floor again.  Angel Wings: Lying on your back spread your legs to the side as far apart as you can without causing discomfort.  Hamstring Strength:  Lying on your back, push your heel against the floor with your leg straight by tightening up the muscles of your buttocks.  Repeat, but this time bend your knee to a comfortable angle, and push your heel against the floor.  You may put a pillow under the heel to make it more comfortable if necessary.   A rehabilitation program following joint replacement surgery can speed recovery and prevent re-injury in the future due to weakened muscles. Contact your doctor or a physical therapist for more information  on knee rehabilitation.    CONSTIPATION  Constipation is defined medically as fewer than three stools per week and severe constipation as less than one stool per week.  Even if you have a regular bowel pattern at home, your normal regimen is likely to be disrupted due to multiple reasons following surgery.  Combination of anesthesia, postoperative narcotics, change in appetite and fluid intake all can affect your bowels.   YOU MUST use at least one of the following options; they are listed in order of increasing strength to get the job done.  They are all available over the counter, and you may need to use some, POSSIBLY even all of these options:    Drink plenty of fluids (prune juice may be helpful) and high fiber foods Colace 100 mg by mouth twice a day  Senokot for constipation as directed and as needed Dulcolax (bisacodyl), take with full glass of water  Miralax (polyethylene glycol) once or twice a day as needed.  If you have tried all these things and are unable to have a bowel movement  in the first 3-4 days after surgery call either your surgeon or your primary doctor.    If you experience loose stools or diarrhea, hold the medications until you stool forms back up.  If your symptoms do not get better within 1 week or if they get worse, check with your doctor.  If you experience "the worst abdominal pain ever" or develop nausea or vomiting, please contact the office immediately for further recommendations for treatment.   ITCHING:  If you experience itching with your medications, try taking only a single pain pill, or even half a pain pill at a time.  You can also use Benadryl over the counter for itching or also to help with sleep.   TED HOSE STOCKINGS:  Use stockings on both legs until for at least 2 weeks or as directed by physician office. They may be removed at night for sleeping.  MEDICATIONS:  See your medication summary on the "After Visit Summary" that nursing will review with you.  You may have some home medications which will be placed on hold until you complete the course of blood thinner medication.  It is important for you to complete the blood thinner medication as prescribed.  PRECAUTIONS:  If you experience chest pain or shortness of breath - call 911 immediately for transfer to the hospital emergency department.   If you develop a fever greater that 101 F, purulent drainage from wound, increased redness or drainage from wound, foul odor from the wound/dressing, or calf pain - CONTACT YOUR SURGEON.                                                   FOLLOW-UP APPOINTMENTS:  If you do not already have a post-op appointment, please call the office for an appointment to be seen by your surgeon.  Guidelines for how soon to be seen are listed in your "After Visit Summary", but are typically between 1-4 weeks after surgery.  OTHER INSTRUCTIONS:   Knee Replacement:  Do not place pillow under knee, focus on keeping the knee straight while resting. CPM instructions: 0-90  degrees, 2 hours in the morning, 2 hours in the afternoon, and 2 hours in the evening. Place foam block, curve side up under heel at all  times except when in CPM or when walking.  DO NOT modify, tear, cut, or change the foam block in any way.  POST-OPERATIVE OPIOID TAPER INSTRUCTIONS: It is important to wean off of your opioid medication as soon as possible. If you do not need pain medication after your surgery it is ok to stop day one. Opioids include: Codeine, Hydrocodone(Norco, Vicodin), Oxycodone(Percocet, oxycontin) and hydromorphone amongst others.  Long term and even short term use of opiods can cause: Increased pain response Dependence Constipation Depression Respiratory depression And more.  Withdrawal symptoms can include Flu like symptoms Nausea, vomiting And more Techniques to manage these symptoms Hydrate well Eat regular healthy meals Stay active Use relaxation techniques(deep breathing, meditating, yoga) Do Not substitute Alcohol to help with tapering If you have been on opioids for less than two weeks and do not have pain than it is ok to stop all together.  Plan to wean off of opioids This plan should start within one week post op of your joint replacement. Maintain the same interval or time between taking each dose and first decrease the dose.  Cut the total daily intake of opioids by one tablet each day Next start to increase the time between doses. The last dose that should be eliminated is the evening dose.   MAKE SURE YOU:  Understand these instructions.  Get help right away if you are not doing well or get worse.    Thank you for letting us be a part of your medical care team.  It is a privilege we respect greatly.  We hope these instructions will help you stay on track for a fast and full recovery!    Dental Antibiotics:  In most cases prophylactic antibiotics for Dental procdeures after total joint surgery are not necessary.  Exceptions are as  follows:  1. History of prior total joint infection  2. Severely immunocompromised (Organ Transplant, cancer chemotherapy, Rheumatoid biologic meds such as [place])  3. Poorly controlled diabetes (A1C &gt; 8.0, blood glucose over 200)  If you have one of these conditions, contact your surgeon for an antibiotic prescription, prior to your dental procedure.   Increase activity slowly as tolerated   Complete by: As directed    Post-operative opioid taper instructions:   Complete by: As directed    POST-OPERATIVE OPIOID TAPER INSTRUCTIONS: It is important to wean off of your opioid medication as soon as possible. If you do not need pain medication after your surgery it is ok to stop day one. Opioids include: Codeine, Hydrocodone(Norco, Vicodin), Oxycodone(Percocet, oxycontin) and hydromorphone amongst others.  Long term and even short term use of opiods can cause: Increased pain response Dependence Constipation Depression Respiratory depression And more.  Withdrawal symptoms can include Flu like symptoms Nausea, vomiting And more Techniques to manage these symptoms Hydrate well Eat regular healthy meals Stay active Use relaxation techniques(deep breathing, meditating, yoga) Do Not substitute Alcohol to help with tapering If you have been on opioids for less than two weeks and do not have pain than it is ok to stop all together.  Plan to wean off of opioids This plan should start within one week post op of your joint replacement. Maintain the same interval or time between taking each dose and first decrease the dose.  Cut the total daily intake of opioids by one tablet each day Next start to increase the time between doses. The last dose that should be eliminated is the evening dose.  Follow-up Information     Mcarthur Rossetti, MD. Go on 05/08/2021.   Specialty: Orthopedic Surgery Why: at 1:00 pm for your 2 week post op with Dr. Clarita Leber  information: [place] [address] [phone]         [facility]. Go on 04/29/2021.   Specialty: Rehabilitation Why: You are scheduled to begin Outpatient Physical Therapy at 11:15 am. Please arrive at 11:00 am for any necessary paperwork. Contact information: [address] [id number] [place] [phone]                 Signed: Mcarthur Rossetti 04/26/2021, 1:17 PM

## 2021-04-26 NOTE — TOC Transition Note (Signed)
Transition of Care [facility]) - CM/SW Discharge Note   Patient Details  Name: [name] [name] MRN: [id number] Date of Birth: October 12, 1945  [facility] CM/SW Contact:  Lennart Pall, LCSW Phone Number: 04/26/2021, 10:12 AM   Clinical Narrative:    Met briefly with pt who confirms plans for OPPT.  Has all needed DME at home.  No TOC needs.   Final next level of care: OP Rehab Barriers to Discharge: No Barriers Identified   Patient Goals and CMS Choice Patient states their goals for this hospitalization and ongoing recovery are:: return home      Discharge Placement                       Discharge Plan and Services                DME Arranged: N/A DME Agency: NA         HH Agency:  (No HHPT will be needed - patient requested to be scheduled with OPPT after surgery. Scheduled with [facility] in [place] on 04/29/21 at 11:15)        Social Determinants of Health (SDOH) Interventions     Readmission Risk Interventions No flowsheet data found.

## 2021-04-28 ENCOUNTER — Telehealth: Payer: Self-pay | Admitting: *Deleted

## 2021-04-28 NOTE — Telephone Encounter (Signed)
Ortho bundle D/C call completed. 

## 2021-04-29 ENCOUNTER — Encounter: Payer: Self-pay | Admitting: Physical Therapy

## 2021-04-29 ENCOUNTER — Ambulatory Visit: Payer: Medicare Other | Attending: Orthopaedic Surgery | Admitting: Physical Therapy

## 2021-04-29 ENCOUNTER — Other Ambulatory Visit: Payer: Self-pay

## 2021-04-29 DIAGNOSIS — R600 Localized edema: Secondary | ICD-10-CM | POA: Diagnosis not present

## 2021-04-29 DIAGNOSIS — M25662 Stiffness of left knee, not elsewhere classified: Secondary | ICD-10-CM | POA: Diagnosis not present

## 2021-04-29 DIAGNOSIS — G8929 Other chronic pain: Secondary | ICD-10-CM

## 2021-04-29 DIAGNOSIS — M25562 Pain in left knee: Secondary | ICD-10-CM | POA: Diagnosis not present

## 2021-04-29 NOTE — Therapy (Signed)
[facility] [place], [place], [address] Phone: [phone]   Fax:  [phone]  Physical Therapy Evaluation  Patient Details  Name: [name] [name] MRN: [id number] Date of Birth: 1946/01/09 Referring Provider (PT): Jean Rosenthal MD   Encounter Date: 04/29/2021   PT End of Session - 04/29/21 1214     Visit Number 1    Number of Visits 12    Date for PT Re-Evaluation 07/28/21    Authorization Type FOTO AT LEAST EVERY 5TH VISIT.  PROGRESS NOTE AT 10TH VISIT.  KX MODIFIER AFTER 15 VISITS.    PT Start Time 1111    PT Stop Time 1148    PT Time Calculation (min) 37 min    Activity Tolerance Patient tolerated treatment well    Behavior During Therapy WFL for tasks assessed/performed             Past Medical History:  Diagnosis Date   Arthritis    Asthma, chronic 06/02/2013   Carpal tunnel syndrome 04/17/2015   Bilateral   Depression 06/02/2013   DYSPNEA ON EXERTION 06/04/2009   Qualifier: Diagnosis of  By: Domenic Polite, MD, [place] Hay    Hereditary and idiopathic peripheral neuropathy 04/17/2015   Hyperlipemia 06/02/2013   Hypertension    LEG PAIN, BILATERAL 06/04/2009   Qualifier: Diagnosis of  By: Domenic Polite, MD, FACC, [facility], HX OF 06/04/2009   Qualifier: Diagnosis of  By: Domenic Polite, MD, Phillips Hay    Prostate CA [facility])    and basal cell nose    Past Surgical History:  Procedure Laterality Date   CARDIAC CATHETERIZATION  1997   EXCISION MASS NECK Left 11/12/2020   Procedure: EXCISION LEFT POSTERIOR NECK MASS;  Surgeon: Coralie Keens, MD;  Location: [place];  Service: General;  Laterality: Left;   EYE SURGERY Bilateral 2020   cataracts removed   [place] GASTRIC BANDING  [id number]   [facility] fundoplication   MENISCUS REPAIR Left 2012   ROBOT ASSISTED LAPAROSCOPIC  RADICAL PROSTATECTOMY     ROTATOR CUFF REPAIR Right 2019   TOTAL KNEE ARTHROPLASTY Right 01/03/2021   Procedure: RIGHT TOTAL KNEE ARTHROPLASTY;  Surgeon: Mcarthur Rossetti, MD;  Location[facility];  Service: Orthopedics;  Laterality: Right;  Needs RNFA    There were no vitals filed for this visit.    Subjective Assessment - 04/29/21 1221     Subjective COVID-19 screen performed prior to patient entering clinic.  The patient presents to the clinic today s/p left total knee replacement performed on 04/25/21.  His current pain-level is a 6/10 and higher when moving and bedning his knee.  Pain medication and ice decrease his pain.  He is compliant to wearing his TED hose and is walking safely with a FWW.    Pertinent History CTS, COPD, HTN, Hernia repair, left knee ATS, right RCR, right TKA.    How long can you walk comfortably? Around house.    Patient Stated Goals Return to an active lifestyle.    Currently in Pain? Yes    Pain Score 6     Pain Location Knee    Pain Orientation Left    Pain Descriptors / Indicators Aching;Throbbing;Sore    Pain Type Surgical pain    Pain Onset More than a month ago    Pain Frequency Constant    Aggravating Factors  See  above.    Pain Relieving Factors See above.                [facility] PT Assessment - 04/29/21 0001       Assessment   Medical Diagnosis Left total knee replacement.    Referring Provider (PT) Jean Rosenthal MD    Onset Date/Surgical Date --   04/25/21 (surgery date).     Precautions   Precaution Comments No ultrasound.      Restrictions   Weight Bearing Restrictions No      Balance Screen   Has the patient fallen in the past 6 months No    Has the patient had a decrease in activity level because of a fear of falling?  No    Is the patient reluctant to leave their home because of a fear of falling?  No      Home Environment   Living Environment Private residence      Prior Function   Level of Independence  Independent      Observation/Other Assessments   Observations Aquacel intact over patient's left knee.    Focus on Therapeutic Outcomes (FOTO)  Complete.      Observation/Other Assessments-Edema    Edema Circumferential      Circumferential Edema   Circumferential - Right LT is 5 cms > RT.      AROM   Overall AROM Comments Equal bilateral knee extension supine on treatment table.  Active left knee flexion to 90 degrees.      Strength   Overall Strength Comments Left hip and knee strength is 4+/5.      Palpation   Palpation comment Diffuse left knee pain reported currently.      Ambulation/Gait   Gait Comments Step-to gait pattern with a FWW.                        Objective measurements completed on examination: See above findings.       [facility] Adult PT Treatment/Exercise - 04/29/21 0001       Vasopneumatic   Number Minutes Vasopneumatic  15 minutes    Vasopnuematic Location  --   Left knee.   Vasopneumatic Pressure Low                         PT Long Term Goals - 04/29/21 1233       PT LONG TERM GOAL #1   Title Independent with a HEP.    Time 6    Period Weeks    Status New      PT LONG TERM GOAL #3   Title Active left knee flexion to 120 to 125 degrees+ so the patient can perform functional tasks and do so with pain not > 2-3/10.    Time 6    Period Weeks    Status New      PT LONG TERM GOAL #4   Title Perform a reciprocating stair gait with one railing with pain not > 2-3/10.    Time 6    Period Weeks    Status New                    Plan - 04/29/21 1230     Clinical Impression Statement The patient presents to OPPT s/p left total keen replacement performed on 04/25/21.  He demonstrates equal bilateral knee extension assessed in supine and active flexion to 90 degrees.  He  is walking safely with a FWW with a step-to gait pattern currently.  He has a moderate amount of edema and has diffuse pain complaints  currently.  Patient will benefit from skilled physical therapy intervention to address pain and deficits.    Personal Factors and Comorbidities Comorbidity 1;Comorbidity 2;Other    Comorbidities CTS, COPD, HTN, Hernia repair, left knee ATS, right RCR, right TKA.    Examination-Activity Limitations Other;Locomotion Level    Examination-Participation Restrictions Other    Stability/Clinical Decision Making Stable/Uncomplicated    Clinical Decision Making Low    Rehab Potential Excellent    PT Frequency --   12 visits.   PT Treatment/Interventions ADLs/Self Care Home Management;Cryotherapy;Electrical Stimulation;Gait training;Stair training;Functional mobility training;Therapeutic activities;Therapeutic exercise;Neuromuscular re-education;Manual techniques;Patient/family education;Passive range of motion;Vasopneumatic Device    PT Next Visit Plan Progress per TKA protocol.  Vasopneumatic.    Consulted and Agree with Plan of Care Patient             Patient will benefit from skilled therapeutic intervention in order to improve the following deficits and impairments:  Abnormal gait, Decreased activity tolerance, Decreased range of motion, Decreased strength, Increased edema, Pain  Visit Diagnosis: Chronic pain of left knee - Plan: PT plan of care cert/re-cert  Stiffness of left knee, not elsewhere classified - Plan: PT plan of care cert/re-cert  Localized edema - Plan: PT plan of care cert/re-cert     Problem List Patient Active Problem List   Diagnosis Date Noted   Status post total left knee replacement 04/25/2021   Real time reverse transcriptase PCR positive for COVID-19 virus 03/21/2021   Upper respiratory infection with cough and congestion 03/19/2021   Status post total right knee replacement 01/03/2021   Left lower quadrant pain 09/25/2020   Diverticular disease of colon 09/25/2020   Colon cancer screening 09/25/2020   Statin intolerance 12/27/2018   BMI 28.0-28.9,adult  11/01/2018   H/O arthroscopy of shoulder 02/10/2018   Primary osteoarthritis of left knee 11/25/2016   Primary osteoarthritis of right knee 11/25/2016   Essential hypertension 06/28/2015   H/O prostate cancer 06/28/2015   Hereditary and idiopathic peripheral neuropathy 04/17/2015   Carpal tunnel syndrome 04/17/2015   Hyperlipemia 06/02/2013   Asthma, chronic 06/02/2013   Depression 06/02/2013   GERD 06/04/2009   LEG PAIN, BILATERAL 06/04/2009   PERCUTANEOUS TRANSLUMINAL CORONARY ANGIOPLASTY, HX OF 06/04/2009    Antionetta Ator, [place] MPT 04/29/2021, 12:36 PM  [facility] [address] [place], [place], [address] Phone: [phone]   Fax:  [phone]  Name: MARQUAN HAGIN MRN: [id number] Date of Birth: 1945-09-15

## 2021-05-01 ENCOUNTER — Other Ambulatory Visit: Payer: Self-pay

## 2021-05-01 ENCOUNTER — Ambulatory Visit: Payer: Medicare Other | Admitting: *Deleted

## 2021-05-01 DIAGNOSIS — G8929 Other chronic pain: Secondary | ICD-10-CM

## 2021-05-01 DIAGNOSIS — M25662 Stiffness of left knee, not elsewhere classified: Secondary | ICD-10-CM

## 2021-05-01 DIAGNOSIS — R600 Localized edema: Secondary | ICD-10-CM

## 2021-05-01 DIAGNOSIS — M25562 Pain in left knee: Secondary | ICD-10-CM | POA: Diagnosis not present

## 2021-05-01 NOTE — Therapy (Signed)
[facility] [place], [place], [address] Phone: [phone]   Fax:  [phone]  Physical Therapy Treatment  Patient Details  Name: [name] [name] MRN: [id number] Date of Birth: [birth year] Referring Provider (PT): Jean Rosenthal MD   Encounter Date: 05/01/2021   PT End of Session - 05/01/21 1125     Visit Number 2    Number of Visits 12    Date for PT Re-Evaluation 07/28/21    Authorization Type FOTO AT LEAST EVERY 5TH VISIT.  PROGRESS NOTE AT 10TH VISIT.  KX MODIFIER AFTER 15 VISITS.    PT Start Time 1115    PT Stop Time 1203    PT Time Calculation (min) 48 min             Past Medical History:  Diagnosis Date   Arthritis    Asthma, chronic 06/02/2013   Carpal tunnel syndrome 04/17/2015   Bilateral   Depression 06/02/2013   DYSPNEA ON EXERTION 06/04/2009   Qualifier: Diagnosis of  By: Domenic Polite, MD, [place] Hay    Hereditary and idiopathic peripheral neuropathy 04/17/2015   Hyperlipemia 06/02/2013   Hypertension    LEG PAIN, BILATERAL 06/04/2009   Qualifier: Diagnosis of  By: Domenic Polite, MD, FACC, [place], HX OF 06/04/2009   Qualifier: Diagnosis of  By: Domenic Polite, MD, Phillips Hay    Prostate CA [facility])    and basal cell nose    Past Surgical History:  Procedure Laterality Date   CARDIAC CATHETERIZATION  1997   EXCISION MASS NECK Left 11/12/2020   Procedure: EXCISION LEFT POSTERIOR NECK MASS;  Surgeon: Coralie Keens, MD;  Location: [place];  Service: General;  Laterality: Left;   EYE SURGERY Bilateral 2020   cataracts removed   [place] GASTRIC BANDING  [id number]   [facility] fundoplication   MENISCUS REPAIR Left 2012   ROBOT ASSISTED LAPAROSCOPIC RADICAL PROSTATECTOMY     ROTATOR CUFF REPAIR Right 2019   TOTAL KNEE ARTHROPLASTY Right 01/03/2021   Procedure: RIGHT  TOTAL KNEE ARTHROPLASTY;  Surgeon: Mcarthur Rossetti, MD;  [facility];  Service: Orthopedics;  Laterality: Right;  Needs RNFA   TOTAL KNEE ARTHROPLASTY Left 04/25/2021   Procedure: LEFT TOTAL KNEE ARTHROPLASTY;  Surgeon: Mcarthur Rossetti, MD;  [facility];  Service: Orthopedics;  Laterality: Left;    There were no vitals filed for this visit.   Subjective Assessment - 05/01/21 1122     Subjective COVID-19 screen performed prior to patient entering clinic.    Pertinent History CTS, COPD, HTN, Hernia repair, left knee ATS, right RCR, right TKA.    How long can you walk comfortably? Around house.    Patient Stated Goals Return to an active lifestyle.    Currently in Pain? Yes    Pain Score 6     Pain Location Knee    Pain Orientation Left    Pain Descriptors / Indicators Aching;Throbbing                               [facility] Adult PT Treatment/Exercise - 05/01/21 0001       Knee/Hip Exercises: Aerobic   Nustep Level seat 12 x 15 minutes. (P)       Knee/Hip Exercises: Seated   Long Arc Quad AROM;Right;20 reps (P)  with 5 sec holds     Vasopneumatic   Number Minutes Vasopneumatic  15 minutes (P)     Vasopnuematic Location  Knee (P)     Vasopneumatic Pressure Low (P)     Vasopneumatic Temperature  34 for edema (P)       Manual Therapy   Manual Therapy Passive ROM (P)     Passive ROM flexion/extension PROM and AAROM with contract/relax technique (P)                          PT Long Term Goals - 04/29/21 1233       PT LONG TERM GOAL #1   Title Independent with a HEP.    Time 6    Period Weeks    Status New      PT LONG TERM GOAL #3   Title Active left knee flexion to 120 to 125 degrees+ so the patient can perform functional tasks and do so with pain not > 2-3/10.    Time 6    Period Weeks    Status New      PT LONG TERM GOAL #4   Title Perform a reciprocating stair gait with one railing with pain not > 2-3/10.     Time 6    Period Weeks    Status New                   Plan - 05/01/21 1126     Clinical Impression Statement Pt arrived today doing fair with LT knee, but swollen and painful. Rx focused on ROM as well as quad and HS activation. 100 degrees flexion today. Normal Vaso response today.    Personal Factors and Comorbidities Comorbidity 1;Comorbidity 2;Other    Comorbidities CTS, COPD, HTN, Hernia repair, left knee ATS, right RCR, right TKA.    Examination-Participation Restrictions Other    Stability/Clinical Decision Making Stable/Uncomplicated    Rehab Potential Excellent    PT Duration 6 weeks    PT Treatment/Interventions ADLs/Self Care Home Management;Cryotherapy;Electrical Stimulation;Gait training;Stair training;Functional mobility training;Therapeutic activities;Therapeutic exercise;Neuromuscular re-education;Manual techniques;Patient/family education;Passive range of motion;Vasopneumatic Device    PT Next Visit Plan Progress per TKA protocol.  Vasopneumatic.    Consulted and Agree with Plan of Care Patient             Patient will benefit from skilled therapeutic intervention in order to improve the following deficits and impairments:  Abnormal gait, Decreased activity tolerance, Decreased range of motion, Decreased strength, Increased edema, Pain  Visit Diagnosis: Chronic pain of left knee  Stiffness of left knee, not elsewhere classified  Localized edema     Problem List Patient Active Problem List   Diagnosis Date Noted   Status post total left knee replacement 04/25/2021   Real time reverse transcriptase PCR positive for COVID-19 virus 03/21/2021   Upper respiratory infection with cough and congestion 03/19/2021   Status post total right knee replacement 01/03/2021   Left lower quadrant pain 09/25/2020   Diverticular disease of colon 09/25/2020   Colon cancer screening 09/25/2020   Statin intolerance 12/27/2018   BMI 28.0-28.9,adult 11/01/2018    H/O arthroscopy of shoulder 02/10/2018   Primary osteoarthritis of left knee 11/25/2016   Primary osteoarthritis of right knee 11/25/2016   Essential hypertension 06/28/2015   H/O prostate cancer 06/28/2015   Hereditary and idiopathic peripheral neuropathy 04/17/2015   Carpal tunnel syndrome 04/17/2015   Hyperlipemia 06/02/2013   Asthma, chronic 06/02/2013  Depression 06/02/2013   GERD 06/04/2009   LEG PAIN, BILATERAL 06/04/2009   PERCUTANEOUS TRANSLUMINAL CORONARY ANGIOPLASTY, HX OF 06/04/2009    Nadeen Shipman,CHRIS, PTA 05/01/2021, 5:09 PM  [facility] [place], [place], [address] Phone: [phone]   Fax:  [phone]  Name: [name] [name] MRN: [id number] Date of Birth: 1946-07-31

## 2021-05-02 ENCOUNTER — Other Ambulatory Visit: Payer: Self-pay | Admitting: Orthopaedic Surgery

## 2021-05-02 ENCOUNTER — Telehealth: Payer: Self-pay | Admitting: *Deleted

## 2021-05-02 MED ORDER — OXYCODONE HCL 5 MG PO TABS: 5.0000 mg | ORAL_TABLET | ORAL | 0 refills | Status: DC | PRN

## 2021-05-02 NOTE — Telephone Encounter (Signed)
Ortho bundle 7 day call. Patient requested refill of pain medication as he may get low over the weekend. Thanks.

## 2021-05-06 ENCOUNTER — Other Ambulatory Visit: Payer: Self-pay

## 2021-05-06 ENCOUNTER — Ambulatory Visit: Payer: Medicare Other | Admitting: Physical Therapy

## 2021-05-06 DIAGNOSIS — R600 Localized edema: Secondary | ICD-10-CM | POA: Diagnosis not present

## 2021-05-06 DIAGNOSIS — M25662 Stiffness of left knee, not elsewhere classified: Secondary | ICD-10-CM | POA: Diagnosis not present

## 2021-05-06 DIAGNOSIS — M25562 Pain in left knee: Secondary | ICD-10-CM | POA: Diagnosis not present

## 2021-05-06 DIAGNOSIS — G8929 Other chronic pain: Secondary | ICD-10-CM

## 2021-05-06 NOTE — Therapy (Signed)
[facility] [place], [place], [address] Phone: [phone]   Fax:  [phone]  Physical Therapy Treatment  Patient Details  Name: [name] [name] MRN: [id number] Date of Birth: 08/21/1946 Referring Provider (PT): Jean Rosenthal MD   Encounter Date: 05/06/2021   PT End of Session - 05/06/21 1213     Visit Number 3    Number of Visits 12    Date for PT Re-Evaluation 07/28/21    Authorization Type FOTO AT LEAST EVERY 5TH VISIT.  PROGRESS NOTE AT 10TH VISIT.  KX MODIFIER AFTER 15 VISITS.    PT Start Time 1115    PT Stop Time 1159    PT Time Calculation (min) 44 min    Activity Tolerance Patient tolerated treatment well    Behavior During Therapy WFL for tasks assessed/performed             Past Medical History:  Diagnosis Date   Arthritis    Asthma, chronic 06/02/2013   Carpal tunnel syndrome 04/17/2015   Bilateral   Depression 06/02/2013   DYSPNEA ON EXERTION 06/04/2009   Qualifier: Diagnosis of  By: Domenic Polite, MD, [place] Hay    Hereditary and idiopathic peripheral neuropathy 04/17/2015   Hyperlipemia 06/02/2013   Hypertension    LEG PAIN, BILATERAL 06/04/2009   Qualifier: Diagnosis of  By: Domenic Polite, MD, FACC, [place], HX OF 06/04/2009   Qualifier: Diagnosis of  By: Domenic Polite, MD, [place] Hay    Prostate CA [facility])    and basal cell nose    Past Surgical History:  Procedure Laterality Date   CARDIAC CATHETERIZATION  1997   EXCISION MASS NECK Left 11/12/2020   Procedure: EXCISION LEFT POSTERIOR NECK MASS;  Surgeon: Coralie Keens, MD;  Location: [place];  Service: General;  Laterality: Left;   EYE SURGERY Bilateral 2020   cataracts removed   [place] GASTRIC BANDING  [id number]   [facility] fundoplication   MENISCUS REPAIR Left 2012   ROBOT ASSISTED LAPAROSCOPIC  RADICAL PROSTATECTOMY     ROTATOR CUFF REPAIR Right 2019   TOTAL KNEE ARTHROPLASTY Right 01/03/2021   Procedure: RIGHT TOTAL KNEE ARTHROPLASTY;  Surgeon: Mcarthur Rossetti, MD;  [facility];  Service: Orthopedics;  Laterality: Right;  Needs RNFA   TOTAL KNEE ARTHROPLASTY Left 04/25/2021   Procedure: LEFT TOTAL KNEE ARTHROPLASTY;  Surgeon: Mcarthur Rossetti, MD;  [facility];  Service: Orthopedics;  Laterality: Left;    There were no vitals filed for this visit.   Subjective Assessment - 05/06/21 1214     Subjective COVID-19 screen performed prior to patient entering clinic.  Want to get this swelling down.    Pertinent History CTS, COPD, HTN, Hernia repair, left knee ATS, right RCR, right TKA.    How long can you walk comfortably? Around house.    Patient Stated Goals Return to an active lifestyle.    Currently in Pain? Yes    Pain Score 6     Pain Location Knee    Pain Orientation Left    Pain Descriptors / Indicators Aching;Throbbing    Pain Onset More than a month ago                               [facility] Adult PT Treatment/Exercise - 05/06/21 0001  Exercises   Exercises Knee/Hip      Knee/Hip Exercises: Aerobic   Recumbent Bike Level 1 x 6 minutes.    Nustep Level 3 x 9 minutes.      Knee/Hip Exercises: Supine   Short Arc Quad Sets Limitations 4# x 4 minutes with 5 sec extension holds.      Vasopneumatic   Number Minutes Vasopneumatic  15 minutes    Vasopnuematic Location  --   Left knee.   Vasopneumatic Pressure Medium      Manual Therapy   Manual Therapy Passive ROM    Passive ROM In supine:  PROM x 4 minutes to patient's left knee                         PT Long Term Goals - 04/29/21 1233       PT LONG TERM GOAL #1   Title Independent with a HEP.    Time 6    Period Weeks    Status New      PT LONG TERM GOAL #3   Title Active left knee flexion to 120 to 125 degrees+ so the patient can perform  functional tasks and do so with pain not > 2-3/10.    Time 6    Period Weeks    Status New      PT LONG TERM GOAL #4   Title Perform a reciprocating stair gait with one railing with pain not > 2-3/10.    Time 6    Period Weeks    Status New                   Plan - 05/06/21 1216     Clinical Impression Statement Excellent progress today with patient able to progress to recumbent bike today.    Personal Factors and Comorbidities Comorbidity 1;Comorbidity 2;Other    Comorbidities CTS, COPD, HTN, Hernia repair, left knee ATS, right RCR, right TKA.    Examination-Activity Limitations Other;Locomotion Level    Examination-Participation Restrictions Other    Rehab Potential Excellent    PT Duration 6 weeks    PT Treatment/Interventions ADLs/Self Care Home Management;Cryotherapy;Electrical Stimulation;Gait training;Stair training;Functional mobility training;Therapeutic activities;Therapeutic exercise;Neuromuscular re-education;Manual techniques;Patient/family education;Passive range of motion;Vasopneumatic Device    PT Next Visit Plan Progress per TKA protocol.  Vasopneumatic.    Consulted and Agree with Plan of Care Patient             Patient will benefit from skilled therapeutic intervention in order to improve the following deficits and impairments:  Abnormal gait, Decreased activity tolerance, Decreased range of motion, Decreased strength, Increased edema, Pain  Visit Diagnosis: Chronic pain of left knee  Stiffness of left knee, not elsewhere classified  Localized edema     Problem List Patient Active Problem List   Diagnosis Date Noted   Status post total left knee replacement 04/25/2021   Real time reverse transcriptase PCR positive for COVID-19 virus 03/21/2021   Upper respiratory infection with cough and congestion 03/19/2021   Status post total right knee replacement 01/03/2021   Left lower quadrant pain 09/25/2020   Diverticular disease of colon  09/25/2020   Colon cancer screening 09/25/2020   Statin intolerance 12/27/2018   BMI 28.0-28.9,adult 11/01/2018   H/O arthroscopy of shoulder 02/10/2018   Primary osteoarthritis of left knee 11/25/2016   Primary osteoarthritis of right knee 11/25/2016   Essential hypertension 06/28/2015   H/O prostate cancer 06/28/2015   Hereditary and idiopathic peripheral  neuropathy 04/17/2015   Carpal tunnel syndrome 04/17/2015   Hyperlipemia 06/02/2013   Asthma, chronic 06/02/2013   Depression 06/02/2013   GERD 06/04/2009   LEG PAIN, BILATERAL 06/04/2009   PERCUTANEOUS TRANSLUMINAL CORONARY ANGIOPLASTY, HX OF 06/04/2009    Gladine Plude, [place] MPT 05/06/2021, 12:17 PM  [facility] [place], [place], [address] Phone: [phone]   Fax:  [phone]  Name: [name] [name] MRN: [id number] Date of Birth: 1945/10/13

## 2021-05-08 ENCOUNTER — Other Ambulatory Visit: Payer: Self-pay

## 2021-05-08 ENCOUNTER — Ambulatory Visit (INDEPENDENT_AMBULATORY_CARE_PROVIDER_SITE_OTHER): Payer: Medicare Other | Admitting: Orthopaedic Surgery

## 2021-05-08 ENCOUNTER — Ambulatory Visit: Payer: Medicare Other | Admitting: *Deleted

## 2021-05-08 ENCOUNTER — Encounter: Payer: Self-pay | Admitting: Orthopaedic Surgery

## 2021-05-08 DIAGNOSIS — M25662 Stiffness of left knee, not elsewhere classified: Secondary | ICD-10-CM

## 2021-05-08 DIAGNOSIS — G8929 Other chronic pain: Secondary | ICD-10-CM

## 2021-05-08 DIAGNOSIS — R600 Localized edema: Secondary | ICD-10-CM | POA: Diagnosis not present

## 2021-05-08 DIAGNOSIS — Z96652 Presence of left artificial knee joint: Secondary | ICD-10-CM

## 2021-05-08 DIAGNOSIS — M25562 Pain in left knee: Secondary | ICD-10-CM | POA: Diagnosis not present

## 2021-05-08 NOTE — Therapy (Signed)
[facility] [place], [place], [address] Phone: [phone]   Fax:  [phone]  Physical Therapy Treatment  Patient Details  Name: [name] [name] MRN: [id number] Date of Birth: 1945/09/28 Referring Provider (PT): Jean Rosenthal MD   Encounter Date: 05/08/2021   PT End of Session - 05/08/21 1119     Visit Number 4    Number of Visits 12    Date for PT Re-Evaluation 07/28/21    Authorization Type FOTO AT LEAST EVERY 5TH VISIT.  PROGRESS NOTE AT 10TH VISIT.  KX MODIFIER AFTER 15 VISITS.    PT Start Time 1030    PT Stop Time 1120    PT Time Calculation (min) 50 min             Past Medical History:  Diagnosis Date   Arthritis    Asthma, chronic 06/02/2013   Carpal tunnel syndrome 04/17/2015   Bilateral   Depression 06/02/2013   DYSPNEA ON EXERTION 06/04/2009   Qualifier: Diagnosis of  By: Domenic Polite, MD, Phillips Hay    Hereditary and idiopathic peripheral neuropathy 04/17/2015   Hyperlipemia 06/02/2013   Hypertension    LEG PAIN, BILATERAL 06/04/2009   Qualifier: Diagnosis of  By: Domenic Polite, MD, FACC, [place], HX OF 06/04/2009   Qualifier: Diagnosis of  By: Domenic Polite, MD, Phillips Hay    Prostate CA [facility])    and basal cell nose    Past Surgical History:  Procedure Laterality Date   CARDIAC CATHETERIZATION  1997   EXCISION MASS NECK Left 11/12/2020   Procedure: EXCISION LEFT POSTERIOR NECK MASS;  Surgeon: Coralie Keens, MD;  Location: [place];  Service: General;  Laterality: Left;   EYE SURGERY Bilateral 2020   cataracts removed   [place] GASTRIC BANDING  [id number]   [facility] fundoplication   MENISCUS REPAIR Left 2012   ROBOT ASSISTED LAPAROSCOPIC RADICAL PROSTATECTOMY     ROTATOR CUFF REPAIR Right 2019   TOTAL KNEE ARTHROPLASTY Right 01/03/2021   Procedure: RIGHT  TOTAL KNEE ARTHROPLASTY;  Surgeon: Mcarthur Rossetti, MD;  [facility];  Service: Orthopedics;  Laterality: Right;  Needs RNFA   TOTAL KNEE ARTHROPLASTY Left 04/25/2021   Procedure: LEFT TOTAL KNEE ARTHROPLASTY;  Surgeon: Mcarthur Rossetti, MD;  [facility];  Service: Orthopedics;  Laterality: Left;    There were no vitals filed for this visit.   Subjective Assessment - 05/08/21 1026     Subjective COVID-19 screen performed prior to patient entering clinic.  Want to get this swelling down.Did good after last Rx. To MD today    Pertinent History CTS, COPD, HTN, Hernia repair, left knee ATS, right RCR, right TKA.    How long can you walk comfortably? Around house.    Patient Stated Goals Return to an active lifestyle.    Currently in Pain? Yes    Pain Score 5     Pain Location Knee    Pain Orientation Left    Pain Onset More than a month ago                               [facility] Adult PT Treatment/Exercise - 05/08/21 0001       Exercises   Exercises Knee/Hip      Knee/Hip Exercises: Aerobic   Recumbent  Bike Level 1 x seat 10,9    x 15 mins   minutes.      Knee/Hip Exercises: Standing   Rocker Board 3 minutes      Knee/Hip Exercises: Seated   Long Arc Quad AROM;Right;20 reps   with 5 sec holds     Knee/Hip Exercises: Supine   Short Arc Quad Sets Strengthening;Left;2 sets;10 reps   hold 5 secs   Short Arc Quad Sets Limitations 4#      Vasopneumatic   Number Minutes Vasopneumatic  15 minutes    Vasopnuematic Location  Knee   Left knee.   Vasopneumatic Pressure Medium    Vasopneumatic Temperature  34 for edema      Manual Therapy   Manual Therapy Passive ROM;Soft tissue mobilization    Manual therapy comments PROM  8-115 degrees    Soft tissue mobilization STW to posterior aspect/calf    Passive ROM In supine:  PROM/AAROM for extension with quad activation.  In sitting for flexion with active HS curl                          PT Long Term Goals - 04/29/21 1233       PT LONG TERM GOAL #1   Title Independent with a HEP.    Time 6    Period Weeks    Status New      PT LONG TERM GOAL #3   Title Active left knee flexion to 120 to 125 degrees+ so the patient can perform functional tasks and do so with pain not > 2-3/10.    Time 6    Period Weeks    Status New      PT LONG TERM GOAL #4   Title Perform a reciprocating stair gait with one railing with pain not > 2-3/10.    Time 6    Period Weeks    Status New                   Plan - 05/08/21 1120     Clinical Impression Statement Pt arrived today doing fairly well and was able to ride the bike today with full revolutions. Rx focused on ROM and quad activation as well as edema control with Vaso. To MD today. PROM was 8-115 degrees.    Personal Factors and Comorbidities Comorbidity 1;Comorbidity 2;Other    Comorbidities CTS, COPD, HTN, Hernia repair, left knee ATS, right RCR, right TKA.    Examination-Activity Limitations Other;Locomotion Level    Examination-Participation Restrictions Other    Stability/Clinical Decision Making Stable/Uncomplicated    Rehab Potential Excellent    PT Duration 6 weeks    PT Treatment/Interventions ADLs/Self Care Home Management;Cryotherapy;Electrical Stimulation;Gait training;Stair training;Functional mobility training;Therapeutic activities;Therapeutic exercise;Neuromuscular re-education;Manual techniques;Patient/family education;Passive range of motion;Vasopneumatic Device    PT Next Visit Plan Progress per TKA protocol.  Vasopneumatic.   MD progress note             Patient will benefit from skilled therapeutic intervention in order to improve the following deficits and impairments:  Abnormal gait, Decreased activity tolerance, Decreased range of motion, Decreased strength, Increased edema, Pain  Visit Diagnosis: Chronic pain of left knee  Stiffness of left knee, not elsewhere  classified  Localized edema     Problem List Patient Active Problem List   Diagnosis Date Noted   Status post total left knee replacement 04/25/2021   Real time reverse transcriptase PCR positive for COVID-19 virus 03/21/2021  Upper respiratory infection with cough and congestion 03/19/2021   Status post total right knee replacement 01/03/2021   Left lower quadrant pain 09/25/2020   Diverticular disease of colon 09/25/2020   Colon cancer screening 09/25/2020   Statin intolerance 12/27/2018   BMI 28.0-28.9,adult 11/01/2018   H/O arthroscopy of shoulder 02/10/2018   Primary osteoarthritis of left knee 11/25/2016   Primary osteoarthritis of right knee 11/25/2016   Essential hypertension 06/28/2015   H/O prostate cancer 06/28/2015   Hereditary and idiopathic peripheral neuropathy 04/17/2015   Carpal tunnel syndrome 04/17/2015   Hyperlipemia 06/02/2013   Asthma, chronic 06/02/2013   Depression 06/02/2013   GERD 06/04/2009   LEG PAIN, BILATERAL 06/04/2009   PERCUTANEOUS TRANSLUMINAL CORONARY ANGIOPLASTY, HX OF 06/04/2009    Jasiel Belisle,CHRIS, PTA 05/08/2021, 11:50 AM  [facility] [address] [place], [place], [address] Phone: [phone]   Fax:  [phone]  Name: [name] [name] MRN: [id number] Date of Birth: Feb 03, 1946

## 2021-05-08 NOTE — Progress Notes (Signed)
The patient comes in today at 2 weeks tomorrow status post a left total knee arthroplasty.  He is ambulate with a cane.  He reports good range of motion and strength and he is already in outpatient physical therapy.  He has worked hard with therapy and is already flexing his knee well past 90 degrees.  He has been on a baby aspirin twice a day.  He said most of his pain is more at night.  He is highly motivated and has done well with his right total knee arthroplasty.  Examination left knee shows some moderate swelling to be expected.  I removed all staples in place Steri-Strips from the incision.  His calves are soft.  He is wearing TED hose.  He will still work on his outpatient physical therapy in terms of range of motion, balance and coordination with strengthening of the left knee.  We will see him back in 4 weeks to see how he is doing overall but no x-rays are needed.  If there are any issues before then they know to let us know.

## 2021-05-09 ENCOUNTER — Telehealth: Payer: Self-pay | Admitting: *Deleted

## 2021-05-09 NOTE — Telephone Encounter (Signed)
Ortho bundle 14 day in office appointment completed.

## 2021-05-12 ENCOUNTER — Telehealth: Payer: Self-pay | Admitting: *Deleted

## 2021-05-12 ENCOUNTER — Other Ambulatory Visit: Payer: Self-pay | Admitting: Orthopaedic Surgery

## 2021-05-12 MED ORDER — OXYCODONE HCL 5 MG PO TABS: 5.0000 mg | ORAL_TABLET | ORAL | 0 refills | Status: DC | PRN

## 2021-05-12 MED ORDER — METHOCARBAMOL 500 MG PO TABS: 500.0000 mg | ORAL_TABLET | Freq: Four times a day (QID) | ORAL | 1 refills | Status: DC | PRN

## 2021-05-12 NOTE — Telephone Encounter (Signed)
Patient called about refill of pain medication and muscle relaxer. Thanks.

## 2021-05-13 ENCOUNTER — Encounter: Payer: Medicare Other | Admitting: Physical Therapy

## 2021-05-13 ENCOUNTER — Telehealth: Payer: Self-pay | Admitting: *Deleted

## 2021-05-13 NOTE — Telephone Encounter (Signed)
Ortho bundle call from patient:  Patient called this morning and states he has stepped wrong and really "tweaked" his back. He has now had his 2nd total knee replacement with Dr. Ninfa Linden (last one on 04/25/21-Left TKA); he is very motivated and states he feels he can't do any therapy at this point until his back calms down. I know you guys have seen him multiple times for his back injections and he is aware that he will eventually have to have something done for the back, but he wanted to have his knees done first. Any way to get him seen soon? Thanks.

## 2021-05-13 NOTE — Telephone Encounter (Signed)
Please advise- OV vs injection?

## 2021-05-13 NOTE — Telephone Encounter (Signed)
Scheduled for 9/1 at 1600 with driver and no blood thinners.

## 2021-05-15 ENCOUNTER — Encounter: Payer: Self-pay | Admitting: Physical Medicine and Rehabilitation

## 2021-05-15 ENCOUNTER — Other Ambulatory Visit: Payer: Self-pay

## 2021-05-15 ENCOUNTER — Encounter: Payer: Medicare Other | Admitting: *Deleted

## 2021-05-15 ENCOUNTER — Ambulatory Visit (INDEPENDENT_AMBULATORY_CARE_PROVIDER_SITE_OTHER): Payer: Medicare Other | Admitting: Physical Medicine and Rehabilitation

## 2021-05-15 ENCOUNTER — Ambulatory Visit: Payer: Self-pay

## 2021-05-15 VITALS — BP 155/74 | HR 67

## 2021-05-15 DIAGNOSIS — M48062 Spinal stenosis, lumbar region with neurogenic claudication: Secondary | ICD-10-CM

## 2021-05-15 MED ORDER — BETAMETHASONE SOD PHOS & ACET 6 (3-3) MG/ML IJ SUSP
12.0000 mg | Freq: Once | INTRAMUSCULAR | Status: AC
  Administered 2021-05-15: 12 mg

## 2021-05-15 NOTE — Patient Instructions (Signed)

## 2021-05-15 NOTE — Progress Notes (Signed)
Felt pop in back on Friday or Saturday of last week. Now having back pain on both sides. Pain radiates to anterior thighs and down left leg to below knee.  Numeric Pain Rating Scale and Functional Assessment Average Pain 7   In the last MONTH (on 0-10 scale) has pain interfered with the following?  1. General activity like being  able to carry out your everyday physical activities such as walking, climbing stairs, carrying groceries, or moving a chair?  Rating(10)   +Driver, -BT, -Dye Allergies.

## 2021-05-16 ENCOUNTER — Ambulatory Visit: Payer: Medicare Other | Attending: Orthopaedic Surgery | Admitting: *Deleted

## 2021-05-16 DIAGNOSIS — G8929 Other chronic pain: Secondary | ICD-10-CM | POA: Insufficient documentation

## 2021-05-16 DIAGNOSIS — R600 Localized edema: Secondary | ICD-10-CM | POA: Insufficient documentation

## 2021-05-16 DIAGNOSIS — M25562 Pain in left knee: Secondary | ICD-10-CM | POA: Diagnosis not present

## 2021-05-16 DIAGNOSIS — M25662 Stiffness of left knee, not elsewhere classified: Secondary | ICD-10-CM | POA: Diagnosis not present

## 2021-05-16 NOTE — Therapy (Signed)
[facility] [place], [place], [address] Phone: [phone]   Fax:  [phone]  Physical Therapy Treatment  Patient Details  Name: [name] [name] MRN: [id number] Date of Birth: [birth year] Referring Provider (PT): Jean Rosenthal MD   Encounter Date: 05/16/2021   PT End of Session - 05/16/21 1134     Visit Number 5    Number of Visits 12    Date for PT Re-Evaluation 07/28/21    Authorization Type FOTO AT LEAST EVERY 5TH VISIT.  PROGRESS NOTE AT 10TH VISIT.  KX MODIFIER AFTER 15 VISITS.    PT Start Time 1115    PT Stop Time 1207    PT Time Calculation (min) 52 min             Past Medical History:  Diagnosis Date   Arthritis    Asthma, chronic 06/02/2013   Carpal tunnel syndrome 04/17/2015   Bilateral   Depression 06/02/2013   DYSPNEA ON EXERTION 06/04/2009   Qualifier: Diagnosis of  By: Domenic Polite, MD, Phillips Hay    Hereditary and idiopathic peripheral neuropathy 04/17/2015   Hyperlipemia 06/02/2013   Hypertension    LEG PAIN, BILATERAL 06/04/2009   Qualifier: Diagnosis of  By: Domenic Polite, MD, FACC, [place], HX OF 06/04/2009   Qualifier: Diagnosis of  By: Domenic Polite, MD, Phillips Hay    Prostate CA [facility])    and basal cell nose    Past Surgical History:  Procedure Laterality Date   CARDIAC CATHETERIZATION  1997   EXCISION MASS NECK Left 11/12/2020   Procedure: EXCISION LEFT POSTERIOR NECK MASS;  Surgeon: Coralie Keens, MD;  Location: [facility];  Service: General;  Laterality: Left;   EYE SURGERY Bilateral 2020   cataracts removed   [place] GASTRIC BANDING  [id number]   [facility] fundoplication   MENISCUS REPAIR Left 2012   ROBOT ASSISTED LAPAROSCOPIC RADICAL PROSTATECTOMY     ROTATOR CUFF REPAIR Right 2019   TOTAL KNEE ARTHROPLASTY Right 01/03/2021   Procedure: RIGHT  TOTAL KNEE ARTHROPLASTY;  Surgeon: Mcarthur Rossetti, MD;  [facility];  Service: Orthopedics;  Laterality: Right;  Needs RNFA   TOTAL KNEE ARTHROPLASTY Left 04/25/2021   Procedure: LEFT TOTAL KNEE ARTHROPLASTY;  Surgeon: Mcarthur Rossetti, MD;  [facility];  Service: Orthopedics;  Laterality: Left;    There were no vitals filed for this visit.   Subjective Assessment - 05/16/21 1132     Subjective COVID-19 screen performed prior to patient entering clinic.  Want to get this swelling down.Did good after last Rx. Doing good today. Had a back injection and doing better    Pertinent History CTS, COPD, HTN, Hernia repair, left knee ATS, right RCR, right TKA.    How long can you walk comfortably? Around house.    Patient Stated Goals Return to an active lifestyle.    Currently in Pain? Yes    Pain Score 5     Pain Location Knee    Pain Orientation Left    Pain Descriptors / Indicators Aching;Throbbing    Pain Type Surgical pain    Pain Onset More than a month ago                               [facility] Adult PT Treatment/Exercise - 05/16/21 0001  Exercises   Exercises Knee/Hip      Knee/Hip Exercises: Aerobic   Recumbent Bike Level 1 x seat 10,9    x 20 mins   minutes.      Knee/Hip Exercises: Standing   Lateral Step Up Right;Hand Hold: 2;Step Height: 6";3 sets;10 reps    Forward Step Up Right;Step Height: 6";3 sets;10 reps      Vasopneumatic   Number Minutes Vasopneumatic  15 minutes    Vasopnuematic Location  Knee    Vasopneumatic Pressure Medium    Vasopneumatic Temperature  34 for edema      Manual Therapy   Manual Therapy Passive ROM;Soft tissue mobilization    Manual therapy comments PROM  8-115 degrees    Passive ROM In supine:  patella mobs, passive extension with active quad sets                         PT Long Term Goals - 04/29/21 1233       PT LONG TERM GOAL #1   Title Independent with a HEP.    Time  6    Period Weeks    Status New      PT LONG TERM GOAL #3   Title Active left knee flexion to 120 to 125 degrees+ so the patient can perform functional tasks and do so with pain not > 2-3/10.    Time 6    Period Weeks    Status New      PT LONG TERM GOAL #4   Title Perform a reciprocating stair gait with one railing with pain not > 2-3/10.    Time 6    Period Weeks    Status New                   Plan - 05/16/21 1134     Clinical Impression Statement Pt arrived today doing better with LT knee. He was able to perform bike today and work on strengthening for for LT knee. His ROM was great still and did great today. Normal Vaso response.    Personal Factors and Comorbidities Comorbidity 1;Comorbidity 2;Other    Comorbidities CTS, COPD, HTN, Hernia repair, left knee ATS, right RCR, right TKA.    Rehab Potential Excellent    PT Duration 6 weeks    PT Next Visit Plan Progress per TKA protocol.  Vasopneumatic.   MD progress note    Consulted and Agree with Plan of Care Patient             Patient will benefit from skilled therapeutic intervention in order to improve the following deficits and impairments:  Abnormal gait, Decreased activity tolerance, Decreased range of motion, Decreased strength, Increased edema, Pain  Visit Diagnosis: Chronic pain of left knee  Stiffness of left knee, not elsewhere classified     Problem List Patient Active Problem List   Diagnosis Date Noted   Status post total left knee replacement 04/25/2021   Real time reverse transcriptase PCR positive for COVID-19 virus 03/21/2021   Upper respiratory infection with cough and congestion 03/19/2021   Status post total right knee replacement 01/03/2021   Left lower quadrant pain 09/25/2020   Diverticular disease of colon 09/25/2020   Colon cancer screening 09/25/2020   Statin intolerance 12/27/2018   BMI 28.0-28.9,adult 11/01/2018   H/O arthroscopy of shoulder 02/10/2018   Primary  osteoarthritis of left knee 11/25/2016   Primary osteoarthritis of right knee 11/25/2016   Essential  hypertension 06/28/2015   H/O prostate cancer 06/28/2015   Hereditary and idiopathic peripheral neuropathy 04/17/2015   Carpal tunnel syndrome 04/17/2015   Hyperlipemia 06/02/2013   Asthma, chronic 06/02/2013   Depression 06/02/2013   GERD 06/04/2009   LEG PAIN, BILATERAL 06/04/2009   PERCUTANEOUS TRANSLUMINAL CORONARY ANGIOPLASTY, HX OF 06/04/2009    Vitor Overbaugh,CHRIS, PTA 05/16/2021, 12:28 PM  [facility] [place], [place], [address] Phone: [phone]   Fax:  [phone]  Name: OLAF DEILY MRN: [id number] Date of Birth: October 18, 1945

## 2021-05-20 ENCOUNTER — Ambulatory Visit: Payer: Medicare Other | Admitting: *Deleted

## 2021-05-20 ENCOUNTER — Other Ambulatory Visit: Payer: Self-pay

## 2021-05-20 DIAGNOSIS — M25662 Stiffness of left knee, not elsewhere classified: Secondary | ICD-10-CM | POA: Diagnosis not present

## 2021-05-20 DIAGNOSIS — M25562 Pain in left knee: Secondary | ICD-10-CM

## 2021-05-20 DIAGNOSIS — R600 Localized edema: Secondary | ICD-10-CM

## 2021-05-20 DIAGNOSIS — G8929 Other chronic pain: Secondary | ICD-10-CM | POA: Diagnosis not present

## 2021-05-20 NOTE — Therapy (Signed)
[facility] [place], [place], [address] Phone: [phone]   Fax:  [phone]  Physical Therapy Treatment  Patient Details  Name: [name] [name] MRN: [id number] Date of Birth: [birth year] Referring Provider (PT): [name] Rosenthal MD   Encounter Date: 05/20/2021   PT End of Session - 05/20/21 1127     Visit Number 6    Number of Visits 12    Date for PT Re-Evaluation 07/28/21    Authorization Type FOTO AT LEAST EVERY 5TH VISIT.  PROGRESS NOTE AT 10TH VISIT.  KX MODIFIER AFTER 15 VISITS.    PT Start Time 1115    PT Stop Time 1211    PT Time Calculation (min) 56 min             Past Medical History:  Diagnosis Date   Arthritis    Asthma, chronic 06/02/2013   Carpal tunnel syndrome 04/17/2015   Bilateral   Depression 06/02/2013   DYSPNEA ON EXERTION 06/04/2009   Qualifier: Diagnosis of  By: Domenic Polite, MD, Phillips Hay    Hereditary and idiopathic peripheral neuropathy 04/17/2015   Hyperlipemia 06/02/2013   Hypertension    LEG PAIN, BILATERAL 06/04/2009   Qualifier: Diagnosis of  By: Domenic Polite, MD, FACC, [place], HX OF 06/04/2009   Qualifier: Diagnosis of  By: Domenic Polite, MD, Phillips Hay    Prostate CA [facility])    and basal cell nose    Past Surgical History:  Procedure Laterality Date   CARDIAC CATHETERIZATION  1997   EXCISION MASS NECK Left 11/12/2020   Procedure: EXCISION LEFT POSTERIOR NECK MASS;  Surgeon: Coralie Keens, MD;  Location: [place];  Service: General;  Laterality: Left;   EYE SURGERY Bilateral 2020   cataracts removed   [place] GASTRIC BANDING  [id number]   [facility] fundoplication   MENISCUS REPAIR Left 2012   ROBOT ASSISTED LAPAROSCOPIC RADICAL PROSTATECTOMY     ROTATOR CUFF REPAIR Right 2019   TOTAL KNEE ARTHROPLASTY Right 01/03/2021   Procedure: RIGHT  TOTAL KNEE ARTHROPLASTY;  Surgeon: Mcarthur Rossetti, MD;  [facility];  Service: Orthopedics;  Laterality: Right;  Needs RNFA   TOTAL KNEE ARTHROPLASTY Left 04/25/2021   Procedure: LEFT TOTAL KNEE ARTHROPLASTY;  Surgeon: Mcarthur Rossetti, MD;  [facility];  Service: Orthopedics;  Laterality: Left;    There were no vitals filed for this visit.   Subjective Assessment - 05/20/21 1125     Subjective COVID-19 screen performed prior to patient entering clinic.  Want to get this swelling down.Did good after last Rx. LT knee swelling more today    Pertinent History CTS, COPD, HTN, Hernia repair, left knee ATS, right RCR, right TKA.    How long can you walk comfortably? Around house.    Patient Stated Goals Return to an active lifestyle.    Currently in Pain? Yes    Pain Score 5     Pain Location Knee    Pain Orientation Left    Pain Descriptors / Indicators Aching;Sore    Pain Type Surgical pain    Pain Onset More than a month ago                               [facility] Adult PT Treatment/Exercise - 05/20/21 0001  Exercises   Exercises Knee/Hip      Knee/Hip Exercises: Aerobic   Recumbent Bike Level 1 x seat 10,9    x 20 mins   minutes.      Knee/Hip Exercises: Standing   Lateral Step Up Right;Hand Hold: 2;Step Height: 6";3 sets;10 reps    Forward Step Up Right;Step Height: 6";3 sets;10 reps    Rocker Board 3 minutes      Vasopneumatic   Number Minutes Vasopneumatic  15 minutes    Vasopnuematic Location  Knee    Vasopneumatic Pressure Medium    Vasopneumatic Temperature  34 for edema      Manual Therapy   Manual Therapy Passive ROM;Soft tissue mobilization    Manual therapy comments PROM  8-115 degrees    Soft tissue mobilization STW to posterior aspect/calf    Passive ROM In supine:  patella mobs, passive extension with active quad sets                         PT Long Term Goals - 04/29/21 1233       PT LONG  TERM GOAL #1   Title Independent with a HEP.    Time 6    Period Weeks    Status New      PT LONG TERM GOAL #3   Title Active left knee flexion to 120 to 125 degrees+ so the patient can perform functional tasks and do so with pain not > 2-3/10.    Time 6    Period Weeks    Status New      PT LONG TERM GOAL #4   Title Perform a reciprocating stair gait with one railing with pain not > 2-3/10.    Time 6    Period Weeks    Status New                   Plan - 05/20/21 1127     Clinical Impression Statement Pt arrived today doing fairly well, but still with c/o swelling and pain LT knee. Rx focused on ROM as well as strengthening for LT LE.  patella mobility and distal scar mobility are both improving. Normal modality response.    Personal Factors and Comorbidities Comorbidity 1;Comorbidity 2;Other    Comorbidities CTS, COPD, HTN, Hernia repair, left knee ATS, right RCR, right TKA.    Rehab Potential Excellent    PT Duration 6 weeks    PT Treatment/Interventions ADLs/Self Care Home Management;Cryotherapy;Electrical Stimulation;Gait training;Stair training;Functional mobility training;Therapeutic activities;Therapeutic exercise;Neuromuscular re-education;Manual techniques;Patient/family education;Passive range of motion;Vasopneumatic Device    PT Next Visit Plan Progress per TKA protocol.  Vasopneumatic.    Consulted and Agree with Plan of Care Patient             Patient will benefit from skilled therapeutic intervention in order to improve the following deficits and impairments:  Abnormal gait, Decreased activity tolerance, Decreased range of motion, Decreased strength, Increased edema, Pain  Visit Diagnosis: Chronic pain of left knee  Stiffness of left knee, not elsewhere classified  Localized edema     Problem List Patient Active Problem List   Diagnosis Date Noted   Status post total left knee replacement 04/25/2021   Real time reverse transcriptase PCR  positive for COVID-19 virus 03/21/2021   Upper respiratory infection with cough and congestion 03/19/2021   Status post total right knee replacement 01/03/2021   Left lower quadrant pain 09/25/2020   Diverticular disease of colon 09/25/2020  Colon cancer screening 09/25/2020   Statin intolerance 12/27/2018   BMI 28.0-28.9,adult 11/01/2018   H/O arthroscopy of shoulder 02/10/2018   Primary osteoarthritis of left knee 11/25/2016   Primary osteoarthritis of right knee 11/25/2016   Essential hypertension 06/28/2015   H/O prostate cancer 06/28/2015   Hereditary and idiopathic peripheral neuropathy 04/17/2015   Carpal tunnel syndrome 04/17/2015   Hyperlipemia 06/02/2013   Asthma, chronic 06/02/2013   Depression 06/02/2013   GERD 06/04/2009   LEG PAIN, BILATERAL 06/04/2009   PERCUTANEOUS TRANSLUMINAL CORONARY ANGIOPLASTY, HX OF 06/04/2009    Devion Chriscoe,CHRIS, PTA 05/20/2021, 12:11 PM  [facility] [address] [place], [place], [address] Phone: [phone]   Fax:  [phone]  Name: [name] [name] MRN: [id number] Date of Birth: November 21, 1945

## 2021-05-21 ENCOUNTER — Other Ambulatory Visit: Payer: Self-pay | Admitting: Nurse Practitioner

## 2021-05-21 DIAGNOSIS — I10 Essential (primary) hypertension: Secondary | ICD-10-CM

## 2021-05-22 ENCOUNTER — Other Ambulatory Visit: Payer: Self-pay

## 2021-05-22 ENCOUNTER — Ambulatory Visit: Payer: Medicare Other | Admitting: *Deleted

## 2021-05-22 DIAGNOSIS — M25662 Stiffness of left knee, not elsewhere classified: Secondary | ICD-10-CM | POA: Diagnosis not present

## 2021-05-22 DIAGNOSIS — M25562 Pain in left knee: Secondary | ICD-10-CM | POA: Diagnosis not present

## 2021-05-22 DIAGNOSIS — G8929 Other chronic pain: Secondary | ICD-10-CM

## 2021-05-22 DIAGNOSIS — R600 Localized edema: Secondary | ICD-10-CM | POA: Diagnosis not present

## 2021-05-22 NOTE — Therapy (Signed)
[facility] [place], [place], [address] Phone: [phone]   Fax:  [phone]  Physical Therapy Treatment  Patient Details  Name: [name] [name] MRN: [id number] Date of Birth: 11-06-1945 Referring Provider (PT): Jean Rosenthal MD   Encounter Date: 05/22/2021   PT End of Session - 05/22/21 1212     Visit Number 7    Number of Visits 12    Date for PT Re-Evaluation 07/28/21    Authorization Type FOTO AT LEAST EVERY 5TH VISIT.  PROGRESS NOTE AT 10TH VISIT.  KX MODIFIER AFTER 15 VISITS.    PT Start Time 1115    PT Stop Time 1214    PT Time Calculation (min) 59 min             Past Medical History:  Diagnosis Date   Arthritis    Asthma, chronic 06/02/2013   Carpal tunnel syndrome 04/17/2015   Bilateral   Depression 06/02/2013   DYSPNEA ON EXERTION 06/04/2009   Qualifier: Diagnosis of  By: Domenic Polite, MD, [place] Hay    Hereditary and idiopathic peripheral neuropathy 04/17/2015   Hyperlipemia 06/02/2013   Hypertension    LEG PAIN, BILATERAL 06/04/2009   Qualifier: Diagnosis of  By: Domenic Polite, MD, FACC, [place], HX OF 06/04/2009   Qualifier: Diagnosis of  By: Domenic Polite, MD, Phillips Hay    Prostate CA [facility])    and basal cell nose    Past Surgical History:  Procedure Laterality Date   CARDIAC CATHETERIZATION  1997   EXCISION MASS NECK Left 11/12/2020   Procedure: EXCISION LEFT POSTERIOR NECK MASS;  Surgeon: Coralie Keens, MD;  Location: [facility];  Service: General;  Laterality: Left;   EYE SURGERY Bilateral 2020   cataracts removed   [place] GASTRIC BANDING  [id number]   [facility] fundoplication   MENISCUS REPAIR Left 2012   ROBOT ASSISTED LAPAROSCOPIC RADICAL PROSTATECTOMY     ROTATOR CUFF REPAIR Right 2019   TOTAL KNEE ARTHROPLASTY Right 01/03/2021   Procedure: RIGHT  TOTAL KNEE ARTHROPLASTY;  Surgeon: Mcarthur Rossetti, MD;  [facility];  Service: Orthopedics;  Laterality: Right;  Needs RNFA   TOTAL KNEE ARTHROPLASTY Left 04/25/2021   Procedure: LEFT TOTAL KNEE ARTHROPLASTY;  Surgeon: Mcarthur Rossetti, MD;  [facility];  Service: Orthopedics;  Laterality: Left;    There were no vitals filed for this visit.   Subjective Assessment - 05/22/21 1128     Subjective COVID-19 screen performed prior to patient entering clinic.  Want to get this swelling down.Did good after last Rx. Pain at night 5/10. Disturbed    Pertinent History CTS, COPD, HTN, Hernia repair, left knee ATS, right RCR, right TKA.    How long can you walk comfortably? Around house.    Patient Stated Goals Return to an active lifestyle.    Currently in Pain? Yes    Pain Score 5     Pain Location Knee    Pain Orientation Left    Pain Descriptors / Indicators Aching;Sore    Pain Onset More than a month ago                               [facility] Adult PT Treatment/Exercise - 05/22/21 0001       Exercises   Exercises Knee/Hip  Knee/Hip Exercises: Aerobic   Recumbent Bike Level 1 x seat 10,9    x 15 mins   minutes.      Knee/Hip Exercises: Standing   Lateral Step Up Right;Hand Hold: 2;Step Height: 6";3 sets;10 reps    Forward Step Up Right;Step Height: 6";3 sets;10 reps    Rocker Board 3 minutes;5 minutes      Knee/Hip Exercises: Seated   Long Arc Quad Left;3 sets;10 reps   5 sec holds   Long Arc Quad Weight 5 lbs.      Vasopneumatic   Number Minutes Vasopneumatic  10 minutes    Vasopnuematic Location  Knee    Vasopneumatic Pressure Medium    Vasopneumatic Temperature  34 for edema      Manual Therapy   Manual Therapy Passive ROM;Soft tissue mobilization    Passive ROM In supine:  patella mobs, passive extension with active quad sets                          PT Long Term Goals - 04/29/21 1233       PT LONG TERM  GOAL #1   Title Independent with a HEP.    Time 6    Period Weeks    Status New      PT LONG TERM GOAL #3   Title Active left knee flexion to 120 to 125 degrees+ so the patient can perform functional tasks and do so with pain not > 2-3/10.    Time 6    Period Weeks    Status New      PT LONG TERM GOAL #4   Title Perform a reciprocating stair gait with one railing with pain not > 2-3/10.    Time 6    Period Weeks    Status New                   Plan - 05/22/21 1317     Clinical Impression Statement Pt arrived today doing fairly well with LT knee and his cc today is disturbed sleep due to pain. Rx focused again on ROM as well as strengthening OKC/ CKC. Pt tolerated 5# LAQs well today with good control. Normal Vaso end of session. SPC for ambulation now. 116 degrees flexion today    Personal Factors and Comorbidities Comorbidity 1;Comorbidity 2;Other    Comorbidities CTS, COPD, HTN, Hernia repair, left knee ATS, right RCR, right TKA.    Examination-Participation Restrictions Other    Stability/Clinical Decision Making Stable/Uncomplicated    Rehab Potential Excellent    PT Duration 6 weeks    PT Treatment/Interventions ADLs/Self Care Home Management;Cryotherapy;Electrical Stimulation;Gait training;Stair training;Functional mobility training;Therapeutic activities;Therapeutic exercise;Neuromuscular re-education;Manual techniques;Patient/family education;Passive range of motion;Vasopneumatic Device    PT Next Visit Plan Progress per TKA protocol.  Vasopneumatic.    Consulted and Agree with Plan of Care Patient             Patient will benefit from skilled therapeutic intervention in order to improve the following deficits and impairments:  Abnormal gait, Decreased activity tolerance, Decreased range of motion, Decreased strength, Increased edema, Pain  Visit Diagnosis: Chronic pain of left knee  Stiffness of left knee, not elsewhere classified     Problem  List Patient Active Problem List   Diagnosis Date Noted   Status post total left knee replacement 04/25/2021   Real time reverse transcriptase PCR positive for COVID-19 virus 03/21/2021   Upper respiratory infection with cough and congestion  03/19/2021   Status post total right knee replacement 01/03/2021   Left lower quadrant pain 09/25/2020   Diverticular disease of colon 09/25/2020   Colon cancer screening 09/25/2020   Statin intolerance 12/27/2018   BMI 28.0-28.9,adult 11/01/2018   H/O arthroscopy of shoulder 02/10/2018   Primary osteoarthritis of left knee 11/25/2016   Primary osteoarthritis of right knee 11/25/2016   Essential hypertension 06/28/2015   H/O prostate cancer 06/28/2015   Hereditary and idiopathic peripheral neuropathy 04/17/2015   Carpal tunnel syndrome 04/17/2015   Hyperlipemia 06/02/2013   Asthma, chronic 06/02/2013   Depression 06/02/2013   GERD 06/04/2009   LEG PAIN, BILATERAL 06/04/2009   PERCUTANEOUS TRANSLUMINAL CORONARY ANGIOPLASTY, HX OF 06/04/2009    Gracin Mcpartland,CHRIS, PTA 05/22/2021, 1:27 PM  [facility] [address] [place], [place], [address] Phone: [phone]   Fax:  [phone]  Name: JAWUN DUDZIK MRN: [id number] Date of Birth: Jan 30, 1946

## 2021-05-22 NOTE — Therapy (Signed)
[facility] [place], [place], [address] Phone: [phone]   Fax:  [phone]  Physical Therapy Treatment  Patient Details  Name: [name] [name] MRN: [id number] Date of Birth: July 14, 1946 Referring Provider (PT): Jean Rosenthal MD   Encounter Date: 05/22/2021   PT End of Session - 05/22/21 1212     Visit Number 7    Number of Visits 12    Date for PT Re-Evaluation 07/28/21    Authorization Type FOTO AT LEAST EVERY 5TH VISIT.  PROGRESS NOTE AT 10TH VISIT.  KX MODIFIER AFTER 15 VISITS.    PT Start Time 1115    PT Stop Time 1214    PT Time Calculation (min) 59 min             Past Medical History:  Diagnosis Date   Arthritis    Asthma, chronic 06/02/2013   Carpal tunnel syndrome 04/17/2015   Bilateral   Depression 06/02/2013   DYSPNEA ON EXERTION 06/04/2009   Qualifier: Diagnosis of  By: Domenic Polite, MD, [place] Hay    Hereditary and idiopathic peripheral neuropathy 04/17/2015   Hyperlipemia 06/02/2013   Hypertension    LEG PAIN, BILATERAL 06/04/2009   Qualifier: Diagnosis of  By: Domenic Polite, MD, FACC, [place], HX OF 06/04/2009   Qualifier: Diagnosis of  By: Domenic Polite, MD, Phillips Hay    Prostate CA [facility])    and basal cell nose    Past Surgical History:  Procedure Laterality Date   CARDIAC CATHETERIZATION  1997   EXCISION MASS NECK Left 11/12/2020   Procedure: EXCISION LEFT POSTERIOR NECK MASS;  Surgeon: Coralie Keens, MD;  Location: [facility];  Service: General;  Laterality: Left;   EYE SURGERY Bilateral 2020   cataracts removed   [place] GASTRIC BANDING  [id number]   [facility] fundoplication   MENISCUS REPAIR Left 2012   ROBOT ASSISTED LAPAROSCOPIC RADICAL PROSTATECTOMY     ROTATOR CUFF REPAIR Right 2019   TOTAL KNEE ARTHROPLASTY Right 01/03/2021   Procedure: RIGHT  TOTAL KNEE ARTHROPLASTY;  Surgeon: Mcarthur Rossetti, MD;  [facility];  Service: Orthopedics;  Laterality: Right;  Needs RNFA   TOTAL KNEE ARTHROPLASTY Left 04/25/2021   Procedure: LEFT TOTAL KNEE ARTHROPLASTY;  Surgeon: Mcarthur Rossetti, MD;  [facility];  Service: Orthopedics;  Laterality: Left;    There were no vitals filed for this visit.   Subjective Assessment - 05/22/21 1128     Subjective COVID-19 screen performed prior to patient entering clinic.  Want to get this swelling down.Did good after last Rx. Pain at night 5/10. Disturbed    Pertinent History CTS, COPD, HTN, Hernia repair, left knee ATS, right RCR, right TKA.    How long can you walk comfortably? Around house.    Patient Stated Goals Return to an active lifestyle.    Currently in Pain? Yes    Pain Score 5     Pain Location Knee    Pain Orientation Left    Pain Descriptors / Indicators Aching;Sore    Pain Onset More than a month ago                               [facility] Adult PT Treatment/Exercise - 05/22/21 0001       Exercises   Exercises Knee/Hip  Knee/Hip Exercises: Aerobic   Recumbent Bike Level 1 x seat 10,9    x 15 mins   minutes.      Knee/Hip Exercises: Standing   Lateral Step Up Right;Hand Hold: 2;Step Height: 6";3 sets;10 reps    Forward Step Up Right;Step Height: 6";3 sets;10 reps    Rocker Board 3 minutes;5 minutes      Vasopneumatic   Number Minutes Vasopneumatic  10 minutes    Vasopnuematic Location  Knee    Vasopneumatic Pressure Medium    Vasopneumatic Temperature  34 for edema      Manual Therapy   Manual Therapy Passive ROM;Soft tissue mobilization    Passive ROM In supine:  patella mobs, passive extension with active quad sets                          PT Long Term Goals - 04/29/21 1233       PT LONG TERM GOAL #1   Title Independent with a HEP.    Time 6    Period Weeks    Status New      PT LONG TERM GOAL #3    Title Active left knee flexion to 120 to 125 degrees+ so the patient can perform functional tasks and do so with pain not > 2-3/10.    Time 6    Period Weeks    Status New      PT LONG TERM GOAL #4   Title Perform a reciprocating stair gait with one railing with pain not > 2-3/10.    Time 6    Period Weeks    Status New                   Plan - 05/22/21 1317     Clinical Impression Statement Pt arrived today doing fairly well with LT knee and his cc today is disturbed sleep due to pain. Rx focused again on ROM as well as strengthening OKC/ CKC. Pt tolerated 5# LAQs well today with good control. Normal Vaso end of session. SPC for ambulation now. 116 degrees flexion today    Personal Factors and Comorbidities Comorbidity 1;Comorbidity 2;Other    Comorbidities CTS, COPD, HTN, Hernia repair, left knee ATS, right RCR, right TKA.    Examination-Participation Restrictions Other    Stability/Clinical Decision Making Stable/Uncomplicated    Rehab Potential Excellent    PT Duration 6 weeks    PT Treatment/Interventions ADLs/Self Care Home Management;Cryotherapy;Electrical Stimulation;Gait training;Stair training;Functional mobility training;Therapeutic activities;Therapeutic exercise;Neuromuscular re-education;Manual techniques;Patient/family education;Passive range of motion;Vasopneumatic Device    PT Next Visit Plan Progress per TKA protocol.  Vasopneumatic.    Consulted and Agree with Plan of Care Patient             Patient will benefit from skilled therapeutic intervention in order to improve the following deficits and impairments:  Abnormal gait, Decreased activity tolerance, Decreased range of motion, Decreased strength, Increased edema, Pain  Visit Diagnosis: Chronic pain of left knee  Stiffness of left knee, not elsewhere classified     Problem List Patient Active Problem List   Diagnosis Date Noted   Status post total left knee replacement 04/25/2021   Real  time reverse transcriptase PCR positive for COVID-19 virus 03/21/2021   Upper respiratory infection with cough and congestion 03/19/2021   Status post total right knee replacement 01/03/2021   Left lower quadrant pain 09/25/2020   Diverticular disease of colon 09/25/2020   Colon cancer screening  09/25/2020   Statin intolerance 12/27/2018   BMI 28.0-28.9,adult 11/01/2018   H/O arthroscopy of shoulder 02/10/2018   Primary osteoarthritis of left knee 11/25/2016   Primary osteoarthritis of right knee 11/25/2016   Essential hypertension 06/28/2015   H/O prostate cancer 06/28/2015   Hereditary and idiopathic peripheral neuropathy 04/17/2015   Carpal tunnel syndrome 04/17/2015   Hyperlipemia 06/02/2013   Asthma, chronic 06/02/2013   Depression 06/02/2013   GERD 06/04/2009   LEG PAIN, BILATERAL 06/04/2009   PERCUTANEOUS TRANSLUMINAL CORONARY ANGIOPLASTY, HX OF 06/04/2009    Brixton Schnapp,CHRIS, PTA 05/22/2021, 1:22 PM  [facility] [address] [place], [place], [address] Phone: [phone]   Fax:  [phone]  Name: [name] [name] MRN: [id number] Date of Birth: 1946/02/20

## 2021-05-27 ENCOUNTER — Ambulatory Visit (INDEPENDENT_AMBULATORY_CARE_PROVIDER_SITE_OTHER): Payer: Medicare Other | Admitting: Nurse Practitioner

## 2021-05-27 ENCOUNTER — Encounter: Payer: Self-pay | Admitting: Nurse Practitioner

## 2021-05-27 ENCOUNTER — Ambulatory Visit: Payer: Medicare Other | Admitting: Physical Therapy

## 2021-05-27 ENCOUNTER — Encounter: Payer: Self-pay | Admitting: Physical Therapy

## 2021-05-27 ENCOUNTER — Other Ambulatory Visit: Payer: Self-pay

## 2021-05-27 VITALS — BP 132/71 | HR 70 | Temp 98.0°F | Resp 20 | Ht 74.0 in | Wt 252.0 lb

## 2021-05-27 DIAGNOSIS — R739 Hyperglycemia, unspecified: Secondary | ICD-10-CM | POA: Diagnosis not present

## 2021-05-27 DIAGNOSIS — G8929 Other chronic pain: Secondary | ICD-10-CM

## 2021-05-27 DIAGNOSIS — M25662 Stiffness of left knee, not elsewhere classified: Secondary | ICD-10-CM

## 2021-05-27 DIAGNOSIS — Z08 Encounter for follow-up examination after completed treatment for malignant neoplasm: Secondary | ICD-10-CM | POA: Diagnosis not present

## 2021-05-27 DIAGNOSIS — F3342 Major depressive disorder, recurrent, in full remission: Secondary | ICD-10-CM

## 2021-05-27 DIAGNOSIS — Z8546 Personal history of malignant neoplasm of prostate: Secondary | ICD-10-CM

## 2021-05-27 DIAGNOSIS — M25562 Pain in left knee: Secondary | ICD-10-CM

## 2021-05-27 DIAGNOSIS — J4520 Mild intermittent asthma, uncomplicated: Secondary | ICD-10-CM

## 2021-05-27 DIAGNOSIS — G609 Hereditary and idiopathic neuropathy, unspecified: Secondary | ICD-10-CM

## 2021-05-27 DIAGNOSIS — R600 Localized edema: Secondary | ICD-10-CM

## 2021-05-27 DIAGNOSIS — Z6828 Body mass index (BMI) 28.0-28.9, adult: Secondary | ICD-10-CM

## 2021-05-27 DIAGNOSIS — E782 Mixed hyperlipidemia: Secondary | ICD-10-CM | POA: Diagnosis not present

## 2021-05-27 DIAGNOSIS — K5730 Diverticulosis of large intestine without perforation or abscess without bleeding: Secondary | ICD-10-CM | POA: Diagnosis not present

## 2021-05-27 DIAGNOSIS — I10 Essential (primary) hypertension: Secondary | ICD-10-CM

## 2021-05-27 DIAGNOSIS — K219 Gastro-esophageal reflux disease without esophagitis: Secondary | ICD-10-CM

## 2021-05-27 MED ORDER — CITALOPRAM HYDROBROMIDE 20 MG PO TABS: 20.0000 mg | ORAL_TABLET | Freq: Every day | ORAL | 0 refills | Status: DC

## 2021-05-27 MED ORDER — PRAVASTATIN SODIUM 20 MG PO TABS: 10.0000 mg | ORAL_TABLET | Freq: Every day | ORAL | 1 refills | Status: DC

## 2021-05-27 MED ORDER — BUDESONIDE-FORMOTEROL FUMARATE 160-4.5 MCG/ACT IN AERO: 2.0000 | INHALATION_SPRAY | Freq: Two times a day (BID) | RESPIRATORY_TRACT | 3 refills | Status: DC

## 2021-05-27 MED ORDER — MONTELUKAST SODIUM 10 MG PO TABS: 10.0000 mg | ORAL_TABLET | Freq: Every day | ORAL | 1 refills | Status: DC

## 2021-05-27 MED ORDER — ATENOLOL 25 MG PO TABS: 25.0000 mg | ORAL_TABLET | Freq: Every day | ORAL | 0 refills | Status: DC

## 2021-05-27 MED ORDER — AMLODIPINE BESYLATE 5 MG PO TABS: 5.0000 mg | ORAL_TABLET | Freq: Every day | ORAL | 1 refills | Status: DC

## 2021-05-27 MED ORDER — EZETIMIBE 10 MG PO TABS: 10.0000 mg | ORAL_TABLET | Freq: Every day | ORAL | 1 refills | Status: DC

## 2021-05-27 NOTE — Progress Notes (Signed)
Subjective:    Patient ID: [name] [name], male    DOB: 1946/01/27, 75 y.o.   MRN: [id number]   Chief Complaint: medical management of chronic issues     HPI:  1. Essential hypertension No c/o chest pain, sob or headache. Does not check blood pressure at home. BP Readings from Last 3 Encounters:  05/27/21 132/71  05/15/21 (!) 155/74  04/26/21 (!) 123/55      2. Mixed hyperlipidemia Doe snot watch diet and is currently doing some exercise for status post Total Knee replacements. Is able to tolerate pravachol and zetia combination. Lab Results  Component Value Date   CHOL 181 12/31/2020   HDL 42 12/31/2020   LDLCALC 96 12/31/2020   LDLDIRECT 92.8 08/28/2014   TRIG 256 (H) 12/31/2020   CHOLHDL 4.3 12/31/2020     3. Diverticular disease of colon Denies any recent flare ups  4. Gastroesophageal reflux disease, unspecified whether esophagitis present Is on no prescription meds  5. Hereditary and idiopathic peripheral neuropathy Numbness and tingling in bil feet  6. Recurrent major depressive disorder, in full remission ([place]) Is on celexa daily and is doing well.  7. chronic asthma Is on symbicort daily and takes a daily singulair. He has not needed his albuterol recently  8. BMI 28.0-28.9,adult Wt Readings from Last 3 Encounters:  05/27/21 252 lb (114.3 kg)  04/25/21 253 lb 8.5 oz (115 kg)  04/18/21 254 lb (115.2 kg)   BMI Readings from Last 3 Encounters:  05/27/21 32.35 kg/m  04/25/21 32.03 kg/m  04/18/21 32.09 kg/m        Outpatient Encounter Medications as of 05/27/2021  Medication Sig   amLODipine (NORVASC) 5 MG tablet TAKE 1 TABLET DAILY   aspirin 81 MG chewable tablet Chew 1 tablet (81 mg total) by mouth 2 (two) times daily.   atenolol (TENORMIN) 25 MG tablet TAKE 1 TABLET DAILY (Patient taking differently: Take 25 mg by mouth daily.)   budesonide-formoterol (SYMBICORT) 160-4.5 MCG/ACT inhaler Inhale 2 puffs into the lungs 2 (two) times daily.  (Patient taking differently: Inhale 2 puffs into the lungs 2 (two) times daily as needed (shortness of breath).)   Cholecalciferol (VITAMIN D3) 25 MCG (1000 UT) CAPS Take 1,000 Units by mouth daily.   citalopram (CELEXA) 20 MG tablet Take 1 tablet (20 mg total) by mouth daily. (NEEDS TO BE SEEN BEFORE NEXT REFILL)   cyanocobalamin (,VITAMIN B-12,) 1000 MCG/ML injection Inject 1 mL (1,000 mcg total) into the muscle every 30 (thirty) days.   dextromethorphan-guaiFENesin (MUCINEX DM) 30-600 MG 12hr tablet Take 1 tablet by mouth 2 (two) times daily. (Patient taking differently: Take 1 tablet by mouth daily as needed for cough.)   diclofenac Sodium (VOLTAREN) 1 % GEL Apply 1 application topically daily as needed (pain).   ezetimibe (ZETIA) 10 MG tablet Take 10 mg by mouth daily.   loperamide (IMODIUM A-D) 2 MG tablet Take 2 mg by mouth daily.   loratadine (CLARITIN) 10 MG tablet Take 10 mg by mouth daily as needed for allergies.   methocarbamol (ROBAXIN) 500 MG tablet Take 1 tablet (500 mg total) by mouth every 6 (six) hours as needed for muscle spasms.   montelukast (SINGULAIR) 10 MG tablet TAKE 1 TABLET DAILY   naproxen (NAPROSYN) 500 MG tablet Take 500 mg by mouth daily.   oxyCODONE (OXY IR/ROXICODONE) 5 MG immediate release tablet Take 1-2 tablets (5-10 mg total) by mouth every 4 (four) hours as needed for moderate pain (pain score 4-6).  pravastatin (PRAVACHOL) 20 MG tablet Take 10 mg by mouth daily.   Facility-Administered Encounter Medications as of 05/27/2021  Medication   betamethasone acetate-betamethasone sodium phosphate (CELESTONE) injection 12 mg    Past Surgical History:  Procedure Laterality Date   CARDIAC CATHETERIZATION  1997   EXCISION MASS NECK Left 11/12/2020   Procedure: EXCISION LEFT POSTERIOR NECK MASS;  Surgeon: Coralie Keens, MD;  Location: [facility];  Service: General;  Laterality: Left;   EYE SURGERY Bilateral 2020   cataracts removed   HERNIA  REPAIR     HIATAL HERNIA REPAIR     LAPAROSCOPIC GASTRIC BANDING  3354   [facility] fundoplication   MENISCUS REPAIR Left 2012   ROBOT ASSISTED LAPAROSCOPIC RADICAL PROSTATECTOMY     ROTATOR CUFF REPAIR Right 2019   TOTAL KNEE ARTHROPLASTY Right 01/03/2021   Procedure: RIGHT TOTAL KNEE ARTHROPLASTY;  Surgeon: Mcarthur Rossetti, MD;  [facility];  Service: Orthopedics;  Laterality: Right;  Needs RNFA   TOTAL KNEE ARTHROPLASTY Left 04/25/2021   Procedure: LEFT TOTAL KNEE ARTHROPLASTY;  Surgeon: Mcarthur Rossetti, MD;  [facility];  Service: Orthopedics;  Laterality: Left;    Family History  Problem Relation Age of Onset   Heart disease Other        Early Cardiac disease for uncle is their 58's as well as deceased.   Heart failure Mother    Dementia Mother 1   Cancer Father 69       testicular, lung   Esophageal cancer Brother     New complaints: None today  Social history: Lives with wife. He just had his second total knee replacement  Controlled substance contract: n/a     Review of Systems  Constitutional:  Negative for diaphoresis.  Eyes:  Negative for pain.  Respiratory:  Negative for shortness of breath.   Cardiovascular:  Negative for chest pain, palpitations and leg swelling.  Gastrointestinal:  Negative for abdominal pain.  Endocrine: Negative for polydipsia.  Skin:  Negative for rash.  Neurological:  Negative for dizziness, weakness and headaches.  Hematological:  Does not bruise/bleed easily.  All other systems reviewed and are negative.     Objective:   Physical Exam Vitals and nursing note reviewed.  Constitutional:      Appearance: Normal appearance. He is well-developed.  HENT:     Head: Normocephalic.     Nose: Nose normal.  Eyes:     Pupils: Pupils are equal, round, and reactive to light.  Neck:     Thyroid: No thyroid mass or thyromegaly.     Vascular: No carotid bruit or JVD.     Trachea: Phonation normal.  Cardiovascular:      Rate and Rhythm: Normal rate and regular rhythm.  Pulmonary:     Effort: Pulmonary effort is normal. No respiratory distress.     Breath sounds: Normal breath sounds.  Abdominal:     General: Bowel sounds are normal.     Palpations: Abdomen is soft.     Tenderness: There is no abdominal tenderness.  Musculoskeletal:        General: Normal range of motion.     Cervical back: Normal range of motion and neck supple.  Lymphadenopathy:     Cervical: No cervical adenopathy.  Skin:    General: Skin is warm and dry.  Neurological:     Mental Status: He is alert and oriented to person, place, and time.  Psychiatric:        Behavior:  Behavior normal.        Thought Content: Thought content normal.        Judgment: Judgment normal.    BP 132/71   Pulse 70   Temp 98 F (36.7 C) (Temporal)   Resp 20   Ht _0  (1.88 m)   Wt 252 lb (114.3 kg)   SpO2 97%   BMI 32.35 kg/m        Assessment & Plan:   DAMOND BORCHERS comes in today with chief complaint of Medical Management of Chronic Issues   Diagnosis and orders addressed:  1. Essential hypertension Low sodium diet - amLODipine (NORVASC) 5 MG tablet; Take 1 tablet (5 mg total) by mouth daily.  Dispense: 90 tablet; Refill: 1 - atenolol (TENORMIN) 25 MG tablet; Take 1 tablet (25 mg total) by mouth daily.  Dispense: 90 tablet; Refill: 0 - budesonide-formoterol (SYMBICORT) 160-4.5 MCG/ACT inhaler; Inhale 2 puffs into the lungs 2 (two) times daily.  Dispense: 3 each; Refill: 3 - CBC with Differential/Platelet - CMP14+EGFR  2. Mixed hyperlipidemia Low fat diet - ezetimibe (ZETIA) 10 MG tablet; Take 1 tablet (10 mg total) by mouth daily.  Dispense: 90 tablet; Refill: 1 - pravastatin (PRAVACHOL) 20 MG tablet; Take 0.5 tablets (10 mg total) by mouth daily.  Dispense: 90 tablet; Refill: 1 - Lipid panel  3. Diverticular disease of colon Watch diet to prevent flare up  4. Gastroesophageal reflux disease, unspecified whether  esophagitis present Avoid spicy foods Do not eat 2 hours prior to bedtime  5. Hereditary and idiopathic peripheral neuropathy Do not g barefooted  6. Recurrent major depressive disorder, in full remission ([place]) Stress management - citalopram (CELEXA) 20 MG tablet; Take 1 tablet (20 mg total) by mouth daily. (NEEDS TO BE SEEN BEFORE NEXT REFILL)  Dispense: 30 tablet; Refill: 0  7. BMI 28.0-28.9,adult Discussed diet and exercise for person with BMI >25 Will recheck weight in 3-6 months  8. Mild intermittent chronic asthma without complication - montelukast (SINGULAIR) 10 MG tablet; Take 1 tablet (10 mg total) by mouth daily.  Dispense: 90 tablet; Refill: 1  9. Encounter for follow-up surveillance of prostate cancer Labs pending - PSA, total and free   Labs pending Health Maintenance reviewed Diet and exercise encouraged  Follow up plan: 6 months   Mary-Margaret Hassell Done, FNP

## 2021-05-27 NOTE — Therapy (Signed)
[facility] [place], [place], [address] Phone: [phone]   Fax:  [phone]  Physical Therapy Treatment  Patient Details  Name: [name] [name] MRN: [id number] Date of Birth: 1946-02-03 Referring Provider (PT): Jean Rosenthal MD   Encounter Date: 05/27/2021   PT End of Session - 05/27/21 1146     Visit Number 8    Number of Visits 12    Date for PT Re-Evaluation 07/28/21    Authorization Type FOTO AT LEAST EVERY 5TH VISIT.  PROGRESS NOTE AT 10TH VISIT.  KX MODIFIER AFTER 15 VISITS.    PT Start Time 1115    PT Stop Time 1208    PT Time Calculation (min) 53 min    Activity Tolerance Patient tolerated treatment well    Behavior During Therapy [facility] for tasks assessed/performed             Past Medical History:  Diagnosis Date   Arthritis    Asthma, chronic 06/02/2013   Carpal tunnel syndrome 04/17/2015   Bilateral   Depression 06/02/2013   DYSPNEA ON EXERTION 06/04/2009   Qualifier: Diagnosis of  By: Domenic Polite, MD, Phillips Hay    Hereditary and idiopathic peripheral neuropathy 04/17/2015   Hyperlipemia 06/02/2013   Hypertension    LEG PAIN, BILATERAL 06/04/2009   Qualifier: Diagnosis of  By: Domenic Polite, MD, FACC, [place], HX OF 06/04/2009   Qualifier: Diagnosis of  By: Domenic Polite, MD, Phillips Hay    Prostate CA [facility])    and basal cell nose    Past Surgical History:  Procedure Laterality Date   CARDIAC CATHETERIZATION  1997   EXCISION MASS NECK Left 11/12/2020   Procedure: EXCISION LEFT POSTERIOR NECK MASS;  Surgeon: Coralie Keens, MD;  Location: [place];  Service: General;  Laterality: Left;   EYE SURGERY Bilateral 2020   cataracts removed   [place] GASTRIC BANDING  [id number]   [facility] fundoplication   MENISCUS REPAIR Left 2012   ROBOT ASSISTED LAPAROSCOPIC  RADICAL PROSTATECTOMY     ROTATOR CUFF REPAIR Right 2019   TOTAL KNEE ARTHROPLASTY Right 01/03/2021   Procedure: RIGHT TOTAL KNEE ARTHROPLASTY;  Surgeon: Mcarthur Rossetti, MD;  Location[facility];  Service: Orthopedics;  Laterality: Right;  Needs RNFA   TOTAL KNEE ARTHROPLASTY Left 04/25/2021   Procedure: LEFT TOTAL KNEE ARTHROPLASTY;  Surgeon: Mcarthur Rossetti, MD;  Location: [facility];  Service: Orthopedics;  Laterality: Left;    There were no vitals filed for this visit.   Subjective Assessment - 05/27/21 1133     Subjective COVID-19 screen performed prior to patient entering clinic.  Doing great.    Pertinent History CTS, COPD, HTN, Hernia repair, left knee ATS, right RCR, right TKA.    Currently in Pain? Yes    Pain Score 3     Pain Location Knee    Pain Orientation Left    Pain Descriptors / Indicators Aching;Sore    Pain Onset More than a month ago                               [facility] Adult PT Treatment/Exercise - 05/27/21 0001       Exercises   Exercises Knee/Hip      Knee/Hip Exercises: Aerobic   Recumbent Bike Level 2 x 15 minutes.  Knee/Hip Exercises: Machines for Strengthening   Cybex Knee Extension 20# x 3 minutes.    Cybex Knee Flexion 40# x 3 minutes.    Cybex Leg Press 3 plates x 3 minutes.      Vasopneumatic   Number Minutes Vasopneumatic  20 minutes    Vasopnuematic Location  --   Left knee.   Vasopneumatic Pressure Medium                          PT Long Term Goals - 04/29/21 1233       PT LONG TERM GOAL #1   Title Independent with a HEP.    Time 6    Period Weeks    Status New      PT LONG TERM GOAL #3   Title Active left knee flexion to 120 to 125 degrees+ so the patient can perform functional tasks and do so with pain not > 2-3/10.    Time 6    Period Weeks    Status New      PT LONG TERM GOAL #4   Title Perform a reciprocating stair gait with one railing with pain not > 2-3/10.    Time 6     Period Weeks    Status New                   Plan - 05/27/21 1148     Clinical Impression Statement The patient did great today.  He is highly motivated and performed resisted machines with excellent technique and no complaint.    Personal Factors and Comorbidities Comorbidity 1;Comorbidity 2;Other    Comorbidities CTS, COPD, HTN, Hernia repair, left knee ATS, right RCR, right TKA.    Examination-Activity Limitations Other;Locomotion Level    Examination-Participation Restrictions Other    Stability/Clinical Decision Making Stable/Uncomplicated    Rehab Potential Excellent    PT Duration 6 weeks    PT Treatment/Interventions ADLs/Self Care Home Management;Cryotherapy;Electrical Stimulation;Gait training;Stair training;Functional mobility training;Therapeutic activities;Therapeutic exercise;Neuromuscular re-education;Manual techniques;Patient/family education;Passive range of motion;Vasopneumatic Device    PT Next Visit Plan Continue with resisted weight machines.             Patient will benefit from skilled therapeutic intervention in order to improve the following deficits and impairments:  Abnormal gait, Decreased activity tolerance, Decreased range of motion, Decreased strength, Increased edema, Pain  Visit Diagnosis: Chronic pain of left knee  Localized edema  Stiffness of left knee, not elsewhere classified     Problem List Patient Active Problem List   Diagnosis Date Noted   Status post total left knee replacement 04/25/2021   Real time reverse transcriptase PCR positive for COVID-19 virus 03/21/2021   Upper respiratory infection with cough and congestion 03/19/2021   Status post total right knee replacement 01/03/2021   Left lower quadrant pain 09/25/2020   Diverticular disease of colon 09/25/2020   Colon cancer screening 09/25/2020   Statin intolerance 12/27/2018   BMI 28.0-28.9,adult 11/01/2018   H/O arthroscopy of shoulder 02/10/2018   Primary  osteoarthritis of left knee 11/25/2016   Primary osteoarthritis of right knee 11/25/2016   Essential hypertension 06/28/2015   H/O prostate cancer 06/28/2015   Hereditary and idiopathic peripheral neuropathy 04/17/2015   Carpal tunnel syndrome 04/17/2015   Hyperlipemia 06/02/2013   Asthma, chronic 06/02/2013   Depression 06/02/2013   GERD 06/04/2009   LEG PAIN, BILATERAL 06/04/2009   PERCUTANEOUS TRANSLUMINAL CORONARY ANGIOPLASTY, HX OF 06/04/2009    Melaina Howerton, [place],  PT 05/27/2021, 12:11 PM  [facility] [place], [place], [address] Phone: [phone]   Fax:  [phone]  Name: [name] [name] MRN: [id number] Date of Birth: 1946-08-09

## 2021-05-27 NOTE — Patient Instructions (Signed)

## 2021-05-28 LAB — CBC WITH DIFFERENTIAL/PLATELET
Basophils Absolute: 0.1 10*3/uL (ref 0.0–0.2)
Basos: 1 %
EOS (ABSOLUTE): 0.3 10*3/uL (ref 0.0–0.4)
Eos: 3 %
Hematocrit: 39.2 % (ref 37.5–51.0)
Hemoglobin: 12.9 g/dL — ABNORMAL LOW (ref 13.0–17.7)
Immature Grans (Abs): 0 10*3/uL (ref 0.0–0.1)
Immature Granulocytes: 0 %
Lymphocytes Absolute: 3.4 10*3/uL — ABNORMAL HIGH (ref 0.7–3.1)
Lymphs: 34 %
MCH: 30.5 pg (ref 26.6–33.0)
MCHC: 32.9 g/dL (ref 31.5–35.7)
MCV: 93 fL (ref 79–97)
Monocytes Absolute: 0.6 10*3/uL (ref 0.1–0.9)
Monocytes: 6 %
Neutrophils Absolute: 5.8 10*3/uL (ref 1.4–7.0)
Neutrophils: 56 %
Platelets: 276 10*3/uL (ref 150–450)
RBC: 4.23 x10E6/uL (ref 4.14–5.80)
RDW: 13.1 % (ref 11.6–15.4)
WBC: 10.2 10*3/uL (ref 3.4–10.8)

## 2021-05-28 LAB — CMP14+EGFR
ALT: 13 IU/L (ref 0–44)
AST: 14 IU/L (ref 0–40)
Albumin/Globulin Ratio: 1.5 (ref 1.2–2.2)
Albumin: 4.3 g/dL (ref 3.7–4.7)
Alkaline Phosphatase: 138 IU/L — ABNORMAL HIGH (ref 44–121)
BUN/Creatinine Ratio: 11 (ref 10–24)
BUN: 14 mg/dL (ref 8–27)
Bilirubin Total: 0.3 mg/dL (ref 0.0–1.2)
CO2: 21 mmol/L (ref 20–29)
Calcium: 9.5 mg/dL (ref 8.6–10.2)
Chloride: 101 mmol/L (ref 96–106)
Creatinine, Ser: 1.24 mg/dL (ref 0.76–1.27)
Globulin, Total: 2.8 g/dL (ref 1.5–4.5)
Glucose: 175 mg/dL — ABNORMAL HIGH (ref 65–99)
Potassium: 4.6 mmol/L (ref 3.5–5.2)
Sodium: 139 mmol/L (ref 134–144)
Total Protein: 7.1 g/dL (ref 6.0–8.5)
eGFR: 61 mL/min/{1.73_m2} (ref >59)

## 2021-05-28 LAB — LIPID PANEL
Chol/HDL Ratio: 3.6 ratio (ref 0.0–5.0)
Cholesterol, Total: 147 mg/dL (ref 100–199)
HDL: 41 mg/dL (ref >39)
LDL Chol Calc (NIH): 52 mg/dL (ref 0–99)
Triglycerides: 357 mg/dL — ABNORMAL HIGH (ref 0–149)
VLDL Cholesterol Cal: 54 mg/dL — ABNORMAL HIGH (ref 5–40)

## 2021-05-28 LAB — PSA, TOTAL AND FREE
PSA, Free: <0.01 ng/mL
Prostate Specific Ag, Serum: <0.1 ng/mL (ref 0.0–4.0)

## 2021-05-29 ENCOUNTER — Other Ambulatory Visit: Payer: Self-pay

## 2021-05-29 ENCOUNTER — Ambulatory Visit: Payer: Medicare Other | Admitting: *Deleted

## 2021-05-29 DIAGNOSIS — G8929 Other chronic pain: Secondary | ICD-10-CM | POA: Diagnosis not present

## 2021-05-29 DIAGNOSIS — R600 Localized edema: Secondary | ICD-10-CM | POA: Diagnosis not present

## 2021-05-29 DIAGNOSIS — M25662 Stiffness of left knee, not elsewhere classified: Secondary | ICD-10-CM

## 2021-05-29 DIAGNOSIS — M25562 Pain in left knee: Secondary | ICD-10-CM | POA: Diagnosis not present

## 2021-05-29 LAB — HGB A1C W/O EAG: Hgb A1c MFr Bld: 6.4 % — ABNORMAL HIGH (ref 4.8–5.6)

## 2021-05-29 LAB — SPECIMEN STATUS REPORT

## 2021-05-29 NOTE — Therapy (Signed)
[facility] [place], [place], [address] Phone: [phone]   Fax:  [phone]  Physical Therapy Treatment  Patient Details  Name: [name] [name] MRN: [id number] Date of Birth: [birth year] Referring Provider (PT): Jean Rosenthal MD   Encounter Date: 05/29/2021   PT End of Session - 05/29/21 1116     Visit Number 9    Number of Visits 12    Date for PT Re-Evaluation 07/28/21    Authorization Type FOTO AT LEAST EVERY 5TH VISIT.  PROGRESS NOTE AT 10TH VISIT.  KX MODIFIER AFTER 15 VISITS.    PT Start Time 1030    PT Stop Time 1120    PT Time Calculation (min) 50 min             Past Medical History:  Diagnosis Date   Arthritis    Asthma, chronic 06/02/2013   Carpal tunnel syndrome 04/17/2015   Bilateral   Depression 06/02/2013   DYSPNEA ON EXERTION 06/04/2009   Qualifier: Diagnosis of  By: Domenic Polite, MD, Phillips Hay    Hereditary and idiopathic peripheral neuropathy 04/17/2015   Hyperlipemia 06/02/2013   Hypertension    LEG PAIN, BILATERAL 06/04/2009   Qualifier: Diagnosis of  By: Domenic Polite, MD, FACC, [place], HX OF 06/04/2009   Qualifier: Diagnosis of  By: Domenic Polite, MD, Phillips Hay    Prostate CA [facility])    and basal cell nose    Past Surgical History:  Procedure Laterality Date   CARDIAC CATHETERIZATION  1997   EXCISION MASS NECK Left 11/12/2020   Procedure: EXCISION LEFT POSTERIOR NECK MASS;  Surgeon: Coralie Keens, MD;  Location: [place];  Service: General;  Laterality: Left;   EYE SURGERY Bilateral 2020   cataracts removed   [place] GASTRIC BANDING  [id number]   [facility] fundoplication   MENISCUS REPAIR Left 2012   ROBOT ASSISTED LAPAROSCOPIC RADICAL PROSTATECTOMY     ROTATOR CUFF REPAIR Right 2019   TOTAL KNEE ARTHROPLASTY Right 01/03/2021   Procedure: RIGHT  TOTAL KNEE ARTHROPLASTY;  Surgeon: Mcarthur Rossetti, MD;  [facility];  Service: Orthopedics;  Laterality: Right;  Needs RNFA   TOTAL KNEE ARTHROPLASTY Left 04/25/2021   Procedure: LEFT TOTAL KNEE ARTHROPLASTY;  Surgeon: Mcarthur Rossetti, MD;  [facility];  Service: Orthopedics;  Laterality: Left;    There were no vitals filed for this visit.   Subjective Assessment - 05/29/21 1056     Subjective COVID-19 screen performed prior to patient entering clinic.  Doing great.    Pertinent History CTS, COPD, HTN, Hernia repair, left knee ATS, right RCR, right TKA.    How long can you walk comfortably? Around house.    Patient Stated Goals Return to an active lifestyle.    Currently in Pain? Yes    Pain Score 3     Pain Location Knee    Pain Orientation Left    Pain Descriptors / Indicators Aching;Sore                               [facility] Adult PT Treatment/Exercise - 05/29/21 0001       Exercises   Exercises Knee/Hip      Knee/Hip Exercises: Aerobic   Recumbent Bike Level 2 x 15 minutes.      Knee/Hip Exercises:  Machines for Strengthening   Cybex Knee Extension 20# x 3 minutes.    Cybex Knee Flexion 40# x 3 minutes.    Cybex Leg Press 2.5 plates x 3 minutes.      Vasopneumatic   Number Minutes Vasopneumatic  15 minutes    Vasopnuematic Location  --   Left knee.   Vasopneumatic Pressure Medium    Vasopneumatic Temperature  34 for edema      Manual Therapy   Manual Therapy Passive ROM;Soft tissue mobilization    Passive ROM In supine:  patella mobs, passive extension with active quad sets                          PT Long Term Goals - 04/29/21 1233       PT LONG TERM GOAL #1   Title Independent with a HEP.    Time 6    Period Weeks    Status New      PT LONG TERM GOAL #3   Title Active left knee flexion to 120 to 125 degrees+ so the patient can perform functional tasks and do so with pain not > 2-3/10.    Time 6     Period Weeks    Status New      PT LONG TERM GOAL #4   Title Perform a reciprocating stair gait with one railing with pain not > 2-3/10.    Time 6    Period Weeks    Status New                   Plan - 05/29/21 1117     Clinical Impression Statement Pt arrived today doing fairly well, but reports having some sharp pains last night LT knee. Rx focused on strengthening and extension ROM today. Pt did well with Rx and continues to progress.towards LTGs.    Personal Factors and Comorbidities Comorbidity 1;Comorbidity 2;Other    Comorbidities CTS, COPD, HTN, Hernia repair, left knee ATS, right RCR, right TKA.    Examination-Activity Limitations Other;Locomotion Level    Examination-Participation Restrictions Other    Stability/Clinical Decision Making Stable/Uncomplicated    Rehab Potential Excellent    PT Duration 6 weeks    PT Treatment/Interventions ADLs/Self Care Home Management;Cryotherapy;Electrical Stimulation;Gait training;Stair training;Functional mobility training;Therapeutic activities;Therapeutic exercise;Neuromuscular re-education;Manual techniques;Patient/family education;Passive range of motion;Vasopneumatic Device    PT Next Visit Plan Continue with resisted weight machines.    Consulted and Agree with Plan of Care Patient             Patient will benefit from skilled therapeutic intervention in order to improve the following deficits and impairments:  Abnormal gait, Decreased activity tolerance, Decreased range of motion, Decreased strength, Increased edema, Pain  Visit Diagnosis: Chronic pain of left knee  Localized edema  Stiffness of left knee, not elsewhere classified     Problem List Patient Active Problem List   Diagnosis Date Noted   Status post total left knee replacement 04/25/2021   Real time reverse transcriptase PCR positive for COVID-19 virus 03/21/2021   Status post total right knee replacement 01/03/2021   Diverticular disease of  colon 09/25/2020   Colon cancer screening 09/25/2020   Statin intolerance 12/27/2018   BMI 28.0-28.9,adult 11/01/2018   H/O arthroscopy of shoulder 02/10/2018   Primary osteoarthritis of left knee 11/25/2016   Primary osteoarthritis of right knee 11/25/2016   Essential hypertension 06/28/2015   H/O prostate cancer 06/28/2015   Hereditary and idiopathic  peripheral neuropathy 04/17/2015   Carpal tunnel syndrome 04/17/2015   Hyperlipemia 06/02/2013   Asthma, chronic 06/02/2013   Depression 06/02/2013   GERD 06/04/2009   LEG PAIN, BILATERAL 06/04/2009   PERCUTANEOUS TRANSLUMINAL CORONARY ANGIOPLASTY, HX OF 06/04/2009    Saylee Sherrill,CHRIS, PTA 05/29/2021, 1:11 PM  [facility] [address] [place], [place], [address] Phone: [phone]   Fax:  [phone]  Name: [name] [name] MRN: [id number] Date of Birth: Dec 31, 1945

## 2021-05-31 NOTE — Procedures (Signed)
Lumbar Epidural Steroid Injection - Interlaminar Approach with Fluoroscopic Guidance  Patient: [name] [name]      Date of Birth: [birth year] MRN: [id number] PCP: Chevis Pretty, FNP      Visit Date: 05/15/2021   Universal Protocol:     Consent Given By: the patient  Position: PRONE  Additional Comments: Vital signs were monitored before and after the procedure. Patient was prepped and draped in the usual sterile fashion. The correct patient, procedure, and site was verified.   Injection Procedure Details:   Procedure diagnoses: Spinal stenosis of lumbar region with neurogenic claudication [M48.062]   Meds Administered:  Meds ordered this encounter  Medications   betamethasone acetate-betamethasone sodium phosphate (CELESTONE) injection 12 mg     Laterality: Left  Location/Site:  L4-5  Needle: 4.5 in., 20 ga. Tuohy  Needle Placement: Paramedian epidural  Findings:   -Comments: Excellent flow of contrast into the epidural space.  Procedure Details: Using a paramedian approach from the side mentioned above, the region overlying the inferior lamina was localized under fluoroscopic visualization and the soft tissues overlying this structure were infiltrated with 4 ml. of 1% Lidocaine without Epinephrine. The Tuohy needle was inserted into the epidural space using a paramedian approach.   The epidural space was localized using loss of resistance along with counter oblique bi-planar fluoroscopic views.  After negative aspirate for air, blood, and CSF, a 2 ml. volume of Isovue-250 was injected into the epidural space and the flow of contrast was observed. Radiographs were obtained for documentation purposes.    The injectate was administered into the level noted above.   Additional Comments:  The patient tolerated the procedure well Dressing: 2 x 2 sterile gauze and Band-Aid    Post-procedure details: Patient was observed during the procedure. Post-procedure  instructions were reviewed.  Patient left the clinic in stable condition.

## 2021-05-31 NOTE — Progress Notes (Signed)
[name] [name] - 75 y.o. male MRN [id number]  Date of birth: 1946/04/27  Office Visit Note: Visit Date: 05/15/2021 PCP: Chevis Pretty, FNP Referred by: Hassell Done, Mary-Margaret, *  Subjective: Chief Complaint  Patient presents with   Lower Back - Pain   HPI:  [name] [name] is a 75 y.o. male who comes in today at the request of Dr. Anderson Malta for planned Left L4-5 Lumbar Interlaminar epidural steroid injection with fluoroscopic guidance.  The patient has failed conservative care including home exercise, medications, time and activity modification.  This injection will be diagnostic and hopefully therapeutic.  Please see requesting physician notes for further details and justification.  Patient with recent left total knee arthroplasty.   ROS Otherwise per HPI.  Assessment & Plan: Visit Diagnoses:    ICD-10-CM   1. Spinal stenosis of lumbar region with neurogenic claudication  M48.062 XR C-ARM NO REPORT    Epidural Steroid injection    betamethasone acetate-betamethasone sodium phosphate (CELESTONE) injection 12 mg      Plan: No additional findings.   Meds & Orders:  Meds ordered this encounter  Medications   betamethasone acetate-betamethasone sodium phosphate (CELESTONE) injection 12 mg    Orders Placed This Encounter  Procedures   XR C-ARM NO REPORT   Epidural Steroid injection    Follow-up: No follow-ups on file.   Procedures: No procedures performed  Lumbar Epidural Steroid Injection - Interlaminar Approach with Fluoroscopic Guidance  Patient: [name] [name]      Date of Birth: May 30, 1946 MRN: [id number] PCP: Chevis Pretty, FNP      Visit Date: 05/15/2021   Universal Protocol:     Consent Given By: the patient  Position: PRONE  Additional Comments: Vital signs were monitored before and after the procedure. Patient was prepped and draped in the usual sterile fashion. The correct patient, procedure, and site was verified.   Injection  Procedure Details:   Procedure diagnoses: Spinal stenosis of lumbar region with neurogenic claudication [M48.062]   Meds Administered:  Meds ordered this encounter  Medications   betamethasone acetate-betamethasone sodium phosphate (CELESTONE) injection 12 mg     Laterality: Left  Location/Site:  L4-5  Needle: 4.5 in., 20 ga. Tuohy  Needle Placement: Paramedian epidural  Findings:   -Comments: Excellent flow of contrast into the epidural space.  Procedure Details: Using a paramedian approach from the side mentioned above, the region overlying the inferior lamina was localized under fluoroscopic visualization and the soft tissues overlying this structure were infiltrated with 4 ml. of 1% Lidocaine without Epinephrine. The Tuohy needle was inserted into the epidural space using a paramedian approach.   The epidural space was localized using loss of resistance along with counter oblique bi-planar fluoroscopic views.  After negative aspirate for air, blood, and CSF, a 2 ml. volume of Isovue-250 was injected into the epidural space and the flow of contrast was observed. Radiographs were obtained for documentation purposes.    The injectate was administered into the level noted above.   Additional Comments:  The patient tolerated the procedure well Dressing: 2 x 2 sterile gauze and Band-Aid    Post-procedure details: Patient was observed during the procedure. Post-procedure instructions were reviewed.  Patient left the clinic in stable condition.   Clinical History: MRI LUMBAR SPINE WITHOUT CONTRAST   TECHNIQUE: Multiplanar, multisequence MR imaging of the lumbar spine was performed. No intravenous contrast was administered.   COMPARISON:  MRI lumbar spine 02/28/2007   FINDINGS: Segmentation:  Normal  Alignment: Mild retrolisthesis L1-2 and L2-3. Mild anterolisthesis L4-5.   Vertebrae:  Normal bone marrow.  Negative for fracture or mass.   Conus medullaris and  cauda equina: Conus extends to the L1 level. Conus and cauda equina appear normal.   Paraspinal and other soft tissues: Negative for paraspinous mass or adenopathy. Left renal cyst.   Disc levels:   L1-2: Mild disc degeneration and Schmorl's node. Negative for stenosis   L2-3: Mild disc bulging and mild facet degeneration. Negative for disc protrusion or stenosis.   L3-4: Mild disc degeneration and disc bulging. Left lateral osteophyte formation unchanged from prior study. Moderate facet degeneration has progressed in the interval. Mild subarticular stenosis on the left has progressed. Spinal canal adequate in size.   L4-5: 3 mm anterolisthesis. Severe facet degeneration which has progressed significantly in the interval. Moderate spinal stenosis with progression. Neural foramina patent bilaterally.   L5-S1: Disc degeneration and spurring asymmetric to the right. Moderate facet hypertrophy bilaterally. Moderate subarticular and foraminal stenosis on the right due to spurring. This has progressed significantly in the interval.   IMPRESSION: Moderate spinal stenosis L4-5, with progression since 2008. 3 mm anterolisthesis with severe facet degeneration which has progressed significantly since 2008   Moderate subarticular and foraminal stenosis on the right due to spurring which has progressed significantly since 2008.     Electronically Signed   By: Franchot Gallo M.D.   On: 07/21/2020 20:25     Objective:  VS:  HT:    WT:   BMI:     BP:(!) 155/74  HR:67bpm  TEMP: ( )  RESP:  Physical Exam Vitals and nursing note reviewed.  Constitutional:      General: He is not in acute distress.    Appearance: Normal appearance. He is obese. He is not ill-appearing.  HENT:     Head: Normocephalic and atraumatic.     Right Ear: External ear normal.     Left Ear: External ear normal.     Nose: No congestion.  Eyes:     Extraocular Movements: Extraocular movements intact.   Cardiovascular:     Rate and Rhythm: Normal rate.     Pulses: Normal pulses.  Pulmonary:     Effort: Pulmonary effort is normal. No respiratory distress.  Abdominal:     General: There is no distension.     Palpations: Abdomen is soft.  Musculoskeletal:        General: No tenderness or signs of injury.     Cervical back: Neck supple.     Right lower leg: No edema.     Left lower leg: No edema.     Comments: Patient has good distal strength without clonus.  Skin:    Findings: No erythema or rash.  Neurological:     General: No focal deficit present.     Mental Status: He is alert and oriented to person, place, and time.     Sensory: No sensory deficit.     Motor: No weakness or abnormal muscle tone.     Coordination: Coordination normal.  Psychiatric:        Mood and Affect: Mood normal.        Behavior: Behavior normal.     Imaging: No results found.

## 2021-06-03 ENCOUNTER — Other Ambulatory Visit: Payer: Self-pay

## 2021-06-03 ENCOUNTER — Ambulatory Visit: Payer: Medicare Other | Admitting: *Deleted

## 2021-06-03 DIAGNOSIS — M25562 Pain in left knee: Secondary | ICD-10-CM | POA: Diagnosis not present

## 2021-06-03 DIAGNOSIS — G8929 Other chronic pain: Secondary | ICD-10-CM | POA: Diagnosis not present

## 2021-06-03 DIAGNOSIS — R600 Localized edema: Secondary | ICD-10-CM

## 2021-06-03 DIAGNOSIS — M25662 Stiffness of left knee, not elsewhere classified: Secondary | ICD-10-CM | POA: Diagnosis not present

## 2021-06-03 NOTE — Therapy (Signed)
[facility] [place], [place], [address] Phone: [phone]   Fax:  [phone]  Physical Therapy Treatment  Patient Details  Name: [name] [name] MRN: [id number] Date of Birth: [birth year] Referring Provider (PT): Jean Rosenthal MD   Encounter Date: 06/03/2021   PT End of Session - 06/03/21 1157     Visit Number 10    Number of Visits 12    Date for PT Re-Evaluation 07/28/21    Authorization Type FOTO AT LEAST EVERY 5TH VISIT.  PROGRESS NOTE AT 10TH VISIT.  KX MODIFIER AFTER 15 VISITS.    PT Start Time 1115    PT Stop Time 1205    PT Time Calculation (min) 50 min             Past Medical History:  Diagnosis Date   Arthritis    Asthma, chronic 06/02/2013   Carpal tunnel syndrome 04/17/2015   Bilateral   Depression 06/02/2013   DYSPNEA ON EXERTION 06/04/2009   Qualifier: Diagnosis of  By: Domenic Polite, MD, Phillips Hay    Hereditary and idiopathic peripheral neuropathy 04/17/2015   Hyperlipemia 06/02/2013   Hypertension    LEG PAIN, BILATERAL 06/04/2009   Qualifier: Diagnosis of  By: Domenic Polite, MD, FACC, [place], HX OF 06/04/2009   Qualifier: Diagnosis of  By: Domenic Polite, MD, Phillips Hay    Prostate CA [facility])    and basal cell nose    Past Surgical History:  Procedure Laterality Date   CARDIAC CATHETERIZATION  1997   EXCISION MASS NECK Left 11/12/2020   Procedure: EXCISION LEFT POSTERIOR NECK MASS;  Surgeon: Coralie Keens, MD;  Location: [place];  Service: General;  Laterality: Left;   EYE SURGERY Bilateral 2020   cataracts removed   [place] GASTRIC BANDING  [id number]   [facility] fundoplication   MENISCUS REPAIR Left 2012   ROBOT ASSISTED LAPAROSCOPIC RADICAL PROSTATECTOMY     ROTATOR CUFF REPAIR Right 2019   TOTAL KNEE ARTHROPLASTY Right 01/03/2021   Procedure: RIGHT  TOTAL KNEE ARTHROPLASTY;  Surgeon: Mcarthur Rossetti, MD;  [facility];  Service: Orthopedics;  Laterality: Right;  Needs RNFA   TOTAL KNEE ARTHROPLASTY Left 04/25/2021   Procedure: LEFT TOTAL KNEE ARTHROPLASTY;  Surgeon: Mcarthur Rossetti, MD;  Location[facility];  Service: Orthopedics;  Laterality: Left;    There were no vitals filed for this visit.   Subjective Assessment - 06/03/21 1138     Subjective COVID-19 screen performed prior to patient entering clinic.  Doing great.   To MD after next visit    Pertinent History CTS, COPD, HTN, Hernia repair, left knee ATS, right RCR, right TKA.    How long can you walk comfortably? Around house.    Patient Stated Goals Return to an active lifestyle.    Currently in Pain? Yes    Pain Score 3     Pain Location Knee    Pain Orientation Left    Pain Descriptors / Indicators Aching;Sore    Pain Type Surgical pain    Pain Onset More than a month ago                               [facility] Adult PT Treatment/Exercise - 06/03/21 0001       Exercises   Exercises Knee/Hip  Knee/Hip Exercises: Aerobic   Recumbent Bike Level 2 x 15 minutes.      Knee/Hip Exercises: Machines for Strengthening   Cybex Knee Extension 20# x 3 minutes.    Cybex Knee Flexion 40# x 3 minutes.    Cybex Leg Press 2.5 plates x 3 minutes., 3 pl x 1 min      Vasopneumatic   Number Minutes Vasopneumatic  15 minutes    Vasopnuematic Location  --   Left knee.   Vasopneumatic Pressure Medium    Vasopneumatic Temperature  34 for edema      Manual Therapy   Manual Therapy Passive ROM;Soft tissue mobilization    Passive ROM In supine:  patella mobs, passive extension with active quad sets                          PT Long Term Goals - 04/29/21 1233       PT LONG TERM GOAL #1   Title Independent with a HEP.    Time 6    Period Weeks    Status New      PT LONG TERM GOAL #3   Title Active left knee flexion to 120  to 125 degrees+ so the patient can perform functional tasks and do so with pain not > 2-3/10.    Time 6    Period Weeks    Status New      PT LONG TERM GOAL #4   Title Perform a reciprocating stair gait with one railing with pain not > 2-3/10.    Time 6    Period Weeks    Status New                   Plan - 06/03/21 1157     Clinical Impression Statement Pt arrived today doing fairly well and was able to keep progressing with machines and was able to progress to 3 pl on Leg press. Manual PT performed for extension progression.. Normal vaso end of session. MD note next visit    Personal Factors and Comorbidities Comorbidity 1;Comorbidity 2;Other    Comorbidities CTS, COPD, HTN, Hernia repair, left knee ATS, right RCR, right TKA.    Stability/Clinical Decision Making Stable/Uncomplicated    Rehab Potential Excellent    PT Duration 6 weeks    PT Treatment/Interventions ADLs/Self Care Home Management;Cryotherapy;Electrical Stimulation;Gait training;Stair training;Functional mobility training;Therapeutic activities;Therapeutic exercise;Neuromuscular re-education;Manual techniques;Patient/family education;Passive range of motion;Vasopneumatic Device    PT Next Visit Plan MD note after next visit. To MD Thursday    Consulted and Agree with Plan of Care Patient             Patient will benefit from skilled therapeutic intervention in order to improve the following deficits and impairments:  Abnormal gait, Decreased activity tolerance, Decreased range of motion, Decreased strength, Increased edema, Pain  Visit Diagnosis: Chronic pain of left knee  Localized edema  Stiffness of left knee, not elsewhere classified     Problem List Patient Active Problem List   Diagnosis Date Noted   Status post total left knee replacement 04/25/2021   Real time reverse transcriptase PCR positive for COVID-19 virus 03/21/2021   Status post total right knee replacement 01/03/2021    Diverticular disease of colon 09/25/2020   Colon cancer screening 09/25/2020   Statin intolerance 12/27/2018   BMI 28.0-28.9,adult 11/01/2018   H/O arthroscopy of shoulder 02/10/2018   Primary osteoarthritis of left knee 11/25/2016   Primary osteoarthritis  of right knee 11/25/2016   Essential hypertension 06/28/2015   H/O prostate cancer 06/28/2015   Hereditary and idiopathic peripheral neuropathy 04/17/2015   Carpal tunnel syndrome 04/17/2015   Hyperlipemia 06/02/2013   Asthma, chronic 06/02/2013   Depression 06/02/2013   GERD 06/04/2009   LEG PAIN, BILATERAL 06/04/2009   PERCUTANEOUS TRANSLUMINAL CORONARY ANGIOPLASTY, HX OF 06/04/2009    Cecelia Graciano,CHRIS, PTA 06/03/2021, 12:12 PM  [facility] [address] [place], [place], [address] Phone: [phone]   Fax:  [phone]  Name: [name] [name] MRN: [id number] Date of Birth: August 05, 1946

## 2021-06-05 ENCOUNTER — Ambulatory Visit: Payer: Medicare Other | Admitting: *Deleted

## 2021-06-05 ENCOUNTER — Other Ambulatory Visit: Payer: Self-pay

## 2021-06-05 ENCOUNTER — Ambulatory Visit (INDEPENDENT_AMBULATORY_CARE_PROVIDER_SITE_OTHER): Payer: Medicare Other | Admitting: Orthopaedic Surgery

## 2021-06-05 ENCOUNTER — Encounter: Payer: Self-pay | Admitting: Orthopaedic Surgery

## 2021-06-05 DIAGNOSIS — R600 Localized edema: Secondary | ICD-10-CM | POA: Diagnosis not present

## 2021-06-05 DIAGNOSIS — M25562 Pain in left knee: Secondary | ICD-10-CM

## 2021-06-05 DIAGNOSIS — G8929 Other chronic pain: Secondary | ICD-10-CM

## 2021-06-05 DIAGNOSIS — Z96652 Presence of left artificial knee joint: Secondary | ICD-10-CM

## 2021-06-05 DIAGNOSIS — M25662 Stiffness of left knee, not elsewhere classified: Secondary | ICD-10-CM

## 2021-06-05 NOTE — Progress Notes (Signed)
The patient is now 6 weeks status post a left total knee arthroplasty in 5 months status post right total knee arthroplasty.  75 years old.  Since we had seen him last has been through therapy and had steroid injection which was an epidural by Dr. Ernestina Patches.  He says he is doing great overall.  He is still having some problem getting sleep at night but he said that is improving.  Both knees have some swelling to be expected but excellent range of motion.  He is walking without a limp or assistive device.  Both knees are ligamentously stable with excellent motion.  From my standpoint I will need to see him back for 6 months unless he is having any issues.  All question concerns were answered and addressed.  At his next visit in 6 months we will have a standing AP and lateral both knees.

## 2021-06-05 NOTE — Therapy (Addendum)
[facility] [place], [place], [address] Phone: [phone]   Fax:  [phone]  Physical Therapy Treatment  Patient Details  Name: ANDREI MCCOOK MRN: [id number] Date of Birth: Oct 28, 1945 Referring Provider (PT): Jean Rosenthal MD   Encounter Date: 06/05/2021   PT End of Session - 06/05/21 1138     Visit Number 11    Number of Visits 12    Date for PT Re-Evaluation 07/28/21    Authorization Type FOTO AT LEAST EVERY 5TH VISIT.  PROGRESS NOTE AT 10TH VISIT.  KX MODIFIER AFTER 15 VISITS.    PT Start Time 1115    PT Stop Time 1205    PT Time Calculation (min) 50 min    Activity Tolerance Patient tolerated treatment well    Behavior During Therapy WFL for tasks assessed/performed             Past Medical History:  Diagnosis Date   Arthritis    Asthma, chronic 06/02/2013   Carpal tunnel syndrome 04/17/2015   Bilateral   Depression 06/02/2013   DYSPNEA ON EXERTION 06/04/2009   Qualifier: Diagnosis of  By: Domenic Polite, MD, Phillips Hay    Hereditary and idiopathic peripheral neuropathy 04/17/2015   Hyperlipemia 06/02/2013   Hypertension    LEG PAIN, BILATERAL 06/04/2009   Qualifier: Diagnosis of  By: Domenic Polite, MD, FACC, [place], HX OF 06/04/2009   Qualifier: Diagnosis of  By: Domenic Polite, MD, Phillips Hay    Prostate CA [facility])    and basal cell nose    Past Surgical History:  Procedure Laterality Date   CARDIAC CATHETERIZATION  1997   EXCISION MASS NECK Left 11/12/2020   Procedure: EXCISION LEFT POSTERIOR NECK MASS;  Surgeon: Coralie Keens, MD;  Location: [place];  Service: General;  Laterality: Left;   EYE SURGERY Bilateral 2020   cataracts removed   [place] GASTRIC BANDING  [id number]   [facility] fundoplication   MENISCUS REPAIR Left 2012   ROBOT ASSISTED LAPAROSCOPIC  RADICAL PROSTATECTOMY     ROTATOR CUFF REPAIR Right 2019   TOTAL KNEE ARTHROPLASTY Right 01/03/2021   Procedure: RIGHT TOTAL KNEE ARTHROPLASTY;  Surgeon: Mcarthur Rossetti, MD;  [facility];  Service: Orthopedics;  Laterality: Right;  Needs RNFA   TOTAL KNEE ARTHROPLASTY Left 04/25/2021   Procedure: LEFT TOTAL KNEE ARTHROPLASTY;  Surgeon: Mcarthur Rossetti, MD;  [facility];  Service: Orthopedics;  Laterality: Left;    There were no vitals filed for this visit.   Subjective Assessment - 06/05/21 1136     Subjective COVID-19 screen performed prior to patient entering clinic.  Doing great.   To MD today at 2:00    Pertinent History CTS, COPD, HTN, Hernia repair, left knee ATS, right RCR, right TKA.    How long can you walk comfortably? Around house.    Patient Stated Goals Return to an active lifestyle.    Currently in Pain? Yes    Pain Score 3     Pain Location Knee    Pain Orientation Left    Pain Descriptors / Indicators Aching;Sore    Pain Type Surgical pain    Pain Onset More than a month ago  PT Long Term Goals - 06/05/21 1139       PT LONG TERM GOAL #1   Title Independent with a HEP.    Time 6    Period Weeks    Status Achieved      PT LONG TERM GOAL #2   Title Full right active knee extension in order to normalize gait.    Baseline AROM -8 degrees 06/05/21   = with RT knee    Period Weeks    Status Not Met      PT LONG TERM GOAL #3   Title Active left knee flexion to 120 to 125 degrees+ so the patient can perform functional tasks and do so with pain not > 2-3/10.    Time 6    Period Weeks      PT LONG TERM GOAL #4   Title Perform a reciprocating stair gait with one railing with pain not > 2-3/10.    Time 6    Period Weeks    Status Achieved                   Plan - 06/05/21 1138     Clinical Impression Statement Pt  arrived today doing fairly well and  will f/u with MD today with possible DC. Rx focused on ROM and strengthening for LT LE. PT's ROM is 8-130 degrees today and has met all LTGs except full extension.    Personal Factors and Comorbidities Comorbidity 1;Comorbidity 2;Other    Comorbidities CTS, COPD, HTN, Hernia repair, left knee ATS, right RCR, right TKA.    Stability/Clinical Decision Making Stable/Uncomplicated    Rehab Potential Excellent    PT Duration 6 weeks    PT Treatment/Interventions ADLs/Self Care Home Management;Cryotherapy;Electrical Stimulation;Gait training;Stair training;Functional mobility training;Therapeutic activities;Therapeutic exercise;Neuromuscular re-education;Manual techniques;Patient/family education;Passive range of motion;Vasopneumatic Device    PT Next Visit Plan MD note today    Consulted and Agree with Plan of Care Patient             Patient will benefit from skilled therapeutic intervention in order to improve the following deficits and impairments:  Abnormal gait, Decreased activity tolerance, Decreased range of motion, Decreased strength, Increased edema, Pain  Visit Diagnosis: Chronic pain of left knee  Localized edema  Stiffness of left knee, not elsewhere classified     Problem List Patient Active Problem List   Diagnosis Date Noted   Status post total left knee replacement 04/25/2021   Real time reverse transcriptase PCR positive for COVID-19 virus 03/21/2021   Status post total right knee replacement 01/03/2021   Diverticular disease of colon 09/25/2020   Colon cancer screening 09/25/2020   Statin intolerance 12/27/2018   BMI 28.0-28.9,adult 11/01/2018   H/O arthroscopy of shoulder 02/10/2018   Primary osteoarthritis of left knee 11/25/2016   Primary osteoarthritis of right knee 11/25/2016   Essential hypertension 06/28/2015   H/O prostate cancer 06/28/2015   Hereditary and idiopathic peripheral neuropathy 04/17/2015   Carpal tunnel syndrome 04/17/2015   Hyperlipemia  06/02/2013   Asthma, chronic 06/02/2013   Depression 06/02/2013   GERD 06/04/2009   LEG PAIN, BILATERAL 06/04/2009   PERCUTANEOUS TRANSLUMINAL CORONARY ANGIOPLASTY, HX OF 06/04/2009    RAMSEUR,CHRIS, PTA 06/05/2021, 12:26 PM  [facility] [address] [place], [place], [address] Phone: [phone]   Fax:  [phone]  Name: Amad L Ankney MRN: [id number] Date of Birth: 12/14/1945  PHYSICAL THERAPY DISCHARGE SUMMARY  Visits from Start of Care: 11.  Current functional   level related to goals / functional outcomes: See above.   Remaining deficits: See goal section.   Education / Equipment: HEP.   Patient agrees to discharge. Patient goals were partially met. Patient is being discharged due to being pleased with progress.    Chad Applegate MPT  

## 2021-06-18 ENCOUNTER — Ambulatory Visit (INDEPENDENT_AMBULATORY_CARE_PROVIDER_SITE_OTHER): Payer: Medicare Other

## 2021-06-18 ENCOUNTER — Other Ambulatory Visit: Payer: Self-pay

## 2021-06-18 DIAGNOSIS — Z23 Encounter for immunization: Secondary | ICD-10-CM | POA: Diagnosis not present

## 2021-06-19 ENCOUNTER — Other Ambulatory Visit: Payer: Self-pay | Admitting: Nurse Practitioner

## 2021-07-10 DIAGNOSIS — Z23 Encounter for immunization: Secondary | ICD-10-CM | POA: Diagnosis not present

## 2021-07-22 NOTE — Progress Notes (Deleted)
Cardiology Office Note:    Date:  07/22/2021   ID:  [name] [name], DOB [birth year], MRN [id number]  PCP:  Chevis Pretty, [place]  Cardiologist:  None  Advanced Practice Provider:  No care team member to display Electrophysiologist:  None   Referring MD: Hassell Done, Mary-Margaret, *    History of Present Illness:    [name] [name] is a 75 y.o. male with a hx of COPD, hyperlipidemia, prior history of prostate cancer now in remission, basal cell carcinoma s/p excision and palpitations who was previously followed by Dr. [name] who now returns to clinic for follow-up.  Last seen in clinic by me on 11/19/20 prior to right TKA. We obtained myoview due to limited mobility in the setting of severe arthritis which showed no evidence of ischemia or infarction. He was cleared for his surgery.  Today,   Past Medical History:  Diagnosis Date   Arthritis    Asthma, chronic 06/02/2013   Carpal tunnel syndrome 04/17/2015   Bilateral   Depression 06/02/2013   DYSPNEA ON EXERTION 06/04/2009   Qualifier: Diagnosis of  By: Domenic Polite, MD, Phillips Hay    Hereditary and idiopathic peripheral neuropathy 04/17/2015   Hyperlipemia 06/02/2013   Hypertension    LEG PAIN, BILATERAL 06/04/2009   Qualifier: Diagnosis of  By: Domenic Polite, MD, FACC, [place], HX OF 06/04/2009   Qualifier: Diagnosis of  By: Domenic Polite, MD, Phillips Hay    Prostate CA [facility])    and basal cell nose    Past Surgical History:  Procedure Laterality Date   CARDIAC CATHETERIZATION  1997   EXCISION MASS NECK Left 11/12/2020   Procedure: EXCISION LEFT POSTERIOR NECK MASS;  Surgeon: Coralie Keens, MD;  Location: [place];  Service: General;  Laterality: Left;   EYE SURGERY Bilateral 2020   cataracts removed   [place] GASTRIC BANDING  4098   [facility]  fundoplication   MENISCUS REPAIR Left 2012   ROBOT ASSISTED LAPAROSCOPIC RADICAL PROSTATECTOMY     ROTATOR CUFF REPAIR Right 2019   TOTAL KNEE ARTHROPLASTY Right 01/03/2021   Procedure: RIGHT TOTAL KNEE ARTHROPLASTY;  Surgeon: Mcarthur Rossetti, MD;  [facility];  Service: Orthopedics;  Laterality: Right;  Needs RNFA   TOTAL KNEE ARTHROPLASTY Left 04/25/2021   Procedure: LEFT TOTAL KNEE ARTHROPLASTY;  Surgeon: Mcarthur Rossetti, MD;  [facility];  Service: Orthopedics;  Laterality: Left;    Current Medications: No outpatient medications have been marked as taking for the 08/05/21 encounter (Appointment) with Freada Bergeron, MD.     Allergies:   Fenofibrate and Rosuvastatin   Social History   Socioeconomic History   Marital status: Married    Spouse name: [name]   Number of children: 3   Years of education: 18   Highest education level: Master's degree (e.g., MA, MS, MEng, MEd, MSW, MBA)  Occupational History   Occupation: Retired    Comment: Nature conservation officer  Tobacco Use   Smoking status: Former    Packs/day: 1.00    Years: 20.00    Pack years: 20.00    Types: Cigarettes    Quit date: 01/18/1993    Years since quitting: 28.5   Smokeless tobacco: Never  Vaping Use   Vaping Use: Never used  Substance and Sexual Activity   Alcohol use: Yes    Alcohol/week: 2.0  standard drinks    Types: 2 Cans of beer per week    Comment: occasional   Drug use: No   Sexual activity: Yes  Other Topics Concern   Not on file  Social History Narrative   Patient is married and lives at home with is wife. He has three adult children and five grandchildren. 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    Social Determinants of Health   Financial Resource Strain: Not on file  Food Insecurity: Not on file  Transportation Needs: Not on file  Physical Activity: Not on file  Stress: Not on file  Social Connections: Not on file     Family History: The patient's  family history includes Cancer (age of onset: 47) in his father; Dementia (age of onset: 10) in his mother; Esophageal cancer in his brother; Heart disease in an other family member; Heart failure in his mother.  ROS:   Please see the history of present illness.    Review of Systems  Constitutional:  Negative for chills and fever.  HENT:  Negative for hearing loss and sore throat.   Eyes:  Negative for blurred vision and redness.  Respiratory:  Positive for shortness of breath.   Cardiovascular:  Negative for chest pain, palpitations, orthopnea, claudication, leg swelling and PND.  Gastrointestinal:  Negative for melena, nausea and vomiting.  Genitourinary:  Negative for dysuria and flank pain.  Musculoskeletal:  Positive for joint pain and myalgias.  Neurological:  Negative for dizziness and loss of consciousness.  Endo/Heme/Allergies:  Negative for polydipsia.  Psychiatric/Behavioral:  Negative for substance abuse.    EKGs/Labs/Other Studies Reviewed:    The following studies were reviewed today: TTE 2012-11-29: Study Conclusions   - Left ventricle: The cavity size was normal. Wall thickness    was increased in a pattern of moderate LVH. Systolic    function was normal. The estimated ejection fraction was    in the range of 50% to 55%. Wall motion was normal; there    were no regional wall motion abnormalities. Doppler    parameters are consistent with abnormal left ventricular    relaxation (grade 1 diastolic dysfunction).  - Left atrium: The atrium was mildly dilated.    Myoview 2012/11/29: Negative for ischemia, normal LVEF  EKG:  EKG is  ordered today.  The ekg ordered today demonstrates NSR with LAFB, IVCD.  Recent Labs: 05/27/2021: ALT 13; BUN 14; Creatinine, Ser 1.24; Hemoglobin 12.9; Platelets 276; Potassium 4.6; Sodium 139  Recent Lipid Panel    Component Value Date/Time   CHOL 147 05/27/2021 1539   TRIG 357 (H) 05/27/2021 1539   TRIG 244 (H) 10/24/2014 1201   HDL 41  05/27/2021 1539   HDL 43 10/24/2014 1201   CHOLHDL 3.6 05/27/2021 1539   CHOLHDL 7.0 (H) 08/31/2016 0800   VLDL 65 (H) 08/31/2016 0800   LDLCALC 52 05/27/2021 1539   LDLCALC 81 01/11/2014 0840   LDLDIRECT 92.8 08/28/2014 1041      Physical Exam:    VS:  There were no vitals taken for this visit.    Wt Readings from Last 3 Encounters:  05/27/21 252 lb (114.3 kg)  04/25/21 253 lb 8.5 oz (115 kg)  04/18/21 254 lb (115.2 kg)     GEN:  Well nourished, well developed in no acute distress HEENT: Normal NECK: No JVD; No carotid bruits CARDIAC: RRR, no murmurs, rubs, gallops RESPIRATORY:  Clear to auscultation without rales, wheezing or rhonchi  ABDOMEN: Soft, non-tender, non-distended MUSCULOSKELETAL:  No edema; No deformity  SKIN: Warm and dry NEUROLOGIC:  Alert and oriented x 3 PSYCHIATRIC:  Normal affect   ASSESSMENT:    No diagnosis found.  PLAN:    In order of problems listed above:  #Palpitations : E-cardio monitor with 0% Afib. TTE with mild LA enlargement. On ASA. -Continue to monitor -Continue ASA 81mg  daily -Continue atenolol 25mg  daily  #HTN:  Well controlled -Continue amlodipine 5mg  -Continue atenolol 25mg    #HLD: Intolerant to simva, atorva and crestor. -Continue pravastatin 10mg  -Continue zetia 10mg  daily -Goal LDL <100    Medication Adjustments/Labs and Tests Ordered: Current medicines are reviewed at length with the patient today.  Concerns regarding medicines are outlined above.  No orders of the defined types were placed in this encounter.  No orders of the defined types were placed in this encounter.   There are no Patient Instructions on file for this visit.    Signed, Freada Bergeron, MD  07/22/2021 9:02 PM    [place]

## 2021-08-01 ENCOUNTER — Telehealth: Payer: Self-pay | Admitting: *Deleted

## 2021-08-01 NOTE — Telephone Encounter (Signed)
Ortho bundle 90 day call completed for Left knee arthroplasty.

## 2021-08-01 NOTE — Telephone Encounter (Signed)
(  Late entry 06/17/21) for 30 day Ortho bundle call and survey. Completed.

## 2021-08-05 ENCOUNTER — Other Ambulatory Visit: Payer: Self-pay

## 2021-08-05 ENCOUNTER — Encounter: Payer: Self-pay | Admitting: Cardiology

## 2021-08-05 ENCOUNTER — Ambulatory Visit (INDEPENDENT_AMBULATORY_CARE_PROVIDER_SITE_OTHER): Payer: Medicare Other | Admitting: Cardiology

## 2021-08-05 VITALS — BP 136/76 | HR 63 | Ht 74.0 in | Wt 257.0 lb

## 2021-08-05 DIAGNOSIS — R002 Palpitations: Secondary | ICD-10-CM

## 2021-08-05 DIAGNOSIS — I10 Essential (primary) hypertension: Secondary | ICD-10-CM | POA: Diagnosis not present

## 2021-08-05 DIAGNOSIS — Z789 Other specified health status: Secondary | ICD-10-CM | POA: Diagnosis not present

## 2021-08-05 DIAGNOSIS — E782 Mixed hyperlipidemia: Secondary | ICD-10-CM

## 2021-08-05 NOTE — Patient Instructions (Signed)
Medication Instructions:   Your physician recommends that you continue on your current medications as directed. Please refer to the Current Medication list given to you today.  *If you need a refill on your cardiac medications before your next appointment, please call your pharmacy*    Follow-Up: At [facility], you and your health needs are our priority.  As part of our continuing mission to provide you with exceptional heart care, we have created designated Provider Care Teams.  These Care Teams include your primary Cardiologist (physician) and Advanced Practice Providers (APPs -  Physician Assistants and Nurse Practitioners) who all work together to provide you with the care you need, when you need it.  We recommend signing up for the patient portal called "MyChart".  Sign up information is provided on this After Visit Summary.  MyChart is used to connect with patients for Virtual Visits (Telemedicine).  Patients are able to view lab/test results, encounter notes, upcoming appointments, etc.  Non-urgent messages can be sent to your provider as well.   To learn more about what you can do with MyChart, go to [email or web address].    Your next appointment:   1 year(s)  The format for your next appointment:   In Person  Provider:    DR. Johney Frame

## 2021-08-05 NOTE — Progress Notes (Signed)
Cardiology Office Note:    Date:  08/05/2021   ID:  [name] [name], DOB Apr 08, 1946, MRN [id number]  PCP:  Chevis Pretty, [place]  Cardiologist:  None  Advanced Practice Provider:  No care team member to display Electrophysiologist:  None   Referring MD: Hassell Done, Mary-Margaret, *    History of Present Illness:    [name] [name] is a 75 y.o. male with a hx of COPD, hyperlipidemia, prior history of prostate cancer now in remission, basal cell carcinoma s/p excision and palpitations who was previously followed by Dr. [name] who now returns to clinic for follow-up.  Last seen in clinic by me on 11/19/20 prior to right TKA. We obtained myoview due to limited mobility in the setting of severe arthritis which showed no evidence of ischemia or infarction. He was cleared for his surgery.  Today, he is doing good. He is recovering well from his bilateral knee arthroplasty. Denies any chest pain, SOB, orthopnea or PND. Has occasional lightheadedness with position changes. No syncope. Blood pressure is well controlled. He is concerned that he developed a large RUE ecchymosis after working in the yard. Was very painful initially but now improved. Not currently on [facility] but takes ASA.   He denies any palpitations, chest pain, lightheadedness, headaches, syncope, orthopnea, PND, lower extremity edema or exertional symptoms.  Past Medical History:  Diagnosis Date   Arthritis    Asthma, chronic 06/02/2013   Carpal tunnel syndrome 04/17/2015   Bilateral   Depression 06/02/2013   DYSPNEA ON EXERTION 06/04/2009   Qualifier: Diagnosis of  By: Domenic Polite, MD, Phillips Hay    Hereditary and idiopathic peripheral neuropathy 04/17/2015   Hyperlipemia 06/02/2013   Hypertension    LEG PAIN, BILATERAL 06/04/2009   Qualifier: Diagnosis of  By: Domenic Polite, MD, FACC, [place], HX OF 06/04/2009   Qualifier:  Diagnosis of  By: Domenic Polite, MD, Phillips Hay    Prostate CA [facility])    and basal cell nose    Past Surgical History:  Procedure Laterality Date   CARDIAC CATHETERIZATION  1997   EXCISION MASS NECK Left 11/12/2020   Procedure: EXCISION LEFT POSTERIOR NECK MASS;  Surgeon: Coralie Keens, MD;  Location: [place];  Service: General;  Laterality: Left;   EYE SURGERY Bilateral 2020   cataracts removed   [place] GASTRIC BANDING  1941   [facility] fundoplication   MENISCUS REPAIR Left 2012   ROBOT ASSISTED LAPAROSCOPIC RADICAL PROSTATECTOMY     ROTATOR CUFF REPAIR Right 2019   TOTAL KNEE ARTHROPLASTY Right 01/03/2021   Procedure: RIGHT TOTAL KNEE ARTHROPLASTY;  Surgeon: Mcarthur Rossetti, MD;  [facility];  Service: Orthopedics;  Laterality: Right;  Needs RNFA   TOTAL KNEE ARTHROPLASTY Left 04/25/2021   Procedure: LEFT TOTAL KNEE ARTHROPLASTY;  Surgeon: Mcarthur Rossetti, MD;  [facility];  Service: Orthopedics;  Laterality: Left;    Current Medications: Current Meds  Medication Sig   amLODipine (NORVASC) 5 MG tablet Take 1 tablet (5 mg total) by mouth daily.   aspirin 81 MG chewable tablet Chew 1 tablet (81 mg total) by mouth 2 (two) times daily.   atenolol (TENORMIN) 25 MG tablet Take 1 tablet (25 mg total) by mouth daily.   budesonide-formoterol (SYMBICORT) 160-4.5 MCG/ACT inhaler Inhale 2 puffs into the lungs 2 (two) times daily.  Cholecalciferol (VITAMIN D3) 25 MCG (1000 UT) CAPS Take 1,000 Units by mouth daily.   citalopram (CELEXA) 20 MG tablet Take 1 tablet (20 mg total) by mouth daily. (NEEDS TO BE SEEN BEFORE NEXT REFILL)   cyanocobalamin (,VITAMIN B-12,) 1000 MCG/ML injection Inject 1 mL (1,000 mcg total) into the muscle every 30 (thirty) days.   ezetimibe (ZETIA) 10 MG tablet Take 1 tablet (10 mg total) by mouth daily.   loratadine (CLARITIN) 10 MG tablet Take 10 mg by mouth daily as needed  for allergies.   methocarbamol (ROBAXIN) 500 MG tablet Take 1 tablet (500 mg total) by mouth every 6 (six) hours as needed for muscle spasms.   montelukast (SINGULAIR) 10 MG tablet Take 1 tablet (10 mg total) by mouth daily.   naproxen (NAPROSYN) 500 MG tablet Take 500 mg by mouth daily.   oxyCODONE (OXY IR/ROXICODONE) 5 MG immediate release tablet Take 1-2 tablets (5-10 mg total) by mouth every 4 (four) hours as needed for moderate pain (pain score 4-6).   pravastatin (PRAVACHOL) 20 MG tablet Take 0.5 tablets (10 mg total) by mouth daily.     Allergies:   Fenofibrate and Rosuvastatin   Social History   Socioeconomic History   Marital status: Married    Spouse name: [name]   Number of children: 3   Years of education: 18   Highest education level: Master's degree (e.g., MA, MS, MEng, MEd, MSW, MBA)  Occupational History   Occupation: Retired    Comment: Nature conservation officer  Tobacco Use   Smoking status: Former    Packs/day: 1.00    Years: 20.00    Pack years: 20.00    Types: Cigarettes    Quit date: 01/18/1993    Years since quitting: 28.5   Smokeless tobacco: Never  Vaping Use   Vaping Use: Never used  Substance and Sexual Activity   Alcohol use: Yes    Alcohol/week: 2.0 standard drinks    Types: 2 Cans of beer per week    Comment: occasional   Drug use: No   Sexual activity: Yes  Other Topics Concern   Not on file  Social History Narrative   Patient is married and lives at home with is wife. He has three adult children and five grandchildren. 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    Social Determinants of Health   Financial Resource Strain: Not on file  Food Insecurity: Not on file  Transportation Needs: Not on file  Physical Activity: Not on file  Stress: Not on file  Social Connections: Not on file     Family History: The patient's family history includes Cancer (age of onset: 17) in his father; Dementia (age of onset: 82) in his mother; Esophageal  cancer in his brother; Heart disease in an other family member; Heart failure in his mother.  ROS:   Please see the history of present illness.    Review of Systems  Constitutional:  Negative for chills and fever.  HENT:  Negative for hearing loss and sore throat.   Eyes:  Negative for blurred vision and redness.  Respiratory:  Positive for shortness of breath.   Cardiovascular:  Negative for chest pain, palpitations, orthopnea, claudication, leg swelling and PND.  Gastrointestinal:  Negative for melena, nausea and vomiting.  Genitourinary:  Negative for dysuria and flank pain.  Musculoskeletal:  Positive for joint pain and myalgias.  Neurological:  Positive for tingling. Negative for dizziness and loss of consciousness.  Endo/Heme/Allergies:  Negative for polydipsia. Bruises/bleeds easily.  Psychiatric/Behavioral:  Negative for substance abuse.    EKGs/Labs/Other Studies Reviewed:    The following studies were reviewed today: Myoview Lexiscan 11/25/20 The left ventricular ejection fraction is mildly decreased (45-54%). Nuclear stress EF: 50%. Mild global hypokinesis. There was no ST segment deviation noted during stress. This is a low risk study. There is no evidence of old infarct or ischemia pattern. Apical thinning noted, normal variant. Chamber size mildly enlarged.  TTE 2014: Study Conclusions   - Left ventricle: The cavity size was normal. Wall thickness    was increased in a pattern of moderate LVH. Systolic    function was normal. The estimated ejection fraction was    in the range of 50% to 55%. Wall motion was normal; there    were no regional wall motion abnormalities. Doppler    parameters are consistent with abnormal left ventricular    relaxation (grade 1 diastolic dysfunction).  - Left atrium: The atrium was mildly dilated.    Myoview 2014: Negative for ischemia, normal LVEF  EKG:   08/05/21: NSR with LAFB, IVCD 11/19/20: NSR with LAFB, IVCD.  Recent  Labs: 05/27/2021: ALT 13; BUN 14; Creatinine, Ser 1.24; Hemoglobin 12.9; Platelets 276; Potassium 4.6; Sodium 139  Recent Lipid Panel    Component Value Date/Time   CHOL 147 05/27/2021 1539   TRIG 357 (H) 05/27/2021 1539   TRIG 244 (H) 10/24/2014 1201   HDL 41 05/27/2021 1539   HDL 43 10/24/2014 1201   CHOLHDL 3.6 05/27/2021 1539   CHOLHDL 7.0 (H) 08/31/2016 0800   VLDL 65 (H) 08/31/2016 0800   LDLCALC 52 05/27/2021 1539   LDLCALC 81 01/11/2014 0840   LDLDIRECT 92.8 08/28/2014 1041      Physical Exam:    VS:  BP 136/76   Pulse 63   Ht 6\' 2"  (1.88 m)   Wt 257 lb (116.6 kg)   SpO2 96%   BMI 33.00 kg/m     Wt Readings from Last 3 Encounters:  08/05/21 257 lb (116.6 kg)  05/27/21 252 lb (114.3 kg)  04/25/21 253 lb 8.5 oz (115 kg)     GEN:  Well nourished, well developed in no acute distress HEENT: Normal NECK: No JVD; No carotid bruits CARDIAC: RRR, no murmurs, rubs, gallops RESPIRATORY:  Clear to auscultation without rales, wheezing or rhonchi  ABDOMEN: Soft, non-tender, non-distended MUSCULOSKELETAL:  No edema; No deformity  SKIN: Warm and dry, large right arm ecchymosis NEUROLOGIC:  Alert and oriented x 3 PSYCHIATRIC:  Normal affect   ASSESSMENT:    1. Palpitations   2. Mixed hyperlipidemia   3. Essential hypertension   4. Statin intolerance     PLAN:    In order of problems listed above:  #Palpitations : Resolved with no current symptoms. E-cardio monitor with 0% Afib. TTE with mild LA enlargement. On ASA. -Continue to monitor -Continue ASA 81mg  daily -Continue atenolol 25mg  daily  #HTN:  Well controlled and at goal <120s/80s. -Continue amlodipine 5mg  -Continue atenolol 25mg    #HLD: Intolerant to simva, atorva and crestor. LDL 52 on 05/2021. -Continue pravastatin 10mg  -Continue zetia 10mg  daily -Repeat lipid panel in 6 weeks -Goal LDL <100  #RUE Ecchymosis: Likely secondary to working in the yard. Not on AC. No numbness, tingling, weakness.  Will montior. -Monitor  Medication Adjustments/Labs and Tests Ordered: Current medicines are reviewed at length with the patient today.  Concerns regarding medicines are outlined above.  Orders Placed This Encounter  Procedures  EKG 12-Lead    No orders of the defined types were placed in this encounter.   Patient Instructions  Medication Instructions:   Your physician recommends that you continue on your current medications as directed. Please refer to the Current Medication list given to you today.  *If you need a refill on your cardiac medications before your next appointment, please call your pharmacy*    Follow-Up: At [facility], you and your health needs are our priority.  As part of our continuing mission to provide you with exceptional heart care, we have created designated Provider Care Teams.  These Care Teams include your primary Cardiologist (physician) and Advanced Practice Providers (APPs -  Physician Assistants and Nurse Practitioners) who all work together to provide you with the care you need, when you need it.  We recommend signing up for the patient portal called "MyChart".  Sign up information is provided on this After Visit Summary.  MyChart is used to connect with patients for Virtual Visits (Telemedicine).  Patients are able to view lab/test results, encounter notes, upcoming appointments, etc.  Non-urgent messages can be sent to your provider as well.   To learn more about what you can do with MyChart, go to [email or web address].    Your next appointment:   1 year(s)  The format for your next appointment:   In Person  Provider:    DR. Reece Leader as a scribe for Freada Bergeron, MD.,have documented all relevant documentation on the behalf of Freada Bergeron, MD,as directed by  Freada Bergeron, MD while in the presence of Freada Bergeron, MD.  I, Freada Bergeron, MD, have reviewed all documentation for  this visit. The documentation on 08/05/21 for the exam, diagnosis, procedures, and orders are all accurate and complete.   Signed, Freada Bergeron, MD  08/05/2021 5:33 PM    [place]

## 2021-08-27 DIAGNOSIS — Z8546 Personal history of malignant neoplasm of prostate: Secondary | ICD-10-CM | POA: Diagnosis not present

## 2021-09-03 DIAGNOSIS — Z8546 Personal history of malignant neoplasm of prostate: Secondary | ICD-10-CM | POA: Diagnosis not present

## 2021-09-03 DIAGNOSIS — N5231 Erectile dysfunction following radical prostatectomy: Secondary | ICD-10-CM | POA: Diagnosis not present

## 2021-09-10 ENCOUNTER — Other Ambulatory Visit: Payer: Self-pay

## 2021-09-10 ENCOUNTER — Encounter: Payer: Self-pay | Admitting: Specialist

## 2021-09-10 ENCOUNTER — Ambulatory Visit (INDEPENDENT_AMBULATORY_CARE_PROVIDER_SITE_OTHER): Payer: Medicare Other | Admitting: Specialist

## 2021-09-10 ENCOUNTER — Ambulatory Visit (INDEPENDENT_AMBULATORY_CARE_PROVIDER_SITE_OTHER): Payer: Medicare Other

## 2021-09-10 VITALS — BP 148/83 | HR 60 | Ht 74.0 in | Wt 257.0 lb

## 2021-09-10 DIAGNOSIS — M48062 Spinal stenosis, lumbar region with neurogenic claudication: Secondary | ICD-10-CM

## 2021-09-10 DIAGNOSIS — M4316 Spondylolisthesis, lumbar region: Secondary | ICD-10-CM

## 2021-09-10 DIAGNOSIS — M545 Low back pain, unspecified: Secondary | ICD-10-CM

## 2021-09-10 NOTE — Patient Instructions (Signed)
Avoid bending, stooping and avoid lifting weights greater than 10 lbs. Avoid prolong standing and walking. Order for a new walker with wheels. Surgery scheduling secretary Kandice Hams, will call you in the next week to schedule for surgery.  Surgery recommended is left L3-4 partial hemilaminectomy for lateral recess stenosis and a one level lumbar fusion L4-5 for spondylolisthesis with spinal stenosis, this would be done with rods, screws and cages with local bone graft and allograft (donor bone graft). Take hydrocodone for for pain. Risk of surgery includes risk of infection 1 in 200 patients, bleeding 1/2% chance you would need a transfusion.   Risk to the nerves is one in 10,000. You will need to use a brace for 3 months and wean from the brace on the 4th month. Expect improved walking and standing tolerance. Expect relief of leg pain but numbness may persist depending on the length and degree of pressure that has been present.

## 2021-09-10 NOTE — Progress Notes (Signed)
Office Visit Note   Patient: [name] [name]           Date of Birth: [birth year]           MRN: [id number] Visit Date: 09/10/2021              Requested by: Chevis Pretty, [address] [place],  [place] [id number] PCP: Chevis Pretty, FNP   Assessment & Plan: Visit Diagnoses:  1. Low back pain, unspecified back pain laterality, unspecified chronicity, unspecified whether sciatica present   2. Spondylolisthesis, lumbar region   3. Spinal stenosis of lumbar region with neurogenic claudication   4. Lumbar stenosis with neurogenic claudication     Plan: Follow-Up Instructions: Return in about 4 weeks (around 10/08/2021).   Orders: Avoid bending, stooping and avoid lifting weights greater than 10 lbs. Avoid prolong standing and walking. Order for a new walker with wheels. Surgery scheduling secretary Kandice Hams, will call you in the next week to schedule for surgery.  Surgery recommended is left L3-4 partial hemilaminectomy for lateral recess stenosis and a one level lumbar fusion L4-5 for spondylolisthesis with spinal stenosis, this would be done with rods, screws and cages with local bone graft and allograft (donor bone graft). Take hydrocodone for for pain. Risk of surgery includes risk of infection 1 in 200 patients, bleeding 1/2% chance you would need a transfusion.   Risk to the nerves is one in 10,000. You will need to use a brace for 3 months and wean from the brace on the 4th month. Expect improved walking and standing tolerance. Expect relief of leg pain but numbness may persist depending on the length and degree of pressure that has been present. Orders Placed This Encounter  Procedures   XR Lumbar Spine 2-3 Views   No orders of the defined types were placed in this encounter.     Procedures: No procedures performed   Clinical Data: No additional findings.   Subjective: Chief Complaint  Patient presents with   Lower Back - Pain     HPI  Review of Systems  Constitutional: Negative.   HENT: Negative.    Eyes: Negative.   Respiratory: Negative.    Cardiovascular: Negative.   Gastrointestinal: Negative.   Endocrine: Negative.   Genitourinary: Negative.   Musculoskeletal: Negative.   Skin: Negative.   Allergic/Immunologic: Negative.   Neurological: Negative.   Hematological: Negative.   Psychiatric/Behavioral: Negative.      Objective: Vital Signs: BP (!) 148/83 (BP Location: Left Arm, Patient Position: Sitting)   Pulse 60   Ht 6\' 2"  (1.88 m)   Wt 257 lb (116.6 kg)   BMI 33.00 kg/m   Physical Exam Constitutional:      Appearance: He is well-developed.  HENT:     Head: Normocephalic and atraumatic.  Eyes:     Pupils: Pupils are equal, round, and reactive to light.  Pulmonary:     Effort: Pulmonary effort is normal.     Breath sounds: Normal breath sounds.  Abdominal:     General: Bowel sounds are normal.     Palpations: Abdomen is soft.  Musculoskeletal:     Cervical back: Normal range of motion and neck supple.     Lumbar back: Negative right straight leg raise test and negative left straight leg raise test.  Skin:    General: Skin is warm and dry.  Neurological:     Mental Status: He is alert and oriented to person, place, and time.  Psychiatric:  Behavior: Behavior normal.        Thought Content: Thought content normal.        Judgment: Judgment normal.    Back Exam   Tenderness  The patient is experiencing tenderness in the lumbar.  Range of Motion  Extension:  abnormal  Flexion:  abnormal  Lateral bend right:  abnormal  Lateral bend left:  abnormal  Rotation right:  abnormal  Rotation left:  abnormal   Muscle Strength  Right Quadriceps:  5/5  Left Quadriceps:  5/5  Right Hamstrings:  5/5  Left Hamstrings:  5/5   Tests  Straight leg raise right: negative Straight leg raise left: negative  Other  Toe walk: normal Heel walk: normal Sensation:  decreased Gait: abnormal   Comments:  Bilateral EHL 4/5 weak EDC intact and 5/5 SLR -/- Walking less than 1/4 block Walks without cart and is slower and pushes through it.     Specialty Comments:  No specialty comments available.  Imaging: XR Lumbar Spine 2-3 Views  Result Date: 09/10/2021 AP and Lateral flexion and extension radiographs show spondylolisthesis L4-5 DDD multiple level, previous MRI 07/21/2020 shows moderately severe spinal stenosis L4-5 due to underlying lateral recess narrowing and foramenal narrowing. Left L3-4 lateral recess stenosis.    PMFS History: Patient Active Problem List   Diagnosis Date Noted   Status post total left knee replacement 04/25/2021   Real time reverse transcriptase PCR positive for COVID-19 virus 03/21/2021   Status post total right knee replacement 01/03/2021   Diverticular disease of colon 09/25/2020   Colon cancer screening 09/25/2020   Statin intolerance 12/27/2018   BMI 28.0-28.9,adult 11/01/2018   H/O arthroscopy of shoulder 02/10/2018   Primary osteoarthritis of left knee 11/25/2016   Primary osteoarthritis of right knee 11/25/2016   Essential hypertension 06/28/2015   H/O prostate cancer 06/28/2015   Hereditary and idiopathic peripheral neuropathy 04/17/2015   Carpal tunnel syndrome 04/17/2015   Hyperlipemia 06/02/2013   Asthma, chronic 06/02/2013   Depression 06/02/2013   GERD 06/04/2009   LEG PAIN, BILATERAL 06/04/2009   PERCUTANEOUS TRANSLUMINAL CORONARY ANGIOPLASTY, HX OF 06/04/2009   Past Medical History:  Diagnosis Date   Arthritis    Asthma, chronic 06/02/2013   Carpal tunnel syndrome 04/17/2015   Bilateral   Depression 06/02/2013   DYSPNEA ON EXERTION 06/04/2009   Qualifier: Diagnosis of  By: Domenic Polite, MD, Phillips Hay    Hereditary and idiopathic peripheral neuropathy 04/17/2015   Hyperlipemia 06/02/2013   Hypertension    LEG PAIN, BILATERAL 06/04/2009   Qualifier: Diagnosis of  By: Domenic Polite,  MD, FACC, [place], HX OF 06/04/2009   Qualifier: Diagnosis of  By: Domenic Polite, MD, Phillips Hay    Prostate CA [facility])    and basal cell nose    Family History  Problem Relation Age of Onset   Heart disease Other        Early Cardiac disease for uncle is their 44's as well as deceased.   Heart failure Mother    Dementia Mother 65   Cancer Father 37       testicular, lung   Esophageal cancer Brother     Past Surgical History:  Procedure Laterality Date   CARDIAC CATHETERIZATION  1997   EXCISION MASS NECK Left 11/12/2020   Procedure: EXCISION LEFT POSTERIOR NECK MASS;  Surgeon: Coralie Keens, MD;  Location: [place];  Service: General;  Laterality: Left;   EYE SURGERY Bilateral 2020  cataracts removed   HERNIA REPAIR     HIATAL HERNIA REPAIR     LAPAROSCOPIC GASTRIC BANDING  1572   [facility] fundoplication   MENISCUS REPAIR Left 2012   ROBOT ASSISTED LAPAROSCOPIC RADICAL PROSTATECTOMY     ROTATOR CUFF REPAIR Right 2019   TOTAL KNEE ARTHROPLASTY Right 01/03/2021   Procedure: RIGHT TOTAL KNEE ARTHROPLASTY;  Surgeon: Mcarthur Rossetti, MD;  [facility];  Service: Orthopedics;  Laterality: Right;  Needs RNFA   TOTAL KNEE ARTHROPLASTY Left 04/25/2021   Procedure: LEFT TOTAL KNEE ARTHROPLASTY;  Surgeon: Mcarthur Rossetti, MD;  [facility];  Service: Orthopedics;  Laterality: Left;   Social History   Occupational History   Occupation: Retired    Comment: Nature conservation officer  Tobacco Use   Smoking status: Former    Packs/day: 1.00    Years: 20.00    Pack years: 20.00    Types: Cigarettes    Quit date: 01/18/1993    Years since quitting: 28.6   Smokeless tobacco: Never  Vaping Use   Vaping Use: Never used  Substance and Sexual Activity   Alcohol use: Yes    Alcohol/week: 2.0 standard drinks    Types: 2 Cans of beer per week    Comment: occasional   Drug use: No   Sexual activity: Yes

## 2021-09-11 ENCOUNTER — Other Ambulatory Visit: Payer: Self-pay | Admitting: Nurse Practitioner

## 2021-09-11 DIAGNOSIS — F3342 Major depressive disorder, recurrent, in full remission: Secondary | ICD-10-CM

## 2021-09-17 ENCOUNTER — Ambulatory Visit: Payer: Medicare Other | Admitting: Specialist

## 2021-09-21 ENCOUNTER — Other Ambulatory Visit: Payer: Self-pay | Admitting: Nurse Practitioner

## 2021-09-21 DIAGNOSIS — I10 Essential (primary) hypertension: Secondary | ICD-10-CM

## 2021-09-25 ENCOUNTER — Telehealth: Payer: Self-pay | Admitting: Specialist

## 2021-09-25 NOTE — Telephone Encounter (Signed)
Pt's wife [name] called requesting Dr. Louanne Skye send a referral for pt to have an MRI. Please call at [phone].

## 2021-10-01 ENCOUNTER — Encounter: Payer: Self-pay | Admitting: Specialist

## 2021-10-01 ENCOUNTER — Other Ambulatory Visit: Payer: Self-pay | Admitting: Radiology

## 2021-10-01 DIAGNOSIS — M4316 Spondylolisthesis, lumbar region: Secondary | ICD-10-CM

## 2021-10-01 DIAGNOSIS — M545 Low back pain, unspecified: Secondary | ICD-10-CM

## 2021-10-01 DIAGNOSIS — M48062 Spinal stenosis, lumbar region with neurogenic claudication: Secondary | ICD-10-CM

## 2021-10-07 DIAGNOSIS — Z85828 Personal history of other malignant neoplasm of skin: Secondary | ICD-10-CM | POA: Diagnosis not present

## 2021-10-07 DIAGNOSIS — L814 Other melanin hyperpigmentation: Secondary | ICD-10-CM | POA: Diagnosis not present

## 2021-10-07 DIAGNOSIS — L821 Other seborrheic keratosis: Secondary | ICD-10-CM | POA: Diagnosis not present

## 2021-10-07 DIAGNOSIS — L820 Inflamed seborrheic keratosis: Secondary | ICD-10-CM | POA: Diagnosis not present

## 2021-10-07 DIAGNOSIS — L579 Skin changes due to chronic exposure to nonionizing radiation, unspecified: Secondary | ICD-10-CM | POA: Diagnosis not present

## 2021-10-07 DIAGNOSIS — D235 Other benign neoplasm of skin of trunk: Secondary | ICD-10-CM | POA: Diagnosis not present

## 2021-10-07 DIAGNOSIS — D2261 Melanocytic nevi of right upper limb, including shoulder: Secondary | ICD-10-CM | POA: Diagnosis not present

## 2021-10-11 ENCOUNTER — Other Ambulatory Visit: Payer: Self-pay | Admitting: Nurse Practitioner

## 2021-10-11 DIAGNOSIS — J4520 Mild intermittent asthma, uncomplicated: Secondary | ICD-10-CM

## 2021-10-15 ENCOUNTER — Telehealth: Payer: Self-pay | Admitting: Specialist

## 2021-10-15 NOTE — Telephone Encounter (Signed)
LMOM to pt to return to sch MRI [facility] review with Dr. Louanne Skye, after 10/28/21

## 2021-10-17 ENCOUNTER — Other Ambulatory Visit: Payer: Self-pay | Admitting: Nurse Practitioner

## 2021-10-20 ENCOUNTER — Other Ambulatory Visit: Payer: Self-pay | Admitting: Nurse Practitioner

## 2021-10-20 MED ORDER — NAPROXEN 500 MG PO TABS: 500.0000 mg | ORAL_TABLET | Freq: Every day | ORAL | 0 refills | Status: DC

## 2021-10-27 ENCOUNTER — Ambulatory Visit (INDEPENDENT_AMBULATORY_CARE_PROVIDER_SITE_OTHER): Payer: Medicare Other

## 2021-10-27 VITALS — Wt 253.0 lb

## 2021-10-27 DIAGNOSIS — Z0000 Encounter for general adult medical examination without abnormal findings: Secondary | ICD-10-CM

## 2021-10-27 NOTE — Patient Instructions (Signed)
[name] [name] , Thank you for taking time to come for your Medicare Wellness Visit. I appreciate your ongoing commitment to your health goals. Please review the following plan we discussed and let me know if I can assist you in the future.   Screening recommendations/referrals: Colonoscopy: Done 12/07/2019 - may repeat in 10 years Recommended yearly ophthalmology/optometry visit for glaucoma screening and checkup Recommended yearly dental visit for hygiene and checkup  Vaccinations: Influenza vaccine: Done 06/18/2021 - Repeat annually  Pneumococcal vaccine: Done 10/27/2011 & 06/28/2015 Tdap vaccine: Done 07/07/2016 - Repeat in 10 years Shingles vaccine: Done 12/09/2017 & 02/16/2018   Covid-19: Done 10/20/2019, 11/18/2019, 07/11/2020, 01/12/2021, & 07/15/2021  Advanced directives: Please bring a copy of your health care power of attorney and living will to the office to be added to your chart at your convenience.   Conditions/risks identified: Aim for 30 minutes of exercise or brisk walking each day, drink 6-8 glasses of water and eat lots of fruits and vegetables.  Next appointment: Follow up in one year for your annual wellness visit.   Preventive Care 1 Years and Older, Male  Preventive care refers to lifestyle choices and visits with your health care provider that can promote health and wellness. What does preventive care include? A yearly physical exam. This is also called an annual well check. Dental exams once or twice a year. Routine eye exams. Ask your health care provider how often you should have your eyes checked. Personal lifestyle choices, including: Daily care of your teeth and gums. Regular physical activity. Eating a healthy diet. Avoiding tobacco and drug use. Limiting alcohol use. Practicing safe sex. Taking low doses of aspirin every day. Taking vitamin and mineral supplements as recommended by your health care provider. What happens during an annual well check? The services  and screenings done by your health care provider during your annual well check will depend on your age, overall health, lifestyle risk factors, and family history of disease. Counseling  Your health care provider may ask you questions about your: Alcohol use. Tobacco use. Drug use. Emotional well-being. Home and relationship well-being. Sexual activity. Eating habits. History of falls. Memory and ability to understand (cognition). Work and work Statistician. Screening  You may have the following tests or measurements: Height, weight, and BMI. Blood pressure. Lipid and cholesterol levels. These may be checked every 5 years, or more frequently if you are over 50 years old. Skin check. Lung cancer screening. You may have this screening every year starting at age 25 if you have a 30-pack-year history of smoking and currently smoke or have quit within the past 15 years. Fecal occult blood test (FOBT) of the stool. You may have this test every year starting at age 63. Flexible sigmoidoscopy or colonoscopy. You may have a sigmoidoscopy every 5 years or a colonoscopy every 10 years starting at age 5. Prostate cancer screening. Recommendations will vary depending on your family history and other risks. Hepatitis C blood test. Hepatitis B blood test. Sexually transmitted disease (STD) testing. Diabetes screening. This is done by checking your blood sugar (glucose) after you have not eaten for a while (fasting). You may have this done every 1-3 years. Abdominal aortic aneurysm (AAA) screening. You may need this if you are a current or former smoker. Osteoporosis. You may be screened starting at age 93 if you are at high risk. Talk with your health care provider about your test results, treatment options, and if necessary, the need for more tests. Vaccines  Your health care provider may recommend certain vaccines, such as: Influenza vaccine. This is recommended every year. Tetanus, diphtheria,  and acellular pertussis (Tdap, Td) vaccine. You may need a Td booster every 10 years. Zoster vaccine. You may need this after age 75. Pneumococcal 13-valent conjugate (PCV13) vaccine. One dose is recommended after age 47. Pneumococcal polysaccharide (PPSV23) vaccine. One dose is recommended after age 53. Talk to your health care provider about which screenings and vaccines you need and how often you need them. This information is not intended to replace advice given to you by your health care provider. Make sure you discuss any questions you have with your health care provider. Document Released: 09/27/2015 Document Revised: 05/20/2016 Document Reviewed: 07/02/2015 Elsevier Interactive Patient Education  2017 [place] Prevention in the Home Falls can cause injuries. They can happen to people of all ages. There are many things you can do to make your home safe and to help prevent falls. What can I do on the outside of my home? Regularly fix the edges of walkways and driveways and fix any cracks. Remove anything that might make you trip as you walk through a door, such as a raised step or threshold. Trim any bushes or trees on the path to your home. Use bright outdoor lighting. Clear any walking paths of anything that might make someone trip, such as rocks or tools. Regularly check to see if handrails are loose or broken. Make sure that both sides of any steps have handrails. Any raised decks and porches should have guardrails on the edges. Have any leaves, snow, or ice cleared regularly. Use sand or salt on walking paths during winter. Clean up any spills in your garage right away. This includes oil or grease spills. What can I do in the bathroom? Use night lights. Install grab bars by the toilet and in the tub and shower. Do not use towel bars as grab bars. Use non-skid mats or decals in the tub or shower. If you need to sit down in the shower, use a plastic, non-slip  stool. Keep the floor dry. Clean up any water that spills on the floor as soon as it happens. Remove soap buildup in the tub or shower regularly. Attach bath mats securely with double-sided non-slip rug tape. Do not have throw rugs and other things on the floor that can make you trip. What can I do in the bedroom? Use night lights. Make sure that you have a light by your bed that is easy to reach. Do not use any sheets or blankets that are too big for your bed. They should not hang down onto the floor. Have a firm chair that has side arms. You can use this for support while you get dressed. Do not have throw rugs and other things on the floor that can make you trip. What can I do in the kitchen? Clean up any spills right away. Avoid walking on wet floors. Keep items that you use a lot in easy-to-reach places. If you need to reach something above you, use a strong step stool that has a grab bar. Keep electrical cords out of the way. Do not use floor polish or wax that makes floors slippery. If you must use wax, use non-skid floor wax. Do not have throw rugs and other things on the floor that can make you trip. What can I do with my stairs? Do not leave any items on the stairs. Make sure that there are  handrails on both sides of the stairs and use them. Fix handrails that are broken or loose. Make sure that handrails are as long as the stairways. Check any carpeting to make sure that it is firmly attached to the stairs. Fix any carpet that is loose or worn. Avoid having throw rugs at the top or bottom of the stairs. If you do have throw rugs, attach them to the floor with carpet tape. Make sure that you have a light switch at the top of the stairs and the bottom of the stairs. If you do not have them, ask someone to add them for you. What else can I do to help prevent falls? Wear shoes that: Do not have high heels. Have rubber bottoms. Are comfortable and fit you well. Are closed at the  toe. Do not wear sandals. If you use a stepladder: Make sure that it is fully opened. Do not climb a closed stepladder. Make sure that both sides of the stepladder are locked into place. Ask someone to hold it for you, if possible. Clearly mark and make sure that you can see: Any grab bars or handrails. First and last steps. Where the edge of each step is. Use tools that help you move around (mobility aids) if they are needed. These include: Canes. Walkers. Scooters. Crutches. Turn on the lights when you go into a dark area. Replace any light bulbs as soon as they burn out. Set up your furniture so you have a clear path. Avoid moving your furniture around. If any of your floors are uneven, fix them. If there are any pets around you, be aware of where they are. Review your medicines with your doctor. Some medicines can make you feel dizzy. This can increase your chance of falling. Ask your doctor what other things that you can do to help prevent falls. This information is not intended to replace advice given to you by your health care provider. Make sure you discuss any questions you have with your health care provider. Document Released: 06/27/2009 Document Revised: 02/06/2016 Document Reviewed: 10/05/2014 Elsevier Interactive Patient Education  2017 [employer].

## 2021-10-27 NOTE — Progress Notes (Signed)
Subjective:   [name] [name] is a 76 y.o. male who presents for Medicare Annual/Subsequent preventive examination.  Virtual Visit via Telephone Note  I connected with  MEAGAN SPEASE on 10/27/21 at  1:15 PM EST by telephone and verified that I am speaking with the correct person using two identifiers.  Location: Patient: Home Provider: [facility] Persons participating in the virtual visit: patient/Nurse Health Advisor   I discussed the limitations, risks, security and privacy concerns of performing an evaluation and management service by telephone and the availability of in person appointments. The patient expressed understanding and agreed to proceed.  Interactive audio and video telecommunications were attempted between this nurse and patient, however failed, due to patient having technical difficulties OR patient did not have access to video capability.  We continued and completed visit with audio only.  Some vital signs may be absent or patient reported.   Liliana Dang E Quintana Canelo, LPN   Review of Systems     Cardiac Risk Factors include: advanced age (>71men, >70 women);male gender;obesity (BMI >30kg/m2);sedentary lifestyle;dyslipidemia;hypertension     Objective:    Today's Vitals   10/27/21 1311  Weight: 253 lb (114.8 kg)  PainSc: 5    Body mass index is 32.48 kg/m.  Advanced Directives 10/27/2021 04/25/2021 04/18/2021 01/17/2021 01/03/2021 01/03/2021 12/25/2020  Does Patient Have a Medical Advance Directive? Yes Yes No;Yes Yes Yes Yes Yes  Type of Paramedic of [place];Living will [place];Living will [place];Living will - [place];Living will - [place];Living will  Does patient want to make changes to medical advance directive? - No - Guardian declined - - No - Patient declined No - Patient declined No - Patient declined  Copy of [place] in Chart? No - copy  requested No - copy requested - - Yes - validated most recent copy scanned in chart (See row information) No - copy requested No - copy requested  Would patient like information on creating a medical advance directive? - - - - - - -    Current Medications (verified) Outpatient Encounter Medications as of 10/27/2021  Medication Sig   amLODipine (NORVASC) 5 MG tablet TAKE 1 TABLET BY MOUTH EVERY DAY   aspirin 81 MG chewable tablet Chew 1 tablet (81 mg total) by mouth 2 (two) times daily.   atenolol (TENORMIN) 25 MG tablet Take 1 tablet (25 mg total) by mouth daily.   budesonide-formoterol (SYMBICORT) 160-4.5 MCG/ACT inhaler Inhale 2 puffs into the lungs 2 (two) times daily.   Cholecalciferol (VITAMIN D3) 25 MCG (1000 UT) CAPS Take 1,000 Units by mouth daily.   citalopram (CELEXA) 20 MG tablet TAKE 1 TABLET BY MOUTH EVERY DAY   cyanocobalamin (,VITAMIN B-12,) 1000 MCG/ML injection Inject 1 mL (1,000 mcg total) into the muscle every 30 (thirty) days.   ezetimibe (ZETIA) 10 MG tablet Take 1 tablet (10 mg total) by mouth daily.   loratadine (CLARITIN) 10 MG tablet Take 10 mg by mouth daily as needed for allergies.   montelukast (SINGULAIR) 10 MG tablet TAKE 1 TABLET BY MOUTH DAILY   naproxen (NAPROSYN) 500 MG tablet Take 1 tablet (500 mg total) by mouth daily.   pravastatin (PRAVACHOL) 20 MG tablet Take 0.5 tablets (10 mg total) by mouth daily.   sildenafil (REVATIO) 20 MG tablet Take 20 mg by mouth as needed.   methocarbamol (ROBAXIN) 500 MG tablet Take 1 tablet (500 mg total) by mouth every 6 (  six) hours as needed for muscle spasms. (Patient not taking: Reported on 10/27/2021)   oxyCODONE (OXY IR/ROXICODONE) 5 MG immediate release tablet Take 1-2 tablets (5-10 mg total) by mouth every 4 (four) hours as needed for moderate pain (pain score 4-6). (Patient not taking: Reported on 10/27/2021)   No facility-administered encounter medications on file as of 10/27/2021.    Allergies (verified) Fenofibrate  and Rosuvastatin   History: Past Medical History:  Diagnosis Date   Arthritis    Asthma, chronic 06/02/2013   Carpal tunnel syndrome 04/17/2015   Bilateral   Depression 06/02/2013   DYSPNEA ON EXERTION 06/04/2009   Qualifier: Diagnosis of  By: Domenic Polite, MD, Phillips Hay    Hereditary and idiopathic peripheral neuropathy 04/17/2015   Hyperlipemia 06/02/2013   Hypertension    LEG PAIN, BILATERAL 06/04/2009   Qualifier: Diagnosis of  By: Domenic Polite, MD, FACC, [place], HX OF 06/04/2009   Qualifier: Diagnosis of  By: Domenic Polite, MD, Phillips Hay    Prostate CA [facility])    and basal cell nose   Past Surgical History:  Procedure Laterality Date   CARDIAC CATHETERIZATION  1997   EXCISION MASS NECK Left 11/12/2020   Procedure: EXCISION LEFT POSTERIOR NECK MASS;  Surgeon: Coralie Keens, MD;  Location: [place];  Service: General;  Laterality: Left;   EYE SURGERY Bilateral 2020   cataracts removed   [place] GASTRIC BANDING  6073   [facility] fundoplication   MENISCUS REPAIR Left 2012   ROBOT ASSISTED LAPAROSCOPIC RADICAL PROSTATECTOMY     ROTATOR CUFF REPAIR Right 2019   TOTAL KNEE ARTHROPLASTY Right 01/03/2021   Procedure: RIGHT TOTAL KNEE ARTHROPLASTY;  Surgeon: Mcarthur Rossetti, MD;  [facility];  Service: Orthopedics;  Laterality: Right;  Needs RNFA   TOTAL KNEE ARTHROPLASTY Left 04/25/2021   Procedure: LEFT TOTAL KNEE ARTHROPLASTY;  Surgeon: Mcarthur Rossetti, MD;  [facility];  Service: Orthopedics;  Laterality: Left;   Family History  Problem Relation Age of Onset   Heart disease Other        Early Cardiac disease for uncle is their 51's as well as deceased.   Heart failure Mother    Dementia Mother 44   Cancer Father 49       testicular, lung   Esophageal cancer Brother    Social History   Socioeconomic History    Marital status: Married    Spouse name: [name]   Number of children: 3   Years of education: 18   Highest education level: Master's degree (e.g., MA, MS, MEng, MEd, MSW, MBA)  Occupational History   Occupation: Retired    Comment: Nature conservation officer  Tobacco Use   Smoking status: Former    Packs/day: 1.00    Years: 20.00    Pack years: 20.00    Types: Cigarettes    Quit date: 01/18/1993    Years since quitting: 28.7   Smokeless tobacco: Never  Vaping Use   Vaping Use: Never used  Substance and Sexual Activity   Alcohol use: Yes    Alcohol/week: 2.0 standard drinks    Types: 2 Cans of beer per week    Comment: occasional   Drug use: No   Sexual activity: Yes  Other Topics Concern   Not on file  Social History Narrative   Patient is married and lives at home with is wife.  He has three adult children and five grandchildren.    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    Social Determinants of Health   Financial Resource Strain: Low Risk    Difficulty of Paying Living Expenses: Not hard at all  Food Insecurity: No Food Insecurity   Worried About Charity fundraiser in the Last Year: Never true   [place] in the Last Year: Never true  Transportation Needs: No Transportation Needs   Lack of Transportation (Medical): No   Lack of Transportation (Non-Medical): No  Physical Activity: Sufficiently Active   Days of Exercise per Week: 7 days   Minutes of Exercise per Session: 30 min  Stress: No Stress Concern Present   Feeling of Stress : Not at all  Social Connections: Moderately Integrated   Frequency of Communication with Friends and Family: Not on file   Frequency of Social Gatherings with Friends and Family: More than three times a week   Attends Religious Services: Never   Marine scientist or Organizations: Yes   Attends Music therapist: More than 4 times per year   Marital Status: Married    Tobacco  Counseling Counseling given: Not Answered   Clinical Intake:  Pre-visit preparation completed: Yes  Pain : 0-10 Pain Score: 5  Pain Type: Chronic pain Pain Location: Back Pain Orientation: Left Pain Descriptors / Indicators: Aching, Sore, Sharp Pain Onset: More than a month ago Pain Frequency: Intermittent     BMI - recorded: 32.48 Nutritional Status: BMI > 30  Obese Nutritional Risks: None Diabetes: No  How often do you need to have someone help you when you read instructions, pamphlets, or other written materials from your doctor or pharmacy?: 1 - Never  Diabetic? no  Interpreter Needed?: No  Information entered by :: Ardice Boyan, LPN   Activities of Daily Living In your present state of health, do you have any difficulty performing the following activities: 10/27/2021 04/25/2021  Hearing? N N  Vision? N N  Difficulty concentrating or making decisions? N N  Walking or climbing stairs? N N  Dressing or bathing? N N  Doing errands, shopping? N N  Preparing Food and eating ? N -  Using the Toilet? N -  In the past six months, have you accidently leaked urine? N -  Do you have problems with loss of bowel control? N -  Managing your Medications? N -  Managing your Finances? N -  Housekeeping or managing your Housekeeping? N -  Some recent data might be hidden    Patient Care Team: Chevis Pretty, FNP as PCP - General (Family Medicine) Magnus Sinning, MD as Consulting Physician Magnus Sinning, MD as Consulting Physician Mcarthur Rossetti, MD as Consulting Physician (Orthopedic Surgery) Harlen Labs, MD as Referring Physician (Optometry) Jessy Oto, MD as Consulting Physician (Orthopedic Surgery) Geri Seminole, MD as Referring Physician (Dermatopathology)  Indicate any recent Medical Services you may have received from other than [facility] providers in the past year (date may be approximate).     Assessment:   This is a routine wellness  examination for [place].  Hearing/Vision screen Hearing Screening - Comments:: Denies hearing difficulties   Vision Screening - Comments:: Wears reading glasses prn - up to date with routine eye exams  Dietary issues and exercise activities discussed: Current Exercise Habits: Home exercise routine, Type of exercise: walking;stretching, Time (Minutes): 30, Frequency (Times/Week): 7, Weekly  Exercise (Minutes/Week): 210, Intensity: Mild, Exercise limited by: orthopedic condition(s);respiratory conditions(s)   Goals Addressed             This Visit's Progress    DIET - INCREASE WATER INTAKE   On track    Try to drink 6-8 glasses of water daily     Exercise 3x per week (30 min per time)   On track    Continue to walk 30 min daily       Depression Screen PHQ 2/9 Scores 10/27/2021 05/27/2021 10/24/2020 10/10/2020 10/10/2020 04/09/2020 10/23/2019  PHQ - 2 Score 0 0 0 0 0 0 0  PHQ- 9 Score - 3 - 1 - - -    Fall Risk Fall Risk  10/27/2021 05/27/2021 10/24/2020 10/10/2020 04/09/2020  Falls in the past year? 0 0 0 0 0  Number falls in past yr: 0 - - - -  Injury with Fall? 0 - - - -  Risk for fall due to : Orthopedic patient - - - -  Follow up Falls prevention discussed - Falls evaluation completed - -    FALL RISK PREVENTION PERTAINING TO THE HOME:  Any stairs in or around the home? Yes  If so, are there any without handrails? Yes  - only 4 inches high and only a few Home free of loose throw rugs in walkways, pet beds, electrical cords, etc? Yes  Adequate lighting in your home to reduce risk of falls? Yes   ASSISTIVE DEVICES UTILIZED TO PREVENT FALLS:  Life alert? No  Use of a cane, walker or w/c? No  Grab bars in the bathroom? Yes  Shower chair or bench in shower? No  Elevated toilet seat or a handicapped toilet? Yes   TIMED UP AND GO:  Was the test performed? No . Telephonic visit  Cognitive Function: Normal cognitive status assessed by direct observation by this Nurse Health  Advisor. No abnormalities found.   MMSE - Mini Mental State Exam 10/20/2018 10/18/2017 07/07/2016  Orientation to time 5 5 5   Orientation to Place 5 5 5   Registration 3 3 3   Attention/ Calculation 5 5 5   Recall 3 3 3   Language- name 2 objects 2 2 2   Language- repeat 1 1 1   Language- follow 3 step command 3 3 3   Language- read & follow direction 1 1 1   Write a sentence 1 1 1   Copy design 1 0 1  Total score 30 29 30      6CIT Screen 10/24/2020 10/23/2019  What Year? 0 points 0 points  What month? 0 points 0 points  What time? 0 points 0 points  Count back from 20 0 points 0 points  Months in reverse 0 points 0 points  Repeat phrase 0 points 0 points  Total Score 0 0    Immunizations Immunization History  Administered Date(s) Administered   Fluad Quad(high Dose 65+) 06/21/2019, 07/02/2020, 06/18/2021   Influenza, High Dose Seasonal PF 07/07/2016, 06/15/2017, 07/14/2018   Influenza,inj,Quad PF,6+ Mos 06/27/2013, 06/25/2014, 06/28/2015   Moderna Covid-19 Vaccine Bivalent Booster 28yrs & up 07/15/2021   Moderna SARS-COV2 Booster Vaccination 07/11/2020, 01/12/2021   Moderna Sars-Covid-2 Vaccination 10/20/2019, 11/18/2019   Pneumococcal Conjugate-13 06/28/2015   Pneumococcal Polysaccharide-23 10/27/2011   Tdap 07/07/2016   Zoster Recombinat (Shingrix) 12/09/2017, 02/16/2018    TDAP status: Up to date  Flu Vaccine status: Up to date  Pneumococcal vaccine status: Up to date  Covid-19 vaccine status: Completed vaccines  Qualifies for Shingles Vaccine? Yes  Zostavax completed Yes   Shingrix Completed?: Yes  Screening Tests Health Maintenance  Topic Date Due   TETANUS/TDAP  07/07/2026   COLONOSCOPY (Pts 45-29yrs Insurance coverage will need to be confirmed)  12/06/2029   Pneumonia Vaccine 62+ Years old  Completed   INFLUENZA VACCINE  Completed   COVID-19 Vaccine  Completed   Hepatitis C Screening  Completed   Zoster Vaccines- Shingrix  Completed   HPV VACCINES  Aged Out     Health Maintenance  There are no preventive care reminders to display for this patient.   Colorectal cancer screening: Type of screening: Colonoscopy. Completed 12/07/2019. Repeat every 10 years  Lung Cancer Screening: (Low Dose CT Chest recommended if Age 49-80 years, 30 pack-year currently smoking OR have quit w/in 15years.) does not qualify.   Additional Screening:  Hepatitis C Screening: does qualify; Completed 04/18/2018  Vision Screening: Recommended annual ophthalmology exams for early detection of glaucoma and other disorders of the eye. Is the patient up to date with their annual eye exam?  Yes  Who is the provider or what is the name of the office in which the patient attends annual eye exams? Forgot name of ophthalmologist If pt is not established with a provider, would they like to be referred to a provider to establish care? No .   Dental Screening: Recommended annual dental exams for proper oral hygiene  Community Resource Referral / Chronic Care Management: CRR required this visit?  No   CCM required this visit?  No      Plan:     I have personally reviewed and noted the following in the patients chart:   Medical and social history Use of alcohol, tobacco or illicit drugs  Current medications and supplements including opioid prescriptions. Patient is not currently taking opioid prescriptions. Functional ability and status Nutritional status Physical activity Advanced directives List of other physicians Hospitalizations, surgeries, and ER visits in previous 12 months Vitals Screenings to include cognitive, depression, and falls Referrals and appointments  In addition, I have reviewed and discussed with patient certain preventive protocols, quality metrics, and best practice recommendations. A written personalized care plan for preventive services as well as general preventive health recommendations were provided to patient.     Sandrea Hammond,  LPN   [id number]   Nurse Notes: None

## 2021-10-28 ENCOUNTER — Ambulatory Visit
Admission: RE | Admit: 2021-10-28 | Discharge: 2021-10-28 | Disposition: A | Discharge status: Home / Self Care | Payer: Medicare Other | Source: Ambulatory Visit | Attending: Specialist | Admitting: Specialist

## 2021-10-28 DIAGNOSIS — M48061 Spinal stenosis, lumbar region without neurogenic claudication: Secondary | ICD-10-CM | POA: Diagnosis not present

## 2021-10-28 DIAGNOSIS — M5126 Other intervertebral disc displacement, lumbar region: Secondary | ICD-10-CM | POA: Diagnosis not present

## 2021-10-28 DIAGNOSIS — M4316 Spondylolisthesis, lumbar region: Secondary | ICD-10-CM

## 2021-10-28 DIAGNOSIS — M4807 Spinal stenosis, lumbosacral region: Secondary | ICD-10-CM | POA: Diagnosis not present

## 2021-10-28 DIAGNOSIS — M48062 Spinal stenosis, lumbar region with neurogenic claudication: Secondary | ICD-10-CM

## 2021-10-28 DIAGNOSIS — M545 Low back pain, unspecified: Secondary | ICD-10-CM

## 2021-10-28 MED ORDER — GADOBENATE DIMEGLUMINE 529 MG/ML IV SOLN
20.0000 mL | Freq: Once | INTRAVENOUS | Status: AC | PRN
  Administered 2021-10-28: 20 mL via INTRAVENOUS

## 2021-10-30 ENCOUNTER — Other Ambulatory Visit: Payer: Self-pay

## 2021-10-30 ENCOUNTER — Ambulatory Visit (INDEPENDENT_AMBULATORY_CARE_PROVIDER_SITE_OTHER): Payer: Medicare Other | Admitting: Specialist

## 2021-10-30 ENCOUNTER — Encounter: Payer: Self-pay | Admitting: Specialist

## 2021-10-30 VITALS — BP 136/72 | HR 65 | Ht 74.0 in | Wt 255.0 lb

## 2021-10-30 DIAGNOSIS — M4316 Spondylolisthesis, lumbar region: Secondary | ICD-10-CM | POA: Diagnosis not present

## 2021-10-30 DIAGNOSIS — M48062 Spinal stenosis, lumbar region with neurogenic claudication: Secondary | ICD-10-CM

## 2021-10-30 DIAGNOSIS — M545 Low back pain, unspecified: Secondary | ICD-10-CM

## 2021-10-30 NOTE — Progress Notes (Signed)
Office Visit Note   Patient: [name] [name]           Date of Birth: [birth year]           MRN: [id number] Visit Date: 10/30/2021              Requested by: Chevis Pretty, [address] [place],  [place] [address] PCP: Chevis Pretty, FNP   Assessment & Plan: Visit Diagnoses:  1. Spondylolisthesis, lumbar region   2. Lumbar stenosis with neurogenic claudication   3. Low back pain, unspecified back pain laterality, unspecified chronicity, unspecified whether sciatica present   Functioning at a high level so that the risk of surgery still outweights any benefit. Reorder ESIs f/u in 4-6 months. Plan: Avoid bending, stooping and avoid lifting weights greater than 10 lbs. Avoid prolong standing and walking. Avoid frequent bending and stooping  No lifting greater than 10 lbs. May use ice or moist heat for pain. Weight loss is of benefit. Handicap license is approved. Dr. Romona Curls secretary/Assistant will call to arrange for epidural steroid injection   Walking one or miles at a time and is functioning at a high level  Follow-Up Instructions: No follow-ups on file.   Orders:  No orders of the defined types were placed in this encounter.  No orders of the defined types were placed in this encounter.     Procedures: No procedures performed   Clinical Data: Findings:  CLINICAL DATA:  Chronic low back pain with bilateral leg pain for 10 years  EXAM: MRI LUMBAR SPINE WITHOUT AND WITH CONTRAST  TECHNIQUE: Multiplanar and multiecho pulse sequences of the lumbar spine were obtained without and with intravenous contrast.  CONTRAST:  49mL MULTIHANCE GADOBENATE DIMEGLUMINE 529 MG/ML IV SOLN  COMPARISON:  07/21/2020  FINDINGS: Segmentation:  5 lumbar type vertebrae based on prior  Alignment:  Grade 1 anterolisthesis at L4-5, mildly progressed.  Vertebrae:  No fracture, evidence of discitis, or bone lesion.  Conus medullaris and cauda equina:  Conus extends to the T12-L1 level. Conus and cauda equina appear normal.  Paraspinal and other soft tissues: Lower pole renal cyst on the left.  Disc levels:  T12- L1: Spondylosis with ventral spur that is likely bridging  L1-L2: Fairly bulky ventral spondylitic spur.  No impingement  L2-L3: Ventral spondylitic spurring.  Mild disc bulging.  L3-L4: Disc narrowing and bulging with ventral spurring. Bilateral facet spurring and ligamentum flavum thickening. Anterior and medial synovial cyst from the left facet measuring 5 mm. No neural compression.  L4-L5: Advanced facet degeneration with spurring and anterolisthesis. Bilobed anterior synovial cyst measuring 14 mm with compression of the right L4 nerve root. Patent left foramen. Moderate spinal stenosis  L5-S1:Facet osteoarthritis with asymmetric bulky spurring. Eccentric right disc bulging and far-lateral spurring. Moderate right foraminal stenosis.  IMPRESSION: 1. Generalized lumbar spine degeneration with progression from 2021. 2. L4-5 severe facet osteoarthritis with anterolisthesis and 14 mm right synovial cyst that compresses the right L4 nerve root. Moderate spinal stenosis at this level. 3. L3-4 left facet synovial cyst which is new but noncompressive.   Electronically Signed   By: Jorje Guild M.D.   On: 10/28/2021 19:27     Subjective: Chief Complaint  Patient presents with   Lower Back - Pain    76 year old male with history of back pain and leg pain some days worse than others. Left leg is greater than right. He is limited on some days to short distance walking but  at other times he is able to walk without much trouble. Pain is fairly constant. No bowel or bladder difficulty. Takes a naproxen tablet daily and a baby aspirin for CAD post stent. Can walk a mile.    Review of Systems  Constitutional: Negative.   HENT: Negative.    Eyes: Negative.   Respiratory: Negative.     Cardiovascular: Negative.   Gastrointestinal: Negative.   Endocrine: Negative.   Genitourinary: Negative.   Musculoskeletal: Negative.   Skin: Negative.   Allergic/Immunologic: Negative.   Neurological: Negative.   Hematological: Negative.   Psychiatric/Behavioral: Negative.      Objective: Vital Signs: BP 136/72 (BP Location: Left Arm, Patient Position: Sitting, Cuff Size: Large)    Pulse 65    Ht 6\' 2"  (1.88 m)    Wt 255 lb (115.7 kg)    BMI 32.74 kg/m   Physical Exam Constitutional:      Appearance: He is well-developed.  HENT:     Head: Normocephalic and atraumatic.  Eyes:     Pupils: Pupils are equal, round, and reactive to light.  Pulmonary:     Effort: Pulmonary effort is normal.     Breath sounds: Normal breath sounds.  Abdominal:     General: Bowel sounds are normal.     Palpations: Abdomen is soft.  Musculoskeletal:     Cervical back: Normal range of motion and neck supple.     Lumbar back: Negative right straight leg raise test and negative left straight leg raise test.  Skin:    General: Skin is warm and dry.  Neurological:     Mental Status: He is alert and oriented to person, place, and time.  Psychiatric:        Behavior: Behavior normal.        Thought Content: Thought content normal.        Judgment: Judgment normal.    Back Exam   Tenderness  The patient is experiencing tenderness in the lumbar.  Range of Motion  Extension:  abnormal  Flexion:  abnormal  Lateral bend right:  abnormal  Lateral bend left:  abnormal  Rotation right:  abnormal  Rotation left:  abnormal   Muscle Strength  Right Quadriceps:  5/5  Left Quadriceps:  5/5  Right Hamstrings:  5/5  Left Hamstrings:  5/5   Tests  Straight leg raise right: negative Straight leg raise left: negative  Reflexes  Patellar:  2/4 Achilles:  2/4  Other  Toe walk: normal Heel walk: normal Sensation: normal Gait: normal      Specialty Comments:  No specialty comments  available.  Imaging: No results found.   PMFS History: Patient Active Problem List   Diagnosis Date Noted   Status post total left knee replacement 04/25/2021   Real time reverse transcriptase PCR positive for COVID-19 virus 03/21/2021   Status post total right knee replacement 01/03/2021   Diverticular disease of colon 09/25/2020   Colon cancer screening 09/25/2020   Statin intolerance 12/27/2018   BMI 28.0-28.9,adult 11/01/2018   H/O arthroscopy of shoulder 02/10/2018   Primary osteoarthritis of left knee 11/25/2016   Primary osteoarthritis of right knee 11/25/2016   Essential hypertension 06/28/2015   H/O prostate cancer 06/28/2015   Hereditary and idiopathic peripheral neuropathy 04/17/2015   Carpal tunnel syndrome 04/17/2015   Hyperlipemia 06/02/2013   Asthma, chronic 06/02/2013   Depression 06/02/2013   GERD 06/04/2009   LEG PAIN, BILATERAL 06/04/2009   PERCUTANEOUS TRANSLUMINAL CORONARY ANGIOPLASTY, HX OF 06/04/2009  Past Medical History:  Diagnosis Date   Arthritis    Asthma, chronic 06/02/2013   Carpal tunnel syndrome 04/17/2015   Bilateral   Depression 06/02/2013   DYSPNEA ON EXERTION 06/04/2009   Qualifier: Diagnosis of  By: Domenic Polite, MD, Phillips Hay    Hereditary and idiopathic peripheral neuropathy 04/17/2015   Hyperlipemia 06/02/2013   Hypertension    LEG PAIN, BILATERAL 06/04/2009   Qualifier: Diagnosis of  By: Domenic Polite, MD, FACC, [place], HX OF 06/04/2009   Qualifier: Diagnosis of  By: Domenic Polite, MD, Phillips Hay    Prostate CA [facility])    and basal cell nose    Family History  Problem Relation Age of Onset   Heart disease Other        Early Cardiac disease for uncle is their 77's as well as deceased.   Heart failure Mother    Dementia Mother 75   Cancer Father 37       testicular, lung   Esophageal cancer Brother     Past Surgical  History:  Procedure Laterality Date   CARDIAC CATHETERIZATION  1997   EXCISION MASS NECK Left 11/12/2020   Procedure: EXCISION LEFT POSTERIOR NECK MASS;  Surgeon: Coralie Keens, MD;  Location: [place];  Service: General;  Laterality: Left;   EYE SURGERY Bilateral 2020   cataracts removed   HERNIA REPAIR     HIATAL HERNIA REPAIR     LAPAROSCOPIC GASTRIC BANDING  0315   [facility] fundoplication   MENISCUS REPAIR Left 2012   ROBOT ASSISTED LAPAROSCOPIC RADICAL PROSTATECTOMY     ROTATOR CUFF REPAIR Right 2019   TOTAL KNEE ARTHROPLASTY Right 01/03/2021   Procedure: RIGHT TOTAL KNEE ARTHROPLASTY;  Surgeon: Mcarthur Rossetti, MD;  [facility];  Service: Orthopedics;  Laterality: Right;  Needs RNFA   TOTAL KNEE ARTHROPLASTY Left 04/25/2021   Procedure: LEFT TOTAL KNEE ARTHROPLASTY;  Surgeon: Mcarthur Rossetti, MD;  [facility];  Service: Orthopedics;  Laterality: Left;   Social History   Occupational History   Occupation: Retired    Comment: Nature conservation officer  Tobacco Use   Smoking status: Former    Packs/day: 1.00    Years: 20.00    Pack years: 20.00    Types: Cigarettes    Quit date: 01/18/1993    Years since quitting: 28.8   Smokeless tobacco: Never  Vaping Use   Vaping Use: Never used  Substance and Sexual Activity   Alcohol use: Yes    Alcohol/week: 2.0 standard drinks    Types: 2 Cans of beer per week    Comment: occasional   Drug use: No   Sexual activity: Yes

## 2021-10-30 NOTE — Patient Instructions (Addendum)
Avoid bending, stooping and avoid lifting weights greater than 10 lbs. Avoid prolong standing and walking. Avoid frequent bending and stooping  No lifting greater than 10 lbs. May use ice or moist heat for pain. Weight loss is of benefit. Handicap license is approved. Dr. Romona Curls secretary/Assistant will call to arrange for epidural steroid injection   Walking one or miles at a time and is functioning at a high level.

## 2021-11-17 ENCOUNTER — Encounter: Payer: Self-pay | Admitting: Physical Medicine and Rehabilitation

## 2021-11-17 ENCOUNTER — Ambulatory Visit: Payer: Self-pay

## 2021-11-17 ENCOUNTER — Ambulatory Visit (INDEPENDENT_AMBULATORY_CARE_PROVIDER_SITE_OTHER): Payer: Medicare Other | Admitting: Physical Medicine and Rehabilitation

## 2021-11-17 ENCOUNTER — Other Ambulatory Visit: Payer: Self-pay

## 2021-11-17 VITALS — BP 137/70 | HR 75

## 2021-11-17 DIAGNOSIS — M5416 Radiculopathy, lumbar region: Secondary | ICD-10-CM

## 2021-11-17 MED ORDER — METHYLPREDNISOLONE ACETATE 80 MG/ML IJ SUSP
80.0000 mg | Freq: Once | INTRAMUSCULAR | Status: AC
  Administered 2021-11-17: 80 mg

## 2021-11-17 NOTE — Patient Instructions (Signed)

## 2021-11-17 NOTE — Progress Notes (Signed)
Pt state lower back pain that travels down both legs. Pt state any movement makes the pain worse. Pt state he takes pain meds to help ease his pain. ? ?Numeric Pain Rating Scale and Functional Assessment ?Average Pain 2 ? ? ?In the last MONTH (on 0-10 scale) has pain interfered with the following? ? ?1. General activity like being  able to carry out your everyday physical activities such as walking, climbing stairs, carrying groceries, or moving a chair?  ?Rating(10) ? ? ?+Driver, -BT, -Dye Allergies. ? ?

## 2021-11-19 ENCOUNTER — Other Ambulatory Visit: Payer: Self-pay | Admitting: Nurse Practitioner

## 2021-11-19 DIAGNOSIS — E782 Mixed hyperlipidemia: Secondary | ICD-10-CM

## 2021-11-20 ENCOUNTER — Other Ambulatory Visit: Payer: Self-pay | Admitting: Nurse Practitioner

## 2021-11-20 DIAGNOSIS — I10 Essential (primary) hypertension: Secondary | ICD-10-CM

## 2021-11-24 NOTE — Procedures (Signed)
Lumbosacral Transforaminal Epidural Steroid Injection - Sub-Pedicular Approach with Fluoroscopic Guidance ? ?Patient: [name] [name]      ?Date of Birth: December 29, 1945 ?MRN: [id number] ?PCP: Chevis Pretty, FNP      ?Visit Date: 11/17/2021 ?  ?Universal Protocol:    ?Date/Time: 11/17/2021 ? ?Consent Given By: the patient ? ?Position: PRONE ? ?Additional Comments: ?Vital signs were monitored before and after the procedure. ?Patient was prepped and draped in the usual sterile fashion. ?The correct patient, procedure, and site was verified. ? ? ?Injection Procedure Details:  ? ?Procedure diagnoses: Lumbar radiculopathy [M54.16]   ? ?Meds Administered:  ?Meds ordered this encounter  ?Medications  ? methylPREDNISolone acetate (DEPO-MEDROL) injection 80 mg  ? ? ?Laterality: Bilateral ? ?Location/Site: L4 ? ?Needle:5.0 in., 22 ga.  Short bevel or Quincke spinal needle ? ?Needle Placement: Transforaminal ? ?Findings: ?  ? -Comments: Excellent flow of contrast along the nerve, nerve root and into the epidural space. ? ?Procedure Details: ?After squaring off the end-plates to get a true AP view, the C-arm was positioned so that an oblique view of the foramen as noted above was visualized. The target area is just inferior to the "nose of the scotty dog" or sub pedicular. The soft tissues overlying this structure were infiltrated with 2-3 ml. of 1% Lidocaine without Epinephrine. ? ?The spinal needle was inserted toward the target using a "trajectory" view along the fluoroscope beam.  Under AP and lateral visualization, the needle was advanced so it did not puncture dura and was located close the 6 O'Clock position of the pedical in AP tracterory. Biplanar projections were used to confirm position. Aspiration was confirmed to be negative for CSF and/or blood. A 1-2 ml. volume of Isovue-250 was injected and flow of contrast was noted at each level. Radiographs were obtained for documentation purposes.  ? ?After attaining the  desired flow of contrast documented above, a 0.5 to 1.0 ml test dose of 0.25% Marcaine was injected into each respective transforaminal space.  The patient was observed for 90 seconds post injection.  After no sensory deficits were reported, and normal lower extremity motor function was noted,   the above injectate was administered so that equal amounts of the injectate were placed at each foramen (level) into the transforaminal epidural space. ? ? ?Additional Comments:  ?The patient tolerated the procedure well ?Dressing: 2 x 2 sterile gauze and Band-Aid ?  ? ?Post-procedure details: ?Patient was observed during the procedure. ?Post-procedure instructions were reviewed. ? ?Patient left the clinic in stable condition. ? ?

## 2021-11-24 NOTE — Progress Notes (Signed)
? ?[name] [name] - 76 y.o. male MRN [id number]  Date of birth: [birth year] ? ?Office Visit Note: ?Visit Date: 11/17/2021 ?PCP: Chevis Pretty, FNP ?Referred by: Hassell Done, Mary-Margaret, * ? ?Subjective: ?Chief Complaint  ?Patient presents with  ? Lower Back - Pain  ? Right Leg - Pain  ? Left Leg - Pain  ? ?HPI:  [name] [name] is a 76 y.o. male who comes in today at the request of Dr. Basil Dess for planned Bilateral L4-5 Lumbar Transforaminal epidural steroid injection with fluoroscopic guidance.  The patient has failed conservative care including home exercise, medications, time and activity modification.  This injection will be diagnostic and hopefully therapeutic.  Please see requesting physician notes for further details and justification.MRI reviewed with images and spine model.  MRI reviewed in the note below.  ? ?ROS Otherwise per HPI. ? ?Assessment & Plan: ?Visit Diagnoses:  ?  ICD-10-CM   ?1. Lumbar radiculopathy  M54.16 XR C-ARM NO REPORT  ?  Epidural Steroid injection  ?  methylPREDNISolone acetate (DEPO-MEDROL) injection 80 mg  ?  ?  ?Plan: No additional findings.  ? ?Meds & Orders:  ?Meds ordered this encounter  ?Medications  ? methylPREDNISolone acetate (DEPO-MEDROL) injection 80 mg  ?  ?Orders Placed This Encounter  ?Procedures  ? XR C-ARM NO REPORT  ? Epidural Steroid injection  ?  ?Follow-up: No follow-ups on file.  ? ?Procedures: ?No procedures performed  ?Lumbosacral Transforaminal Epidural Steroid Injection - Sub-Pedicular Approach with Fluoroscopic Guidance ? ?Patient: [name] [name]      ?Date of Birth: 26-Mar-1946 ?MRN: [id number] ?PCP: Chevis Pretty, FNP      ?Visit Date: 11/17/2021 ?  ?Universal Protocol:    ?Date/Time: 11/17/2021 ? ?Consent Given By: the patient ? ?Position: PRONE ? ?Additional Comments: ?Vital signs were monitored before and after the procedure. ?Patient was prepped and draped in the usual sterile fashion. ?The correct patient, procedure, and site was  verified. ? ? ?Injection Procedure Details:  ? ?Procedure diagnoses: Lumbar radiculopathy [M54.16]   ? ?Meds Administered:  ?Meds ordered this encounter  ?Medications  ? methylPREDNISolone acetate (DEPO-MEDROL) injection 80 mg  ? ? ?Laterality: Bilateral ? ?Location/Site: L4 ? ?Needle:5.0 in., 22 ga.  Short bevel or Quincke spinal needle ? ?Needle Placement: Transforaminal ? ?Findings: ?  ? -Comments: Excellent flow of contrast along the nerve, nerve root and into the epidural space. ? ?Procedure Details: ?After squaring off the end-plates to get a true AP view, the C-arm was positioned so that an oblique view of the foramen as noted above was visualized. The target area is just inferior to the "nose of the scotty dog" or sub pedicular. The soft tissues overlying this structure were infiltrated with 2-3 ml. of 1% Lidocaine without Epinephrine. ? ?The spinal needle was inserted toward the target using a "trajectory" view along the fluoroscope beam.  Under AP and lateral visualization, the needle was advanced so it did not puncture dura and was located close the 6 O'Clock position of the pedical in AP tracterory. Biplanar projections were used to confirm position. Aspiration was confirmed to be negative for CSF and/or blood. A 1-2 ml. volume of Isovue-250 was injected and flow of contrast was noted at each level. Radiographs were obtained for documentation purposes.  ? ?After attaining the desired flow of contrast documented above, a 0.5 to 1.0 ml test dose of 0.25% Marcaine was injected into each respective transforaminal space.  The patient was observed for 90 seconds post injection.  After  no sensory deficits were reported, and normal lower extremity motor function was noted,   the above injectate was administered so that equal amounts of the injectate were placed at each foramen (level) into the transforaminal epidural space. ? ? ?Additional Comments:  ?The patient tolerated the procedure well ?Dressing: 2 x 2  sterile gauze and Band-Aid ?  ? ?Post-procedure details: ?Patient was observed during the procedure. ?Post-procedure instructions were reviewed. ? ?Patient left the clinic in stable condition. ?  ? ?Clinical History: ?MRI LUMBAR SPINE WITHOUT AND WITH CONTRAST ?  ?TECHNIQUE: ?Multiplanar and multiecho pulse sequences of the lumbar spine were ?obtained without and with intravenous contrast. ?  ?CONTRAST:  69m MULTIHANCE GADOBENATE DIMEGLUMINE 529 MG/ML IV SOLN ?  ?COMPARISON:  07/21/2020 ?  ?FINDINGS: ?Segmentation:  5 lumbar type vertebrae based on prior ?  ?Alignment:  Grade 1 anterolisthesis at L4-5, mildly progressed. ?  ?Vertebrae:  No fracture, evidence of discitis, or bone lesion. ?  ?Conus medullaris and cauda equina: Conus extends to the T12-L1 ?level. Conus and cauda equina appear normal. ?  ?Paraspinal and other soft tissues: Lower pole renal cyst on the ?left. ?  ?Disc levels: ?  ?T12- L1: Spondylosis with ventral spur that is likely bridging ?  ?L1-L2: Fairly bulky ventral spondylitic spur.  No impingement ?  ?L2-L3: Ventral spondylitic spurring.  Mild disc bulging. ?  ?L3-L4: Disc narrowing and bulging with ventral spurring. Bilateral ?facet spurring and ligamentum flavum thickening. Anterior and medial ?synovial cyst from the left facet measuring 5 mm. No neural ?compression. ?  ?L4-L5: Advanced facet degeneration with spurring and ?anterolisthesis. Bilobed anterior synovial cyst measuring 14 mm with ?compression of the right L4 nerve root. Patent left foramen. ?Moderate spinal stenosis ?  ?L5-S1:Facet osteoarthritis with asymmetric bulky spurring. Eccentric ?right disc bulging and far-lateral spurring. Moderate right ?foraminal stenosis. ?  ?IMPRESSION: ?1. Generalized lumbar spine degeneration with progression from 2021. ?2. L4-5 severe facet osteoarthritis with anterolisthesis and 14 mm ?right synovial cyst that compresses the right L4 nerve root. ?Moderate spinal stenosis at this level. ?3. L3-4  left facet synovial cyst which is new but noncompressive. ?  ?  ?Electronically Signed ?  By: JJorje GuildM.D. ?  On: 10/28/2021 19:27  ? ? ? ?Objective:  VS:  HT:    WT:   BMI:     BP:137/70  HR:75bpm  TEMP: ( )  RESP:  ?Physical Exam ?Vitals and nursing note reviewed.  ?Constitutional:   ?   General: He is not in acute distress. ?   Appearance: Normal appearance. He is not ill-appearing.  ?HENT:  ?   Head: Normocephalic and atraumatic.  ?   Right Ear: External ear normal.  ?   Left Ear: External ear normal.  ?   Nose: No congestion.  ?Eyes:  ?   Extraocular Movements: Extraocular movements intact.  ?Cardiovascular:  ?   Rate and Rhythm: Normal rate.  ?   Pulses: Normal pulses.  ?Pulmonary:  ?   Effort: Pulmonary effort is normal. No respiratory distress.  ?Abdominal:  ?   General: There is no distension.  ?   Palpations: Abdomen is soft.  ?Musculoskeletal:     ?   General: No tenderness or signs of injury.  ?   Cervical back: Neck supple.  ?   Right lower leg: No edema.  ?   Left lower leg: No edema.  ?   Comments: Patient has good distal strength without clonus.  ?Skin: ?  Findings: No erythema or rash.  ?Neurological:  ?   General: No focal deficit present.  ?   Mental Status: He is alert and oriented to person, place, and time.  ?   Sensory: No sensory deficit.  ?   Motor: No weakness or abnormal muscle tone.  ?   Coordination: Coordination normal.  ?Psychiatric:     ?   Mood and Affect: Mood normal.     ?   Behavior: Behavior normal.  ?  ? ?Imaging: ?No results found. ?

## 2021-11-27 ENCOUNTER — Ambulatory Visit: Payer: TRICARE For Life (TFL) | Admitting: Nurse Practitioner

## 2021-12-01 ENCOUNTER — Ambulatory Visit (INDEPENDENT_AMBULATORY_CARE_PROVIDER_SITE_OTHER): Payer: Medicare Other | Admitting: Nurse Practitioner

## 2021-12-01 ENCOUNTER — Encounter: Payer: Self-pay | Admitting: Nurse Practitioner

## 2021-12-01 VITALS — BP 144/71 | HR 61 | Temp 98.4°F | Resp 20 | Ht 74.0 in | Wt 259.0 lb

## 2021-12-01 DIAGNOSIS — K5730 Diverticulosis of large intestine without perforation or abscess without bleeding: Secondary | ICD-10-CM

## 2021-12-01 DIAGNOSIS — R739 Hyperglycemia, unspecified: Secondary | ICD-10-CM | POA: Diagnosis not present

## 2021-12-01 DIAGNOSIS — F3342 Major depressive disorder, recurrent, in full remission: Secondary | ICD-10-CM | POA: Diagnosis not present

## 2021-12-01 DIAGNOSIS — Z6828 Body mass index (BMI) 28.0-28.9, adult: Secondary | ICD-10-CM

## 2021-12-01 DIAGNOSIS — I10 Essential (primary) hypertension: Secondary | ICD-10-CM | POA: Diagnosis not present

## 2021-12-01 DIAGNOSIS — K219 Gastro-esophageal reflux disease without esophagitis: Secondary | ICD-10-CM | POA: Diagnosis not present

## 2021-12-01 DIAGNOSIS — E782 Mixed hyperlipidemia: Secondary | ICD-10-CM | POA: Diagnosis not present

## 2021-12-01 DIAGNOSIS — J4520 Mild intermittent asthma, uncomplicated: Secondary | ICD-10-CM

## 2021-12-01 DIAGNOSIS — Z125 Encounter for screening for malignant neoplasm of prostate: Secondary | ICD-10-CM

## 2021-12-01 LAB — BAYER DCA HB A1C WAIVED: HB A1C (BAYER DCA - WAIVED): 5.7 % — ABNORMAL HIGH (ref 4.8–5.6)

## 2021-12-01 MED ORDER — EZETIMIBE 10 MG PO TABS: 10.0000 mg | ORAL_TABLET | Freq: Every day | ORAL | 1 refills | Status: DC

## 2021-12-01 MED ORDER — BUDESONIDE-FORMOTEROL FUMARATE 160-4.5 MCG/ACT IN AERO: 2.0000 | INHALATION_SPRAY | Freq: Two times a day (BID) | RESPIRATORY_TRACT | 3 refills | Status: DC

## 2021-12-01 MED ORDER — PRAVASTATIN SODIUM 20 MG PO TABS: 10.0000 mg | ORAL_TABLET | Freq: Every day | ORAL | 1 refills | Status: DC

## 2021-12-01 MED ORDER — AMLODIPINE BESYLATE 5 MG PO TABS: 5.0000 mg | ORAL_TABLET | Freq: Every day | ORAL | 1 refills | Status: DC

## 2021-12-01 MED ORDER — ATENOLOL 25 MG PO TABS: 25.0000 mg | ORAL_TABLET | Freq: Every day | ORAL | 1 refills | Status: DC

## 2021-12-01 MED ORDER — CITALOPRAM HYDROBROMIDE 20 MG PO TABS: 20.0000 mg | ORAL_TABLET | Freq: Every day | ORAL | 1 refills | Status: DC

## 2021-12-01 MED ORDER — MONTELUKAST SODIUM 10 MG PO TABS: 10.0000 mg | ORAL_TABLET | Freq: Every day | ORAL | 1 refills | Status: DC

## 2021-12-01 NOTE — Patient Instructions (Signed)
Stress, Adult °Stress is a normal reaction to life events. Stress is what you feel when life demands more than you are used to, or more than you think you can handle. °Some stress can be useful, such as studying for a test or meeting a deadline at work. Stress that occurs too often or for too long can cause problems. Long-lasting stress is called chronic stress. Chronic stress can affect your emotional health and interfere with relationships and normal daily activities. °Too much stress can weaken your body's defense system (immune system) and increase your risk for physical illness. If you already have a medical problem, stress can make it worse. °What are the causes? °All sorts of life events can cause stress. An event that causes stress for one person may not be stressful for someone else. Major life events, whether positive or negative, commonly cause stress. Examples include: °Losing a job or starting a new job. °Losing a loved one. °Moving to a new town or home. °Getting married or divorced. °Having a baby. °Getting injured or sick. °Less obvious life events can also cause stress, especially if they occur day after day or in combination with each other. Examples include: °Working long hours. °Driving in traffic. °Caring for children. °Being in debt. °Being in a difficult relationship. °What are the signs or symptoms? °Stress can cause emotional and physical symptoms and can lead to unhealthy behaviors. These include the following: °Emotional symptoms °Anxiety. This is feeling worried, afraid, on edge, overwhelmed, or out of control. °Anger, including irritation or impatience. °Depression. This is feeling sad, down, helpless, or guilty. °Trouble focusing, remembering, or making decisions. °Physical symptoms °Aches and pains. These may affect your head, neck, back, stomach, or other areas of your body. °Tight muscles or a clenched jaw. °Low energy. °Trouble sleeping. °Unhealthy behaviors °Eating to feel better  (overeating) or skipping meals. °Working too much or putting off tasks. °Smoking, drinking alcohol, or using drugs to feel better. °How is this diagnosed? °A stress disorder is diagnosed through an assessment by your health care provider. A stress disorder may be diagnosed based on: °Your symptoms and any stressful life events. °Your medical history. °Tests to rule out other causes of your symptoms. °Depending on your condition, your health care provider may refer you to a specialist for further evaluation. °How is this treated? °Stress management techniques are the recommended treatment for stress. Medicine is not typically recommended for treating stress. °Techniques to reduce your reaction to stressful life events include: °Identifying stress. Monitor yourself for symptoms of stress and notice what causes stress for you. These skills may help you to avoid or prepare for stressful events. °Managing time. Set your priorities, keep a calendar of events, and learn to say no. These actions can help you avoid taking on too much. °Techniques for dealing with stress include: °Rethinking the problem. Try to think realistically about stressful events rather than ignoring them or overreacting. Try to find the positives in a stressful situation rather than focusing on the negatives. °Exercise. Physical exercise can release both physical and emotional tension. The key is to find a form of exercise that you enjoy and do it regularly. °Relaxation techniques. These relax the body and mind. Find one or more that you enjoy and use the techniques regularly. Examples include: °Meditation, deep breathing, or progressive relaxation techniques. °Yoga or tai chi. °Biofeedback, mindfulness techniques, or journaling. °Listening to music, being in nature, or taking part in other hobbies. °Practicing a healthy lifestyle. Eat a balanced diet,   drink plenty of water, limit or avoid caffeine, and get plenty of sleep. °Having a strong support  network. Spend time with family, friends, or other people you enjoy being around. Express your feelings and talk things over with someone you trust. °Counseling or talk therapy with a mental health provider may help if you are having trouble managing stress by yourself. °Follow these instructions at home: °Lifestyle ° °Avoid drugs. °Do not use any products that contain nicotine or tobacco. These products include cigarettes, chewing tobacco, and vaping devices, such as e-cigarettes. If you need help quitting, ask your health care provider. °If you drink alcohol: °Limit how much you have to: °0-1 drink a day for women who are not pregnant. °0-2 drinks a day for men. °Know how much alcohol is in a drink. In the [place] one drink equals one 12 oz bottle of beer (355 mL), one 5 oz glass of wine (148 mL), or one 1½ oz glass of hard liquor (44 mL). °Do not use alcohol or drugs to relax. °Eat a balanced diet that includes fresh fruits and vegetables, whole grains, lean meats, fish, eggs, beans, and low-fat dairy. Avoid processed foods and foods high in added fat, sugar, and salt. °Exercise at least 30 minutes on 5 or more days each week. °Get 7-8 hours of sleep each night. °General instructions ° °Practice stress management techniques as told by your health care provider. °Drink enough fluid to keep your urine pale yellow. °Take over-the-counter and prescription medicines only as told by your health care provider. °Keep all follow-up visits. This is important. °Contact a health care provider if: °Your symptoms get worse. °You have new symptoms. °You feel overwhelmed by your problems and can no longer manage them by yourself. °Get help right away if: °You have thoughts of hurting yourself or others. °Get help right awayif you feel like you may hurt yourself or others, or have thoughts about taking your own life. Go to your nearest emergency room or: °Call 911. °Call the National Suicide Prevention Lifeline at 1-800-273-8255 or  988. This is open 24 hours a day. °Text the Crisis Text Line at 741741. °Summary °Stress is a normal reaction to life events. It can cause problems if it happens too often or for too long. °Practicing stress management techniques is the best way to treat stress. °Counseling or talk therapy with a mental health provider may help if you are having trouble managing stress by yourself. °This information is not intended to replace advice given to you by your health care provider. Make sure you discuss any questions you have with your health care provider. °Document Revised: 04/10/2021 Document Reviewed: 04/10/2021 °[employer] Patient Education © 2022 [employer] ° °

## 2021-12-01 NOTE — Progress Notes (Signed)
? ?Subjective:  ? ? Patient ID: [name] [name], male    DOB: 03-03-1946, 76 y.o.   MRN: [id number] ? ? ?Chief Complaint: medical management of chronic issues  ?  ? ?HPI: ? ?[name] [name] is a 76 y.o. who identifies as a male who was assigned male at birth.  ? ?Social history: ?Lives with: wife retired-  ?Work history: retired- was in [employer] for several years ? ? ?Comes in today for follow up of the following chronic medical issues: ? ?1. Essential hypertension ?No c/o chest pain, sob or headache. Does not check blood pressure at home. ?BP Readings from Last 3 Encounters:  ?11/17/21 137/70  ?10/30/21 136/72  ?09/10/21 (!) 148/83  ? ? ? ?2. Gastroesophageal reflux disease, unspecified whether esophagitis present ?On no prescription meds. Takes OTC meds as needed, which is not very often ? ?3. Diverticular disease of colon ?No recent flare ups ? ?4. Mixed hyperlipidemia ?Does not really watch diet and does no dedicated exercise. He does try to stay active. ?Lab Results  ?Component Value Date  ? CHOL 147 05/27/2021  ? HDL 41 05/27/2021  ? [place] 52 05/27/2021  ? LDLDIRECT 92.8 08/28/2014  ? TRIG 357 (H) 05/27/2021  ? CHOLHDL 3.6 05/27/2021  ? ?The 10-year ASCVD risk score (Arnett DK, et al., 2019) is: 35.2% ? ? ?5. Recurrent major depressive disorder, in full remission ([place]) ?He has been on paxil for several years and is doing well. No medication side effects ?Depression screen [facility] 2/9 12/01/2021 10/27/2021 05/27/2021  ?Decreased Interest 0 0 0  ?Down, Depressed, Hopeless 0 0 0  ?PHQ - 2 Score 0 0 0  ?Altered sleeping 0 - 1  ?Tired, decreased energy 0 - 1  ?Change in appetite 0 - 0  ?Feeling bad or failure about yourself  0 - 0  ?Trouble concentrating 0 - 1  ?Moving slowly or fidgety/restless 0 - 0  ?Suicidal thoughts 0 - 0  ?PHQ-9 Score 0 - 3  ?Difficult doing work/chores Not difficult at all - Not difficult at all  ?Some recent data might be hidden  ? ? ? ?6. BMI 28.0-28.9,adult ?Weight is up 4lbs ?Wt Readings from  Last 3 Encounters:  ?12/01/21 259 lb (117.5 kg)  ?10/30/21 255 lb (115.7 kg)  ?10/27/21 253 lb (114.8 kg)  ? ?BMI Readings from Last 3 Encounters:  ?12/01/21 33.25 kg/m?  ?10/30/21 32.74 kg/m?  ?10/27/21 32.48 kg/m?  ? ? ? ? ?New complaints: ?None today ? ?Allergies  ?Allergen Reactions  ? Fenofibrate Other (See Comments)  ?  Pt reports causes joint pains  ? Rosuvastatin Other (See Comments)  ?  Pt reports causes joint pains  ? ?Outpatient Encounter Medications as of 12/01/2021  ?Medication Sig  ? amLODipine (NORVASC) 5 MG tablet TAKE 1 TABLET BY MOUTH EVERY DAY  ? aspirin 81 MG chewable tablet Chew 1 tablet (81 mg total) by mouth 2 (two) times daily.  ? atenolol (TENORMIN) 25 MG tablet TAKE 1 TABLET BY MOUTH EVERY DAY  ? budesonide-formoterol (SYMBICORT) 160-4.5 MCG/ACT inhaler Inhale 2 puffs into the lungs 2 (two) times daily.  ? Cholecalciferol (VITAMIN D3) 25 MCG (1000 UT) CAPS Take 1,000 Units by mouth daily.  ? citalopram (CELEXA) 20 MG tablet TAKE 1 TABLET BY MOUTH EVERY DAY  ? cyanocobalamin (,VITAMIN B-12,) 1000 MCG/ML injection Inject 1 mL (1,000 mcg total) into the muscle every 30 (thirty) days.  ? ezetimibe (ZETIA) 10 MG tablet TAKE ONE TABLET ONCE DAILY  ? loratadine (CLARITIN)  10 MG tablet Take 10 mg by mouth daily as needed for allergies.  ? methocarbamol (ROBAXIN) 500 MG tablet Take 1 tablet (500 mg total) by mouth every 6 (six) hours as needed for muscle spasms.  ? montelukast (SINGULAIR) 10 MG tablet TAKE 1 TABLET BY MOUTH DAILY  ? naproxen (NAPROSYN) 500 MG tablet Take 1 tablet (500 mg total) by mouth daily.  ? oxyCODONE (OXY IR/ROXICODONE) 5 MG immediate release tablet Take 1-2 tablets (5-10 mg total) by mouth every 4 (four) hours as needed for moderate pain (pain score 4-6).  ? pravastatin (PRAVACHOL) 20 MG tablet Take 0.5 tablets (10 mg total) by mouth daily.  ? sildenafil (REVATIO) 20 MG tablet Take 20 mg by mouth as needed.  ? ?No facility-administered encounter medications on file as of  12/01/2021.  ? ? ?Past Surgical History:  ?Procedure Laterality Date  ? CARDIAC CATHETERIZATION  1997  ? EXCISION MASS NECK Left 11/12/2020  ? Procedure: EXCISION LEFT POSTERIOR NECK MASS;  Surgeon: Abigail Miyamoto, MD;  Location: [facility];  Service: General;  Laterality: Left;  ? EYE SURGERY Bilateral 2020  ? cataracts removed  ? HERNIA REPAIR    ? HIATAL HERNIA REPAIR    ? LAPAROSCOPIC GASTRIC BANDING  2000  ? [facility] fundoplication  ? MENISCUS REPAIR Left 2012  ? ROBOT ASSISTED LAPAROSCOPIC RADICAL PROSTATECTOMY    ? ROTATOR CUFF REPAIR Right 2019  ? TOTAL KNEE ARTHROPLASTY Right 01/03/2021  ? Procedure: RIGHT TOTAL KNEE ARTHROPLASTY;  Surgeon: Kathryne Hitch, MD;  Location: [facility];  Service: Orthopedics;  Laterality: Right;  Needs RNFA  ? TOTAL KNEE ARTHROPLASTY Left 04/25/2021  ? Procedure: LEFT TOTAL KNEE ARTHROPLASTY;  Surgeon: Kathryne Hitch, MD;  Location: [facility];  Service: Orthopedics;  Laterality: Left;  ? ? ?Family History  ?Problem Relation Age of Onset  ? Heart disease Other   ?     Early Cardiac disease for uncle is their 74's as well as deceased.  ? Heart failure Mother   ? Dementia Mother 83  ? Cancer Father 82  ?     testicular, lung  ? Esophageal cancer Brother   ? ? ? ? ?Controlled substance contract: n/a ? ? ? ? ?Review of Systems  ?Constitutional:  Negative for diaphoresis.  ?Eyes:  Negative for pain.  ?Respiratory:  Negative for shortness of breath.   ?Cardiovascular:  Negative for chest pain, palpitations and leg swelling.  ?Gastrointestinal:  Negative for abdominal pain.  ?Endocrine: Negative for polydipsia.  ?Skin:  Negative for rash.  ?Neurological:  Negative for dizziness, weakness and headaches.  ?Hematological:  Does not bruise/bleed easily.  ?All other systems reviewed and are negative. ? ?   ?Objective:  ? Physical Exam ?Vitals and nursing note reviewed.  ?Constitutional:   ?   Appearance: Normal appearance. He is well-developed.  ?HENT:  ?   Head:  Normocephalic.  ?   Nose: Nose normal.  ?   Mouth/Throat:  ?   Mouth: Mucous membranes are moist.  ?   Pharynx: Oropharynx is clear.  ?Eyes:  ?   Pupils: Pupils are equal, round, and reactive to light.  ?Neck:  ?   Thyroid: No thyroid mass or thyromegaly.  ?   Vascular: No carotid bruit or JVD.  ?   Trachea: Phonation normal.  ?Cardiovascular:  ?   Rate and Rhythm: Normal rate and regular rhythm.  ?Pulmonary:  ?   Effort: Pulmonary effort is normal. No respiratory distress.  ?  Breath sounds: Normal breath sounds.  ?Abdominal:  ?   General: Bowel sounds are normal.  ?   Palpations: Abdomen is soft.  ?   Tenderness: There is no abdominal tenderness.  ?Musculoskeletal:     ?   General: Normal range of motion.  ?   Cervical back: Normal range of motion and neck supple.  ?Lymphadenopathy:  ?   Cervical: No cervical adenopathy.  ?Skin: ?   General: Skin is warm and dry.  ?Neurological:  ?   Mental Status: He is alert and oriented to person, place, and time.  ?Psychiatric:     ?   Behavior: Behavior normal.     ?   Thought Content: Thought content normal.     ?   Judgment: Judgment normal.  ? ?BP (!) 144/71   Pulse 61   Temp 98.4 ?F (36.9 ?C) (Temporal)   Resp 20   Ht $R'6\' 2"'ES$  (1.88 m)   Wt 259 lb (117.5 kg)   SpO2 95%   BMI 33.25 kg/m?  ?Hgba1c 5.7% ? ? ? ?   ?Assessment & Plan:  ?[name] [name] comes in today with chief complaint of Medical Management of Chronic Issues ? ? ?Diagnosis and orders addressed: ? ?1. Essential hypertension ?Low sodium diet ?- CBC with Differential/Platelet ?- CMP14+EGFR ?- atenolol (TENORMIN) 25 MG tablet; Take 1 tablet (25 mg total) by mouth daily.  Dispense: 90 tablet; Refill: 1 ?- amLODipine (NORVASC) 5 MG tablet; Take 1 tablet (5 mg total) by mouth daily.  Dispense: 90 tablet; Refill: 1 ?- budesonide-formoterol (SYMBICORT) 160-4.5 MCG/ACT inhaler; Inhale 2 puffs into the lungs 2 (two) times daily.  Dispense: 3 each; Refill: 3 ? ?2. Gastroesophageal reflux disease, unspecified whether  esophagitis present ?Avoid spicy foods ?Do not eat 2 hours prior to bedtime ? ?3. Diverticular disease of colon ?Watch diet to prevent flare up ? ?4. Mixed hyperlipidemia ?Low fat diet ?- Lipid panel ?- ezetimibe (ZETIA

## 2021-12-02 LAB — CMP14+EGFR
ALT: 21 IU/L (ref 0–44)
AST: 18 IU/L (ref 0–40)
Albumin/Globulin Ratio: 1.8 (ref 1.2–2.2)
Albumin: 4.2 g/dL (ref 3.7–4.7)
Alkaline Phosphatase: 86 IU/L (ref 44–121)
BUN/Creatinine Ratio: 13 (ref 10–24)
BUN: 20 mg/dL (ref 8–27)
Bilirubin Total: 0.3 mg/dL (ref 0.0–1.2)
CO2: 25 mmol/L (ref 20–29)
Calcium: 9.5 mg/dL (ref 8.6–10.2)
Chloride: 98 mmol/L (ref 96–106)
Creatinine, Ser: 1.54 mg/dL — ABNORMAL HIGH (ref 0.76–1.27)
Globulin, Total: 2.3 g/dL (ref 1.5–4.5)
Glucose: 124 mg/dL — ABNORMAL HIGH (ref 70–99)
Potassium: 4.9 mmol/L (ref 3.5–5.2)
Sodium: 142 mmol/L (ref 134–144)
Total Protein: 6.5 g/dL (ref 6.0–8.5)
eGFR: 47 mL/min/{1.73_m2} — ABNORMAL LOW (ref >59)

## 2021-12-02 LAB — CBC WITH DIFFERENTIAL/PLATELET
Basophils Absolute: 0.1 10*3/uL (ref 0.0–0.2)
Basos: 1 %
EOS (ABSOLUTE): 0.1 10*3/uL (ref 0.0–0.4)
Eos: 1 %
Hematocrit: 42.4 % (ref 37.5–51.0)
Hemoglobin: 14.3 g/dL (ref 13.0–17.7)
Immature Grans (Abs): 0 10*3/uL (ref 0.0–0.1)
Immature Granulocytes: 0 %
Lymphocytes Absolute: 3.2 10*3/uL — ABNORMAL HIGH (ref 0.7–3.1)
Lymphs: 37 %
MCH: 31.4 pg (ref 26.6–33.0)
MCHC: 33.7 g/dL (ref 31.5–35.7)
MCV: 93 fL (ref 79–97)
Monocytes Absolute: 0.6 10*3/uL (ref 0.1–0.9)
Monocytes: 7 %
Neutrophils Absolute: 4.7 10*3/uL (ref 1.4–7.0)
Neutrophils: 54 %
Platelets: 215 10*3/uL (ref 150–450)
RBC: 4.55 x10E6/uL (ref 4.14–5.80)
RDW: 13.2 % (ref 11.6–15.4)
WBC: 8.7 10*3/uL (ref 3.4–10.8)

## 2021-12-02 LAB — LIPID PANEL
Chol/HDL Ratio: 3.2 ratio (ref 0.0–5.0)
Cholesterol, Total: 140 mg/dL (ref 100–199)
HDL: 44 mg/dL (ref >39)
LDL Chol Calc (NIH): 59 mg/dL (ref 0–99)
Triglycerides: 232 mg/dL — ABNORMAL HIGH (ref 0–149)
VLDL Cholesterol Cal: 37 mg/dL (ref 5–40)

## 2021-12-03 ENCOUNTER — Ambulatory Visit (INDEPENDENT_AMBULATORY_CARE_PROVIDER_SITE_OTHER): Payer: Medicare Other | Admitting: Orthopaedic Surgery

## 2021-12-03 ENCOUNTER — Ambulatory Visit (INDEPENDENT_AMBULATORY_CARE_PROVIDER_SITE_OTHER): Payer: Medicare Other

## 2021-12-03 ENCOUNTER — Encounter: Payer: Self-pay | Admitting: Orthopaedic Surgery

## 2021-12-03 ENCOUNTER — Ambulatory Visit: Payer: Self-pay

## 2021-12-03 DIAGNOSIS — Z96651 Presence of right artificial knee joint: Secondary | ICD-10-CM

## 2021-12-03 DIAGNOSIS — Z96652 Presence of left artificial knee joint: Secondary | ICD-10-CM

## 2021-12-03 NOTE — Progress Notes (Signed)
HPI: [name] swelling Dr. Delilah Shan service comes in today for follow-up of his left total knee which was performed on 04/25/2021.  He also underwent a right total knee arthroplasty 01/03/2021.  States both knees still have cracking sound at times.  Some pain in the left knee.  Right knee is overall doing well.  No new injury to either knee.  States he is not really doing any exercises for quad strengthening either knee. ? ?Review of systems see HPI otherwise negative or noncontributory. ? ?Physical exam: General well-developed well-nourished male no acute distress. ?Bilateral knees: Surgical incisions are well-healed no signs of infection.  No abnormal warmth erythema or effusion of either knee.  Varus stressing both knees reveals slight laxity.  He has quad atrophy bilaterally.  Full range of motion of both knees without significant pain.  Anterior drawer is negative bilaterally. ? ?Radiographs: Left knee 2 views: No acute fractures.  Knee is well located.  Total knee components well-seated. ?Right knee 2 views: Knees well located.  No acute fractures no bony abnormalities.  Total arthroplasty components well-seated. ? ?Impression: Status post left total knee arthroplasty 04/25/2021 ?Status post right total knee arthroplasty 01/03/2021 ? ?Plan: Recommend quad strengthening.  He is agreeable.  We will send him to physical therapy for quad strengthening.  We will see him back in 3 months no x-rays at that time unless clinically indicated.  Questions were encouraged and answered by Dr. Ninfa Linden and myself. ?

## 2021-12-03 NOTE — Addendum Note (Signed)
Addended by: Robyne Peers on: 12/03/2021 04:08 PM ? ? Modules accepted: Orders ? ?

## 2021-12-09 ENCOUNTER — Other Ambulatory Visit: Payer: Self-pay

## 2021-12-09 ENCOUNTER — Ambulatory Visit: Payer: Medicare Other | Attending: Physician Assistant

## 2021-12-09 DIAGNOSIS — Z96651 Presence of right artificial knee joint: Secondary | ICD-10-CM | POA: Diagnosis not present

## 2021-12-09 DIAGNOSIS — M6281 Muscle weakness (generalized): Secondary | ICD-10-CM | POA: Insufficient documentation

## 2021-12-09 DIAGNOSIS — Z96652 Presence of left artificial knee joint: Secondary | ICD-10-CM | POA: Insufficient documentation

## 2021-12-09 NOTE — Therapy (Signed)
[facility] ?[address] ?[place], [place], [address] ?Phone: [phone]   Fax:  [phone] ? ?Physical Therapy Evaluation ? ?Patient Details  ?Name: [name] [name] ?MRN: [id number] ?Date of Birth: 07-12-1946 ?Referring Provider (PT): Clarck, PA-C ? ? ?Encounter Date: 12/09/2021 ? ? PT End of Session - 12/09/21 1036   ? ? Visit Number 1   ? Number of Visits 8   ? Date for PT Re-Evaluation 02/06/22   ? PT Start Time 1038   ? PT Stop Time 1108   ? PT Time Calculation (min) 30 min   ? Activity Tolerance Patient tolerated treatment well   ? Behavior During Therapy [facility] for tasks assessed/performed   ? ?  ?  ? ?  ? ? ?Past Medical History:  ?Diagnosis Date  ? Arthritis   ? Asthma, chronic 06/02/2013  ? Carpal tunnel syndrome 04/17/2015  ? Bilateral  ? Depression 06/02/2013  ? DYSPNEA ON EXERTION 06/04/2009  ? Qualifier: Diagnosis of  By: Domenic Polite, MD, Phillips Hay   ? Hereditary and idiopathic peripheral neuropathy 04/17/2015  ? Hyperlipemia 06/02/2013  ? Hypertension   ? LEG PAIN, BILATERAL 06/04/2009  ? Qualifier: Diagnosis of  By: Domenic Polite, MD, Phillips Hay   ? PERCUTANEOUS TRANSLUMINAL CORONARY ANGIOPLASTY, HX OF 06/04/2009  ? Qualifier: Diagnosis of  By: Domenic Polite, MD, Phillips Hay   ? Prostate CA [facility])   ? and basal cell nose  ? ? ?Past Surgical History:  ?Procedure Laterality Date  ? CARDIAC CATHETERIZATION  1997  ? EXCISION MASS NECK Left 11/12/2020  ? Procedure: EXCISION LEFT POSTERIOR NECK MASS;  Surgeon: Coralie Keens, MD;  Location: [facility];  Service: General;  Laterality: Left;  ? EYE SURGERY Bilateral 2020  ? cataracts removed  ? HERNIA REPAIR    ? HIATAL HERNIA REPAIR    ? LAPAROSCOPIC GASTRIC BANDING  2000  ? [facility] fundoplication  ? MENISCUS REPAIR Left 2012  ? ROBOT ASSISTED LAPAROSCOPIC RADICAL PROSTATECTOMY    ? ROTATOR CUFF REPAIR Right 2019  ? TOTAL KNEE ARTHROPLASTY Right 01/03/2021  ? Procedure: RIGHT TOTAL  KNEE ARTHROPLASTY;  Surgeon: Mcarthur Rossetti, MD;  Location: [facility];  Service: Orthopedics;  Laterality: Right;  Needs RNFA  ? TOTAL KNEE ARTHROPLASTY Left 04/25/2021  ? Procedure: LEFT TOTAL KNEE ARTHROPLASTY;  Surgeon: Mcarthur Rossetti, MD;  Location: [facility];  Service: Orthopedics;  Laterality: Left;  ? ? ?There were no vitals filed for this visit. ? ? ? Subjective Assessment - 12/09/21 1037   ? ? Subjective Patient reports that he had both knee replaced last year. His referring physician wants him to go to physical therapy to work on his quads strength. He has crepitus in both knees with transfers, but he notes that this has been happening since surgery. However, this is not pain ful.   ? Pertinent History bilateral TKA"s, chronic LBP   ? Patient Stated Goals strengthen quads   ? Currently in Pain? No/denies   ? ?  ?  ? ?  ? ? ? ? ? [facility] PT Assessment - 12/09/21 0001   ? ?  ? Assessment  ? Medical Diagnosis Status post right and left knee replacement   ? Referring Provider (PT) Clarck, PA-C   ? Onset Date/Surgical Date --   March and April 2022  ? Prior Therapy Yes   ?  ? Precautions  ? Precautions None   ?  ? Restrictions  ? Weight Bearing Restrictions  No   ?  ? Balance Screen  ? Has the patient fallen in the past 6 months No   ? Has the patient had a decrease in activity level because of a fear of falling?  No   ? Is the patient reluctant to leave their home because of a fear of falling?  No   ?  ? Home Environment  ? Living Environment Private residence   ?  ? Prior Function  ? Level of Independence Independent   ? Leisure working on cars   ?  ? Cognition  ? Overall Cognitive Status Within Functional Limits for tasks assessed   ? Attention Focused   ? Focused Attention Appears intact   ? Memory Appears intact   ? Awareness Appears intact   ? Problem Solving Appears intact   ?  ? Sensation  ? Additional Comments Patient reports numbness and tingling in both legs due to low back   ?  ? ROM /  Strength  ? AROM / PROM / Strength AROM;Strength   ?  ? AROM  ? Overall AROM  Within functional limits for tasks performed   ?  ? Strength  ? Strength Assessment Site Hip;Knee   ? Right/Left Hip Right;Left   ? Right Hip Flexion 4/5   ? Left Hip Flexion 4/5   ? Right/Left Knee Right;Left   ? Right Knee Flexion 4+/5   ? Right Knee Extension 5/5   ? Left Knee Flexion 4+/5   ? Left Knee Extension 5/5   ?  ? Transfers  ? Transfers Sit to Stand;Stand to Sit   ? Sit to Stand 7: Independent;Without upper extremity assist   ? Five time sit to stand comments  14 seconds   ? Stand to Sit 7: Independent;Without upper extremity assist   ?  ? Ambulation/Gait  ? Ambulation/Gait Yes   ? Ambulation/Gait Assistance 7: Independent   ? Assistive device None   ? Gait Pattern Within Functional Limits   ? Ambulation Surface Level;Indoor   ? Gait velocity WFL   ?  ? Balance  ? Balance Assessed Yes   ?  ? Static Standing Balance  ? Static Standing - Balance Support No upper extremity supported   ? Static Standing Balance -  Activities  Tandam Stance - Right Leg;Tandam Stance - Left Leg   ? Static Standing - Comment/# of Minutes R foot leading: 30 seconds: L foot leading: 3 seconds   ? ?  ?  ? ?  ? ? ? ? ? ? ? ? ? ? ? ? ? ?Objective measurements completed on examination: See above findings.  ? ? ? ? ? ? ? ? ? ? ? ? ? ? ? ? ? ? ? PT Long Term Goals - 12/09/21 1626   ? ?  ? PT LONG TERM GOAL #1  ? Title Patient will be independent with his HEP.   ? Time 4   ? Period Weeks   ? Status New   ? Target Date 01/06/22   ?  ? PT LONG TERM GOAL #2  ? Title Patient will improve his five time sit to stand to 12 seconds or less for improved lower extremity power.   ? Time 4   ? Period Weeks   ? Status New   ? Target Date 01/06/22   ?  ? PT LONG TERM GOAL #3  ? Title Patient will improve his bilateral hamstring strength to 5/5 for improved lower extremity  strength.   ? Time 4   ? Period Weeks   ? Status New   ? Target Date 01/06/22   ? ?  ?  ? ?   ? ? ? ? ? ? ? ? ? Plan - 12/09/21 1412   ? ? Clinical Impression Statement Patient is a 76 year old male presenting to physical therapy for lower extremity strengthening following bilateral TKA's in March and April 2022. He exhibited no pain or discomfort with any of today's assessments. However, he continues to experience bilateral crepitus in both knees with activities such as transfers and other functional activites. Recommend that he continue with skilled physical therapy to address his impairments to maximize his functional mobility.   ? Personal Factors and Comorbidities Comorbidity 3+;Time since onset of injury/illness/exacerbation   ? Comorbidities HTN, asthma, and OA   ? Examination-Activity Limitations Squat   ? Examination-Participation Restrictions Other   ? Stability/Clinical Decision Making Stable/Uncomplicated   ? Clinical Decision Making Low   ? Rehab Potential Good   ? PT Frequency 2x / week   ? PT Duration 4 weeks   ? PT Treatment/Interventions Neuromuscular re-education;Balance training;Therapeutic exercise;Patient/family education;Therapeutic activities;Functional mobility training   ? PT Next Visit Plan recumbent bike, lower extremity strengthening,, and balance interventions   ? Consulted and Agree with Plan of Care Patient   ? ?  ?  ? ?  ? ? ?Patient will benefit from skilled therapeutic intervention in order to improve the following deficits and impairments:  Decreased balance, Impaired sensation, Decreased strength ? ?Visit Diagnosis: ?Muscle weakness (generalized) ? ? ? ? ?Problem List ?Patient Active Problem List  ? Diagnosis Date Noted  ? Status post total left knee replacement 04/25/2021  ? Status post total right knee replacement 01/03/2021  ? Diverticular disease of colon 09/25/2020  ? Statin intolerance 12/27/2018  ? BMI 28.0-28.9,adult 11/01/2018  ? H/O arthroscopy of shoulder 02/10/2018  ? Primary osteoarthritis of left knee 11/25/2016  ? Primary osteoarthritis of right knee  11/25/2016  ? Essential hypertension 06/28/2015  ? H/O prostate cancer 06/28/2015  ? Hereditary and idiopathic peripheral neuropathy 04/17/2015  ? Carpal tunnel syndrome 04/17/2015  ? Hyperlipemia 06/02/2013  ? Asthma, chroni

## 2021-12-11 ENCOUNTER — Encounter: Payer: Self-pay | Admitting: Physical Therapy

## 2021-12-11 ENCOUNTER — Ambulatory Visit: Payer: Medicare Other | Admitting: Physical Therapy

## 2021-12-11 DIAGNOSIS — M6281 Muscle weakness (generalized): Secondary | ICD-10-CM | POA: Diagnosis not present

## 2021-12-11 DIAGNOSIS — Z96651 Presence of right artificial knee joint: Secondary | ICD-10-CM | POA: Diagnosis not present

## 2021-12-11 DIAGNOSIS — Z96652 Presence of left artificial knee joint: Secondary | ICD-10-CM | POA: Diagnosis not present

## 2021-12-11 NOTE — Therapy (Signed)
[place] ?[facility] ?[address] ?[place], [place], [address] ?Phone: [phone]   Fax:  [phone] ? ?Physical Therapy Treatment ? ?Patient Details  ?Name: [name] [name] ?MRN: [id number] ?Date of Birth: [birth year] ?Referring Provider (PT): Clarck, PA-C ? ? ?Encounter Date: 12/11/2021 ? ? PT End of Session - 12/11/21 1116   ? ? Visit Number 2   ? Number of Visits 8   ? Date for PT Re-Evaluation 02/06/22   ? PT Start Time 1031   ? PT Stop Time 1113   ? PT Time Calculation (min) 42 min   ? Activity Tolerance Patient tolerated treatment well   ? Behavior During Therapy [facility] for tasks assessed/performed   ? ?  ?  ? ?  ? ? ?Past Medical History:  ?Diagnosis Date  ? Arthritis   ? Asthma, chronic 06/02/2013  ? Carpal tunnel syndrome 04/17/2015  ? Bilateral  ? Depression 06/02/2013  ? DYSPNEA ON EXERTION 06/04/2009  ? Qualifier: Diagnosis of  By: Domenic Polite, MD, Phillips Hay   ? Hereditary and idiopathic peripheral neuropathy 04/17/2015  ? Hyperlipemia 06/02/2013  ? Hypertension   ? LEG PAIN, BILATERAL 06/04/2009  ? Qualifier: Diagnosis of  By: Domenic Polite, MD, Phillips Hay   ? PERCUTANEOUS TRANSLUMINAL CORONARY ANGIOPLASTY, HX OF 06/04/2009  ? Qualifier: Diagnosis of  By: Domenic Polite, MD, Phillips Hay   ? Prostate CA [facility])   ? and basal cell nose  ? ? ?Past Surgical History:  ?Procedure Laterality Date  ? CARDIAC CATHETERIZATION  1997  ? EXCISION MASS NECK Left 11/12/2020  ? Procedure: EXCISION LEFT POSTERIOR NECK MASS;  Surgeon: Coralie Keens, MD;  Location: [place];  Service: General;  Laterality: Left;  ? EYE SURGERY Bilateral 2020  ? cataracts removed  ? HERNIA REPAIR    ? HIATAL HERNIA REPAIR    ? LAPAROSCOPIC GASTRIC BANDING  2000  ? [facility] fundoplication  ? MENISCUS REPAIR Left 2012  ? ROBOT ASSISTED LAPAROSCOPIC RADICAL PROSTATECTOMY    ? ROTATOR CUFF REPAIR Right 2019  ? TOTAL KNEE ARTHROPLASTY Right 01/03/2021  ? Procedure: RIGHT TOTAL KNEE  ARTHROPLASTY;  Surgeon: Mcarthur Rossetti, MD;  [facility];  Service: Orthopedics;  Laterality: Right;  Needs RNFA  ? TOTAL KNEE ARTHROPLASTY Left 04/25/2021  ? Procedure: LEFT TOTAL KNEE ARTHROPLASTY;  Surgeon: Mcarthur Rossetti, MD;  Location[facility];  Service: Orthopedics;  Laterality: Left;  ? ? ?There were no vitals filed for this visit. ? ? Subjective Assessment - 12/11/21 1031   ? ? Subjective Reports creaking and popping and MD wants to work on his quads.   ? Pertinent History bilateral TKA"s, chronic LBP   ? Patient Stated Goals strengthen quads   ? Currently in Pain? No/denies   ? ?  ?  ? ?  ? ? ? ? ? [facility] PT Assessment - 12/11/21 0001   ? ?  ? Assessment  ? Medical Diagnosis Status post right and left knee replacement   ? Referring Provider (PT) Clarck, PA-C   ? Prior Therapy Yes   ?  ? Precautions  ? Precautions None   ?  ? Restrictions  ? Weight Bearing Restrictions No   ? ?  ?  ? ?  ? ? ? ? ? ? ? ? ? ? ? ? ? ? ? ? [place] Adult PT Treatment/Exercise - 12/11/21 0001   ? ?  ? Exercises  ? Exercises Knee/Hip   ?  ? Knee/Hip  Exercises: Aerobic  ? Recumbent Bike L4 x15 min   ?  ? Knee/Hip Exercises: Machines for Strengthening  ? Cybex Knee Extension 10# x2 min   ? Cybex Knee Flexion 30# x2 min   ? Cybex Leg Press 2 pl, seat 9 x3 min   ?  ? Knee/Hip Exercises: Standing  ? Hip Abduction Stengthening;Both;20 reps;Knee straight;Limitations   ? Abduction Limitations 4#   ? Lateral Step Up Both;20 reps;Hand Hold: 2;Step Height: 6"   ? Forward Step Up Both;20 reps;Hand Hold: 2;Step Height: 6"   ? Step Down Both;20 reps;Hand Hold: 2;Step Height: 4"   ? ?  ?  ? ?  ? ? ? ? ? ? ? ? ? ? ? ? ? ? ? PT Long Term Goals - 12/09/21 1626   ? ?  ? PT LONG TERM GOAL #1  ? Title Patient will be independent with his HEP.   ? Time 4   ? Period Weeks   ? Status New   ? Target Date 01/06/22   ?  ? PT LONG TERM GOAL #2  ? Title Patient will improve his five time sit to stand to 12 seconds or less for improved lower  extremity power.   ? Time 4   ? Period Weeks   ? Status New   ? Target Date 01/06/22   ?  ? PT LONG TERM GOAL #3  ? Title Patient will improve his bilateral hamstring strength to 5/5 for improved lower extremity strength.   ? Time 4   ? Period Weeks   ? Status New   ? Target Date 01/06/22   ? ?  ?  ? ?  ? ? ? ? ? ? ? ? Plan - 12/11/21 1123   ? ? Clinical Impression Statement Patient presented in clinic with reports of B knee discomfort and crepitus L> R. Patient progressed through advanced LE strengthening via step ups, resistance and machines. No complaints of pain during therex. Focus on quad activation with toe raises. Patient reported feeling some fatigue following end of treatment.   ? Personal Factors and Comorbidities Comorbidity 3+;Time since onset of injury/illness/exacerbation   ? Comorbidities HTN, asthma, and OA   ? Examination-Activity Limitations Squat   ? Examination-Participation Restrictions Other   ? Stability/Clinical Decision Making Stable/Uncomplicated   ? Rehab Potential Good   ? PT Frequency 2x / week   ? PT Duration 4 weeks   ? PT Treatment/Interventions Neuromuscular re-education;Balance training;Therapeutic exercise;Patient/family education;Therapeutic activities;Functional mobility training   ? PT Next Visit Plan recumbent bike, lower extremity strengthening,, and balance interventions   ? Consulted and Agree with Plan of Care Patient   ? ?  ?  ? ?  ? ? ?Patient will benefit from skilled therapeutic intervention in order to improve the following deficits and impairments:  Decreased balance, Impaired sensation, Decreased strength ? ?Visit Diagnosis: ?Muscle weakness (generalized) ? ? ? ? ?Problem List ?Patient Active Problem List  ? Diagnosis Date Noted  ? Status post total left knee replacement 04/25/2021  ? Status post total right knee replacement 01/03/2021  ? Diverticular disease of colon 09/25/2020  ? Statin intolerance 12/27/2018  ? BMI 28.0-28.9,adult 11/01/2018  ? H/O arthroscopy of  shoulder 02/10/2018  ? Primary osteoarthritis of left knee 11/25/2016  ? Primary osteoarthritis of right knee 11/25/2016  ? Essential hypertension 06/28/2015  ? H/O prostate cancer 06/28/2015  ? Hereditary and idiopathic peripheral neuropathy 04/17/2015  ? Carpal tunnel syndrome 04/17/2015  ? Hyperlipemia  06/02/2013  ? Asthma, chronic 06/02/2013  ? Depression 06/02/2013  ? GERD 06/04/2009  ? PERCUTANEOUS TRANSLUMINAL CORONARY ANGIOPLASTY, HX OF 06/04/2009  ? ? ?Standley Brooking, PTA ?12/11/2021, 12:05 PM ? ?[place] ?[facility] ?[address] ?[place], [place], [address] ?Phone: [phone]   Fax:  [phone] ? ?Name: [name] [name] ?MRN: [id number] ?Date of Birth: [birth year] ? ? ? ?

## 2021-12-16 ENCOUNTER — Ambulatory Visit: Payer: Medicare Other | Attending: Physician Assistant

## 2021-12-16 DIAGNOSIS — M6281 Muscle weakness (generalized): Secondary | ICD-10-CM | POA: Insufficient documentation

## 2021-12-16 NOTE — Therapy (Signed)
[place] ?[facility] ?[address] ?[place], [place], [address] ?Phone: [phone]   Fax:  [phone] ? ?Physical Therapy Treatment ? ?Patient Details  ?Name: [name] [name] ?MRN: [id number] ?Date of Birth: 12-Jun-1946 ?Referring Provider (PT): Clarck, PA-C ? ? ?Encounter Date: 12/16/2021 ? ? PT End of Session - 12/16/21 1033   ? ? Visit Number 3   ? Number of Visits 8   ? Date for PT Re-Evaluation 02/06/22   ? PT Start Time 1030   ? PT Stop Time 7867   ? PT Time Calculation (min) 42 min   ? Activity Tolerance Patient tolerated treatment well   ? Behavior During Therapy [facility] for tasks assessed/performed   ? ?  ?  ? ?  ? ? ?Past Medical History:  ?Diagnosis Date  ? Arthritis   ? Asthma, chronic 06/02/2013  ? Carpal tunnel syndrome 04/17/2015  ? Bilateral  ? Depression 06/02/2013  ? DYSPNEA ON EXERTION 06/04/2009  ? Qualifier: Diagnosis of  By: Domenic Polite, MD, Phillips Hay   ? Hereditary and idiopathic peripheral neuropathy 04/17/2015  ? Hyperlipemia 06/02/2013  ? Hypertension   ? LEG PAIN, BILATERAL 06/04/2009  ? Qualifier: Diagnosis of  By: Domenic Polite, MD, Phillips Hay   ? PERCUTANEOUS TRANSLUMINAL CORONARY ANGIOPLASTY, HX OF 06/04/2009  ? Qualifier: Diagnosis of  By: Domenic Polite, MD, Phillips Hay   ? Prostate CA [facility])   ? and basal cell nose  ? ? ?Past Surgical History:  ?Procedure Laterality Date  ? CARDIAC CATHETERIZATION  1997  ? EXCISION MASS NECK Left 11/12/2020  ? Procedure: EXCISION LEFT POSTERIOR NECK MASS;  Surgeon: Coralie Keens, MD;  Location: [place];  Service: General;  Laterality: Left;  ? EYE SURGERY Bilateral 2020  ? cataracts removed  ? HERNIA REPAIR    ? HIATAL HERNIA REPAIR    ? LAPAROSCOPIC GASTRIC BANDING  2000  ? [facility] fundoplication  ? MENISCUS REPAIR Left 2012  ? ROBOT ASSISTED LAPAROSCOPIC RADICAL PROSTATECTOMY    ? ROTATOR CUFF REPAIR Right 2019  ? TOTAL KNEE ARTHROPLASTY Right 01/03/2021  ? Procedure: RIGHT TOTAL KNEE  ARTHROPLASTY;  Surgeon: Mcarthur Rossetti, MD;  [facility];  Service: Orthopedics;  Laterality: Right;  Needs RNFA  ? TOTAL KNEE ARTHROPLASTY Left 04/25/2021  ? Procedure: LEFT TOTAL KNEE ARTHROPLASTY;  Surgeon: Mcarthur Rossetti, MD;  [facility];  Service: Orthopedics;  Laterality: Left;  ? ? ?There were no vitals filed for this visit. ? ? Subjective Assessment - 12/16/21 1032   ? ? Subjective Patient reports that he feels good today.   ? Pertinent History bilateral TKA"s, chronic LBP   ? Patient Stated Goals strengthen quads   ? Currently in Pain? No/denies   ? ?  ?  ? ?  ? ? ? ? ? ? ? ? ? ? ? ? ? ? ? ? ? ? ? ? [place] Adult PT Treatment/Exercise - 12/16/21 0001   ? ?  ? Knee/Hip Exercises: Aerobic  ? Recumbent Bike L4 x15 min   ?  ? Knee/Hip Exercises: Machines for Strengthening  ? Cybex Knee Extension 30# x 2 min   ? Cybex Knee Flexion 40# x 3 min   ? Cybex Leg Press 2 pl, seat 9 x3 min   ?  ? Knee/Hip Exercises: Standing  ? Heel Raises Both;Other (comment)   with toe raises 1 minute  ? Forward Lunges Both   1 minute each  ? Hip Abduction Knee  straight;Both   1 minute each  ? Abduction Limitations green t-band at ankles   ? Other Standing Knee Exercises Eccentric heel taps   6" step; 30 seconds each (with BLE)  ? ?  ?  ? ?  ? ? ? ? ? ? ? ? ? ? ? ? ? ? ? PT Long Term Goals - 12/09/21 1626   ? ?  ? PT LONG TERM GOAL #1  ? Title Patient will be independent with his HEP.   ? Time 4   ? Period Weeks   ? Status New   ? Target Date 01/06/22   ?  ? PT LONG TERM GOAL #2  ? Title Patient will improve his five time sit to stand to 12 seconds or less for improved lower extremity power.   ? Time 4   ? Period Weeks   ? Status New   ? Target Date 01/06/22   ?  ? PT LONG TERM GOAL #3  ? Title Patient will improve his bilateral hamstring strength to 5/5 for improved lower extremity strength.   ? Time 4   ? Period Weeks   ? Status New   ? Target Date 01/06/22   ? ?  ?  ? ?  ? ? ? ? ? ? ? ? Plan - 12/16/21 1033    ? ? Clinical Impression Statement Patient was progressed with familiar interventions for improved lower extremity strength. He required minimal cuing with eccentric heel taps for improved eccentric quadriceps control. Fatigue was his primary limitations with today's interventions. He reported feeling good upon the conclusion of treatment. He continues to require skilled physical therapy to address his remaining impairments to maximize his functional mobility.   ? Personal Factors and Comorbidities Comorbidity 3+;Time since onset of injury/illness/exacerbation   ? Comorbidities HTN, asthma, and OA   ? Examination-Activity Limitations Squat   ? Examination-Participation Restrictions Other   ? Stability/Clinical Decision Making Stable/Uncomplicated   ? Rehab Potential Good   ? PT Frequency 2x / week   ? PT Duration 4 weeks   ? PT Treatment/Interventions Neuromuscular re-education;Balance training;Therapeutic exercise;Patient/family education;Therapeutic activities;Functional mobility training   ? PT Next Visit Plan recumbent bike, lower extremity strengthening,, and balance interventions   ? Consulted and Agree with Plan of Care Patient   ? ?  ?  ? ?  ? ? ?Patient will benefit from skilled therapeutic intervention in order to improve the following deficits and impairments:  Decreased balance, Impaired sensation, Decreased strength ? ?Visit Diagnosis: ?Muscle weakness (generalized) ? ? ? ? ?Problem List ?Patient Active Problem List  ? Diagnosis Date Noted  ? Status post total left knee replacement 04/25/2021  ? Status post total right knee replacement 01/03/2021  ? Diverticular disease of colon 09/25/2020  ? Statin intolerance 12/27/2018  ? BMI 28.0-28.9,adult 11/01/2018  ? H/O arthroscopy of shoulder 02/10/2018  ? Primary osteoarthritis of left knee 11/25/2016  ? Primary osteoarthritis of right knee 11/25/2016  ? Essential hypertension 06/28/2015  ? H/O prostate cancer 06/28/2015  ? Hereditary and idiopathic  peripheral neuropathy 04/17/2015  ? Carpal tunnel syndrome 04/17/2015  ? Hyperlipemia 06/02/2013  ? Asthma, chronic 06/02/2013  ? Depression 06/02/2013  ? GERD 06/04/2009  ? PERCUTANEOUS TRANSLUMINAL CORONARY ANGIOPLASTY, HX OF 06/04/2009  ? ? ?Darlin Coco, PT ?12/16/2021, 11:14 AM ? ?[place] ?[facility] ?[place] ?[place], [place], [address] ?Phone: [phone]   Fax:  [phone] ? ?Name: [name] [name] ?MRN: [id number] ?Date of  Birth: 1946-04-06 ? ? ? ?

## 2021-12-18 ENCOUNTER — Ambulatory Visit: Payer: Medicare Other

## 2021-12-18 DIAGNOSIS — M6281 Muscle weakness (generalized): Secondary | ICD-10-CM

## 2021-12-18 NOTE — Therapy (Signed)
[place] ?[facility] ?[address] ?[place], [place], [address] ?Phone: [phone]   Fax:  [phone] ? ?Physical Therapy Treatment ? ?Patient Details  ?Name: [name] [name] ?MRN: [id number] ?Date of Birth: [birth year] ?Referring Provider (PT): Clarck, PA-C ? ? ?Encounter Date: 12/18/2021 ? ? PT End of Session - 12/18/21 1034   ? ? Visit Number 4   ? Number of Visits 8   ? Date for PT Re-Evaluation 02/06/22   ? PT Start Time 1030   ? PT Stop Time 1111   ? PT Time Calculation (min) 41 min   ? Activity Tolerance Patient tolerated treatment well   ? Behavior During Therapy [facility] for tasks assessed/performed   ? ?  ?  ? ?  ? ? ?Past Medical History:  ?Diagnosis Date  ? Arthritis   ? Asthma, chronic 06/02/2013  ? Carpal tunnel syndrome 04/17/2015  ? Bilateral  ? Depression 06/02/2013  ? DYSPNEA ON EXERTION 06/04/2009  ? Qualifier: Diagnosis of  By: Domenic Polite, MD, Phillips Hay   ? Hereditary and idiopathic peripheral neuropathy 04/17/2015  ? Hyperlipemia 06/02/2013  ? Hypertension   ? LEG PAIN, BILATERAL 06/04/2009  ? Qualifier: Diagnosis of  By: Domenic Polite, MD, Phillips Hay   ? PERCUTANEOUS TRANSLUMINAL CORONARY ANGIOPLASTY, HX OF 06/04/2009  ? Qualifier: Diagnosis of  By: Domenic Polite, MD, Phillips Hay   ? Prostate CA [facility])   ? and basal cell nose  ? ? ?Past Surgical History:  ?Procedure Laterality Date  ? CARDIAC CATHETERIZATION  1997  ? EXCISION MASS NECK Left 11/12/2020  ? Procedure: EXCISION LEFT POSTERIOR NECK MASS;  Surgeon: Coralie Keens, MD;  Location: [place];  Service: General;  Laterality: Left;  ? EYE SURGERY Bilateral 2020  ? cataracts removed  ? HERNIA REPAIR    ? HIATAL HERNIA REPAIR    ? LAPAROSCOPIC GASTRIC BANDING  2000  ? [facility] fundoplication  ? MENISCUS REPAIR Left 2012  ? ROBOT ASSISTED LAPAROSCOPIC RADICAL PROSTATECTOMY    ? ROTATOR CUFF REPAIR Right 2019  ? TOTAL KNEE ARTHROPLASTY Right 01/03/2021  ? Procedure: RIGHT TOTAL KNEE  ARTHROPLASTY;  Surgeon: Mcarthur Rossetti, MD;  [facility];  Service: Orthopedics;  Laterality: Right;  Needs RNFA  ? TOTAL KNEE ARTHROPLASTY Left 04/25/2021  ? Procedure: LEFT TOTAL KNEE ARTHROPLASTY;  Surgeon: Mcarthur Rossetti, MD;  [facility];  Service: Orthopedics;  Laterality: Left;  ? ? ?There were no vitals filed for this visit. ? ? Subjective Assessment - 12/18/21 1032   ? ? Subjective Patient reports that his last appointment was "a good workout." He was a little stiff yesterday and maybe a little this morning.   ? Pertinent History bilateral TKA"s, chronic LBP   ? Patient Stated Goals strengthen quads   ? Currently in Pain? No/denies   ? ?  ?  ? ?  ? ? ? ? ? ? ? ? ? ? ? ? ? ? ? ? ? ? ? ? [place] Adult PT Treatment/Exercise - 12/18/21 0001   ? ?  ? Knee/Hip Exercises: Aerobic  ? Recumbent Bike L4 x15 min   ?  ? Knee/Hip Exercises: Machines for Strengthening  ? Cybex Knee Extension 30# x 2 x 1.5 minutes   ? Cybex Knee Flexion 40# x 3 min   ? Cybex Leg Press 2 pl, seat 9 x3 min   ?  ? Knee/Hip Exercises: Standing  ? Hip Abduction Knee straight;Both   1.5 minutes each  ?  Abduction Limitations green t-band at ankles   ? Rocker Board 4 minutes   ? ?  ?  ? ?  ? ? ? ? ? ? ? ? ? ? ? ? ? ? ? PT Long Term Goals - 12/09/21 1626   ? ?  ? PT LONG TERM GOAL #1  ? Title Patient will be independent with his HEP.   ? Time 4   ? Period Weeks   ? Status New   ? Target Date 01/06/22   ?  ? PT LONG TERM GOAL #2  ? Title Patient will improve his five time sit to stand to 12 seconds or less for improved lower extremity power.   ? Time 4   ? Period Weeks   ? Status New   ? Target Date 01/06/22   ?  ? PT LONG TERM GOAL #3  ? Title Patient will improve his bilateral hamstring strength to 5/5 for improved lower extremity strength.   ? Time 4   ? Period Weeks   ? Status New   ? Target Date 01/06/22   ? ?  ?  ? ?  ? ? ? ? ? ? ? ? Plan - 12/18/21 1034   ? ? Clinical Impression Statement Treatment focused on familiar  interventions for improved lower extremity strength. Fatigue was his primary limitation with today's interventions. He required minimal cuing with pacing for today's interventions to avoid a significant increase in lower extremity fatigue. He reported feeling tired upon the conclusion of treatment. He continues to require skilled physical therapy to address his remaining impairments to maximize his functional mobility.   ? Personal Factors and Comorbidities Comorbidity 3+;Time since onset of injury/illness/exacerbation   ? Comorbidities HTN, asthma, and OA   ? Examination-Activity Limitations Squat   ? Examination-Participation Restrictions Other   ? Stability/Clinical Decision Making Stable/Uncomplicated   ? Rehab Potential Good   ? PT Frequency 2x / week   ? PT Duration 4 weeks   ? PT Treatment/Interventions Neuromuscular re-education;Balance training;Therapeutic exercise;Patient/family education;Therapeutic activities;Functional mobility training   ? PT Next Visit Plan recumbent bike, lower extremity strengthening,, and balance interventions   ? Consulted and Agree with Plan of Care Patient   ? ?  ?  ? ?  ? ? ?Patient will benefit from skilled therapeutic intervention in order to improve the following deficits and impairments:  Decreased balance, Impaired sensation, Decreased strength ? ?Visit Diagnosis: ?Muscle weakness (generalized) ? ? ? ? ?Problem List ?Patient Active Problem List  ? Diagnosis Date Noted  ? Status post total left knee replacement 04/25/2021  ? Status post total right knee replacement 01/03/2021  ? Diverticular disease of colon 09/25/2020  ? Statin intolerance 12/27/2018  ? BMI 28.0-28.9,adult 11/01/2018  ? H/O arthroscopy of shoulder 02/10/2018  ? Primary osteoarthritis of left knee 11/25/2016  ? Primary osteoarthritis of right knee 11/25/2016  ? Essential hypertension 06/28/2015  ? H/O prostate cancer 06/28/2015  ? Hereditary and idiopathic peripheral neuropathy 04/17/2015  ? Carpal tunnel  syndrome 04/17/2015  ? Hyperlipemia 06/02/2013  ? Asthma, chronic 06/02/2013  ? Depression 06/02/2013  ? GERD 06/04/2009  ? PERCUTANEOUS TRANSLUMINAL CORONARY ANGIOPLASTY, HX OF 06/04/2009  ? ? ?Darlin Coco, PT ?12/18/2021, 12:58 PM ? ?[place] ?[facility] ?[address] ?[place], [place], [address] ?Phone: [phone]   Fax:  [phone] ? ?Name: [name] [name] ?MRN: [id number] ?Date of Birth: 1946-05-29 ? ? ? ?

## 2021-12-22 ENCOUNTER — Ambulatory Visit: Payer: Medicare Other

## 2021-12-22 DIAGNOSIS — M6281 Muscle weakness (generalized): Secondary | ICD-10-CM | POA: Diagnosis not present

## 2021-12-22 NOTE — Therapy (Signed)
[place] ?[facility] ?[place] ?[place], [place], [address] ?Phone: [phone]   Fax:  [phone] ? ?Physical Therapy Treatment ? ?Patient Details  ?Name: [name] [name] ?MRN: [id number] ?Date of Birth: Jul 13, 1946 ?Referring Provider (PT): Clarck, PA-C ? ? ?Encounter Date: 12/22/2021 ? ? PT End of Session - 12/22/21 1036   ? ? Visit Number 5   ? Number of Visits 8   ? Date for PT Re-Evaluation 02/06/22   ? PT Start Time 1032   ? PT Stop Time 1113   ? PT Time Calculation (min) 41 min   ? Activity Tolerance Patient tolerated treatment well   ? Behavior During Therapy [facility] for tasks assessed/performed   ? ?  ?  ? ?  ? ? ?Past Medical History:  ?Diagnosis Date  ? Arthritis   ? Asthma, chronic 06/02/2013  ? Carpal tunnel syndrome 04/17/2015  ? Bilateral  ? Depression 06/02/2013  ? DYSPNEA ON EXERTION 06/04/2009  ? Qualifier: Diagnosis of  By: Domenic Polite, MD, Phillips Hay   ? Hereditary and idiopathic peripheral neuropathy 04/17/2015  ? Hyperlipemia 06/02/2013  ? Hypertension   ? LEG PAIN, BILATERAL 06/04/2009  ? Qualifier: Diagnosis of  By: Domenic Polite, MD, Phillips Hay   ? PERCUTANEOUS TRANSLUMINAL CORONARY ANGIOPLASTY, HX OF 06/04/2009  ? Qualifier: Diagnosis of  By: Domenic Polite, MD, Phillips Hay   ? Prostate CA [facility])   ? and basal cell nose  ? ? ?Past Surgical History:  ?Procedure Laterality Date  ? CARDIAC CATHETERIZATION  1997  ? EXCISION MASS NECK Left 11/12/2020  ? Procedure: EXCISION LEFT POSTERIOR NECK MASS;  Surgeon: Coralie Keens, MD;  Location: [facility];  Service: General;  Laterality: Left;  ? EYE SURGERY Bilateral 2020  ? cataracts removed  ? HERNIA REPAIR    ? HIATAL HERNIA REPAIR    ? LAPAROSCOPIC GASTRIC BANDING  2000  ? [facility] fundoplication  ? MENISCUS REPAIR Left 2012  ? ROBOT ASSISTED LAPAROSCOPIC RADICAL PROSTATECTOMY    ? ROTATOR CUFF REPAIR Right 2019  ? TOTAL KNEE ARTHROPLASTY Right 01/03/2021  ? Procedure: RIGHT TOTAL KNEE  ARTHROPLASTY;  Surgeon: Mcarthur Rossetti, MD;  [facility];  Service: Orthopedics;  Laterality: Right;  Needs RNFA  ? TOTAL KNEE ARTHROPLASTY Left 04/25/2021  ? Procedure: LEFT TOTAL KNEE ARTHROPLASTY;  Surgeon: Mcarthur Rossetti, MD;  [facility];  Service: Orthopedics;  Laterality: Left;  ? ? ?There were no vitals filed for this visit. ? ? Subjective Assessment - 12/22/21 1033   ? ? Subjective Patient reports that he feels alright today. He notes that his calves were a little sore   ? Pertinent History bilateral TKA"s, chronic LBP   ? Patient Stated Goals strengthen quads   ? Currently in Pain? No/denies   ? ?  ?  ? ?  ? ? ? ? ? ? ? ? ? ? ? ? ? ? ? ? ? ? ? ? [facility] Adult PT Treatment/Exercise - 12/22/21 0001   ? ?  ? Knee/Hip Exercises: Aerobic  ? Recumbent Bike L4 x15 min   ?  ? Knee/Hip Exercises: Machines for Strengthening  ? Cybex Knee Extension 60# x 2 x  30 seconds; 40# x 4  x 45 seconds   ? Cybex Knee Flexion 60# x 3 minutes   ? Cybex Leg Press 5 pl (12 reps) and 8 pl (4x 10 reps); seat 9   ?  ? Knee/Hip Exercises: Standing  ? Rocker  Board 5 minutes   ? ?  ?  ? ?  ? ? ? ? ? ? ? ? ? ? ? ? ? ? ? PT Long Term Goals - 12/09/21 1626   ? ?  ? PT LONG TERM GOAL #1  ? Title Patient will be independent with his HEP.   ? Time 4   ? Period Weeks   ? Status New   ? Target Date 01/06/22   ?  ? PT LONG TERM GOAL #2  ? Title Patient will improve his five time sit to stand to 12 seconds or less for improved lower extremity power.   ? Time 4   ? Period Weeks   ? Status New   ? Target Date 01/06/22   ?  ? PT LONG TERM GOAL #3  ? Title Patient will improve his bilateral hamstring strength to 5/5 for improved lower extremity strength.   ? Time 4   ? Period Weeks   ? Status New   ? Target Date 01/06/22   ? ?  ?  ? ?  ? ? ? ? ? ? ? ? Plan - 12/22/21 1036   ? ? Clinical Impression Statement Patient was progressed with familiar interventions for improved lower extremity strength with moderate difficulty and  fatigue. He required minimal cuing with today's interventions for proper pacing to avoid a significant increase in fatigue. He reported feeling good upon the conclusion of treatment. He continues to require skilled physical therapy to address his remaining impairments to maximize his functional mobility.   ? Personal Factors and Comorbidities Comorbidity 3+;Time since onset of injury/illness/exacerbation   ? Comorbidities HTN, asthma, and OA   ? Examination-Activity Limitations Squat   ? Examination-Participation Restrictions Other   ? Stability/Clinical Decision Making Stable/Uncomplicated   ? Rehab Potential Good   ? PT Frequency 2x / week   ? PT Duration 4 weeks   ? PT Treatment/Interventions Neuromuscular re-education;Balance training;Therapeutic exercise;Patient/family education;Therapeutic activities;Functional mobility training   ? PT Next Visit Plan recumbent bike, lower extremity strengthening,, and balance interventions   ? Consulted and Agree with Plan of Care Patient   ? ?  ?  ? ?  ? ? ?Patient will benefit from skilled therapeutic intervention in order to improve the following deficits and impairments:  Decreased balance, Impaired sensation, Decreased strength ? ?Visit Diagnosis: ?Muscle weakness (generalized) ? ? ? ? ?Problem List ?Patient Active Problem List  ? Diagnosis Date Noted  ? Status post total left knee replacement 04/25/2021  ? Status post total right knee replacement 01/03/2021  ? Diverticular disease of colon 09/25/2020  ? Statin intolerance 12/27/2018  ? BMI 28.0-28.9,adult 11/01/2018  ? H/O arthroscopy of shoulder 02/10/2018  ? Primary osteoarthritis of left knee 11/25/2016  ? Primary osteoarthritis of right knee 11/25/2016  ? Essential hypertension 06/28/2015  ? H/O prostate cancer 06/28/2015  ? Hereditary and idiopathic peripheral neuropathy 04/17/2015  ? Carpal tunnel syndrome 04/17/2015  ? Hyperlipemia 06/02/2013  ? Asthma, chronic 06/02/2013  ? Depression 06/02/2013  ? GERD  06/04/2009  ? PERCUTANEOUS TRANSLUMINAL CORONARY ANGIOPLASTY, HX OF 06/04/2009  ? ? ?Darlin Coco, PT ?12/22/2021, 12:14 PM ? ?[place] ?[facility] ?[place] ?[place], [place], [address] ?Phone: [phone]   Fax:  [phone] ? ?Name: [name] [name] ?MRN: [id number] ?Date of Birth: 1946-06-26 ? ? ? ?

## 2021-12-25 ENCOUNTER — Ambulatory Visit: Payer: Medicare Other | Admitting: Physical Therapy

## 2021-12-30 ENCOUNTER — Ambulatory Visit: Payer: Medicare Other

## 2021-12-30 DIAGNOSIS — M6281 Muscle weakness (generalized): Secondary | ICD-10-CM

## 2021-12-30 NOTE — Therapy (Signed)
[place] ?[facility] ?[place] ?[place], [place], [address] ?Phone: [phone]   Fax:  [phone] ? ?Physical Therapy Treatment ? ?Patient Details  ?Name: [name] [name] ?MRN: [id number] ?Date of Birth: 1946-07-16 ?Referring Provider (PT): Clarck, PA-C ? ? ?Encounter Date: 12/30/2021 ? ? PT End of Session - 12/30/21 1041   ? ? Visit Number 6   ? Number of Visits 8   ? Date for PT Re-Evaluation 02/06/22   ? PT Start Time 1032   ? PT Stop Time 1118   ? PT Time Calculation (min) 46 min   ? Activity Tolerance Patient tolerated treatment well   ? Behavior During Therapy [facility] for tasks assessed/performed   ? ?  ?  ? ?  ? ? ?Past Medical History:  ?Diagnosis Date  ? Arthritis   ? Asthma, chronic 06/02/2013  ? Carpal tunnel syndrome 04/17/2015  ? Bilateral  ? Depression 06/02/2013  ? DYSPNEA ON EXERTION 06/04/2009  ? Qualifier: Diagnosis of  By: Domenic Polite, MD, Phillips Hay   ? Hereditary and idiopathic peripheral neuropathy 04/17/2015  ? Hyperlipemia 06/02/2013  ? Hypertension   ? LEG PAIN, BILATERAL 06/04/2009  ? Qualifier: Diagnosis of  By: Domenic Polite, MD, Phillips Hay   ? PERCUTANEOUS TRANSLUMINAL CORONARY ANGIOPLASTY, HX OF 06/04/2009  ? Qualifier: Diagnosis of  By: Domenic Polite, MD, Phillips Hay   ? Prostate CA [facility])   ? and basal cell nose  ? ? ?Past Surgical History:  ?Procedure Laterality Date  ? CARDIAC CATHETERIZATION  1997  ? EXCISION MASS NECK Left 11/12/2020  ? Procedure: EXCISION LEFT POSTERIOR NECK MASS;  Surgeon: Coralie Keens, MD;  Location: [facility];  Service: General;  Laterality: Left;  ? EYE SURGERY Bilateral 2020  ? cataracts removed  ? HERNIA REPAIR    ? HIATAL HERNIA REPAIR    ? LAPAROSCOPIC GASTRIC BANDING  2000  ? [facility] fundoplication  ? MENISCUS REPAIR Left 2012  ? ROBOT ASSISTED LAPAROSCOPIC RADICAL PROSTATECTOMY    ? ROTATOR CUFF REPAIR Right 2019  ? TOTAL KNEE ARTHROPLASTY Right 01/03/2021  ? Procedure: RIGHT TOTAL KNEE  ARTHROPLASTY;  Surgeon: Mcarthur Rossetti, MD;  [facility];  Service: Orthopedics;  Laterality: Right;  Needs RNFA  ? TOTAL KNEE ARTHROPLASTY Left 04/25/2021  ? Procedure: LEFT TOTAL KNEE ARTHROPLASTY;  Surgeon: Mcarthur Rossetti, MD;  [facility];  Service: Orthopedics;  Laterality: Left;  ? ? ?There were no vitals filed for this visit. ? ? Subjective Assessment - 12/30/21 1033   ? ? Subjective Patient reports that he feels "peachy-keen" today. He notes that he is a little tired from working yesterday.   ? Pertinent History bilateral TKA"s, chronic LBP   ? Patient Stated Goals strengthen quads   ? Currently in Pain? No/denies   ? ?  ?  ? ?  ? ? ? ? ? ? ? ? ? ? ? ? ? ? ? ? ? ? ? ? [name] [name] Adult PT Treatment/Exercise - 12/30/21 0001   ? ?  ? Knee/Hip Exercises: Aerobic  ? Recumbent Bike L4 x15 min   ?  ? Knee/Hip Exercises: Machines for Strengthening  ? Cybex Knee Extension 60# 6 x 35 seconds   ? Cybex Knee Flexion 70# 6 x 45 seconds   ? Cybex Leg Press 6 pl; seat 9; 4 x 45 seconds   ?  ? Knee/Hip Exercises: Standing  ? Rocker Board 5 minutes   ? ?  ?  ? ?  ? ? ? ? ? ? ? ? ? ? ? ? ? ? ?  PT Long Term Goals - 12/30/21 1128   ? ?  ? PT LONG TERM GOAL #1  ? Title Patient will be independent with his HEP.   ? Time 4   ? Period Weeks   ? Status Achieved   ? Target Date 01/06/22   ?  ? PT LONG TERM GOAL #2  ? Title Patient will improve his five time sit to stand to 12 seconds or less for improved lower extremity power.   ? Time 4   ? Period Weeks   ? Status On-going   ? Target Date 01/06/22   ?  ? PT LONG TERM GOAL #3  ? Title Patient will improve his bilateral hamstring strength to 5/5 for improved lower extremity strength.   ? Time 4   ? Period Weeks   ? Status On-going   ? Target Date 01/06/22   ? ?  ?  ? ?  ? ? ? ? ? ? ? ? Plan - 12/30/21 1042   ? ? Clinical Impression Statement Patient was progressed with familiar interventions with moderate lower extremity fatigue. Fatigue was his primary limitation  with today's interventions as he required multiple rest breaks throughout treatment. He reported feeling tired upon the conclusion of treatment. He continues to require skilled physical therapy to address his remaining impairments to maximize his functional mobility.   ? Personal Factors and Comorbidities Comorbidity 3+;Time since onset of injury/illness/exacerbation   ? Comorbidities HTN, asthma, and OA   ? Examination-Activity Limitations Squat   ? Examination-Participation Restrictions Other   ? Stability/Clinical Decision Making Stable/Uncomplicated   ? Rehab Potential Good   ? PT Frequency 2x / week   ? PT Duration 4 weeks   ? PT Treatment/Interventions Neuromuscular re-education;Balance training;Therapeutic exercise;Patient/family education;Therapeutic activities;Functional mobility training   ? PT Next Visit Plan recumbent bike, lower extremity strengthening,, and balance interventions   ? Consulted and Agree with Plan of Care Patient   ? ?  ?  ? ?  ? ? ?Patient will benefit from skilled therapeutic intervention in order to improve the following deficits and impairments:  Decreased balance, Impaired sensation, Decreased strength ? ?Visit Diagnosis: ?Muscle weakness (generalized) ? ? ? ? ?Problem List ?Patient Active Problem List  ? Diagnosis Date Noted  ? Status post total left knee replacement 04/25/2021  ? Status post total right knee replacement 01/03/2021  ? Diverticular disease of colon 09/25/2020  ? Statin intolerance 12/27/2018  ? BMI 28.0-28.9,adult 11/01/2018  ? H/O arthroscopy of shoulder 02/10/2018  ? Primary osteoarthritis of left knee 11/25/2016  ? Primary osteoarthritis of right knee 11/25/2016  ? Essential hypertension 06/28/2015  ? H/O prostate cancer 06/28/2015  ? Hereditary and idiopathic peripheral neuropathy 04/17/2015  ? Carpal tunnel syndrome 04/17/2015  ? Hyperlipemia 06/02/2013  ? Asthma, chronic 06/02/2013  ? Depression 06/02/2013  ? GERD 06/04/2009  ? PERCUTANEOUS TRANSLUMINAL  CORONARY ANGIOPLASTY, HX OF 06/04/2009  ? ? ?Darlin Coco, PT ?12/30/2021, 12:11 PM ? ?[place] ?[facility] ?[place] ?[place], [place], [address] ?Phone: [phone]   Fax:  [phone] ? ?Name: [name] [name] ?MRN: [id number] ?Date of Birth: 1946-01-24 ? ? ? ?

## 2021-12-31 ENCOUNTER — Other Ambulatory Visit: Payer: Self-pay | Admitting: Nurse Practitioner

## 2021-12-31 MED ORDER — NAPROXEN 500 MG PO TABS: 500.0000 mg | ORAL_TABLET | Freq: Every day | ORAL | 0 refills | Status: DC

## 2021-12-31 NOTE — Telephone Encounter (Signed)
?  Prescription Request ? ?12/31/2021 ? ?Is this a "Controlled Substance" medicine? no ? ?Have you seen your PCP in the last 2 weeks? no ? ?If YES, route message to pool  -  If NO, patient needs to be scheduled for appointment. ? ?What is the name of the medication or equipment? naproxen (NAPROSYN) 500 MG tablet ? ?Have you contacted your pharmacy to request a refill? Wants sent to different pharmacy and she said they told her to call here to request the switch   ? ?Which pharmacy would you like this sent to? [facility] ? ? ?Patient notified that their request is being sent to the clinical staff for review and that they should receive a response within 2 business days.  ?  ?

## 2021-12-31 NOTE — Telephone Encounter (Signed)
LMOVM refill sent to pharmacy 

## 2022-01-01 ENCOUNTER — Ambulatory Visit: Payer: Medicare Other

## 2022-01-01 DIAGNOSIS — M6281 Muscle weakness (generalized): Secondary | ICD-10-CM

## 2022-01-01 NOTE — Therapy (Signed)
?[facility] ?[place] ?[place], [place], [address] ?Phone: [phone]   Fax:  [phone] ? ?Physical Therapy Treatment ? ?Patient Details  ?Name: [name] [name] ?MRN: [id number] ?Date of Birth: [birth year] ?Referring Provider (PT): Clarck, PA-C ? ? ?Encounter Date: 01/01/2022 ? ? PT End of Session - 01/01/22 1040   ? ? Visit Number 7   ? Number of Visits 8   ? Date for PT Re-Evaluation 02/06/22   ? PT Start Time 1033   ? PT Stop Time 6962   ? PT Time Calculation (min) 36 min   ? Activity Tolerance Patient tolerated treatment well   ? Behavior During Therapy [facility] for tasks assessed/performed   ? ?  ?  ? ?  ? ? ?Past Medical History:  ?Diagnosis Date  ? Arthritis   ? Asthma, chronic 06/02/2013  ? Carpal tunnel syndrome 04/17/2015  ? Bilateral  ? Depression 06/02/2013  ? DYSPNEA ON EXERTION 06/04/2009  ? Qualifier: Diagnosis of  By: Domenic Polite, MD, Phillips Hay   ? Hereditary and idiopathic peripheral neuropathy 04/17/2015  ? Hyperlipemia 06/02/2013  ? Hypertension   ? LEG PAIN, BILATERAL 06/04/2009  ? Qualifier: Diagnosis of  By: Domenic Polite, MD, Phillips Hay   ? PERCUTANEOUS TRANSLUMINAL CORONARY ANGIOPLASTY, HX OF 06/04/2009  ? Qualifier: Diagnosis of  By: Domenic Polite, MD, Phillips Hay   ? Prostate CA [facility])   ? and basal cell nose  ? ? ?Past Surgical History:  ?Procedure Laterality Date  ? CARDIAC CATHETERIZATION  1997  ? EXCISION MASS NECK Left 11/12/2020  ? Procedure: EXCISION LEFT POSTERIOR NECK MASS;  Surgeon: Coralie Keens, MD;  Location: [facility];  Service: General;  Laterality: Left;  ? EYE SURGERY Bilateral 2020  ? cataracts removed  ? HERNIA REPAIR    ? HIATAL HERNIA REPAIR    ? LAPAROSCOPIC GASTRIC BANDING  2000  ? [facility] fundoplication  ? MENISCUS REPAIR Left 2012  ? ROBOT ASSISTED LAPAROSCOPIC RADICAL PROSTATECTOMY    ? ROTATOR CUFF REPAIR Right 2019  ? TOTAL KNEE ARTHROPLASTY Right 01/03/2021  ? Procedure: RIGHT TOTAL KNEE  ARTHROPLASTY;  Surgeon: Mcarthur Rossetti, MD;  [facility];  Service: Orthopedics;  Laterality: Right;  Needs RNFA  ? TOTAL KNEE ARTHROPLASTY Left 04/25/2021  ? Procedure: LEFT TOTAL KNEE ARTHROPLASTY;  Surgeon: Mcarthur Rossetti, MD;  [facility];  Service: Orthopedics;  Laterality: Left;  ? ? ?There were no vitals filed for this visit. ? ? Subjective Assessment - 01/01/22 1040   ? ? Subjective Patient reports that he feels better than he did at his last appointment. He notes that his body was aching at his last appointment, but it is still aching today. He reported feeling comfortable being discharged today.   ? Pertinent History bilateral TKA"s, chronic LBP   ? Patient Stated Goals strengthen quads   ? Currently in Pain? No/denies   ? ?  ?  ? ?  ? ? ? ? ? [facility] PT Assessment - 01/01/22 0001   ? ?  ? Transfers  ? Five time sit to stand comments  14 seconds   ? ?  ?  ? ?  ? ? ? ? ? ? ? ? ? ? ? ? ? ? ? ? [facility] Adult PT Treatment/Exercise - 01/01/22 0001   ? ?  ? Knee/Hip Exercises: Aerobic  ? Recumbent Bike L4 x15 min   ?  ? Knee/Hip Exercises: Machines for Strengthening  ?  Cybex Knee Extension 50# @ 3x 1 minutes   ? Cybex Knee Flexion 60#; 2x 1.5 minutes   ? Cybex Leg Press 5 pl; seat 9; 2 x 1.5 minutes   ? ?  ?  ? ?  ? ? ? ? ? ? ? ? ? ? ? ? ? ? ? PT Long Term Goals - 01/01/22 1106   ? ?  ? PT LONG TERM GOAL #1  ? Title Patient will be independent with his HEP.   ? Time 4   ? Period Weeks   ? Status Achieved   ? Target Date 01/06/22   ?  ? PT LONG TERM GOAL #2  ? Title Patient will improve his five time sit to stand to 12 seconds or less for improved lower extremity power.   ? Baseline 14 seconds   ? Time 4   ? Period Weeks   ? Status Not Met   ? Target Date 01/06/22   ?  ? PT LONG TERM GOAL #3  ? Title Patient will improve his bilateral hamstring strength to 5/5 for improved lower extremity strength.   ? Time 4   ? Period Weeks   ? Status Achieved   ? Target Date 01/06/22   ? ?  ?  ? ?   ? ? ? ? ? ? ? ? Plan - 01/01/22 1040   ? ? Clinical Impression Statement Patient presented to treatment reported feeling comfortable being discharged from physical therapy at this time. Treatment focused on familiar interventions for improved lower extremity strength. He reported feeling comfortable with his progress with physical therapy and with his HEP.   ? Personal Factors and Comorbidities Comorbidity 3+;Time since onset of injury/illness/exacerbation   ? Comorbidities HTN, asthma, and OA   ? Examination-Activity Limitations Squat   ? Examination-Participation Restrictions Other   ? Stability/Clinical Decision Making Stable/Uncomplicated   ? Rehab Potential Good   ? PT Frequency 2x / week   ? PT Duration 4 weeks   ? PT Treatment/Interventions Neuromuscular re-education;Balance training;Therapeutic exercise;Patient/family education;Therapeutic activities;Functional mobility training   ? PT Next Visit Plan recumbent bike, lower extremity strengthening,, and balance interventions   ? Consulted and Agree with Plan of Care Patient   ? ?  ?  ? ?  ? ? ?Patient will benefit from skilled therapeutic intervention in order to improve the following deficits and impairments:  Decreased balance, Impaired sensation, Decreased strength ? ?Visit Diagnosis: ?Muscle weakness (generalized) ? ? ? ? ?Problem List ?Patient Active Problem List  ? Diagnosis Date Noted  ? Status post total left knee replacement 04/25/2021  ? Status post total right knee replacement 01/03/2021  ? Diverticular disease of colon 09/25/2020  ? Statin intolerance 12/27/2018  ? BMI 28.0-28.9,adult 11/01/2018  ? H/O arthroscopy of shoulder 02/10/2018  ? Primary osteoarthritis of left knee 11/25/2016  ? Primary osteoarthritis of right knee 11/25/2016  ? Essential hypertension 06/28/2015  ? H/O prostate cancer 06/28/2015  ? Hereditary and idiopathic peripheral neuropathy 04/17/2015  ? Carpal tunnel syndrome 04/17/2015  ? Hyperlipemia 06/02/2013  ? Asthma,  chronic 06/02/2013  ? Depression 06/02/2013  ? GERD 06/04/2009  ? PERCUTANEOUS TRANSLUMINAL CORONARY ANGIOPLASTY, HX OF 06/04/2009  ? ? ?Darlin Coco, PT ?01/01/2022, 11:11 AM ? ?[place] ?[facility] ?[place] ?[place], [place], [address] ?Phone: [phone]   Fax:  [phone] ? ?Name: [name] [name] ?MRN: [id number] ?Date of Birth: 10/03/1945 ? ?PHYSICAL THERAPY DISCHARGE SUMMARY ? ?Visits from Start of  Care: 7 ? ?Current functional level related to goals / functional outcomes: ?Patient was able to meet most of his goals for physical therapy and he reported feeling comfortable with his progress and being discharged at this time.  ?  ?Remaining deficits: ?Lower extremity power  ?  ?Education / Equipment: ?HEP   ? ?Patient agrees to discharge. Patient goals were partially met. Patient is being discharged due to being pleased with the current functional level.  ? ?

## 2022-01-20 ENCOUNTER — Ambulatory Visit (INDEPENDENT_AMBULATORY_CARE_PROVIDER_SITE_OTHER): Payer: Medicare Other

## 2022-01-20 ENCOUNTER — Ambulatory Visit (INDEPENDENT_AMBULATORY_CARE_PROVIDER_SITE_OTHER): Payer: Medicare Other | Admitting: Orthopedic Surgery

## 2022-01-20 VITALS — BP 129/73 | HR 65 | Ht 74.0 in | Wt 259.0 lb

## 2022-01-20 DIAGNOSIS — M151 Heberden's nodes (with arthropathy): Secondary | ICD-10-CM | POA: Diagnosis not present

## 2022-01-20 DIAGNOSIS — R2232 Localized swelling, mass and lump, left upper limb: Secondary | ICD-10-CM

## 2022-01-20 NOTE — H&P (View-Only) (Signed)
Office Visit Note   Patient: [name] [name]           Date of Birth: 1945-11-08           MRN: [id number] Visit Date: 01/20/2022              Requested by: Chevis Pretty, [address] [place],  [place] [address] PCP: Chevis Pretty, FNP   Assessment & Plan: Visit Diagnoses:  1. Subcutaneous mass of left thumb   2. Distal interphalangeal nodule     Plan: We reviewed patient's x-rays today which demonstrate a large thumb dorsal distal phalangeal osteophyte/Heberden's node.  He has no pain with active or passive range of motion of the thumb IP joint.  The area around the large dorsal osteophyte is quite painful and bothersome for him.  We discussed conservative management with continued observation, oral anti-inflammatories, or corticosteroid injection.  We also discussed surgery with debridement of the large osteophyte.  After discussion, he would like to proceed with debridement of the large osteophyte.  I do not think the patient needs a thumb IP joint arthrodesis as he has no pain with range of motion of the joint the pain is isolated to the area around the large osteophyte.  We reviewed the risk of surgery including bleeding, infection, damage to the extensor tendon or nail germinal matrix, incomplete symptom relief, need for additional surgery.  After our discussion, he would like to proceed with dorsal osteophyte debridement.  A surgical date and time will be confirmed with the patient.  Follow-Up Instructions: No follow-ups on file.   Orders:  Orders Placed This Encounter  Procedures   XR Finger Thumb Left   No orders of the defined types were placed in this encounter.     Procedures: No procedures performed   Clinical Data: No additional findings.   Subjective: Chief Complaint  Patient presents with   Left Thumb - Cyst    This is a 76 year old right-hand-dominant male who presents with a mass at the dorsal and radial aspect of the left thumb  at the IP joint.  He noticed this about 2 to 4 months ago.  He thinks the lesion has enlarged over that time.  This area wakes him up at night with throbbing, 9-10 pain.  He has tried Voltaren gel which has helped some.  He is also taking naproxen.  He has no pain with range of motion of the IP joint but only in the area of the mass.   Review of Systems   Objective: Vital Signs: BP 129/73 (BP Location: Left Arm, Patient Position: Sitting)   Pulse 65   Ht '6\' 2"'$  (1.88 m)   Wt 259 lb (117.5 kg)   BMI 33.25 kg/m   Physical Exam Constitutional:      Appearance: Normal appearance.  Cardiovascular:     Rate and Rhythm: Normal rate.     Pulses: Normal pulses.  Pulmonary:     Effort: Pulmonary effort is normal.  Skin:    General: Skin is warm and dry.     Capillary Refill: Capillary refill takes less than 2 seconds.  Neurological:     Mental Status: He is alert.    Left Hand Exam   Tenderness  Left hand tenderness location: TTP directly over radial and dorsal mass at thumb IP joint.   Other  Erythema: absent Sensation: normal Pulse: present  Comments:  Firm 1.5 x 1.5, round, well circumscribed mass at dorsal and radial aspect  of thumb at IP joint.  No pain w/ active or passive ROM of IP joint.  No thumb IP joint instability.      Specialty Comments:  MRI LUMBAR SPINE WITHOUT AND WITH CONTRAST   TECHNIQUE: Multiplanar and multiecho pulse sequences of the lumbar spine were obtained without and with intravenous contrast.   CONTRAST:  32m MULTIHANCE GADOBENATE DIMEGLUMINE 529 MG/ML IV SOLN   COMPARISON:  07/21/2020   FINDINGS: Segmentation:  5 lumbar type vertebrae based on prior   Alignment:  Grade 1 anterolisthesis at L4-5, mildly progressed.   Vertebrae:  No fracture, evidence of discitis, or bone lesion.   Conus medullaris and cauda equina: Conus extends to the T12-L1 level. Conus and cauda equina appear normal.   Paraspinal and other soft tissues: Lower pole  renal cyst on the left.   Disc levels:   T12- L1: Spondylosis with ventral spur that is likely bridging   L1-L2: Fairly bulky ventral spondylitic spur.  No impingement   L2-L3: Ventral spondylitic spurring.  Mild disc bulging.   L3-L4: Disc narrowing and bulging with ventral spurring. Bilateral facet spurring and ligamentum flavum thickening. Anterior and medial synovial cyst from the left facet measuring 5 mm. No neural compression.   L4-L5: Advanced facet degeneration with spurring and anterolisthesis. Bilobed anterior synovial cyst measuring 14 mm with compression of the right L4 nerve root. Patent left foramen. Moderate spinal stenosis   L5-S1:Facet osteoarthritis with asymmetric bulky spurring. Eccentric right disc bulging and far-lateral spurring. Moderate right foraminal stenosis.   IMPRESSION: 1. Generalized lumbar spine degeneration with progression from 2021. 2. L4-5 severe facet osteoarthritis with anterolisthesis and 14 mm right synovial cyst that compresses the right L4 nerve root. Moderate spinal stenosis at this level. 3. L3-4 left facet synovial cyst which is new but noncompressive.     Electronically Signed   By: JJorje GuildM.D.   On: 10/28/2021 19:27  Imaging: No results found.   PMFS History: Patient Active Problem List   Diagnosis Date Noted   Distal interphalangeal nodule 01/20/2022   Status post total left knee replacement 04/25/2021   Status post total right knee replacement 01/03/2021   Diverticular disease of colon 09/25/2020   Statin intolerance 12/27/2018   BMI 28.0-28.9,adult 11/01/2018   H/O arthroscopy of shoulder 02/10/2018   Primary osteoarthritis of left knee 11/25/2016   Primary osteoarthritis of right knee 11/25/2016   Essential hypertension 06/28/2015   H/O prostate cancer 06/28/2015   Hereditary and idiopathic peripheral neuropathy 04/17/2015   Carpal tunnel syndrome 04/17/2015   Hyperlipemia 06/02/2013   Asthma,  chronic 06/02/2013   Depression 06/02/2013   GERD 06/04/2009   PERCUTANEOUS TRANSLUMINAL CORONARY ANGIOPLASTY, HX OF 06/04/2009   Past Medical History:  Diagnosis Date   Arthritis    Asthma, chronic 06/02/2013   Carpal tunnel syndrome 04/17/2015   Bilateral   Depression 06/02/2013   DYSPNEA ON EXERTION 06/04/2009   Qualifier: Diagnosis of  By: MDomenic Polite MD, FPhillips Hay   Hereditary and idiopathic peripheral neuropathy 04/17/2015   Hyperlipemia 06/02/2013   Hypertension    LEG PAIN, BILATERAL 06/04/2009   Qualifier: Diagnosis of  By: MDomenic Polite MD, FACC, [place] HX OF 06/04/2009   Qualifier: Diagnosis of  By: MDomenic Polite MD, FPhillips Hay   Prostate CA ([facility]    and basal cell nose    Family History  Problem Relation Age of Onset   Heart disease Other  Early Cardiac disease for uncle is their 76's as well as deceased.   Heart failure Mother    Dementia Mother 6   Cancer Father 38       testicular, lung   Esophageal cancer Brother     Past Surgical History:  Procedure Laterality Date   CARDIAC CATHETERIZATION  1997   EXCISION MASS NECK Left 11/12/2020   Procedure: EXCISION LEFT POSTERIOR NECK MASS;  Surgeon: Coralie Keens, MD;  Location: [facility];  Service: General;  Laterality: Left;   EYE SURGERY Bilateral 2020   cataracts removed   HERNIA REPAIR     HIATAL HERNIA REPAIR     LAPAROSCOPIC GASTRIC BANDING  5638   [facility] fundoplication   MENISCUS REPAIR Left 2012   ROBOT ASSISTED LAPAROSCOPIC RADICAL PROSTATECTOMY     ROTATOR CUFF REPAIR Right 2019   TOTAL KNEE ARTHROPLASTY Right 01/03/2021   Procedure: RIGHT TOTAL KNEE ARTHROPLASTY;  Surgeon: Mcarthur Rossetti, MD;  Location: [facility];  Service: Orthopedics;  Laterality: Right;  Needs RNFA   TOTAL KNEE ARTHROPLASTY Left 04/25/2021   Procedure: LEFT TOTAL KNEE ARTHROPLASTY;  Surgeon: Mcarthur Rossetti, MD;   Location: [facility];  Service: Orthopedics;  Laterality: Left;   Social History   Occupational History   Occupation: Retired    Comment: Nature conservation officer  Tobacco Use   Smoking status: Former    Packs/day: 1.00    Years: 20.00    Pack years: 20.00    Types: Cigarettes    Quit date: 01/18/1993    Years since quitting: 29.0   Smokeless tobacco: Never  Vaping Use   Vaping Use: Never used  Substance and Sexual Activity   Alcohol use: Yes    Alcohol/week: 2.0 standard drinks    Types: 2 Cans of beer per week    Comment: occasional   Drug use: No   Sexual activity: Yes

## 2022-01-20 NOTE — Progress Notes (Signed)
? ?Office Visit Note ?  ?Patient: [name] [name]           ?Date of Birth: 1946/04/28           ?MRN: [id number] ?Visit Date: 01/20/2022 ?             ?Requested by: Chevis Pretty, FNP ?[address] ?[place],  [place] [address] ?PCP: Chevis Pretty, FNP ? ? ?Assessment & Plan: ?Visit Diagnoses:  ?1. Subcutaneous mass of left thumb   ?2. Distal interphalangeal nodule   ? ? ?Plan: We reviewed patient's x-rays today which demonstrate a large thumb dorsal distal phalangeal osteophyte/Heberden's node.  He has no pain with active or passive range of motion of the thumb IP joint.  The area around the large dorsal osteophyte is quite painful and bothersome for him.  We discussed conservative management with continued observation, oral anti-inflammatories, or corticosteroid injection.  We also discussed surgery with debridement of the large osteophyte.  After discussion, he would like to proceed with debridement of the large osteophyte.  I do not think the patient needs a thumb IP joint arthrodesis as he has no pain with range of motion of the joint the pain is isolated to the area around the large osteophyte.  We reviewed the risk of surgery including bleeding, infection, damage to the extensor tendon or nail germinal matrix, incomplete symptom relief, need for additional surgery.  After our discussion, he would like to proceed with dorsal osteophyte debridement.  A surgical date and time will be confirmed with the patient. ? ?Follow-Up Instructions: No follow-ups on file.  ? ?Orders:  ?Orders Placed This Encounter  ?Procedures  ? XR Finger Thumb Left  ? ?No orders of the defined types were placed in this encounter. ? ? ? ? Procedures: ?No procedures performed ? ? ?Clinical Data: ?No additional findings. ? ? ?Subjective: ?Chief Complaint  ?Patient presents with  ? Left Thumb - Cyst  ? ? ?This is a 76 year old right-hand-dominant male who presents with a mass at the dorsal and radial aspect of the left thumb  at the IP joint.  He noticed this about 2 to 4 months ago.  He thinks the lesion has enlarged over that time.  This area wakes him up at night with throbbing, 9-10 pain.  He has tried Voltaren gel which has helped some.  He is also taking naproxen.  He has no pain with range of motion of the IP joint but only in the area of the mass. ? ? ?Review of Systems ? ? ?Objective: ?Vital Signs: BP 129/73 (BP Location: Left Arm, Patient Position: Sitting)   Pulse 65   Ht '6\' 2"'$  (1.88 m)   Wt 259 lb (117.5 kg)   BMI 33.25 kg/m?  ? ?Physical Exam ?Constitutional:   ?   Appearance: Normal appearance.  ?Cardiovascular:  ?   Rate and Rhythm: Normal rate.  ?   Pulses: Normal pulses.  ?Pulmonary:  ?   Effort: Pulmonary effort is normal.  ?Skin: ?   General: Skin is warm and dry.  ?   Capillary Refill: Capillary refill takes less than 2 seconds.  ?Neurological:  ?   Mental Status: He is alert.  ? ? ?Left Hand Exam  ? ?Tenderness  ?Left hand tenderness location: TTP directly over radial and dorsal mass at thumb IP joint.  ? ?Other  ?Erythema: absent ?Sensation: normal ?Pulse: present ? ?Comments:  Firm 1.5 x 1.5, round, well circumscribed mass at dorsal and radial aspect  of thumb at IP joint.  No pain w/ active or passive ROM of IP joint.  No thumb IP joint instability.  ? ? ? ? ?Specialty Comments:  ?MRI LUMBAR SPINE WITHOUT AND WITH CONTRAST ?  ?TECHNIQUE: ?Multiplanar and multiecho pulse sequences of the lumbar spine were ?obtained without and with intravenous contrast. ?  ?CONTRAST:  58m MULTIHANCE GADOBENATE DIMEGLUMINE 529 MG/ML IV SOLN ?  ?COMPARISON:  07/21/2020 ?  ?FINDINGS: ?Segmentation:  5 lumbar type vertebrae based on prior ?  ?Alignment:  Grade 1 anterolisthesis at L4-5, mildly progressed. ?  ?Vertebrae:  No fracture, evidence of discitis, or bone lesion. ?  ?Conus medullaris and cauda equina: Conus extends to the T12-L1 ?level. Conus and cauda equina appear normal. ?  ?Paraspinal and other soft tissues: Lower pole  renal cyst on the ?left. ?  ?Disc levels: ?  ?T12- L1: Spondylosis with ventral spur that is likely bridging ?  ?L1-L2: Fairly bulky ventral spondylitic spur.  No impingement ?  ?L2-L3: Ventral spondylitic spurring.  Mild disc bulging. ?  ?L3-L4: Disc narrowing and bulging with ventral spurring. Bilateral ?facet spurring and ligamentum flavum thickening. Anterior and medial ?synovial cyst from the left facet measuring 5 mm. No neural ?compression. ?  ?L4-L5: Advanced facet degeneration with spurring and ?anterolisthesis. Bilobed anterior synovial cyst measuring 14 mm with ?compression of the right L4 nerve root. Patent left foramen. ?Moderate spinal stenosis ?  ?L5-S1:Facet osteoarthritis with asymmetric bulky spurring. Eccentric ?right disc bulging and far-lateral spurring. Moderate right ?foraminal stenosis. ?  ?IMPRESSION: ?1. Generalized lumbar spine degeneration with progression from 2021. ?2. L4-5 severe facet osteoarthritis with anterolisthesis and 14 mm ?right synovial cyst that compresses the right L4 nerve root. ?Moderate spinal stenosis at this level. ?3. L3-4 left facet synovial cyst which is new but noncompressive. ?  ?  ?Electronically Signed ?  By: JJorje GuildM.D. ?  On: 10/28/2021 19:27 ? ?Imaging: ?No results found. ? ? ?PMFS History: ?Patient Active Problem List  ? Diagnosis Date Noted  ? Distal interphalangeal nodule 01/20/2022  ? Status post total left knee replacement 04/25/2021  ? Status post total right knee replacement 01/03/2021  ? Diverticular disease of colon 09/25/2020  ? Statin intolerance 12/27/2018  ? BMI 28.0-28.9,adult 11/01/2018  ? H/O arthroscopy of shoulder 02/10/2018  ? Primary osteoarthritis of left knee 11/25/2016  ? Primary osteoarthritis of right knee 11/25/2016  ? Essential hypertension 06/28/2015  ? H/O prostate cancer 06/28/2015  ? Hereditary and idiopathic peripheral neuropathy 04/17/2015  ? Carpal tunnel syndrome 04/17/2015  ? Hyperlipemia 06/02/2013  ? Asthma,  chronic 06/02/2013  ? Depression 06/02/2013  ? GERD 06/04/2009  ? PERCUTANEOUS TRANSLUMINAL CORONARY ANGIOPLASTY, HX OF 06/04/2009  ? ?Past Medical History:  ?Diagnosis Date  ? Arthritis   ? Asthma, chronic 06/02/2013  ? Carpal tunnel syndrome 04/17/2015  ? Bilateral  ? Depression 06/02/2013  ? DYSPNEA ON EXERTION 06/04/2009  ? Qualifier: Diagnosis of  By: MDomenic Polite MD, FPhillips Hay  ? Hereditary and idiopathic peripheral neuropathy 04/17/2015  ? Hyperlipemia 06/02/2013  ? Hypertension   ? LEG PAIN, BILATERAL 06/04/2009  ? Qualifier: Diagnosis of  By: MDomenic Polite MD, FPhillips Hay  ? PERCUTANEOUS TRANSLUMINAL CORONARY ANGIOPLASTY, HX OF 06/04/2009  ? Qualifier: Diagnosis of  By: MDomenic Polite MD, FPhillips Hay  ? Prostate CA ([facility]   ? and basal cell nose  ?  ?Family History  ?Problem Relation Age of Onset  ? Heart disease Other   ?  Early Cardiac disease for uncle is their 38's as well as deceased.  ? Heart failure Mother   ? Dementia Mother 46  ? Cancer Father 19  ?     testicular, lung  ? Esophageal cancer Brother   ?  ?Past Surgical History:  ?Procedure Laterality Date  ? CARDIAC CATHETERIZATION  1997  ? EXCISION MASS NECK Left 11/12/2020  ? Procedure: EXCISION LEFT POSTERIOR NECK MASS;  Surgeon: Coralie Keens, MD;  Location: [facility];  Service: General;  Laterality: Left;  ? EYE SURGERY Bilateral 2020  ? cataracts removed  ? HERNIA REPAIR    ? HIATAL HERNIA REPAIR    ? LAPAROSCOPIC GASTRIC BANDING  2000  ? [facility] fundoplication  ? MENISCUS REPAIR Left 2012  ? ROBOT ASSISTED LAPAROSCOPIC RADICAL PROSTATECTOMY    ? ROTATOR CUFF REPAIR Right 2019  ? TOTAL KNEE ARTHROPLASTY Right 01/03/2021  ? Procedure: RIGHT TOTAL KNEE ARTHROPLASTY;  Surgeon: Mcarthur Rossetti, MD;  [facility];  Service: Orthopedics;  Laterality: Right;  Needs RNFA  ? TOTAL KNEE ARTHROPLASTY Left 04/25/2021  ? Procedure: LEFT TOTAL KNEE ARTHROPLASTY;  Surgeon: Mcarthur Rossetti, MD;   Location[facility];  Service: Orthopedics;  Laterality: Left;  ? ?Social History  ? ?Occupational History  ? 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 Smoking status: Former  ?  Packs/day: 1.00  ?

## 2022-01-21 ENCOUNTER — Ambulatory Visit (INDEPENDENT_AMBULATORY_CARE_PROVIDER_SITE_OTHER): Payer: Medicare Other | Admitting: Nurse Practitioner

## 2022-01-21 ENCOUNTER — Encounter: Payer: Self-pay | Admitting: Nurse Practitioner

## 2022-01-21 VITALS — BP 145/76 | HR 69 | Temp 98.7°F | Ht 74.0 in | Wt 252.0 lb

## 2022-01-21 DIAGNOSIS — L0291 Cutaneous abscess, unspecified: Secondary | ICD-10-CM | POA: Diagnosis not present

## 2022-01-21 MED ORDER — CEPHALEXIN 500 MG PO CAPS: 500.0000 mg | ORAL_CAPSULE | Freq: Two times a day (BID) | ORAL | 0 refills | Status: DC

## 2022-01-21 NOTE — Patient Instructions (Signed)
Skin Abscess  A skin abscess is an infected area on or under your skin that contains a collection of pus and other material. An abscess may also be called a furuncle, carbuncle, or boil. An abscess can occur in or on almost any part of your body. Some abscesses break open (rupture) on their own. Most continue to get worse unless they are treated. The infection can spread deeper into the body and eventually into your blood, which can make you feel ill. Treatment usually involves draining the abscess. What are the causes? An abscess occurs when germs, like bacteria, pass through your skin and cause an infection. This may be caused by: A scrape or cut on your skin. A puncture wound through your skin, including a needle injection or insect bite. Blocked oil or sweat glands. Blocked and infected hair follicles. A cyst that forms beneath your skin (sebaceous cyst) and becomes infected. What increases the risk? This condition is more likely to develop in people who: Have a weak body defense system (immune system). Have diabetes. Have dry and irritated skin. Get frequent injections or use illegal IV drugs. Have a foreign body in a wound, such as a splinter. Have problems with their lymph system or veins. What are the signs or symptoms? Symptoms of this condition include: A painful, firm bump under the skin. A bump with pus at the top. This may break through the skin and drain. Other symptoms include: Redness surrounding the abscess site. Warmth. Swelling of the lymph nodes (glands) near the abscess. Tenderness. A sore on the skin. How is this diagnosed? This condition may be diagnosed based on: A physical exam. Your medical history. A sample of pus. This may be used to find out what is causing the infection. Blood tests. Imaging tests, such as an ultrasound, CT scan, or MRI. How is this treated? A small abscess that drains on its own may not need treatment. Treatment for larger abscesses  may include: Moist heat or heat pack applied to the area several times a day. A procedure to drain the abscess (incision and drainage). Antibiotic medicines. For a severe abscess, you may first get antibiotics through an IV and then change to antibiotics by mouth. Follow these instructions at home: Medicines  Take over-the-counter and prescription medicines only as told by your health care provider. If you were prescribed an antibiotic medicine, take it as told by your health care provider. Do not stop taking the antibiotic even if you start to feel better. Abscess care  If you have an abscess that has not drained, apply heat to the affected area. Use the heat source that your health care provider recommends, such as a moist heat pack or a heating pad. Place a towel between your skin and the heat source. Leave the heat on for 20-30 minutes. Remove the heat if your skin turns bright red. This is especially important if you are unable to feel pain, heat, or cold. You may have a greater risk of getting burned. Follow instructions from your health care provider about how to take care of your abscess. Make sure you: Cover the abscess with a bandage (dressing). Change your dressing or gauze as told by your health care provider. Wash your hands with soap and water before you change the dressing or gauze. If soap and water are not available, use hand sanitizer. Check your abscess every day for signs of a worsening infection. Check for: More redness, swelling, or pain. More fluid or blood. Warmth.   More pus or a bad smell. General instructions To avoid spreading the infection: Do not share personal care items, towels, or hot tubs with others. Avoid making skin contact with other people. Keep all follow-up visits as told by your health care provider. This is important. Contact a health care provider if you have: More redness, swelling, or pain around your abscess. More fluid or blood coming from  your abscess. Warm skin around your abscess. More pus or a bad smell coming from your abscess. Muscle aches. Chills or a general ill feeling. Get help right away if you: Have severe pain. See red streaks on your skin spreading away from the abscess. See redness that spreads quickly. Have a fever or chills. Summary A skin abscess is an infected area on or under your skin that contains a collection of pus and other material. A small abscess that drains on its own may not need treatment. Treatment for larger abscesses may include having a procedure to drain the abscess and taking an antibiotic. This information is not intended to replace advice given to you by your health care provider. Make sure you discuss any questions you have with your health care provider. Document Revised: 06/09/2021 Document Reviewed: 06/09/2021 Elsevier Patient Education  2023 [employer]  

## 2022-01-21 NOTE — Progress Notes (Signed)
? ?Acute Office Visit ? ?Subjective:  ? ?  ?Patient ID: [name] [name], male    DOB: [birth year], 76 y.o.   MRN: [id number] ? ?Chief Complaint  ?Patient presents with  ? Insect Bite  ?  Infected spot on his back for about a month now , itches   ? ? ?HPI ?Patient is in today for    ? Abscess: Patient presents for evaluation of a cutaneous abscess. Lesion is located in the right buttocks. Onset was a few weeks ago. Symptoms have gradually improved. Abscess has associated symptoms of none. Patient does have previous history of cutaneous abscesses. Patient does not have diabetes.  ? ?Review of Systems  ?Constitutional: Negative.  Negative for chills and fever.  ?HENT: Negative.    ?Eyes: Negative.   ?Respiratory: Negative.    ?Gastrointestinal: Negative.   ?Genitourinary: Negative.   ?Skin: Negative.  Negative for rash.  ?     Abscess right buttocks ?  ?All other systems reviewed and are negative. ? ? ?   ?Objective:  ?  ?BP (!) 145/76   Pulse 69   Temp 98.7 ?F (37.1 ?C)   Ht '6\' 2"'$  (1.88 m)   Wt 252 lb (114.3 kg)   SpO2 96%   BMI 32.35 kg/m?  ?BP Readings from Last 3 Encounters:  ?01/21/22 (!) 145/76  ?01/20/22 129/73  ?12/01/21 (!) 144/71  ? ?Wt Readings from Last 3 Encounters:  ?01/21/22 252 lb (114.3 kg)  ?01/20/22 259 lb (117.5 kg)  ?12/01/21 259 lb (117.5 kg)  ? ?  ? ?Physical Exam ?Vitals and nursing note reviewed.  ?Constitutional:   ?   Appearance: Normal appearance.  ?HENT:  ?   Head: Normocephalic.  ?   Right Ear: External ear normal.  ?   Left Ear: External ear normal.  ?   Nose: Nose normal.  ?   Mouth/Throat:  ?   Mouth: Mucous membranes are moist.  ?   Pharynx: Oropharynx is clear.  ?Eyes:  ?   Conjunctiva/sclera: Conjunctivae normal.  ?Cardiovascular:  ?   Rate and Rhythm: Normal rate and regular rhythm.  ?Pulmonary:  ?   Effort: Pulmonary effort is normal.  ?   Breath sounds: Normal breath sounds.  ?Abdominal:  ?   General: Bowel sounds are normal.  ?Skin: ?   General: Skin is warm.  ?   Findings:  Abscess and erythema present. No rash.  ?Neurological:  ?   General: No focal deficit present.  ?   Mental Status: He is alert and oriented to person, place, and time.  ?Psychiatric:     ?   Behavior: Behavior normal.  ? ? ?No results found for any visits on 01/21/22. ? ? ?   ?Assessment & Plan:  ?-We reviewed the etiology of recurrent abscesses of skin.  Skin abscesses are collections of pus within the dermis and deeper skin tissues. Skin abscesses manifest as painful, tender, fluctuant, and erythematous nodules, frequently surmounted by a pustule and surrounded by a rim of erythematous swelling.  Spontaneous drainage of purulent material may occur.  Fever can occur on occasion.   ?-Skin abscesses can develop in healthy individuals with no predisposing conditions other than skin or nasal carriage of Staphylococcus aureus.  Individuals in close contact with others who have active infection with skin abscesses are at increased risk.  In addition, any process leading to a breach in the skin barrier can also predispose to the development of a skin abscesses, such as atopic  dermatitis.   ?  ?Problem List Items Addressed This Visit   ?None ?Visit Diagnoses   ? ? Abscess    -  Primary  ? Relevant Medications  ? cephALEXin (KEFLEX) 500 MG capsule  ? ?  ? ? ?Meds ordered this encounter  ?Medications  ? cephALEXin (KEFLEX) 500 MG capsule  ?  Sig: Take 1 capsule (500 mg total) by mouth 2 (two) times daily.  ?  Dispense:  14 capsule  ?  Refill:  0  ?  Order Specific Question:   Supervising Provider  ?  AnswerClaretta Fraise [[id number]]  ? ? ?Return if symptoms worsen or fail to improve. ? ?Ivy Lynn, NP ? ? ?

## 2022-01-28 ENCOUNTER — Other Ambulatory Visit: Payer: Self-pay

## 2022-01-28 ENCOUNTER — Encounter (HOSPITAL_BASED_OUTPATIENT_CLINIC_OR_DEPARTMENT_OTHER): Payer: Self-pay | Admitting: Orthopedic Surgery

## 2022-02-04 ENCOUNTER — Encounter (HOSPITAL_BASED_OUTPATIENT_CLINIC_OR_DEPARTMENT_OTHER)
Admission: RE | Disposition: A | Discharge status: Home / Self Care | Payer: Self-pay | Source: Home / Self Care | Attending: Orthopedic Surgery

## 2022-02-04 ENCOUNTER — Ambulatory Visit (HOSPITAL_BASED_OUTPATIENT_CLINIC_OR_DEPARTMENT_OTHER)
Admission: RE | Admit: 2022-02-04 | Discharge: 2022-02-04 | Disposition: A | Discharge status: Home / Self Care | Payer: Medicare Other | Attending: Orthopedic Surgery | Admitting: Orthopedic Surgery

## 2022-02-04 ENCOUNTER — Ambulatory Visit (HOSPITAL_BASED_OUTPATIENT_CLINIC_OR_DEPARTMENT_OTHER): Payer: Medicare Other | Admitting: Certified Registered"

## 2022-02-04 ENCOUNTER — Other Ambulatory Visit: Payer: Self-pay

## 2022-02-04 ENCOUNTER — Encounter (HOSPITAL_BASED_OUTPATIENT_CLINIC_OR_DEPARTMENT_OTHER): Payer: Self-pay | Admitting: Orthopedic Surgery

## 2022-02-04 DIAGNOSIS — I2510 Atherosclerotic heart disease of native coronary artery without angina pectoris: Secondary | ICD-10-CM | POA: Insufficient documentation

## 2022-02-04 DIAGNOSIS — J449 Chronic obstructive pulmonary disease, unspecified: Secondary | ICD-10-CM | POA: Diagnosis not present

## 2022-02-04 DIAGNOSIS — G709 Myoneural disorder, unspecified: Secondary | ICD-10-CM | POA: Insufficient documentation

## 2022-02-04 DIAGNOSIS — M189 Osteoarthritis of first carpometacarpal joint, unspecified: Secondary | ICD-10-CM | POA: Diagnosis not present

## 2022-02-04 DIAGNOSIS — Z8249 Family history of ischemic heart disease and other diseases of the circulatory system: Secondary | ICD-10-CM | POA: Diagnosis not present

## 2022-02-04 DIAGNOSIS — M25742 Osteophyte, left hand: Secondary | ICD-10-CM | POA: Insufficient documentation

## 2022-02-04 DIAGNOSIS — Z87891 Personal history of nicotine dependence: Secondary | ICD-10-CM | POA: Diagnosis not present

## 2022-02-04 DIAGNOSIS — I10 Essential (primary) hypertension: Secondary | ICD-10-CM

## 2022-02-04 DIAGNOSIS — R2232 Localized swelling, mass and lump, left upper limb: Secondary | ICD-10-CM | POA: Diagnosis not present

## 2022-02-04 DIAGNOSIS — M151 Heberden's nodes (with arthropathy): Secondary | ICD-10-CM | POA: Diagnosis not present

## 2022-02-04 HISTORY — PX: EXCISION METACARPAL MASS: SHX6372

## 2022-02-04 SURGERY — EXCISION METACARPAL MASS
Anesthesia: Monitor Anesthesia Care | Site: Thumb | Laterality: Left

## 2022-02-04 MED ORDER — PROPOFOL 500 MG/50ML IV EMUL
INTRAVENOUS | Status: AC
  Filled 2022-02-04: qty 50

## 2022-02-04 MED ORDER — MIDAZOLAM HCL 2 MG/2ML IJ SOLN
INTRAMUSCULAR | Status: DC | PRN
  Administered 2022-02-04: 1 mg via INTRAVENOUS

## 2022-02-04 MED ORDER — BUPIVACAINE HCL (PF) 0.25 % IJ SOLN
INTRAMUSCULAR | Status: DC | PRN
  Administered 2022-02-04: 4 mL
  Administered 2022-02-04: 10 mL

## 2022-02-04 MED ORDER — FENTANYL CITRATE (PF) 100 MCG/2ML IJ SOLN: 25.0000 ug | INTRAMUSCULAR | Status: DC | PRN

## 2022-02-04 MED ORDER — ONDANSETRON HCL 4 MG/2ML IJ SOLN
INTRAMUSCULAR | Status: AC
  Filled 2022-02-04: qty 2

## 2022-02-04 MED ORDER — PROPOFOL 10 MG/ML IV BOLUS
INTRAVENOUS | Status: AC
  Filled 2022-02-04: qty 20

## 2022-02-04 MED ORDER — PROPOFOL 500 MG/50ML IV EMUL
INTRAVENOUS | Status: DC | PRN
  Administered 2022-02-04: 75 ug/kg/min via INTRAVENOUS

## 2022-02-04 MED ORDER — MIDAZOLAM HCL 2 MG/2ML IJ SOLN
INTRAMUSCULAR | Status: AC
  Filled 2022-02-04: qty 2

## 2022-02-04 MED ORDER — OXYCODONE HCL 5 MG PO TABS: 5.0000 mg | ORAL_TABLET | Freq: Once | ORAL | Status: DC | PRN

## 2022-02-04 MED ORDER — OXYCODONE HCL 5 MG/5ML PO SOLN: 5.0000 mg | Freq: Once | ORAL | Status: DC | PRN

## 2022-02-04 MED ORDER — FENTANYL CITRATE (PF) 100 MCG/2ML IJ SOLN
INTRAMUSCULAR | Status: AC
Start: 2022-02-04 — End: ?
  Filled 2022-02-04: qty 2

## 2022-02-04 MED ORDER — OXYCODONE HCL 5 MG PO TABS: 5.0000 mg | ORAL_TABLET | Freq: Four times a day (QID) | ORAL | 0 refills | Status: AC | PRN

## 2022-02-04 MED ORDER — ONDANSETRON HCL 4 MG/2ML IJ SOLN: 4.0000 mg | Freq: Four times a day (QID) | INTRAMUSCULAR | Status: DC | PRN

## 2022-02-04 MED ORDER — ONDANSETRON HCL 4 MG/2ML IJ SOLN
INTRAMUSCULAR | Status: DC | PRN
  Administered 2022-02-04: 4 mg via INTRAVENOUS

## 2022-02-04 MED ORDER — LACTATED RINGERS IV SOLN: INTRAVENOUS | Status: DC

## 2022-02-04 MED ORDER — FENTANYL CITRATE (PF) 100 MCG/2ML IJ SOLN
INTRAMUSCULAR | Status: DC | PRN
Start: 2022-02-04 — End: 2022-02-04
  Administered 2022-02-04: 50 ug via INTRAVENOUS

## 2022-02-04 SURGICAL SUPPLY — 41 items
APL PRP STRL LF DISP 70% ISPRP (MISCELLANEOUS) ×1
BLADE SURG 15 STRL LF DISP TIS (BLADE) ×1 IMPLANT
BLADE SURG 15 STRL SS (BLADE) ×2
BNDG CMPR 9X4 STRL LF SNTH (GAUZE/BANDAGES/DRESSINGS) ×1
BNDG ELASTIC 3X5.8 VLCR STR LF (GAUZE/BANDAGES/DRESSINGS) ×2 IMPLANT
BNDG ESMARK 4X9 LF (GAUZE/BANDAGES/DRESSINGS) ×2 IMPLANT
BNDG GAUZE ELAST 4 BULKY (GAUZE/BANDAGES/DRESSINGS) ×2 IMPLANT
CHLORAPREP W/TINT 26 (MISCELLANEOUS) ×2 IMPLANT
CORD BIPOLAR FORCEPS 12FT (ELECTRODE) ×2 IMPLANT
COVER BACK TABLE 60X90IN (DRAPES) ×2 IMPLANT
COVER MAYO STAND STRL (DRAPES) ×2 IMPLANT
CUFF TOURN SGL QUICK 18X4 (TOURNIQUET CUFF) IMPLANT
CUFF TOURN SGL QUICK 24 (TOURNIQUET CUFF)
CUFF TRNQT CYL 24X4X16.5-23 (TOURNIQUET CUFF) IMPLANT
DRAPE EXTREMITY T 121X128X90 (DISPOSABLE) ×2 IMPLANT
DRAPE SURG 17X23 STRL (DRAPES) ×2 IMPLANT
GAUZE SPONGE 4X4 12PLY STRL (GAUZE/BANDAGES/DRESSINGS) ×1 IMPLANT
GAUZE XEROFORM 1X8 LF (GAUZE/BANDAGES/DRESSINGS) ×2 IMPLANT
GLOVE BIO SURGEON STRL SZ7 (GLOVE) ×2 IMPLANT
GLOVE BIOGEL PI IND STRL 7.0 (GLOVE) ×1 IMPLANT
GLOVE BIOGEL PI INDICATOR 7.0 (GLOVE) ×1
GOWN STRL REUS W/ TWL LRG LVL3 (GOWN DISPOSABLE) ×1 IMPLANT
GOWN STRL REUS W/TWL LRG LVL3 (GOWN DISPOSABLE) ×2
GOWN STRL REUS W/TWL XL LVL3 (GOWN DISPOSABLE) ×2 IMPLANT
NDL HYPO 25X1 1.5 SAFETY (NEEDLE) ×1 IMPLANT
NEEDLE HYPO 25X1 1.5 SAFETY (NEEDLE) ×2 IMPLANT
NS IRRIG 1000ML POUR BTL (IV SOLUTION) ×2 IMPLANT
PACK BASIN DAY SURGERY FS (CUSTOM PROCEDURE TRAY) ×2 IMPLANT
PAD CAST 3X4 CTTN HI CHSV (CAST SUPPLIES) IMPLANT
PADDING CAST COTTON 3X4 STRL (CAST SUPPLIES) ×2
SLEEVE SCD COMPRESS KNEE MED (STOCKING) ×1 IMPLANT
SUCTION FRAZIER HANDLE 10FR (MISCELLANEOUS)
SUCTION TUBE FRAZIER 10FR DISP (MISCELLANEOUS) IMPLANT
SUT ETHILON 4 0 PS 2 18 (SUTURE) ×2 IMPLANT
SUT MNCRL AB 3-0 PS2 18 (SUTURE) ×2 IMPLANT
SUT VICRYL 4-0 PS2 18IN ABS (SUTURE) IMPLANT
SYR BULB EAR ULCER 3OZ GRN STR (SYRINGE) ×2 IMPLANT
SYR CONTROL 10ML LL (SYRINGE) ×2 IMPLANT
TOWEL GREEN STERILE FF (TOWEL DISPOSABLE) ×4 IMPLANT
TUBE CONNECTING 20X1/4 (TUBING) IMPLANT
UNDERPAD 30X36 HEAVY ABSORB (UNDERPADS AND DIAPERS) ×2 IMPLANT

## 2022-02-04 NOTE — Anesthesia Postprocedure Evaluation (Signed)
Anesthesia Post Note  Patient: [name] [name]  Procedure(s) Performed: LEFT THUMB EXCISION MASS (Left: Thumb)     Patient location during evaluation: PACU Anesthesia Type: MAC Level of consciousness: awake and alert Pain management: pain level controlled Vital Signs Assessment: post-procedure vital signs reviewed and stable Respiratory status: spontaneous breathing, nonlabored ventilation, respiratory function stable and patient connected to nasal cannula oxygen Cardiovascular status: stable and blood pressure returned to baseline Postop Assessment: no apparent nausea or vomiting Anesthetic complications: no   No notable events documented.  Last Vitals:  Vitals:   02/04/22 1100 02/04/22 1118  BP: (!) 174/64 (!) 143/72  Pulse: (!) 53 (!) 57  Resp: 14 18  Temp:  36.6 C  SpO2: 96% 95%    Last Pain:  Vitals:   02/04/22 1118  TempSrc: Oral  PainSc: 0-No pain                 [name] S

## 2022-02-04 NOTE — Anesthesia Preprocedure Evaluation (Signed)
Anesthesia Evaluation  Patient identified by MRN, date of birth, ID band Patient awake    Reviewed: Allergy & Precautions, H&P , NPO status , Patient's Chart, lab work & pertinent test results  Airway Mallampati: II   Neck ROM: full    Dental   Pulmonary asthma , COPD, former smoker,    breath sounds clear to auscultation       Cardiovascular hypertension, + CAD   Rhythm:regular Rate:Normal     Neuro/Psych PSYCHIATRIC DISORDERS Depression  Neuromuscular disease    GI/Hepatic GERD  ,  Endo/Other    Renal/GU      Musculoskeletal  (+) Arthritis ,   Abdominal   Peds  Hematology   Anesthesia Other Findings   Reproductive/Obstetrics                             Anesthesia Physical Anesthesia Plan  ASA: 3  Anesthesia Plan: MAC   Post-op Pain Management:    Induction: Intravenous  PONV Risk Score and Plan: 1 and Propofol infusion, Ondansetron and Treatment may vary due to age or medical condition  Airway Management Planned: Simple Face Mask  Additional Equipment:   Intra-op Plan:   Post-operative Plan:   Informed Consent: I have reviewed the patients History and Physical, chart, labs and discussed the procedure including the risks, benefits and alternatives for the proposed anesthesia with the patient or authorized representative who has indicated his/her understanding and acceptance.     Dental advisory given  Plan Discussed with: CRNA, Anesthesiologist and Surgeon  Anesthesia Plan Comments:         Anesthesia Quick Evaluation

## 2022-02-04 NOTE — Transfer of Care (Signed)
Immediate Anesthesia Transfer of Care Note  Patient: [name] [name]  Procedure(s) Performed: LEFT THUMB EXCISION MASS (Left: Thumb)  Patient Location: PACU  Anesthesia Type:MAC  Level of Consciousness: awake, alert  and oriented  Airway & Oxygen Therapy: Patient Spontanous Breathing and Patient connected to face mask oxygen  Post-op Assessment: Report given to RN and Post -op Vital signs reviewed and stable  Post vital signs: Reviewed and stable  Last Vitals:  Vitals Value Taken Time  BP 169/60 02/04/22 1045  Temp 36.5 C 02/04/22 1045  Pulse 53 02/04/22 1050  Resp 16 02/04/22 1050  SpO2 96 % 02/04/22 1050  Vitals shown include unvalidated device data.  Last Pain:  Vitals:   02/04/22 0835  TempSrc: Oral  PainSc: 0-No pain      Patients Stated Pain Goal: 1 (07/62/26 3335)  Complications: No notable events documented.

## 2022-02-04 NOTE — Interval H&P Note (Signed)
History and Physical Interval Note:  02/04/2022 9:23 AM  [name] [name]  has presented today for surgery, with the diagnosis of LEFT THUMB MASS.  The various methods of treatment have been discussed with the patient and family. After consideration of risks, benefits and other options for treatment, the patient has consented to  Procedure(s): LEFT THUMB EXCISION MASS (Left) as a surgical intervention.  The patient's history has been reviewed, patient examined, no change in status, stable for surgery.  I have reviewed the patient's chart and labs.  Questions were answered to the patient's satisfaction.     Felipe Cabell Tyvon Eggenberger

## 2022-02-04 NOTE — Discharge Instructions (Addendum)
Audria Nine, M.D. Hand Surgery  POST-OPERATIVE DISCHARGE INSTRUCTIONS   PRESCRIPTIONS: - You have been given a prescription to be taken as directed for post-operative pain control.  You may also take over the counter ibuprofen/aleve and tylenol for pain. Take this as directed on the packaging. Do not exceed 3000 mg tylenol/acetaminophen in 24 hours.  Ibuprofen 600-800 mg (3-4) tablets by mouth every 6 hours as needed for pain.   OR  Aleve 2 tablets by mouth every 12 hours (twice daily) as needed for pain.   AND/OR  Tylenol 1000 mg (2 tablets) every 8 hours as needed for pain.  - Please use your pain medication carefully, as refills are limited and you may not be provided with one.  As stated above, please use over the counter pain medicine - it will also be helpful with decreasing your swelling.    ANESTHESIA: -After your surgery, post-surgical discomfort or pain is likely. This discomfort can last several days to a few weeks. At certain times of the day your discomfort may be more intense.   Did you receive a nerve block?   - A nerve block can provide pain relief for one hour to two days after your surgery. As long as the nerve block is working, you will experience little or no sensation in the area the surgeon operated on.  - As the nerve block wears off, you will begin to experience pain or discomfort. It is very important that you begin taking your prescribed pain medication before the nerve block fully wears off. Treating your pain at the first sign of the block wearing off will ensure your pain is better controlled and more tolerable when full-sensation returns. Do not wait until the pain is intolerable, as the medicine will be less effective. It is better to treat pain in advance than to try and catch up.   General Anesthesia:  If you did not receive a nerve block during your surgery, you will need to start taking your pain medication shortly after your surgery and  should continue to do so as prescribed by your surgeon.     ICE AND ELEVATION: - You may use ice for the first 48-72 hours, but it is not critical.   - Motion of your fingers is very important to decrease the swelling.  - Elevation, as much as possible for the next 48 hours, is critical for decreasing swelling as well as for pain relief. Elevation means when you are seated or lying down, you hand should be at or above your heart. When walking, the hand needs to be at or above the level of your elbow.  - If the bandage gets too tight, it may need to be loosened. Please contact our office and we will instruct you in how to do this.    SURGICAL BANDAGES:  - Keep your dressing and/or splint clean and dry at all times.  You can remove your dressing 5 days from now and change with a dry dressing or Band-Aids as needed thereafter. - You may place a plastic bag over your bandage to shower, but be careful, do not get your bandages wet.  - After the bandages have been removed, it is OK to get the stitches wet in a shower or with hand washing. Do Not soak or submerge the wound yet. Please do not use lotions or creams on the stitches.      ACTIVITY AND WORK: - You are encouraged to move any fingers which  are not in the bandage.  - Light use of the fingers is allowed to assist the other hand with daily hygiene and eating, but strong gripping or lifting is often uncomfortable and should be avoided.  - You might miss a variable period of time from work and hopefully this issue has been discussed prior to surgery. You may not do any heavy work with your affected hand for about 2 weeks.    [facility] [address] [place],  [place]  [address] [phone]    Post Anesthesia Home Care Instructions  Activity: Get plenty of rest for the remainder of the day. A responsible individual must stay with you for 24 hours following the procedure.  For the next 24 hours, DO NOT: -Drive a  car -Paediatric nurse -Drink alcoholic beverages -Take any medication unless instructed by your physician -Make any legal decisions or sign important papers.  Meals: Start with liquid foods such as gelatin or soup. Progress to regular foods as tolerated. Avoid greasy, spicy, heavy foods. If nausea and/or vomiting occur, drink only clear liquids until the nausea and/or vomiting subsides. Call your physician if vomiting continues.  Special Instructions/Symptoms: Your throat may feel dry or sore from the anesthesia or the breathing tube placed in your throat during surgery. If this causes discomfort, gargle with warm salt water. The discomfort should disappear within 24 hours.  If you had a scopolamine patch placed behind your ear for the management of post- operative nausea and/or vomiting:  1. The medication in the patch is effective for 72 hours, after which it should be removed.  Wrap patch in a tissue and discard in the trash. Wash hands thoroughly with soap and water. 2. You may remove the patch earlier than 72 hours if you experience unpleasant side effects which may include dry mouth, dizziness or visual disturbances. 3. Avoid touching the patch. Wash your hands with soap and water after contact with the patch.

## 2022-02-04 NOTE — Op Note (Signed)
   Date of Surgery: 02/04/2022  INDICATIONS: Patient is a 76 y.o.-year-old male with a large left thumb interphalangeal joint osteophyte.  He was having significant pain at the radial aspect of the dorsal thumb IP joint directly over this large osteophyte.  He has no pain in the thumb with range of motion of the IP joint only with pressure applied to this bony nodule.  We discussed conservative management including oral anti-inflammatory medications, bracing, and corticosteroid injection.  He elected to proceed with surgical debridement of this large osteophyte as it was significantly bothersome for him..  Risks, benefits, and alternatives to surgery were again discussed with the patient in the preoperative area. The patient wishes to proceed with surgery.  Informed consent was signed after our discussion.   PREOPERATIVE DIAGNOSIS:  Left thumb interphalangeal joint osteoarthritis  POSTOPERATIVE DIAGNOSIS: Same.  PROCEDURE:  1.  Debridement of large left thumb IP joint osteophyte   SURGEON: Audria Nine, M.D.  ASSIST:   ANESTHESIA: Local, MAC  IV FLUIDS AND URINE: See anesthesia.  ESTIMATED BLOOD LOSS: Less than 5 mL.  IMPLANTS: * No implants in log *   DRAINS: None  COMPLICATIONS: None  DESCRIPTION OF PROCEDURE: The patient was met in the preoperative holding area where the surgical site was marked and the consent form was verified.  The patient was then taken to the operating room and transferred to the operating table.  All bony prominences were well padded.  A tourniquet was applied to the left forearm.  Monitored sedation was induced.  A formal time-out was performed to confirm that this was the correct patient, surgery, side, and site. A local block was performed using 10 cc of quarter percent plain Marcaine.  The operative extremity was prepped and draped in the usual and sterile fashion.    Following a second timeout, the limb was gently exsanguinated with an Esmarch  bandage.  The tourniquet was inflated to 250 mmHg.  A longitudinal incision was made directly over this large dorsal osteophyte.  The skin was incised.  Full-thickness skin flaps were raised.  The osteophyte was identified.  A capsulotomy was performed between the terminal tendon and the dorsal aspect of the collateral ligament.  The large osteophyte was debrided using a combination of rongeurs and small curette.  Care was taken to not debride so much of the distal phalanx so as to disturb the terminal tendon apparatus.  The prominent nodule was no longer apparent following thorough debridement.  The wound and joint were then thoroughly irrigated with copious sterile saline.  The tourniquet was let down and hemostasis achieved with direct pressure over the wound.  The thumb was warm, pink, and well-perfused once the tourniquet was let down.  The skin was closed using 4-0 nylon sutures in a horizontal mattress fashion.  Wound was dressed with Xeroform, folded Kerlix, and an Ace wrap.  The patient was then from sedation and transferred to the postoperative bed.  Counts were correct x2 at the end of the procedure.  The patient was and taken to PACU in stable condition.   POSTOPERATIVE PLAN: She will be discharged home with appropriate pain medication and discharge instructions.  I will see him in office in 10 to 14 days for his first postop visit.  Audria Nine, MD 10:42 AM

## 2022-02-05 ENCOUNTER — Encounter (HOSPITAL_BASED_OUTPATIENT_CLINIC_OR_DEPARTMENT_OTHER): Payer: Self-pay | Admitting: Orthopedic Surgery

## 2022-02-16 ENCOUNTER — Ambulatory Visit (INDEPENDENT_AMBULATORY_CARE_PROVIDER_SITE_OTHER): Payer: Medicare Other | Admitting: Orthopedic Surgery

## 2022-02-16 DIAGNOSIS — M151 Heberden's nodes (with arthropathy): Secondary | ICD-10-CM

## 2022-02-16 DIAGNOSIS — R2232 Localized swelling, mass and lump, left upper limb: Secondary | ICD-10-CM

## 2022-02-16 NOTE — Progress Notes (Unsigned)
Post-Op Visit Note   Patient: [name] [name]           Date of Birth: 05-Dec-1945           MRN: [id number] Visit Date: 02/16/2022 PCP: Chevis Pretty, FNP   Assessment & Plan:  Chief Complaint: No chief complaint on file.  Visit Diagnoses:  1. Subcutaneous nodule of left thumb   2. Distal interphalangeal nodule     Plan: Patient is just under two weeks s/p debridement of thumb thumb radial DIP joint osteophyte.  He is doing well postoperatively.  His incision is clean, dry, and well approximated.  The nylon sutures were removed.  His previous nocturnal thumb pain has resolved.  He has no complaints today.   Follow-Up Instructions: No follow-ups on file.   Orders:  No orders of the defined types were placed in this encounter.  No orders of the defined types were placed in this encounter.   Imaging: No results found.  PMFS History: Patient Active Problem List   Diagnosis Date Noted   Subcutaneous nodule of left thumb    Distal interphalangeal nodule 01/20/2022   Status post total left knee replacement 04/25/2021   Status post total right knee replacement 01/03/2021   Diverticular disease of colon 09/25/2020   Statin intolerance 12/27/2018   BMI 28.0-28.9,adult 11/01/2018   H/O arthroscopy of shoulder 02/10/2018   Primary osteoarthritis of left knee 11/25/2016   Primary osteoarthritis of right knee 11/25/2016   Essential hypertension 06/28/2015   H/O prostate cancer 06/28/2015   Hereditary and idiopathic peripheral neuropathy 04/17/2015   Carpal tunnel syndrome 04/17/2015   Hyperlipemia 06/02/2013   Asthma, chronic 06/02/2013   Depression 06/02/2013   GERD 06/04/2009   PERCUTANEOUS TRANSLUMINAL CORONARY ANGIOPLASTY, HX OF 06/04/2009   Past Medical History:  Diagnosis Date   Arthritis    Asthma, chronic 06/02/2013   Carpal tunnel syndrome 04/17/2015   Bilateral   COPD (chronic obstructive pulmonary disease) ([place])    Depression 06/02/2013   DYSPNEA  ON EXERTION 06/04/2009   Qualifier: Diagnosis of  By: Domenic Polite, MD, [place]    Hereditary and idiopathic peripheral neuropathy 04/17/2015   Hyperlipemia 06/02/2013   Hypertension    LEG PAIN, BILATERAL 06/04/2009   Qualifier: Diagnosis of  By: Domenic Polite, MD, FACC, [place], HX OF 06/04/2009   Qualifier: Diagnosis of  By: Domenic Polite, MD, Phillips Hay    Prostate CA [facility])    and basal cell nose    Family History  Problem Relation Age of Onset   Heart disease Other        Early Cardiac disease for uncle is their 28's as well as deceased.   Heart failure Mother    Dementia Mother 54   Cancer Father 23       testicular, lung   Esophageal cancer Brother     Past Surgical History:  Procedure Laterality Date   CARDIAC CATHETERIZATION  1997   EXCISION MASS NECK Left 11/12/2020   Procedure: EXCISION LEFT POSTERIOR NECK MASS;  Surgeon: Coralie Keens, MD;  Location: [place];  Service: General;  Laterality: Left;   EXCISION METACARPAL MASS Left 02/04/2022   Procedure: LEFT THUMB EXCISION MASS;  Surgeon: Sherilyn Cooter, MD;  Location: [place];  Service: Orthopedics;  Laterality: Left;   EYE SURGERY Bilateral 2020   cataracts removed   HERNIA REPAIR     HIATAL HERNIA REPAIR  LAPAROSCOPIC GASTRIC BANDING  7125   [facility] fundoplication   MENISCUS REPAIR Left 2012   ROBOT ASSISTED LAPAROSCOPIC RADICAL PROSTATECTOMY     ROTATOR CUFF REPAIR Right 2019   TOTAL KNEE ARTHROPLASTY Right 01/03/2021   Procedure: RIGHT TOTAL KNEE ARTHROPLASTY;  Surgeon: Mcarthur Rossetti, MD;  Location: [facility];  Service: Orthopedics;  Laterality: Right;  Needs RNFA   TOTAL KNEE ARTHROPLASTY Left 04/25/2021   Procedure: LEFT TOTAL KNEE ARTHROPLASTY;  Surgeon: Mcarthur Rossetti, MD;  Location: [facility];  Service: Orthopedics;  Laterality: Left;   Social History   Occupational History    Occupation: Retired    Comment: Nature conservation officer  Tobacco Use   Smoking status: Former    Packs/day: 1.00    Years: 20.00    Pack years: 20.00    Types: Cigarettes    Quit date: 01/18/1993    Years since quitting: 29.0   Smokeless tobacco: Never  Vaping Use   Vaping Use: Never used  Substance and Sexual Activity   Alcohol use: Yes    Alcohol/week: 2.0 standard drinks    Types: 2 Cans of beer per week    Comment: occasional   Drug use: No   Sexual activity: Yes

## 2022-02-17 ENCOUNTER — Other Ambulatory Visit: Payer: Self-pay | Admitting: Nurse Practitioner

## 2022-02-22 ENCOUNTER — Other Ambulatory Visit: Payer: Self-pay | Admitting: Nurse Practitioner

## 2022-02-22 DIAGNOSIS — I10 Essential (primary) hypertension: Secondary | ICD-10-CM

## 2022-03-02 ENCOUNTER — Encounter: Payer: Self-pay | Admitting: Nurse Practitioner

## 2022-03-02 ENCOUNTER — Ambulatory Visit (INDEPENDENT_AMBULATORY_CARE_PROVIDER_SITE_OTHER): Payer: Medicare Other | Admitting: Nurse Practitioner

## 2022-03-02 VITALS — BP 138/72 | HR 57 | Temp 98.6°F | Ht 74.0 in | Wt 252.0 lb

## 2022-03-02 DIAGNOSIS — L2389 Allergic contact dermatitis due to other agents: Secondary | ICD-10-CM

## 2022-03-02 MED ORDER — PREDNISONE 20 MG PO TABS: 20.0000 mg | ORAL_TABLET | Freq: Every day | ORAL | 0 refills | Status: DC

## 2022-03-02 MED ORDER — METHYLPREDNISOLONE ACETATE 40 MG/ML IJ SUSP
40.0000 mg | Freq: Once | INTRAMUSCULAR | Status: AC
  Administered 2022-03-02: 40 mg via INTRAMUSCULAR

## 2022-03-02 MED ORDER — HYDROCORTISONE 1 % EX LOTN: 1.0000 | TOPICAL_LOTION | Freq: Two times a day (BID) | CUTANEOUS | 0 refills | Status: DC

## 2022-03-02 NOTE — Progress Notes (Signed)
Acute Office Visit  Subjective:     Patient ID: [name] [name], male    DOB: 1946/01/08, 76 y.o.   MRN: [id number]  Chief Complaint  Patient presents with   [place] there about 2 days - spreading up leg     Rash This is a new problem. The current episode started in the past 7 days. The problem has been gradually worsening since onset. The affected locations include the left ankle. The rash is characterized by burning, dryness, blistering and itchiness. It is unknown if there was an exposure to a precipitant. Pertinent negatives include no congestion, cough, diarrhea, facial edema, fatigue, fever or joint pain. Past treatments include antibiotic cream. The treatment provided no relief.     Review of Systems  Constitutional: Negative.  Negative for fatigue and fever.  HENT: Negative.  Negative for congestion.   Eyes: Negative.   Respiratory: Negative.  Negative for cough.   Cardiovascular: Negative.   Gastrointestinal:  Negative for diarrhea.  Genitourinary: Negative.   Musculoskeletal: Negative.  Negative for joint pain.  Skin:  Positive for rash.  Psychiatric/Behavioral:  The patient is not nervous/anxious.   All other systems reviewed and are negative.       Objective:    BP 138/72   Pulse (!) 57   Temp 98.6 F (37 C)   Ht '6\' 2"'$  (1.88 m)   Wt 252 lb (114.3 kg)   SpO2 96%   BMI 32.35 kg/m  BP Readings from Last 3 Encounters:  03/02/22 138/72  02/04/22 (!) 143/72  01/21/22 (!) 145/76   Wt Readings from Last 3 Encounters:  03/02/22 252 lb (114.3 kg)  02/04/22 249 lb 12.5 oz (113.3 kg)  01/21/22 252 lb (114.3 kg)      Physical Exam Vitals and nursing note reviewed.  Constitutional:      Appearance: Normal appearance.  HENT:     Head: Normocephalic.     Right Ear: External ear normal.     Left Ear: External ear normal.     Nose: Nose normal.     Mouth/Throat:     Mouth: Mucous membranes are moist.     Pharynx: Oropharynx is  clear.  Eyes:     Conjunctiva/sclera: Conjunctivae normal.  Cardiovascular:     Rate and Rhythm: Normal rate and regular rhythm.     Pulses: Normal pulses.     Heart sounds: Normal heart sounds.  Pulmonary:     Effort: Pulmonary effort is normal.     Breath sounds: Normal breath sounds.  Abdominal:     General: Bowel sounds are normal.  Musculoskeletal:        General: Normal range of motion.  Skin:    General: Skin is warm.     Findings: Erythema and rash present. Rash is purpuric.          Comments: Rash left lower ankle  Neurological:     General: No focal deficit present.     Mental Status: He is alert and oriented to person, place, and time.  Psychiatric:        Mood and Affect: Mood normal.        Behavior: Behavior normal.     No results found for any visits on 03/02/22.      Assessment & Plan:   Left lower leg rash not well controlled in the past few days.  Pruritic, erythema assessed.  Take all medication as prescribed -40  Depo-Medrol shot given in clinic -Prednisone 20 mg tablet by mouth daily for 6 days -Avoid scratching with fingers -Keep area clean and dry -Cool compress as needed -Apply hydrocortisone cream 1% as needed  Follow-up with worsening unresolved symptoms.  Problem List Items Addressed This Visit   None Visit Diagnoses     Allergic contact dermatitis due to other agents    -  Primary   Relevant Medications   methylPREDNISolone acetate (DEPO-MEDROL) injection 40 mg (Completed)   predniSONE (DELTASONE) 20 MG tablet   hydrocortisone 1 % lotion       Meds ordered this encounter  Medications   methylPREDNISolone acetate (DEPO-MEDROL) injection 40 mg   predniSONE (DELTASONE) 20 MG tablet    Sig: Take 1 tablet (20 mg total) by mouth daily with breakfast.    Dispense:  6 tablet    Refill:  0    Order Specific Question:   Supervising Provider    Answer:   Jeneen Rinks   hydrocortisone 1 % lotion    Sig: Apply 1  Application topically 2 (two) times daily.    Dispense:  118 mL    Refill:  0    Order Specific Question:   Supervising Provider    Answer:   Claretta Fraise [[id number]]    Return if symptoms worsen or fail to improve.  Ivy Lynn, NP

## 2022-03-02 NOTE — Patient Instructions (Signed)
Contact Dermatitis Dermatitis is redness, soreness, and swelling (inflammation) of the skin. Contact dermatitis is a reaction to certain substances that touch the skin. Many different substances can cause contact dermatitis. There are two types of contact dermatitis: Irritant contact dermatitis. This type is caused by something that irritates your skin, such as having dry hands from washing them too often with soap. This type does not require previous exposure to the substance for a reaction to occur. This is the most common type. Allergic contact dermatitis. This type is caused by a substance that you are allergic to, such as poison ivy. This type occurs when you have been exposed to the substance (allergen) and develop a sensitivity to it. Dermatitis may develop soon after your first exposure to the allergen, or it may not develop until the next time you are exposed and every time thereafter. What are the causes? Irritant contact dermatitis is most commonly caused by exposure to: Makeup. Soaps. Detergents. Bleaches. Acids. Metal salts, such as nickel. Allergic contact dermatitis is most commonly caused by exposure to: Poisonous plants. Chemicals. Jewelry. Latex. Medicines. Preservatives in products, such as clothing. What increases the risk? You are more likely to develop this condition if you have: A job that exposes you to irritants or allergens. Certain medical conditions, such as asthma or eczema. What are the signs or symptoms? Symptoms of this condition may occur on your body anywhere the irritant has touched you or is touched by you. Symptoms include: Dryness or flaking. Redness. Cracks. Itching. Pain or a burning feeling. Blisters. Drainage of small amounts of blood or clear fluid from skin cracks. With allergic contact dermatitis, there may also be swelling in areas such as the eyelids, mouth, or genitals. How is this diagnosed? This condition is diagnosed with a medical  history and physical exam. A patch skin test may be performed to help determine the cause. If the condition is related to your job, you may need to see an occupational medicine specialist. How is this treated? This condition is treated by checking for the cause of the reaction and protecting your skin from further contact. Treatment may also include: Steroid creams or ointments. Oral steroid medicines may be needed in more severe cases. Antibiotic medicines or antibacterial ointments, if a skin infection is present. Antihistamine lotion or an antihistamine taken by mouth to ease itching. A bandage (dressing). Follow these instructions at home: Skin care Moisturize your skin as needed. Apply cool compresses to the affected areas. Try applying baking soda paste to your skin. Stir water into baking soda until it reaches a paste-like consistency. Do not scratch your skin, and avoid friction to the affected area. Avoid the use of soaps, perfumes, and dyes. Medicines Take or apply over-the-counter and prescription medicines only as told by your health care provider. If you were prescribed an antibiotic medicine, take or apply the antibiotic as told by your health care provider. Do not stop using the antibiotic even if your condition improves. Bathing Try taking a bath with: Epsom salts. Follow the instructions on the packaging. You can get these at your local pharmacy or grocery store. Baking soda. Pour a small amount into the bath as directed by your health care provider. Colloidal oatmeal. Follow the instructions on the packaging. You can get this at your local pharmacy or grocery store. Bathe less frequently, such as every other day. Bathe in lukewarm water. Avoid using hot water. Bandage care If you were given a bandage (dressing), change it as told   by your health care provider. Wash your hands with soap and water before and after you change your dressing. If soap and water are not  available, use hand sanitizer. General instructions Avoid the substance that caused your reaction. If you do not know what caused it, keep a journal to try to track what caused it. Write down: What you eat. What cosmetic products you use. What you drink. What you wear in the affected area. This includes jewelry. Check the affected areas every day for signs of infection. Check for: More redness, swelling, or pain. More fluid or blood. Warmth. Pus or a bad smell. Keep all follow-up visits as told by your health care provider. This is important. Contact a health care provider if: Your condition does not improve with treatment. Your condition gets worse. You have signs of infection such as swelling, tenderness, redness, soreness, or warmth in the affected area. You have a fever. You have new symptoms. Get help right away if: You have a severe headache, neck pain, or neck stiffness. You vomit. You feel very sleepy. You notice red streaks coming from the affected area. Your bone or joint underneath the affected area becomes painful after the skin has healed. The affected area turns darker. You have difficulty breathing. Summary Dermatitis is redness, soreness, and swelling (inflammation) of the skin. Contact dermatitis is a reaction to certain substances that touch the skin. Symptoms of this condition may occur on your body anywhere the irritant has touched you or is touched by you. This condition is treated by figuring out what caused the reaction and protecting your skin from further contact. Treatment may also include medicines and skin care. Avoid the substance that caused your reaction. If you do not know what caused it, keep a journal to try to track what caused it. Contact a health care provider if your condition gets worse or you have signs of infection such as swelling, tenderness, redness, soreness, or warmth in the affected area. This information is not intended to replace  advice given to you by your health care provider. Make sure you discuss any questions you have with your health care provider. Document Revised: 06/16/2021 Document Reviewed: 06/16/2021 Elsevier Patient Education  2023 [employer]  

## 2022-03-24 ENCOUNTER — Other Ambulatory Visit: Payer: Self-pay | Admitting: Nurse Practitioner

## 2022-04-10 ENCOUNTER — Telehealth: Payer: Self-pay | Admitting: *Deleted

## 2022-04-10 NOTE — Telephone Encounter (Signed)
Attempted Ortho bundle 1 year call for Right total knee arthroplasty done by Dr. Ninfa Linden. Requested call back.

## 2022-04-13 ENCOUNTER — Telehealth: Payer: Self-pay | Admitting: *Deleted

## 2022-04-13 NOTE — Telephone Encounter (Signed)
Ortho bundle 1 year call completed for Right TKR and Left TKR.

## 2022-05-01 ENCOUNTER — Telehealth: Payer: Self-pay | Admitting: Physical Medicine and Rehabilitation

## 2022-05-01 NOTE — Telephone Encounter (Signed)
Patient's wife Arnetta called advised patient need to schedule an appointment with Dr. Ernestina Patches for his back. The number to contact patient is [phone]

## 2022-05-21 ENCOUNTER — Ambulatory Visit: Payer: Self-pay

## 2022-05-21 ENCOUNTER — Ambulatory Visit (INDEPENDENT_AMBULATORY_CARE_PROVIDER_SITE_OTHER): Payer: Medicare Other | Admitting: Physical Medicine and Rehabilitation

## 2022-05-21 ENCOUNTER — Encounter: Payer: Self-pay | Admitting: Physical Medicine and Rehabilitation

## 2022-05-21 VITALS — BP 134/68 | HR 60

## 2022-05-21 DIAGNOSIS — M5416 Radiculopathy, lumbar region: Secondary | ICD-10-CM | POA: Diagnosis not present

## 2022-05-21 MED ORDER — METHYLPREDNISOLONE ACETATE 80 MG/ML IJ SUSP
40.0000 mg | Freq: Once | INTRAMUSCULAR | Status: AC
  Administered 2022-05-21: 40 mg

## 2022-05-21 NOTE — Progress Notes (Signed)
[name] [name] - 76 y.o. male MRN [id number]  Date of birth: 05-Dec-1945  Office Visit Note: Visit Date: 05/21/2022 PCP: Chevis Pretty, FNP Referred by: Hassell Done, Mary-Margaret, *  Subjective: Chief Complaint  Patient presents with   Lower Back - Pain   Right Leg - Pain   Left Leg - Pain   HPI:  [name] [name] is a 76 y.o. male who comes in today for planned repeat Bilateral L4-5  Lumbar Transforaminal epidural steroid injection with fluoroscopic guidance.  The patient has failed conservative care including home exercise, medications, time and activity modification.  This injection will be diagnostic and hopefully therapeutic.  Please see requesting physician notes for further details and justification. Patient received more than 50% pain relief from prior injection.   Referring: Dr. Basil Dess   ROS Otherwise per HPI.  Assessment & Plan: Visit Diagnoses:    ICD-10-CM   1. Lumbar radiculopathy  M54.16 XR C-ARM NO REPORT    Epidural Steroid injection    methylPREDNISolone acetate (DEPO-MEDROL) injection 40 mg      Plan: No additional findings.   Meds & Orders:  Meds ordered this encounter  Medications   methylPREDNISolone acetate (DEPO-MEDROL) injection 40 mg    Orders Placed This Encounter  Procedures   XR C-ARM NO REPORT   Epidural Steroid injection    Follow-up: Return for visit to requesting provider as needed.   Procedures: No procedures performed  Lumbosacral Transforaminal Epidural Steroid Injection - Sub-Pedicular Approach with Fluoroscopic Guidance  Patient: [name] [name]      Date of Birth: [birth year] MRN: [id number] PCP: Chevis Pretty, FNP      Visit Date: 05/21/2022   Universal Protocol:    Date/Time: 05/21/2022  Consent Given By: the patient  Position: PRONE  Additional Comments: Vital signs were monitored before and after the procedure. Patient was prepped and draped in the usual sterile fashion. The correct patient,  procedure, and site was verified.   Injection Procedure Details:   Procedure diagnoses: Lumbar radiculopathy [M54.16]    Meds Administered:  Meds ordered this encounter  Medications   methylPREDNISolone acetate (DEPO-MEDROL) injection 40 mg    Laterality: Bilateral  Location/Site: L4  Needle:6.0 in., 22 ga.  Short bevel or Quincke spinal needle  Needle Placement: Transforaminal  Findings:    -Comments: Excellent flow of contrast along the nerve, nerve root and into the epidural space.  Procedure Details: After squaring off the end-plates to get a true AP view, the C-arm was positioned so that an oblique view of the foramen as noted above was visualized. The target area is just inferior to the "nose of the scotty dog" or sub pedicular. The soft tissues overlying this structure were infiltrated with 2-3 ml. of 1% Lidocaine without Epinephrine.  The spinal needle was inserted toward the target using a "trajectory" view along the fluoroscope beam.  Under AP and lateral visualization, the needle was advanced so it did not puncture dura and was located close the 6 O'Clock position of the pedical in AP tracterory. Biplanar projections were used to confirm position. Aspiration was confirmed to be negative for CSF and/or blood. A 1-2 ml. volume of Isovue-250 was injected and flow of contrast was noted at each level. Radiographs were obtained for documentation purposes.   After attaining the desired flow of contrast documented above, a 0.5 to 1.0 ml test dose of 0.25% Marcaine was injected into each respective transforaminal space.  The patient was observed for 90 seconds post injection.  After no sensory deficits were reported, and normal lower extremity motor function was noted,   the above injectate was administered so that equal amounts of the injectate were placed at each foramen (level) into the transforaminal epidural space.   Additional Comments:  The patient tolerated the procedure  well Dressing: 2 x 2 sterile gauze and Band-Aid    Post-procedure details: Patient was observed during the procedure. Post-procedure instructions were reviewed.  Patient left the clinic in stable condition.    Clinical History: MRI LUMBAR SPINE WITHOUT AND WITH CONTRAST   TECHNIQUE: Multiplanar and multiecho pulse sequences of the lumbar spine were obtained without and with intravenous contrast.   CONTRAST:  63m MULTIHANCE GADOBENATE DIMEGLUMINE 529 MG/ML IV SOLN   COMPARISON:  07/21/2020   FINDINGS: Segmentation:  5 lumbar type vertebrae based on prior   Alignment:  Grade 1 anterolisthesis at L4-5, mildly progressed.   Vertebrae:  No fracture, evidence of discitis, or bone lesion.   Conus medullaris and cauda equina: Conus extends to the T12-L1 level. Conus and cauda equina appear normal.   Paraspinal and other soft tissues: Lower pole renal cyst on the left.   Disc levels:   T12- L1: Spondylosis with ventral spur that is likely bridging   L1-L2: Fairly bulky ventral spondylitic spur.  No impingement   L2-L3: Ventral spondylitic spurring.  Mild disc bulging.   L3-L4: Disc narrowing and bulging with ventral spurring. Bilateral facet spurring and ligamentum flavum thickening. Anterior and medial synovial cyst from the left facet measuring 5 mm. No neural compression.   L4-L5: Advanced facet degeneration with spurring and anterolisthesis. Bilobed anterior synovial cyst measuring 14 mm with compression of the right L4 nerve root. Patent left foramen. Moderate spinal stenosis   L5-S1:Facet osteoarthritis with asymmetric bulky spurring. Eccentric right disc bulging and far-lateral spurring. Moderate right foraminal stenosis.   IMPRESSION: 1. Generalized lumbar spine degeneration with progression from 2021. 2. L4-5 severe facet osteoarthritis with anterolisthesis and 14 mm right synovial cyst that compresses the right L4 nerve root. Moderate spinal stenosis at  this level. 3. L3-4 left facet synovial cyst which is new but noncompressive.     Electronically Signed   By: JJorje GuildM.D.   On: 10/28/2021 19:27     Objective:  VS:  HT:    WT:   BMI:     BP:134/68  HR:60bpm  TEMP: ( )  RESP:  Physical Exam Vitals and nursing note reviewed.  Constitutional:      General: He is not in acute distress.    Appearance: Normal appearance. He is not ill-appearing.  HENT:     Head: Normocephalic and atraumatic.     Right Ear: External ear normal.     Left Ear: External ear normal.     Nose: No congestion.  Eyes:     Extraocular Movements: Extraocular movements intact.  Cardiovascular:     Rate and Rhythm: Normal rate.     Pulses: Normal pulses.  Pulmonary:     Effort: Pulmonary effort is normal. No respiratory distress.  Abdominal:     General: There is no distension.     Palpations: Abdomen is soft.  Musculoskeletal:        General: No tenderness or signs of injury.     Cervical back: Neck supple.     Right lower leg: No edema.     Left lower leg: No edema.     Comments: Patient has good distal strength without clonus.  Skin:  Findings: No erythema or rash.  Neurological:     General: No focal deficit present.     Mental Status: He is alert and oriented to person, place, and time.     Sensory: No sensory deficit.     Motor: No weakness or abnormal muscle tone.     Coordination: Coordination normal.  Psychiatric:        Mood and Affect: Mood normal.        Behavior: Behavior normal.      Imaging: XR C-ARM NO REPORT  Result Date: 05/21/2022 Please see Notes tab for imaging impression.

## 2022-05-21 NOTE — Procedures (Signed)
Lumbosacral Transforaminal Epidural Steroid Injection - Sub-Pedicular Approach with Fluoroscopic Guidance  Patient: [name] [name]      Date of Birth: March 15, 1946 MRN: [id number] PCP: Chevis Pretty, FNP      Visit Date: 05/21/2022   Universal Protocol:    Date/Time: 05/21/2022  Consent Given By: the patient  Position: PRONE  Additional Comments: Vital signs were monitored before and after the procedure. Patient was prepped and draped in the usual sterile fashion. The correct patient, procedure, and site was verified.   Injection Procedure Details:   Procedure diagnoses: Lumbar radiculopathy [M54.16]    Meds Administered:  Meds ordered this encounter  Medications   methylPREDNISolone acetate (DEPO-MEDROL) injection 40 mg    Laterality: Bilateral  Location/Site: L4  Needle:6.0 in., 22 ga.  Short bevel or Quincke spinal needle  Needle Placement: Transforaminal  Findings:    -Comments: Excellent flow of contrast along the nerve, nerve root and into the epidural space.  Procedure Details: After squaring off the end-plates to get a true AP view, the C-arm was positioned so that an oblique view of the foramen as noted above was visualized. The target area is just inferior to the "nose of the scotty dog" or sub pedicular. The soft tissues overlying this structure were infiltrated with 2-3 ml. of 1% Lidocaine without Epinephrine.  The spinal needle was inserted toward the target using a "trajectory" view along the fluoroscope beam.  Under AP and lateral visualization, the needle was advanced so it did not puncture dura and was located close the 6 O'Clock position of the pedical in AP tracterory. Biplanar projections were used to confirm position. Aspiration was confirmed to be negative for CSF and/or blood. A 1-2 ml. volume of Isovue-250 was injected and flow of contrast was noted at each level. Radiographs were obtained for documentation purposes.   After attaining the  desired flow of contrast documented above, a 0.5 to 1.0 ml test dose of 0.25% Marcaine was injected into each respective transforaminal space.  The patient was observed for 90 seconds post injection.  After no sensory deficits were reported, and normal lower extremity motor function was noted,   the above injectate was administered so that equal amounts of the injectate were placed at each foramen (level) into the transforaminal epidural space.   Additional Comments:  The patient tolerated the procedure well Dressing: 2 x 2 sterile gauze and Band-Aid    Post-procedure details: Patient was observed during the procedure. Post-procedure instructions were reviewed.  Patient left the clinic in stable condition.

## 2022-05-21 NOTE — Patient Instructions (Signed)

## 2022-05-21 NOTE — Progress Notes (Signed)
Pt state lower back pain that travel down both legs and pt feels numbness in both thighs. Pt state walking makes the pain worse. Pt state he takes pain meds and use heating pads to ease his pain. Pt state last injection that was for his right side helped more than the left.  Numeric Pain Rating Scale and Functional Assessment Average Pain 2   In the last MONTH (on 0-10 scale) has pain interfered with the following?  1. General activity like being  able to carry out your everyday physical activities such as walking, climbing stairs, carrying groceries, or moving a chair?  Rating(8)   +Driver, -BT, -Dye Allergies.

## 2022-06-04 ENCOUNTER — Ambulatory Visit (INDEPENDENT_AMBULATORY_CARE_PROVIDER_SITE_OTHER): Payer: Medicare Other | Admitting: Nurse Practitioner

## 2022-06-04 ENCOUNTER — Encounter: Payer: Self-pay | Admitting: Nurse Practitioner

## 2022-06-04 VITALS — BP 129/63 | HR 62 | Temp 98.2°F | Resp 20 | Ht 74.0 in | Wt 252.0 lb

## 2022-06-04 DIAGNOSIS — K5730 Diverticulosis of large intestine without perforation or abscess without bleeding: Secondary | ICD-10-CM

## 2022-06-04 DIAGNOSIS — J4520 Mild intermittent asthma, uncomplicated: Secondary | ICD-10-CM

## 2022-06-04 DIAGNOSIS — I10 Essential (primary) hypertension: Secondary | ICD-10-CM

## 2022-06-04 DIAGNOSIS — F3342 Major depressive disorder, recurrent, in full remission: Secondary | ICD-10-CM | POA: Diagnosis not present

## 2022-06-04 DIAGNOSIS — Z23 Encounter for immunization: Secondary | ICD-10-CM | POA: Diagnosis not present

## 2022-06-04 DIAGNOSIS — R739 Hyperglycemia, unspecified: Secondary | ICD-10-CM

## 2022-06-04 DIAGNOSIS — E782 Mixed hyperlipidemia: Secondary | ICD-10-CM | POA: Diagnosis not present

## 2022-06-04 DIAGNOSIS — Z6828 Body mass index (BMI) 28.0-28.9, adult: Secondary | ICD-10-CM

## 2022-06-04 DIAGNOSIS — K219 Gastro-esophageal reflux disease without esophagitis: Secondary | ICD-10-CM

## 2022-06-04 LAB — BAYER DCA HB A1C WAIVED: HB A1C (BAYER DCA - WAIVED): 5.7 % — ABNORMAL HIGH (ref 4.8–5.6)

## 2022-06-04 MED ORDER — MONTELUKAST SODIUM 10 MG PO TABS: 10.0000 mg | ORAL_TABLET | Freq: Every day | ORAL | 1 refills | Status: DC

## 2022-06-04 MED ORDER — ATENOLOL 25 MG PO TABS: 25.0000 mg | ORAL_TABLET | Freq: Every day | ORAL | 1 refills | Status: DC

## 2022-06-04 MED ORDER — AMLODIPINE BESYLATE 5 MG PO TABS: 5.0000 mg | ORAL_TABLET | Freq: Every day | ORAL | 1 refills | Status: DC

## 2022-06-04 MED ORDER — BUDESONIDE-FORMOTEROL FUMARATE 160-4.5 MCG/ACT IN AERO: 2.0000 | INHALATION_SPRAY | Freq: Two times a day (BID) | RESPIRATORY_TRACT | 3 refills | Status: DC

## 2022-06-04 MED ORDER — CITALOPRAM HYDROBROMIDE 20 MG PO TABS: 20.0000 mg | ORAL_TABLET | Freq: Every day | ORAL | 1 refills | Status: DC

## 2022-06-04 MED ORDER — PRAVASTATIN SODIUM 20 MG PO TABS: 10.0000 mg | ORAL_TABLET | Freq: Every day | ORAL | 1 refills | Status: DC

## 2022-06-04 NOTE — Progress Notes (Signed)
Subjective:    Patient ID: [name] [name], male    DOB: 06-07-1946, 76 y.o.   MRN: [id number]   Chief Complaint: medical management for chronic issues   HPI:  [name] [name] is a 76 y.o. who identifies as a male who was assigned male at birth.   Social history: Lives with: wife Work history: retired- served in [employer] for several years   [place] in today for follow up of the following chronic medical issues:  1. Essential hypertension No c/o chest pain, sob or headaches. Does not check BP at home. BP Readings from Last 3 Encounters:  05/21/22 134/68  03/02/22 138/72  02/04/22 (!) 143/72     2. Mixed hyperlipidemia Does not watch diet or exercise regularly. Lab Results  Component Value Date   CHOL 140 12/01/2021   HDL 44 12/01/2021   LDLCALC 59 12/01/2021   LDLDIRECT 92.8 08/28/2014   TRIG 232 (H) 12/01/2021   CHOLHDL 3.2 12/01/2021   The 10-year ASCVD risk score (Arnett DK, et al., 2019) is: 28.4%   3. Gastroesophageal reflux disease, unspecified whether esophagitis present On no prescription meds; takes OTC meds PRN, but does not have to use them often.  4. Diverticular disease of colon No recent flare ups.  5. Mild intermittent chronic asthma without complication Takes daily singulair. No recent problems.  6. Recurrent major depressive disorder, in full remission ([place]) Remains on paxil which works well for him; no adverse effects.    03/02/2022   11:35 AM 01/21/2022    8:38 AM 12/01/2021    3:27 PM 10/27/2021    1:20 PM 05/27/2021    2:51 PM  Depression screen PHQ 2/9  Decreased Interest 0 0 0 0 0  Down, Depressed, Hopeless  0 0 0 0  PHQ - 2 Score 0 0 0 0 0  Altered sleeping   0  1  Tired, decreased energy   0  1  Change in appetite   0  0  Feeling bad or failure about yourself    0  0  Trouble concentrating   0  1  Moving slowly or fidgety/restless   0  0  Suicidal thoughts   0  0  PHQ-9 Score   0  3  Difficult doing work/chores   Not  difficult at all  Not difficult at all     7. BMI 28.0-28.9,adult Weight is unchanged since last check. Wt Readings from Last 3 Encounters:  03/02/22 252 lb (114.3 kg)  02/04/22 249 lb 12.5 oz (113.3 kg)  01/21/22 252 lb (114.3 kg)   BMI Readings from Last 3 Encounters:  03/02/22 32.35 kg/m  02/04/22 32.07 kg/m  01/21/22 32.35 kg/m      New complaints: None today.  Allergies  Allergen Reactions   Fenofibrate Other (See Comments)    Pt reports causes joint pains   Rosuvastatin Other (See Comments)    Pt reports causes joint pains   Outpatient Encounter Medications as of 06/04/2022  Medication Sig   amLODipine (NORVASC) 5 MG tablet Take 1 tablet (5 mg total) by mouth daily.   aspirin 81 MG chewable tablet Chew 1 tablet (81 mg total) by mouth 2 (two) times daily.   atenolol (TENORMIN) 25 MG tablet TAKE 1 TABLET BY MOUTH EVERY DAY   budesonide-formoterol (SYMBICORT) 160-4.5 MCG/ACT inhaler Inhale 2 puffs into the lungs 2 (two) times daily.   Cholecalciferol (VITAMIN D3) 25 MCG (1000 UT) CAPS Take 1,000 Units by mouth daily.  citalopram (CELEXA) 20 MG tablet Take 1 tablet (20 mg total) by mouth daily.   cyanocobalamin (,VITAMIN B-12,) 1000 MCG/ML injection Inject 1 mL (1,000 mcg total) into the muscle every 30 (thirty) days.   hydrocortisone 1 % lotion Apply 1 Application topically 2 (two) times daily.   loratadine (CLARITIN) 10 MG tablet Take 10 mg by mouth daily as needed for allergies.   montelukast (SINGULAIR) 10 MG tablet Take 1 tablet (10 mg total) by mouth daily.   naproxen (NAPROSYN) 500 MG tablet TAKE 1 TABLET DAILY   pravastatin (PRAVACHOL) 20 MG tablet Take 0.5 tablets (10 mg total) by mouth daily.   predniSONE (DELTASONE) 20 MG tablet Take 1 tablet (20 mg total) by mouth daily with breakfast.   sildenafil (REVATIO) 20 MG tablet Take 20 mg by mouth as needed.   No facility-administered encounter medications on file as of 06/04/2022.    Past Surgical History:   Procedure Laterality Date   CARDIAC CATHETERIZATION  1997   EXCISION MASS NECK Left 11/12/2020   Procedure: EXCISION LEFT POSTERIOR NECK MASS;  Surgeon: Coralie Keens, MD;  Location: [facility];  Service: General;  Laterality: Left;   EXCISION METACARPAL MASS Left 02/04/2022   Procedure: LEFT THUMB EXCISION MASS;  Surgeon: Sherilyn Cooter, MD;  Location: [facility];  Service: Orthopedics;  Laterality: Left;   EYE SURGERY Bilateral 2020   cataracts removed   HERNIA REPAIR     HIATAL HERNIA REPAIR     LAPAROSCOPIC GASTRIC BANDING  5697   [facility] fundoplication   MENISCUS REPAIR Left 2012   ROBOT ASSISTED LAPAROSCOPIC RADICAL PROSTATECTOMY     ROTATOR CUFF REPAIR Right 2019   TOTAL KNEE ARTHROPLASTY Right 01/03/2021   Procedure: RIGHT TOTAL KNEE ARTHROPLASTY;  Surgeon: Mcarthur Rossetti, MD;  Location: [facility];  Service: Orthopedics;  Laterality: Right;  Needs RNFA   TOTAL KNEE ARTHROPLASTY Left 04/25/2021   Procedure: LEFT TOTAL KNEE ARTHROPLASTY;  Surgeon: Mcarthur Rossetti, MD;  Location: [facility];  Service: Orthopedics;  Laterality: Left;    Family History  Problem Relation Age of Onset   Heart disease Other        Early Cardiac disease for uncle is their 85's as well as deceased.   Heart failure Mother    Dementia Mother 27   Cancer Father 82       testicular, lung   Esophageal cancer Brother       Controlled substance contract: N/A     Review of Systems  Constitutional:  Negative for diaphoresis.  Eyes:  Negative for pain.  Respiratory:  Negative for shortness of breath.   Cardiovascular:  Negative for chest pain, palpitations and leg swelling.  Gastrointestinal:  Negative for abdominal pain.  Endocrine: Negative for polydipsia.  Skin:  Negative for rash.  Neurological:  Negative for dizziness, light-headedness and headaches.  Hematological:  Does not bruise/bleed easily.  All other systems reviewed and are negative.       Objective:   Physical Exam Vitals and nursing note reviewed.  Constitutional:      General: He is not in acute distress.    Appearance: Normal appearance. He is well-developed and well-groomed.  HENT:     Head: Normocephalic and atraumatic.     Right Ear: Tympanic membrane normal.     Left Ear: Tympanic membrane normal.     Nose: Nose normal.     Mouth/Throat:     Mouth: Mucous membranes are moist.  Pharynx: Oropharynx is clear.  Eyes:     Conjunctiva/sclera: Conjunctivae normal.     Pupils: Pupils are equal, round, and reactive to light.  Neck:     Thyroid: No thyroid mass, thyromegaly or thyroid tenderness.     Vascular: No carotid bruit or JVD.  Cardiovascular:     Rate and Rhythm: Normal rate and regular rhythm.     Pulses: Normal pulses.     Heart sounds: Normal heart sounds.  Pulmonary:     Effort: Pulmonary effort is normal.     Breath sounds: Normal breath sounds. No wheezing, rhonchi or rales.  Chest:     Chest wall: No tenderness.  Abdominal:     General: Bowel sounds are normal. There is no distension.     Palpations: Abdomen is soft. There is no mass.     Tenderness: There is no abdominal tenderness.  Musculoskeletal:        General: Normal range of motion.     Cervical back: Normal range of motion and neck supple.     Right lower leg: No edema.     Left lower leg: No edema.  Lymphadenopathy:     Cervical: No cervical adenopathy.  Skin:    General: Skin is warm and dry.  Neurological:     Mental Status: He is alert and oriented to person, place, and time.     Motor: No weakness.  Psychiatric:        Mood and Affect: Mood normal.        Behavior: Behavior normal.        Thought Content: Thought content normal.        Judgment: Judgment normal.   BP 129/63   Pulse 62   Temp 98.2 F (36.8 C) (Temporal)   Resp 20   Ht _0  (1.88 m)   Wt 252 lb (114.3 kg)   SpO2 94%   BMI 32.35 kg/m  A1c 5.7%        Assessment & Plan:   [name] [name]  comes in today with chief complaint of Medical Management of Chronic Issues   Diagnosis and orders addressed:  1. Essential hypertension Low sodium diet - CBC with Differential/Platelet - CMP14+EGFR - atenolol (TENORMIN) 25 MG tablet; Take 1 tablet (25 mg total) by mouth daily.  Dispense: 90 tablet; Refill: 1 - amLODipine (NORVASC) 5 MG tablet; Take 1 tablet (5 mg total) by mouth daily.  Dispense: 90 tablet; Refill: 1 - budesonide-formoterol (SYMBICORT) 160-4.5 MCG/ACT inhaler; Inhale 2 puffs into the lungs 2 (two) times daily.  Dispense: 3 each; Refill: 3  2. Mixed hyperlipidemia Low fat diet - Lipid panel - pravastatin (PRAVACHOL) 20 MG tablet; Take 0.5 tablets (10 mg total) by mouth daily.  Dispense: 90 tablet; Refill: 1  3. Gastroesophageal reflux disease, unspecified whether esophagitis present Avoid spicy foods. Avoid eating 2 hours before bedtime.  4. Diverticular disease of colon Watch diet to prevent flare ups  5. Mild intermittent chronic asthma without complication  - montelukast (SINGULAIR) 10 MG tablet; Take 1 tablet (10 mg total) by mouth daily.  Dispense: 90 tablet; Refill: 1  6. Recurrent major depressive disorder, in full remission ([place]) Stress management - citalopram (CELEXA) 20 MG tablet; Take 1 tablet (20 mg total) by mouth daily.  Dispense: 90 tablet; Refill: 1  7. BMI 28.0-28.9,adult Discussed diet and exercise for person with BMI>25. Will recheck weight in 3-6 months  8. Elevated blood sugar Labs reviewed at appointment. A1c 5.7% -  [facility] DCA Hb A1c Waived   Labs pending Health Maintenance reviewed Diet and exercise encouraged  Follow up plan: 6 months  Collene Leyden, FNP student   [place], [place]

## 2022-06-05 LAB — CMP14+EGFR
ALT: 16 IU/L (ref 0–44)
AST: 18 IU/L (ref 0–40)
Albumin/Globulin Ratio: 1.6 (ref 1.2–2.2)
Albumin: 4.2 g/dL (ref 3.8–4.8)
Alkaline Phosphatase: 79 IU/L (ref 44–121)
BUN/Creatinine Ratio: 14 (ref 10–24)
BUN: 20 mg/dL (ref 8–27)
Bilirubin Total: 0.4 mg/dL (ref 0.0–1.2)
CO2: 23 mmol/L (ref 20–29)
Calcium: 9.8 mg/dL (ref 8.6–10.2)
Chloride: 98 mmol/L (ref 96–106)
Creatinine, Ser: 1.42 mg/dL — ABNORMAL HIGH (ref 0.76–1.27)
Globulin, Total: 2.6 g/dL (ref 1.5–4.5)
Glucose: 154 mg/dL — ABNORMAL HIGH (ref 70–99)
Potassium: 5 mmol/L (ref 3.5–5.2)
Sodium: 137 mmol/L (ref 134–144)
Total Protein: 6.8 g/dL (ref 6.0–8.5)
eGFR: 52 mL/min/{1.73_m2} — ABNORMAL LOW (ref >59)

## 2022-06-05 LAB — CBC WITH DIFFERENTIAL/PLATELET
Basophils Absolute: 0.1 10*3/uL (ref 0.0–0.2)
Basos: 1 %
EOS (ABSOLUTE): 0.1 10*3/uL (ref 0.0–0.4)
Eos: 1 %
Hematocrit: 42.5 % (ref 37.5–51.0)
Hemoglobin: 13.9 g/dL (ref 13.0–17.7)
Immature Grans (Abs): 0 10*3/uL (ref 0.0–0.1)
Immature Granulocytes: 0 %
Lymphocytes Absolute: 3.3 10*3/uL — ABNORMAL HIGH (ref 0.7–3.1)
Lymphs: 36 %
MCH: 31.4 pg (ref 26.6–33.0)
MCHC: 32.7 g/dL (ref 31.5–35.7)
MCV: 96 fL (ref 79–97)
Monocytes Absolute: 0.4 10*3/uL (ref 0.1–0.9)
Monocytes: 5 %
Neutrophils Absolute: 5.2 10*3/uL (ref 1.4–7.0)
Neutrophils: 57 %
Platelets: 208 10*3/uL (ref 150–450)
RBC: 4.42 x10E6/uL (ref 4.14–5.80)
RDW: 13.1 % (ref 11.6–15.4)
WBC: 9.1 10*3/uL (ref 3.4–10.8)

## 2022-06-05 LAB — LIPID PANEL
Chol/HDL Ratio: 2.9 ratio (ref 0.0–5.0)
Cholesterol, Total: 135 mg/dL (ref 100–199)
HDL: 46 mg/dL (ref >39)
LDL Chol Calc (NIH): 59 mg/dL (ref 0–99)
Triglycerides: 182 mg/dL — ABNORMAL HIGH (ref 0–149)
VLDL Cholesterol Cal: 30 mg/dL (ref 5–40)

## 2022-06-23 ENCOUNTER — Other Ambulatory Visit: Payer: Self-pay | Admitting: Nurse Practitioner

## 2022-07-31 ENCOUNTER — Ambulatory Visit (INDEPENDENT_AMBULATORY_CARE_PROVIDER_SITE_OTHER): Payer: Medicare Other

## 2022-07-31 ENCOUNTER — Encounter: Payer: Self-pay | Admitting: Family Medicine

## 2022-07-31 ENCOUNTER — Ambulatory Visit (INDEPENDENT_AMBULATORY_CARE_PROVIDER_SITE_OTHER): Payer: Medicare Other | Admitting: Family Medicine

## 2022-07-31 VITALS — BP 127/65 | HR 60 | Temp 97.8°F | Ht 74.0 in | Wt 250.0 lb

## 2022-07-31 DIAGNOSIS — R300 Dysuria: Secondary | ICD-10-CM | POA: Diagnosis not present

## 2022-07-31 DIAGNOSIS — R5381 Other malaise: Secondary | ICD-10-CM | POA: Diagnosis not present

## 2022-07-31 DIAGNOSIS — R5383 Other fatigue: Secondary | ICD-10-CM | POA: Diagnosis not present

## 2022-07-31 DIAGNOSIS — R109 Unspecified abdominal pain: Secondary | ICD-10-CM | POA: Diagnosis not present

## 2022-07-31 LAB — URINALYSIS, ROUTINE W REFLEX MICROSCOPIC
Bilirubin, UA: NEGATIVE
Glucose, UA: NEGATIVE
Ketones, UA: NEGATIVE
Leukocytes,UA: NEGATIVE
Nitrite, UA: NEGATIVE
Protein,UA: NEGATIVE
RBC, UA: NEGATIVE
Specific Gravity, UA: 1.015 (ref 1.005–1.030)
Urobilinogen, Ur: 0.2 mg/dL (ref 0.2–1.0)
pH, UA: 5.5 (ref 5.0–7.5)

## 2022-07-31 MED ORDER — KETOROLAC TROMETHAMINE 30 MG/ML IJ SOLN: 30.0000 mg | Freq: Once | INTRAMUSCULAR | Status: DC

## 2022-07-31 MED ORDER — KETOROLAC TROMETHAMINE 30 MG/ML IJ SOLN
30.0000 mg | Freq: Once | INTRAMUSCULAR | Status: AC
  Administered 2022-07-31: 30 mg via INTRAMUSCULAR

## 2022-07-31 NOTE — Progress Notes (Signed)
Subjective:  Patient ID: [name] [name], male    DOB: [birth year], 76 y.o.   MRN: [id number]  Patient Care Team: Chevis Pretty, FNP as PCP - General (Family Medicine) Mcarthur Rossetti, MD as Consulting Physician (Orthopedic Surgery) Harlen Labs, MD as Referring Physician (Optometry) Jessy Oto, MD (Inactive) as Consulting Physician (Orthopedic Surgery) Irine Seal, MD as Attending Physician (Urology) Freada Bergeron, MD as Consulting Physician (Cardiology)   Chief Complaint:  Back Pain (Right lower back pain x 4 days. Been using ice and heat with no results. )   HPI: [name] [name] is a 76 y.o. male presenting on 07/31/2022 for Back Pain (Right lower back pain x 4 days. Been using ice and heat with no results. )   Back Pain This is a new problem. Episode onset: 4-5 days ago. The problem occurs constantly. The problem has been waxing and waning since onset. Pain location: right flank, right lower back. Radiates to: right groin. The pain is moderate. The symptoms are aggravated by lying down and position. Associated symptoms include abdominal pain (right groin) and dysuria. Pertinent negatives include no bladder incontinence, bowel incontinence, chest pain, fever, headaches, leg pain, numbness, paresis, paresthesias, pelvic pain, perianal numbness, tingling, weakness or weight loss. (Fatigue, malaise) He has tried heat and ice for the symptoms. The treatment provided no relief.    Relevant past medical, surgical, family, and social history reviewed and updated as indicated.  Allergies and medications reviewed and updated. Data reviewed: Chart in Epic.   Past Medical History:  Diagnosis Date   Arthritis    Asthma, chronic 06/02/2013   Carpal tunnel syndrome 04/17/2015   Bilateral   COPD (chronic obstructive pulmonary disease) ([place])    Depression 06/02/2013   DYSPNEA ON EXERTION 06/04/2009   Qualifier: Diagnosis of  By: Domenic Polite, MD, [place]    Hereditary and idiopathic peripheral neuropathy 04/17/2015   Hyperlipemia 06/02/2013   Hypertension    LEG PAIN, BILATERAL 06/04/2009   Qualifier: Diagnosis of  By: Domenic Polite, MD, FACC, [place] CORONARY ANGIOPLASTY, HX OF 06/04/2009   Qualifier: Diagnosis of  By: Domenic Polite, MD, [place]    Prostate CA Revision [facility])    and basal cell nose    Past Surgical History:  Procedure Laterality Date   CARDIAC CATHETERIZATION  1997   EXCISION MASS NECK Left 11/12/2020   Procedure: EXCISION LEFT POSTERIOR NECK MASS;  Surgeon: Coralie Keens, MD;  Location: [place];  Service: General;  Laterality: Left;   EXCISION METACARPAL MASS Left 02/04/2022   Procedure: LEFT THUMB EXCISION MASS;  Surgeon: Sherilyn Cooter, MD;  Location: [place];  Service: Orthopedics;  Laterality: Left;   EYE SURGERY Bilateral 2020   cataracts removed   HERNIA REPAIR     HIATAL HERNIA REPAIR     LAPAROSCOPIC GASTRIC BANDING  7017   [facility] fundoplication   MENISCUS REPAIR Left 2012   ROBOT ASSISTED LAPAROSCOPIC RADICAL PROSTATECTOMY     ROTATOR CUFF REPAIR Right 2019   TOTAL KNEE ARTHROPLASTY Right 01/03/2021   Procedure: RIGHT TOTAL KNEE ARTHROPLASTY;  Surgeon: Mcarthur Rossetti, MD;  Location: [facility];  Service: Orthopedics;  Laterality: Right;  Needs RNFA   TOTAL KNEE ARTHROPLASTY Left 04/25/2021   Procedure: LEFT TOTAL KNEE ARTHROPLASTY;  Surgeon: Mcarthur Rossetti, MD;  Location: [facility];  Service: Orthopedics;  Laterality: Left;    Social History   Socioeconomic History  Marital status: Married    Spouse name: [name]   Number of children: 3   Years of education: 18   Highest education level: Master's degree (e.g., MA, MS, MEng, MEd, MSW, MBA)  Occupational History   Occupation: Retired    Comment: Nature conservation officer  Tobacco Use   Smoking status: Former    Packs/day: 1.00    Years: 20.00    Total pack years: 20.00     Types: Cigarettes    Quit date: 01/18/1993    Years since quitting: 29.5   Smokeless tobacco: Never  Vaping Use   Vaping Use: Never used  Substance and Sexual Activity   Alcohol use: Yes    Alcohol/week: 2.0 standard drinks of alcohol    Types: 2 Cans of beer per week    Comment: occasional   Drug use: No   Sexual activity: Yes  Other Topics Concern   Not on file  Social History Narrative   Patient is married and lives at home with is wife.    He has three adult children and five grandchildren.    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    Social Determinants of Health   Financial Resource Strain: Low Risk  (10/27/2021)   Overall Financial Resource Strain (CARDIA)    Difficulty of Paying Living Expenses: Not hard at all  Food Insecurity: No Food Insecurity (10/27/2021)   Hunger Vital Sign    Worried About Running Out of Food in the Last Year: Never true    Ran Out of Food in the Last Year: Never true  Transportation Needs: No Transportation Needs (10/27/2021)   PRAPARE - Hydrologist (Medical): No    Lack of Transportation (Non-Medical): No  Physical Activity: Sufficiently Active (10/27/2021)   Exercise Vital Sign    Days of Exercise per Week: 7 days    Minutes of Exercise per Session: 30 min  Stress: No Stress Concern Present (10/27/2021)   [place]    Feeling of Stress : Not at all  Social Connections: Moderately Integrated (10/27/2021)   Social Connection and Isolation Panel [NHANES]    Frequency of Communication with Friends and Family: Not on file    Frequency of Social Gatherings with Friends and Family: More than three times a week    Attends Religious Services: Never    Marine scientist or Organizations: Yes    Attends Music therapist: More than 4 times per year    Marital Status: Married  Human resources officer Violence: Not At  Risk (10/27/2021)   Humiliation, Afraid, Rape, and Kick questionnaire    Fear of Current or Ex-Partner: No    Emotionally Abused: No    Physically Abused: No    Sexually Abused: No    Outpatient Encounter Medications as of 07/31/2022  Medication Sig   amLODipine (NORVASC) 5 MG tablet Take 1 tablet (5 mg total) by mouth daily.   aspirin 81 MG chewable tablet Chew 1 tablet (81 mg total) by mouth 2 (two) times daily.   atenolol (TENORMIN) 25 MG tablet Take 1 tablet (25 mg total) by mouth daily.   budesonide-formoterol (SYMBICORT) 160-4.5 MCG/ACT inhaler Inhale 2 puffs into the lungs 2 (two) times daily.   Cholecalciferol (VITAMIN D3) 25 MCG (1000 UT) CAPS Take 1,000 Units by mouth daily.   citalopram (CELEXA) 20 MG tablet Take 1 tablet (20 mg total) by mouth  daily.   cyanocobalamin (VITAMIN B12) 1000 MCG/ML injection Inject 1 mL (1,000 mcg total) into the muscle every 30 (thirty) days.   hydrocortisone 1 % lotion Apply 1 Application topically 2 (two) times daily.   montelukast (SINGULAIR) 10 MG tablet Take 1 tablet (10 mg total) by mouth daily.   naproxen (NAPROSYN) 500 MG tablet TAKE 1 TABLET DAILY   pravastatin (PRAVACHOL) 20 MG tablet Take 0.5 tablets (10 mg total) by mouth daily.   sildenafil (REVATIO) 20 MG tablet Take 20 mg by mouth as needed.   loratadine (CLARITIN) 10 MG tablet Take 10 mg by mouth daily as needed for allergies. (Patient not taking: Reported on 07/31/2022)   No facility-administered encounter medications on file as of 07/31/2022.    Allergies  Allergen Reactions   Fenofibrate Other (See Comments)    Pt reports causes joint pains   Rosuvastatin Other (See Comments)    Pt reports causes joint pains    Review of Systems  Constitutional:  Positive for activity change, appetite change and fatigue. Negative for chills, diaphoresis, fever, unexpected weight change and weight loss.  Respiratory:  Negative for cough and shortness of breath.   Cardiovascular:  Negative  for chest pain.  Gastrointestinal:  Positive for abdominal pain (right groin). Negative for abdominal distention, anal bleeding, blood in stool, bowel incontinence, constipation, diarrhea, nausea, rectal pain and vomiting.  Genitourinary:  Positive for dysuria and flank pain. Negative for bladder incontinence, decreased urine volume, difficulty urinating, enuresis, frequency, genital sores, hematuria, pelvic pain, penile discharge, penile pain, penile swelling, scrotal swelling, testicular pain and urgency.  Musculoskeletal:  Positive for back pain. Negative for arthralgias, gait problem, joint swelling, myalgias, neck pain and neck stiffness.  Neurological:  Negative for tingling, weakness, numbness, headaches and paresthesias.  Psychiatric/Behavioral:  Negative for confusion.         Objective:  BP 127/65   Pulse 60   Temp 97.8 F (36.6 C) (Temporal)   Ht _0  (1.88 m)   Wt 250 lb (113.4 kg)   SpO2 98%   BMI 32.10 kg/m    Wt Readings from Last 3 Encounters:  07/31/22 250 lb (113.4 kg)  06/04/22 252 lb (114.3 kg)  03/02/22 252 lb (114.3 kg)    Physical Exam Vitals and nursing note reviewed.  Constitutional:      General: He is not in acute distress.    Appearance: Normal appearance. He is well-developed and well-groomed. He is obese. He is not ill-appearing, toxic-appearing or diaphoretic.  HENT:     Head: Normocephalic and atraumatic.     Jaw: There is normal jaw occlusion.     Right Ear: Hearing normal.     Left Ear: Hearing normal.     Nose: Nose normal.     Mouth/Throat:     Lips: Pink.     Mouth: Mucous membranes are moist.     Pharynx: Oropharynx is clear. Uvula midline.  Eyes:     General: Lids are normal.     Extraocular Movements: Extraocular movements intact.     Conjunctiva/sclera: Conjunctivae normal.     Pupils: Pupils are equal, round, and reactive to light.  Neck:     Thyroid: No thyroid mass, thyromegaly or thyroid tenderness.     Vascular: No  carotid bruit or JVD.     Trachea: Trachea and phonation normal.  Cardiovascular:     Rate and Rhythm: Normal rate and regular rhythm.     Chest Wall: PMI is not displaced.  Pulses: Normal pulses.     Heart sounds: Normal heart sounds. No murmur heard.    No friction rub. No gallop.  Pulmonary:     Effort: Pulmonary effort is normal. No respiratory distress.     Breath sounds: Normal breath sounds. No wheezing.  Abdominal:     General: Bowel sounds are normal. There is no distension or abdominal bruit.     Palpations: Abdomen is soft. There is no hepatomegaly, splenomegaly or mass.     Tenderness: There is no abdominal tenderness. There is no right CVA tenderness, left CVA tenderness, guarding or rebound.     Hernia: No hernia is present.  Musculoskeletal:        General: Normal range of motion.     Cervical back: Normal range of motion and neck supple.     Thoracic back: Normal.     Lumbar back: Tenderness present. No swelling, edema, deformity, signs of trauma, lacerations, spasms or bony tenderness. Normal range of motion. Negative right straight leg raise test and negative left straight leg raise test. No scoliosis.       Back:     Right hip: Normal.     Left hip: Normal.     Right lower leg: No edema.     Left lower leg: No edema.  Lymphadenopathy:     Cervical: No cervical adenopathy.  Skin:    General: Skin is warm and dry.     Capillary Refill: Capillary refill takes less than 2 seconds.     Coloration: Skin is not cyanotic, jaundiced or pale.     Findings: No rash.  Neurological:     General: No focal deficit present.     Mental Status: He is alert and oriented to person, place, and time.     Sensory: Sensation is intact.     Motor: Motor function is intact.     Coordination: Coordination is intact.     Gait: Gait is intact.     Deep Tendon Reflexes: Reflexes are normal and symmetric.  Psychiatric:        Attention and Perception: Attention and perception  normal.        Mood and Affect: Mood and affect normal.        Speech: Speech normal.        Behavior: Behavior normal. Behavior is cooperative.        Thought Content: Thought content normal.        Cognition and Memory: Cognition and memory normal.        Judgment: Judgment normal.     Results for orders placed or performed in visit on 06/04/22  Bayer DCA Hb A1c Waived  Result Value Ref Range   HB A1C (BAYER DCA - WAIVED) 5.7 (H) 4.8 - 5.6 %  CBC with Differential/Platelet  Result Value Ref Range   WBC 9.1 3.4 - 10.8 x10E3/uL   RBC 4.42 4.14 - 5.80 x10E6/uL   Hemoglobin 13.9 13.0 - 17.7 g/dL   Hematocrit 42.5 37.5 - 51.0 %   MCV 96 79 - 97 fL   MCH 31.4 26.6 - 33.0 pg   MCHC 32.7 31.5 - 35.7 g/dL   RDW 13.1 11.6 - 15.4 %   Platelets 208 150 - 450 x10E3/uL   Neutrophils 57 Not Estab. %   Lymphs 36 Not Estab. %   Monocytes 5 Not Estab. %   Eos 1 Not Estab. %   Basos 1 Not Estab. %   Neutrophils Absolute 5.2 1.4 -  7.0 x10E3/uL   Lymphocytes Absolute 3.3 (H) 0.7 - 3.1 x10E3/uL   Monocytes Absolute 0.4 0.1 - 0.9 x10E3/uL   EOS (ABSOLUTE) 0.1 0.0 - 0.4 x10E3/uL   Basophils Absolute 0.1 0.0 - 0.2 x10E3/uL   Immature Granulocytes 0 Not Estab. %   Immature Grans (Abs) 0.0 0.0 - 0.1 x10E3/uL  CMP14+EGFR  Result Value Ref Range   Glucose 154 (H) 70 - 99 mg/dL   BUN 20 8 - 27 mg/dL   Creatinine, Ser 1.42 (H) 0.76 - 1.27 mg/dL   eGFR 52 (L) >59 mL/min/1.73   BUN/Creatinine Ratio 14 10 - 24   Sodium 137 134 - 144 mmol/L   Potassium 5.0 3.5 - 5.2 mmol/L   Chloride 98 96 - 106 mmol/L   CO2 23 20 - 29 mmol/L   Calcium 9.8 8.6 - 10.2 mg/dL   Total Protein 6.8 6.0 - 8.5 g/dL   Albumin 4.2 3.8 - 4.8 g/dL   Globulin, Total 2.6 1.5 - 4.5 g/dL   Albumin/Globulin Ratio 1.6 1.2 - 2.2   Bilirubin Total 0.4 0.0 - 1.2 mg/dL   Alkaline Phosphatase 79 44 - 121 IU/L   AST 18 0 - 40 IU/L   ALT 16 0 - 44 IU/L  Lipid panel  Result Value Ref Range   Cholesterol, Total 135 100 - 199 mg/dL    Triglycerides 182 (H) 0 - 149 mg/dL   HDL 46 >39 mg/dL   VLDL Cholesterol Cal 30 5 - 40 mg/dL   LDL Chol Calc (NIH) 59 0 - 99 mg/dL   Chol/HDL Ratio 2.9 0.0 - 5.0 ratio     X-Ray: KUB: surgical clips noted, no obvious renal stones. No acute findings. Preliminary x-ray reading by Monia Pouch, FNP-C, [facility].   Pertinent labs & imaging results that were available during my care of the patient were reviewed by me and considered in my medical decision making.  Assessment & Plan:  Janice was seen today for back pain.  Diagnoses and all orders for this visit:  Right flank pain Dysuria  No acute findings on KUB. Urinalysis unremarkable. Will check below labs. Toradol in office. Pt aware to report new, worsening, or persistent symptoms. Follow up if not improving.  -     Urinalysis, Routine w reflex microscopic -     DG Abd 1 View -     CBC with Differential/Platelet -     CMP14+EGFR  Malaise and fatigue Will check below labs to check for potential underlying causes.  -     CBC with Differential/Platelet -     CMP14+EGFR     Continue all other maintenance medications.  Follow up plan: Return if symptoms worsen or fail to improve.   Continue healthy lifestyle choices, including diet (rich in fruits, vegetables, and lean proteins, and low in salt and simple carbohydrates) and exercise (at least 30 minutes of moderate physical activity daily).  Educational handout given for flank pain  The above assessment and management plan was discussed with the patient. The patient verbalized understanding of and has agreed to the management plan. Patient is aware to call the clinic if they develop any new symptoms or if symptoms persist or worsen. Patient is aware when to return to the clinic for a follow-up visit. Patient educated on when it is appropriate to go to the emergency department.   Monia Pouch, FNP-C [facility] [phone]

## 2022-08-01 LAB — CBC WITH DIFFERENTIAL/PLATELET
Basophils Absolute: 0.1 10*3/uL (ref 0.0–0.2)
Basos: 1 %
EOS (ABSOLUTE): 0.2 10*3/uL (ref 0.0–0.4)
Eos: 2 %
Hematocrit: 43.3 % (ref 37.5–51.0)
Hemoglobin: 14.6 g/dL (ref 13.0–17.7)
Immature Grans (Abs): 0 10*3/uL (ref 0.0–0.1)
Immature Granulocytes: 0 %
Lymphocytes Absolute: 3.7 10*3/uL — ABNORMAL HIGH (ref 0.7–3.1)
Lymphs: 44 %
MCH: 31.3 pg (ref 26.6–33.0)
MCHC: 33.7 g/dL (ref 31.5–35.7)
MCV: 93 fL (ref 79–97)
Monocytes Absolute: 0.5 10*3/uL (ref 0.1–0.9)
Monocytes: 6 %
Neutrophils Absolute: 4 10*3/uL (ref 1.4–7.0)
Neutrophils: 47 %
Platelets: 219 10*3/uL (ref 150–450)
RBC: 4.66 x10E6/uL (ref 4.14–5.80)
RDW: 12.2 % (ref 11.6–15.4)
WBC: 8.5 10*3/uL (ref 3.4–10.8)

## 2022-08-01 LAB — CMP14+EGFR
ALT: 18 IU/L (ref 0–44)
AST: 19 IU/L (ref 0–40)
Albumin/Globulin Ratio: 1.8 (ref 1.2–2.2)
Albumin: 4.6 g/dL (ref 3.8–4.8)
Alkaline Phosphatase: 80 IU/L (ref 44–121)
BUN/Creatinine Ratio: 12 (ref 10–24)
BUN: 17 mg/dL (ref 8–27)
Bilirubin Total: 0.5 mg/dL (ref 0.0–1.2)
CO2: 21 mmol/L (ref 20–29)
Calcium: 9.6 mg/dL (ref 8.6–10.2)
Chloride: 97 mmol/L (ref 96–106)
Creatinine, Ser: 1.42 mg/dL — ABNORMAL HIGH (ref 0.76–1.27)
Globulin, Total: 2.5 g/dL (ref 1.5–4.5)
Glucose: 101 mg/dL — ABNORMAL HIGH (ref 70–99)
Potassium: 4.7 mmol/L (ref 3.5–5.2)
Sodium: 133 mmol/L — ABNORMAL LOW (ref 134–144)
Total Protein: 7.1 g/dL (ref 6.0–8.5)
eGFR: 52 mL/min/{1.73_m2} — ABNORMAL LOW (ref >59)

## 2022-08-18 NOTE — Progress Notes (Unsigned)
Cardiology Office Note:    Date:  08/18/2022   ID:  [name] [name], DOB 1946-06-26, MRN [id number]  PCP:  [name] [name], [name] [name]  Cardiologist:  None  Advanced Practice Provider:  No care team member to display Electrophysiologist:  None   Referring [name]: [name] [name], [name] [name], *    History [name] Present Illness:    [name] [name] is a 76 y.o. male with a hx [name] COPD, hyperlipidemia, prior history [name] prostate cancer now in remission, basal cell carcinoma s/p excision and palpitations who was previously followed by Dr. Meda [name] who now returns to clinic for follow-up.  Seen in clinic by me on 11/19/20 prior to right TKA. We obtained myoview due to limited mobility in the setting [name] severe arthritis which showed no evidence [name] ischemia or infarction. He was cleared for his surgery.  Was last seen in clinic in 07/2021 where he was doing well from a CV standpoint.   Today, ***  Past Medical History:  Diagnosis Date   Arthritis    Asthma, chronic 06/02/2013   Carpal tunnel syndrome 04/17/2015   Bilateral   COPD (chronic obstructive pulmonary disease) ([place])    Depression 06/02/2013   DYSPNEA ON EXERTION 06/04/2009   Qualifier: Diagnosis [name]  By: [name] [name], [name] [name]    Hereditary and idiopathic peripheral neuropathy 04/17/2015   Hyperlipemia 06/02/2013   Hypertension    LEG PAIN, BILATERAL 06/04/2009   Qualifier: Diagnosis [name]  By: [name] [name], [name] [name] 06/04/2009   Qualifier: Diagnosis [name]  By: [name] [name], [name] [name]    Prostate CA [facility])    and basal cell nose    Past Surgical History:  Procedure Laterality Date   CARDIAC CATHETERIZATION  1997   EXCISION MASS NECK Left 11/12/2020   Procedure: EXCISION LEFT POSTERIOR NECK MASS;  Surgeon: Coralie Keens, [name];  Location: [place];  Service: General;  Laterality: Left;   EXCISION  METACARPAL MASS Left 02/04/2022   Procedure: LEFT THUMB EXCISION MASS;  Surgeon: Sherilyn Cooter, [name];  Location: [place];  Service: Orthopedics;  Laterality: Left;   EYE SURGERY Bilateral 2020   cataracts removed   HERNIA REPAIR     HIATAL HERNIA REPAIR     LAPAROSCOPIC GASTRIC BANDING  5643   [facility] fundoplication   MENISCUS REPAIR Left 2012   ROBOT ASSISTED LAPAROSCOPIC RADICAL PROSTATECTOMY     ROTATOR CUFF REPAIR Right 2019   TOTAL KNEE ARTHROPLASTY Right 01/03/2021   Procedure: RIGHT TOTAL KNEE ARTHROPLASTY;  Surgeon: Mcarthur Rossetti, [name];  [facility];  Service: Orthopedics;  Laterality: Right;  Needs RNFA   TOTAL KNEE ARTHROPLASTY Left 04/25/2021   Procedure: LEFT TOTAL KNEE ARTHROPLASTY;  Surgeon: Mcarthur Rossetti, [name];  [facility];  Service: Orthopedics;  Laterality: Left;    Current Medications: No outpatient medications have been marked as taking for the 08/20/22 encounter (Appointment) with [name] Bergeron, [name].     Allergies:   Fenofibrate and Rosuvastatin   Social History   Socioeconomic History   Marital status: Married    Spouse name: [name] [name]   Number [name] children: 3   Years [name] education: 18   Highest education level: Master's degree (e.g., MA, MS, MEng, MEd, MSW, MBA)  Occupational History   Occupation: Retired    Comment: Nature conservation officer  Tobacco Use   Smoking status: Former    Packs/day: 1.00  Years: 20.00    Total pack years: 20.00    Types: Cigarettes    Quit date: 01/18/1993    Years since quitting: 29.6   Smokeless tobacco: Never  Vaping Use   Vaping Use: Never used  Substance and Sexual Activity   Alcohol use: Yes    Alcohol/week: 2.0 standard drinks [name] alcohol    Types: 2 Cans [name] beer per week    Comment: occasional   Drug use: No   Sexual activity: Yes  Other Topics Concern   Not on file  Social History Narrative   Patient is married and lives at home with is wife.    He has three adult children and  five grandchildren.    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    Social Determinants [name] Health   Financial Resource Strain: Low Risk  (10/27/2021)   Overall Financial Resource Strain (CARDIA)    Difficulty [name] Paying Living Expenses: Not hard at all  Food Insecurity: No Food Insecurity (10/27/2021)   Hunger Vital Sign    Worried About Running Out [name] Food in the Last Year: Never true    Ran Out [name] Food in the Last Year: Never true  Transportation Needs: No Transportation Needs (10/27/2021)   PRAPARE - Hydrologist (Medical): No    Lack [name] Transportation (Non-Medical): No  Physical Activity: Sufficiently Active (10/27/2021)   Exercise Vital Sign    Days [name] Exercise per Week: 7 days    Minutes [name] Exercise per Session: 30 min  Stress: No Stress Concern Present (10/27/2021)   [place]    Feeling [name] Stress : Not at all  Social Connections: Moderately Integrated (10/27/2021)   Social Connection and Isolation Panel [NHANES]    Frequency [name] Communication with Friends and Family: Not on file    Frequency [name] Social Gatherings with Friends and Family: More than three times a week    Attends Religious Services: Never    Marine scientist or Organizations: Yes    Attends Music therapist: More than 4 times per year    Marital Status: Married     Family History: The patient's family history includes Cancer (age [name] onset: 42) in his father; Dementia (age [name] onset: 12) in his mother; Esophageal cancer in his brother; Heart disease in an other family member; Heart failure in his mother.  ROS:   Please see the history [name] present illness.    Review [name] Systems  Constitutional:  Negative for chills and fever.  HENT:  Negative for hearing loss and sore throat.   Eyes:  Negative for blurred vision and redness.  Respiratory:  Positive for shortness [name] breath.    Cardiovascular:  Negative for chest pain, palpitations, orthopnea, claudication, leg swelling and PND.  Gastrointestinal:  Negative for melena, nausea and vomiting.  Genitourinary:  Negative for dysuria and flank pain.  Musculoskeletal:  Positive for joint pain and myalgias.  Neurological:  Positive for tingling. Negative for dizziness and loss [name] consciousness.  Endo/Heme/Allergies:  Negative for polydipsia. Bruises/bleeds easily.  Psychiatric/Behavioral:  Negative for substance abuse.     EKGs/Labs/Other Studies Reviewed:    The following studies were reviewed today: Myoview Lexiscan 11/25/20 The left ventricular ejection fraction is mildly decreased (45-54%). Nuclear stress EF: 50%. Mild global hypokinesis. There was no ST segment deviation noted during stress. This is a low risk study.  There is no evidence [name] old infarct or ischemia pattern. Apical thinning noted, normal variant. Chamber size mildly enlarged.  TTE 2014: Study Conclusions   - Left ventricle: The cavity size was normal. Wall thickness    was increased in a pattern [name] moderate LVH. Systolic    function was normal. The estimated ejection fraction was    in the range [name] 50% to 55%. Wall motion was normal; there    were no regional wall motion abnormalities. Doppler    parameters are consistent with abnormal left ventricular    relaxation (grade 1 diastolic dysfunction).  - Left atrium: The atrium was mildly dilated.    Myoview 2014: Negative for ischemia, normal LVEF  EKG:   08/05/21: NSR with LAFB, IVCD 11/19/20: NSR with LAFB, IVCD.  Recent Labs: 07/31/2022: ALT 18; BUN 17; Creatinine, Ser 1.42; Hemoglobin 14.6; Platelets 219; Potassium 4.7; Sodium 133  Recent Lipid Panel    Component Value Date/Time   CHOL 135 06/04/2022 1424   TRIG 182 (H) 06/04/2022 1424   TRIG 244 (H) 10/24/2014 1201   HDL 46 06/04/2022 1424   HDL 43 10/24/2014 1201   CHOLHDL 2.9 06/04/2022 1424   CHOLHDL 7.0 (H) 08/31/2016 0800    VLDL 65 (H) 08/31/2016 0800   LDLCALC 59 06/04/2022 1424   LDLCALC 81 01/11/2014 0840   LDLDIRECT 92.8 08/28/2014 1041      Physical Exam:    VS:  There were no vitals taken for this visit.    Wt Readings from Last 3 Encounters:  07/31/22 250 lb (113.4 kg)  06/04/22 252 lb (114.3 kg)  03/02/22 252 lb (114.3 kg)     GEN:  Well nourished, well developed in no acute distress HEENT: Normal NECK: No JVD; No carotid bruits CARDIAC: RRR, no murmurs, rubs, gallops RESPIRATORY:  Clear to auscultation without rales, wheezing or rhonchi  ABDOMEN: Soft, non-tender, non-distended MUSCULOSKELETAL:  No edema; No deformity  SKIN: Warm and dry, large right arm ecchymosis NEUROLOGIC:  Alert and oriented x 3 PSYCHIATRIC:  Normal affect   ASSESSMENT:    No diagnosis found.   PLAN:    In order [name] problems listed above:  #Palpitations : Resolved with no current symptoms. E-cardio monitor with 0% Afib. TTE with mild LA enlargement. On ASA. -Continue to monitor -Continue ASA '81mg'$  daily -Continue atenolol '25mg'$  daily  #HTN:  Well controlled and at goal <120s/80s. -Continue amlodipine '5mg'$  -Continue atenolol '25mg'$    #HLD: Intolerant to simva, atorva and crestor. LDL 59 on 05/2022. -Continue pravastatin '10mg'$  -Continue zetia '10mg'$  daily -Goal LDL <100  Medication Adjustments/Labs and Tests Ordered: Current medicines are reviewed at length with the patient today.  Concerns regarding medicines are outlined above.  No orders [name] the defined types were placed in this encounter.   No orders [name] the defined types were placed in this encounter.   There are no Patient Instructions on file for this visit.  I,Mykaella Javier,acting as a scribe for [name] Bergeron, [name].,have documented all relevant documentation on the behalf [name] [name] Bergeron, [name],as directed by  [name] Bergeron, [name] while in the presence [name] [name] Bergeron, [name].  I, [name] Bergeron, [name], have reviewed all  documentation for this visit. The documentation on 08/18/22 for the exam, diagnosis, procedures, and orders are all accurate and complete.   Signed, [name] Bergeron, [name]  08/18/2022 2:41 PM    [place]

## 2022-08-20 ENCOUNTER — Ambulatory Visit: Payer: Medicare Other | Attending: Cardiology | Admitting: Cardiology

## 2022-08-20 ENCOUNTER — Encounter: Payer: Self-pay | Admitting: Cardiology

## 2022-08-20 VITALS — BP 134/70 | HR 58 | Ht 74.0 in | Wt 250.0 lb

## 2022-08-20 DIAGNOSIS — R002 Palpitations: Secondary | ICD-10-CM

## 2022-08-20 DIAGNOSIS — Z789 Other specified health status: Secondary | ICD-10-CM | POA: Diagnosis not present

## 2022-08-20 DIAGNOSIS — R0602 Shortness of breath: Secondary | ICD-10-CM

## 2022-08-20 DIAGNOSIS — I10 Essential (primary) hypertension: Secondary | ICD-10-CM | POA: Diagnosis not present

## 2022-08-20 DIAGNOSIS — R0609 Other forms of dyspnea: Secondary | ICD-10-CM | POA: Diagnosis not present

## 2022-08-20 DIAGNOSIS — E782 Mixed hyperlipidemia: Secondary | ICD-10-CM

## 2022-08-20 NOTE — Progress Notes (Signed)
Cardiology Office Note:    Date:  08/20/2022   ID:  [name] [name], DOB 11/15/1945, MRN [id number]  PCP:  Chevis Pretty, [place]  Cardiologist:  None  Advanced Practice Provider:  No care team member to display Electrophysiologist:  None   Referring MD: Hassell Done, Mary-Margaret, *    History of Present Illness:    [name] [name] is a 76 y.o. male with a hx of COPD, hyperlipidemia, prior history of prostate cancer now in remission, basal cell carcinoma s/p excision and palpitations who was previously followed by Dr. [name] who now returns to clinic for follow-up.  Seen in clinic by me on 11/19/20 prior to right TKA. We obtained myoview due to limited mobility in the setting of severe arthritis which showed no evidence of ischemia or infarction. He was cleared for his surgery.  Was last seen in clinic in 07/2021 where he was doing well from a CV standpoint.   Today, the patient states that he is becoming fatigued and short of breath very easily. In the Winter he aims for a minimum of 2500 steps a day. In the Summer he is able to complete 3500+ steps daily. No exertional chest pain. States this is different from his norm and is wondering if it may be related to his heart or his lungs.  Every once in a while, he experiences a mild left chest pain. However, he is able to apply pressure to the area and resolve his pain. Otherwise, no orthopnea, LE edema, palpitations, or PND. Has occasional dizziness but no syncope. Tolerating medications as prescribed.   Past Medical History:  Diagnosis Date   Arthritis    Asthma, chronic 06/02/2013   Carpal tunnel syndrome 04/17/2015   Bilateral   COPD (chronic obstructive pulmonary disease) ([place])    Depression 06/02/2013   DYSPNEA ON EXERTION 06/04/2009   Qualifier: Diagnosis of  By: Domenic Polite, MD, [place]    Hereditary and idiopathic peripheral neuropathy 04/17/2015   Hyperlipemia 06/02/2013    Hypertension    LEG PAIN, BILATERAL 06/04/2009   Qualifier: Diagnosis of  By: Domenic Polite, MD, FACC, [place] CORONARY ANGIOPLASTY, HX OF 06/04/2009   Qualifier: Diagnosis of  By: Domenic Polite, MD, [place]    Prostate CA [facility])    and basal cell nose    Past Surgical History:  Procedure Laterality Date   CARDIAC CATHETERIZATION  1997   EXCISION MASS NECK Left 11/12/2020   Procedure: EXCISION LEFT POSTERIOR NECK MASS;  Surgeon: Coralie Keens, MD;  Location: [place];  Service: General;  Laterality: Left;   EXCISION METACARPAL MASS Left 02/04/2022   Procedure: LEFT THUMB EXCISION MASS;  Surgeon: Sherilyn Cooter, MD;  Location: [place];  Service: Orthopedics;  Laterality: Left;   EYE SURGERY Bilateral 2020   cataracts removed   HERNIA REPAIR     HIATAL HERNIA REPAIR     LAPAROSCOPIC GASTRIC BANDING  7858   [facility] fundoplication   MENISCUS REPAIR Left 2012   ROBOT ASSISTED LAPAROSCOPIC RADICAL PROSTATECTOMY     ROTATOR CUFF REPAIR Right 2019   TOTAL KNEE ARTHROPLASTY Right 01/03/2021   Procedure: RIGHT TOTAL KNEE ARTHROPLASTY;  Surgeon: Mcarthur Rossetti, MD;  [facility];  Service: Orthopedics;  Laterality: Right;  Needs RNFA   TOTAL KNEE ARTHROPLASTY Left 04/25/2021   Procedure: LEFT TOTAL KNEE ARTHROPLASTY;  Surgeon: Mcarthur Rossetti, MD;  [facility];  Service:  Orthopedics;  Laterality: Left;    Current Medications: Current Meds  Medication Sig   amLODipine (NORVASC) 5 MG tablet Take 1 tablet (5 mg total) by mouth daily.   aspirin 81 MG chewable tablet Chew 1 tablet (81 mg total) by mouth 2 (two) times daily.   atenolol (TENORMIN) 25 MG tablet Take 1 tablet (25 mg total) by mouth daily.   budesonide-formoterol (SYMBICORT) 160-4.5 MCG/ACT inhaler Inhale 2 puffs into the lungs 2 (two) times daily.   Cholecalciferol (VITAMIN D3) 25 MCG (1000 UT) CAPS Take 1,000 Units by mouth daily.    citalopram (CELEXA) 20 MG tablet Take 1 tablet (20 mg total) by mouth daily.   cyanocobalamin (VITAMIN B12) 1000 MCG/ML injection Inject 1 mL (1,000 mcg total) into the muscle every 30 (thirty) days.   ezetimibe (ZETIA) 10 MG tablet Take 10 mg by mouth daily.   loratadine (CLARITIN) 10 MG tablet Take 10 mg by mouth daily as needed for allergies.   montelukast (SINGULAIR) 10 MG tablet Take 1 tablet (10 mg total) by mouth daily.   pravastatin (PRAVACHOL) 20 MG tablet Take 0.5 tablets (10 mg total) by mouth daily.   sildenafil (REVATIO) 20 MG tablet Take 20 mg by mouth as needed.     Allergies:   Fenofibrate and Rosuvastatin   Social History   Socioeconomic History   Marital status: Married    Spouse name: [name]   Number of children: 3   Years of education: 18   Highest education level: Master's degree (e.g., MA, MS, MEng, MEd, MSW, MBA)  Occupational History   Occupation: Retired    Comment: Nature conservation officer  Tobacco Use   Smoking status: Former    Packs/day: 1.00    Years: 20.00    Total pack years: 20.00    Types: Cigarettes    Quit date: 01/18/1993    Years since quitting: 29.6   Smokeless tobacco: Never  Vaping Use   Vaping Use: Never used  Substance and Sexual Activity   Alcohol use: Yes    Alcohol/week: 2.0 standard drinks of alcohol    Types: 2 Cans of beer per week    Comment: occasional   Drug use: No   Sexual activity: Yes  Other Topics Concern   Not on file  Social History Narrative   Patient is married and lives at home with is wife.    He has three adult children and five grandchildren.    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    Social Determinants of Health   Financial Resource Strain: Low Risk  (10/27/2021)   Overall Financial Resource Strain (CARDIA)    Difficulty of Paying Living Expenses: Not hard at all  Food Insecurity: No Food Insecurity (10/27/2021)   Hunger Vital Sign    Worried About Running Out of Food in the Last  Year: Never true    Ran Out of Food in the Last Year: Never true  Transportation Needs: No Transportation Needs (10/27/2021)   PRAPARE - Hydrologist (Medical): No    Lack of Transportation (Non-Medical): No  Physical Activity: Sufficiently Active (10/27/2021)   Exercise Vital Sign    Days of Exercise per Week: 7 days    Minutes of Exercise per Session: 30 min  Stress: No Stress Concern Present (10/27/2021)   [place]    Feeling of Stress : Not at all  Social Connections: Moderately  Integrated (10/27/2021)   Social Connection and Isolation Panel [NHANES]    Frequency of Communication with Friends and Family: Not on file    Frequency of Social Gatherings with Friends and Family: More than three times a week    Attends Religious Services: Never    Marine scientist or Organizations: Yes    Attends Music therapist: More than 4 times per year    Marital Status: Married     Family History: The patient's family history includes Cancer (age of onset: 65) in his father; Dementia (age of onset: 78) in his mother; Esophageal cancer in his brother; Heart disease in an other family member; Heart failure in his mother.  ROS:   Please see the history of present illness.    Review of Systems  Constitutional:  Positive for malaise/fatigue. Negative for chills and fever.  HENT:  Negative for hearing loss and sore throat.   Eyes:  Negative for blurred vision and redness.  Respiratory:  Positive for shortness of breath.   Cardiovascular:  Positive for chest pain (Left). Negative for palpitations, orthopnea, claudication, leg swelling and PND.  Gastrointestinal:  Negative for melena, nausea and vomiting.  Genitourinary:  Positive for frequency. Negative for dysuria and flank pain.  Musculoskeletal:  Positive for joint pain and myalgias.  Neurological:  Positive for dizziness. Negative for  loss of consciousness.  Endo/Heme/Allergies:  Negative for polydipsia. Bruises/bleeds easily.  Psychiatric/Behavioral:  Negative for substance abuse.     EKGs/Labs/Other Studies Reviewed:    The following studies were reviewed today:  Myoview Lexiscan 11/25/20 The left ventricular ejection fraction is mildly decreased (45-54%). Nuclear stress EF: 50%. Mild global hypokinesis. There was no ST segment deviation noted during stress. This is a low risk study. There is no evidence of old infarct or ischemia pattern. Apical thinning noted, normal variant. Chamber size mildly enlarged.  TTE 2014: Study Conclusions   - Left ventricle: The cavity size was normal. Wall thickness    was increased in a pattern of moderate LVH. Systolic    function was normal. The estimated ejection fraction was    in the range of 50% to 55%. Wall motion was normal; there    were no regional wall motion abnormalities. Doppler    parameters are consistent with abnormal left ventricular    relaxation (grade 1 diastolic dysfunction).  - Left atrium: The atrium was mildly dilated.    Myoview 2014: Negative for ischemia, normal LVEF  EKG: EKG is personally reviewed.  08/20/2022: Sinus bradycardia. IVCD. Rate 58 bpm. 08/05/21: NSR with LAFB, IVCD 11/19/20: NSR with LAFB, IVCD.  Recent Labs: 07/31/2022: ALT 18; BUN 17; Creatinine, Ser 1.42; Hemoglobin 14.6; Platelets 219; Potassium 4.7; Sodium 133   Recent Lipid Panel    Component Value Date/Time   CHOL 135 06/04/2022 1424   TRIG 182 (H) 06/04/2022 1424   TRIG 244 (H) 10/24/2014 1201   HDL 46 06/04/2022 1424   HDL 43 10/24/2014 1201   CHOLHDL 2.9 06/04/2022 1424   CHOLHDL 7.0 (H) 08/31/2016 0800   VLDL 65 (H) 08/31/2016 0800   LDLCALC 59 06/04/2022 1424   LDLCALC 81 01/11/2014 0840   LDLDIRECT 92.8 08/28/2014 1041      Physical Exam:    VS:  BP 134/70   Pulse (!) 58   Ht '6\' 2"'$  (1.88 m)   Wt 250 lb (113.4 kg)   SpO2 96%   BMI 32.10 kg/m      Wt Readings from Last 3  Encounters:  08/20/22 250 lb (113.4 kg)  07/31/22 250 lb (113.4 kg)  06/04/22 252 lb (114.3 kg)     GEN:  Well nourished, well developed in no acute distress HEENT: Normal NECK: No JVD; No carotid bruits CARDIAC: RRR, no murmurs, rubs, gallops RESPIRATORY:  Clear to auscultation without rales, wheezing or rhonchi  ABDOMEN: Soft, non-tender, non-distended MUSCULOSKELETAL:  No edema; No deformity  SKIN: Warm and dry, no edema NEUROLOGIC:  Alert and oriented x 3 PSYCHIATRIC:  Normal affect   ASSESSMENT:    1. SOB (shortness of breath)   2. Essential hypertension   3. Statin intolerance   4. DOE (dyspnea on exertion)   5. Palpitations   6. Mixed hyperlipidemia      PLAN:    In order of problems listed above:  #DOE: Has been ongoing for the past several months. Myoview 11/2020 with no ischemia or infarction. Last TTE 07/2013 with LVEF 50-55%, G1DD, no significant valve disease. Will check TTE to re-evaluate. Can consider NM PET to better assess for ischemia. Will also refer to Pulm given history of COPD. -Check TTE -If TTE concerning, can pursue NM PET; had reassuring myoview in 11/2020. Avoiding CTA due to CKD  #Palpitations : Resolved with no current symptoms. Cardiac monitor with 0% Afib. TTE with mild LA enlargement. On ASA. -Continue to monitor -Continue ASA '81mg'$  daily -Continue atenolol '25mg'$  daily  #HTN:  Well controlled and at goal <120s/80s. -Continue amlodipine '5mg'$  -Continue atenolol '25mg'$    #HLD: Intolerant to simva, atorva and crestor. LDL 59 on 05/2022. -Continue pravastatin '10mg'$  -Continue zetia '10mg'$  daily -Goal LDL <100  Follow-up:  6 months.  Medication Adjustments/Labs and Tests Ordered: Current medicines are reviewed at length with the patient today.  Concerns regarding medicines are outlined above.   Orders Placed This Encounter  Procedures   Ambulatory referral to Pulmonology   EKG 12-Lead   ECHOCARDIOGRAM COMPLETE    No orders of the defined types were placed in this encounter.  Patient Instructions  Medication Instructions:   Your physician recommends that you continue on your current medications as directed. Please refer to the Current Medication list given to you today.  *If you need a refill on your cardiac medications before your next appointment, please call your pharmacy*   You have been referred to [place] AND NEGATIVE STRESS TEST    Testing/Procedures:  Your physician has requested that you have an echocardiogram. Echocardiography is a painless test that uses sound waves to create images of your heart. It provides your doctor with information about the size and shape of your heart and how well your heart's chambers and valves are working. This procedure takes approximately one hour. There are no restrictions for this procedure. Please do NOT wear cologne, perfume, aftershave, or lotions (deodorant is allowed). Please arrive 15 minutes prior to your appointment time.    Follow-Up: At [facility], you and your health needs are our priority.  As part of our continuing mission to provide you with exceptional heart care, we have created designated Provider Care Teams.  These Care Teams include your primary Cardiologist (physician) and Advanced Practice Providers (APPs -  Physician Assistants and Nurse Practitioners) who all work together to provide you with the care you need, when you need it.  We recommend signing up for the patient portal called "MyChart".  Sign up information is provided on this After Visit Summary.  MyChart is used to connect with patients  for Virtual Visits (Telemedicine).  Patients are able to view lab/test results, encounter notes, upcoming appointments, etc.  Non-urgent messages can be sent to your provider as well.   To learn more about what you can do with MyChart, go to [email or web address].    Your next  appointment:   6 month(s)  The format for your next appointment:   In Person  Provider:   DR. Johney Frame   Important Information About Sugar        I,Mathew Stumpf,acting as a scribe for Freada Bergeron, MD.,have documented all relevant documentation on the behalf of Freada Bergeron, MD,as directed by  Freada Bergeron, MD while in the presence of Freada Bergeron, MD.   I, Freada Bergeron, MD, have reviewed all documentation for this visit. The documentation on 08/20/22 for the exam, diagnosis, procedures, and orders are all accurate and complete.   Signed, Freada Bergeron, MD  08/20/2022 9:53 AM    [place]

## 2022-08-20 NOTE — Patient Instructions (Signed)
Medication Instructions:   Your physician recommends that you continue on your current medications as directed. Please refer to the Current Medication list given to you today.  *If you need a refill on your cardiac medications before your next appointment, please call your pharmacy*   You have been referred to [place] AND NEGATIVE STRESS TEST    Testing/Procedures:  Your physician has requested that you have an echocardiogram. Echocardiography is a painless test that uses sound waves to create images of your heart. It provides your doctor with information about the size and shape of your heart and how well your heart's chambers and valves are working. This procedure takes approximately one hour. There are no restrictions for this procedure. Please do NOT wear cologne, perfume, aftershave, or lotions (deodorant is allowed). Please arrive 15 minutes prior to your appointment time.    Follow-Up: At [facility], you and your health needs are our priority.  As part of our continuing mission to provide you with exceptional heart care, we have created designated Provider Care Teams.  These Care Teams include your primary Cardiologist (physician) and Advanced Practice Providers (APPs -  Physician Assistants and Nurse Practitioners) who all work together to provide you with the care you need, when you need it.  We recommend signing up for the patient portal called "MyChart".  Sign up information is provided on this After Visit Summary.  MyChart is used to connect with patients for Virtual Visits (Telemedicine).  Patients are able to view lab/test results, encounter notes, upcoming appointments, etc.  Non-urgent messages can be sent to your provider as well.   To learn more about what you can do with MyChart, go to [email or web address].    Your next appointment:   6 month(s)  The format for your next appointment:   In Person  Provider:    DR. Johney Frame   Important Information About Sugar

## 2022-08-22 ENCOUNTER — Other Ambulatory Visit: Payer: Self-pay | Admitting: Nurse Practitioner

## 2022-08-26 DIAGNOSIS — Z8546 Personal history of malignant neoplasm of prostate: Secondary | ICD-10-CM | POA: Diagnosis not present

## 2022-08-31 ENCOUNTER — Ambulatory Visit (INDEPENDENT_AMBULATORY_CARE_PROVIDER_SITE_OTHER): Payer: Medicare Other | Admitting: Pulmonary Disease

## 2022-08-31 ENCOUNTER — Encounter (HOSPITAL_BASED_OUTPATIENT_CLINIC_OR_DEPARTMENT_OTHER): Payer: Self-pay | Admitting: Pulmonary Disease

## 2022-08-31 VITALS — BP 124/60 | HR 60 | Ht 74.0 in | Wt 248.0 lb

## 2022-08-31 DIAGNOSIS — J449 Chronic obstructive pulmonary disease, unspecified: Secondary | ICD-10-CM | POA: Diagnosis not present

## 2022-08-31 DIAGNOSIS — Z87891 Personal history of nicotine dependence: Secondary | ICD-10-CM

## 2022-08-31 NOTE — Patient Instructions (Signed)
Presumed COPD History tobacco abuse --ORDER pulmonary function tests. Hold on inhalers until testing complete  Follow-up with me in PFTs prior to visit. Prefers this week if available

## 2022-08-31 NOTE — Progress Notes (Signed)
Subjective:   PATIENT ID: [name] [name] GENDER: male DOB: March 23, 1946, MRN: [id number]  Chief Complaint  Patient presents with   Consult    [name] [name], states when walking his dog he gets short winded    Reason for Visit: New consult for shortness of breath  [name] is a 76 year old male former smoker with COPD, HTN, HLD, hx prostate cancer in remission, basal cell carcinoma s/p excision and palpitations who presents as new consult for shortness of breath.  He was recently seen in Cardiology clinic in December. Note from Dr. Johney Frame on 08/20/22 reviewed. He reported fatigue and shortness of breath at the time. Walking 2000-3000. Planning for repeat echo and cardiac work-up. Referred to Pulmonary to rule out lung related dyspnea  He reports in his 62s and reported dyspnea on exertion with running and retired from [employer] at that time. He gets fatigued quickly and has to take deep breaths. Able to walk short distances but inclines are difficult. Reports associated coughing and wheezing sometimes. Cough is usually productive. Only uses symbicort as needed.   Social History: 1 ppd x 25 years. 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 Spent time overseas in [place] and [place]  Past Medical History:  Diagnosis Date   Arthritis    Asthma, chronic 06/02/2013   Carpal tunnel syndrome 04/17/2015   Bilateral   COPD (chronic obstructive pulmonary disease) ([place])    Depression 06/02/2013   DYSPNEA ON EXERTION 06/04/2009   Qualifier: Diagnosis of  By: Domenic Polite, MD, [place] Hay    Hereditary and idiopathic peripheral neuropathy 04/17/2015   Hyperlipemia 06/02/2013   Hypertension    LEG PAIN, BILATERAL 06/04/2009   Qualifier: Diagnosis of  By: Domenic Polite, MD, [place], HX OF 06/04/2009   Qualifier: Diagnosis of  By: Domenic Polite, MD, Phillips Hay    Prostate CA [facility])    and basal cell nose      Family History  Problem Relation Age of Onset   Heart disease Other        Early Cardiac disease for uncle is their 63's as well as deceased.   Heart failure Mother    Dementia Mother 46   Cancer Father 63       testicular, lung   Esophageal cancer Brother      Social History   Occupational History   Occupation: Retired    Comment: Nature conservation officer  Tobacco Use   Smoking status: Former    Packs/day: 1.00    Years: 20.00    Total pack years: 20.00    Types: Cigarettes    Quit date: 01/18/1993    Years since quitting: 29.6   Smokeless tobacco: Never  Vaping Use   Vaping Use: Never used  Substance and Sexual Activity   Alcohol use: Yes    Alcohol/week: 2.0 standard drinks of alcohol    Types: 2 Cans of beer per week    Comment: occasional   Drug use: No   Sexual activity: Yes    Allergies  Allergen Reactions   Fenofibrate Other (See Comments)    Pt reports causes joint pains   Rosuvastatin Other (See Comments)    Pt reports causes joint pains     Outpatient Medications Prior to Visit  Medication Sig Dispense Refill   amLODipine (NORVASC) 5 MG tablet Take 1 tablet (5 mg total) by mouth daily. 90 tablet 1   aspirin 81 MG  chewable tablet Chew 1 tablet (81 mg total) by mouth 2 (two) times daily. 30 tablet 0   atenolol (TENORMIN) 25 MG tablet Take 1 tablet (25 mg total) by mouth daily. 90 tablet 1   budesonide-formoterol (SYMBICORT) 160-4.5 MCG/ACT inhaler Inhale 2 puffs into the lungs 2 (two) times daily. 3 each 3   Cholecalciferol (VITAMIN D3) 25 MCG (1000 UT) CAPS Take 1,000 Units by mouth daily.     citalopram (CELEXA) 20 MG tablet Take 1 tablet (20 mg total) by mouth daily. 90 tablet 1   cyanocobalamin (VITAMIN B12) 1000 MCG/ML injection Inject 1 mL (1,000 mcg total) into the muscle every 30 (thirty) days. 3 mL 1   ezetimibe (ZETIA) 10 MG tablet TAKE ONE TABLET ONCE DAILY 90 tablet 0   loratadine (CLARITIN) 10 MG tablet Take 10 mg by mouth daily as needed for  allergies.     montelukast (SINGULAIR) 10 MG tablet Take 1 tablet (10 mg total) by mouth daily. 90 tablet 1   pravastatin (PRAVACHOL) 20 MG tablet Take 0.5 tablets (10 mg total) by mouth daily. 90 tablet 1   sildenafil (REVATIO) 20 MG tablet Take 20 mg by mouth as needed.     No facility-administered medications prior to visit.    Review of Systems  Constitutional:  Negative for chills, diaphoresis, fever, malaise/fatigue and weight loss.  HENT:  Positive for congestion.   Respiratory:  Positive for cough, shortness of breath and wheezing. Negative for hemoptysis and sputum production.   Cardiovascular:  Negative for chest pain, palpitations and leg swelling.  Musculoskeletal:  Positive for joint pain.     Objective:   Vitals:   08/31/22 0928  BP: 124/60  Pulse: 60  SpO2: 97%  Weight: 248 lb (112.5 kg)  Height: '6\' 2"'$  (1.88 m)   SpO2: 97 % O2 Device: None (Room air)  Physical Exam: General: Well-appearing, no acute distress HENT: [place], AT Eyes: EOMI, no scleral icterus Respiratory: Diminished breath sounds to auscultation bilaterally.  No crackles, wheezing or rales Cardiovascular: RRR, -M/R/G, no JVD Extremities:-Edema,-tenderness Neuro: AAO x4, CNII-XII grossly intact Psych: Normal mood, normal affect  Data Reviewed:  Imaging: No chest imaging on file  PFT: None on file  Labs: CBC    Component Value Date/Time   WBC 8.5 07/31/2022 1012   WBC 16.2 (H) 04/26/2021 0313   RBC 4.66 07/31/2022 1012   RBC 3.73 (L) 04/26/2021 0313   HGB 14.6 07/31/2022 1012   HCT 43.3 07/31/2022 1012   PLT 219 07/31/2022 1012   MCV 93 07/31/2022 1012   MCH 31.3 07/31/2022 1012   MCH 30.8 04/26/2021 0313   MCHC 33.7 07/31/2022 1012   MCHC 32.5 04/26/2021 0313   RDW 12.2 07/31/2022 1012   LYMPHSABS 3.7 (H) 07/31/2022 1012   MONOABS 0.5 10/08/2014 1811   EOSABS 0.2 07/31/2022 1012   BASOSABS 0.1 07/31/2022 1012   Absolute eos 07/31/22 - 200     Assessment & Plan:    Discussion: 76 year old male former smoker with COPD, HTN, HLD, hx prostate cancer in remission, basal cell carcinoma s/p excision and palpitations who presents as new consult for shortness of breath.  Reluctant to use inhalers unless he needs it. We discussed further evaluation with PFTs and counseled on the benefit of bronchodilators. He will consider medications pending results at that point.   Presumed COPD History tobacco abuse --ORDER pulmonary function tests. Hold on inhalers until testing complete  Health Maintenance Immunization History  Administered Date(s) Administered  COVID-19, mRNA, vaccine(Comirnaty)12 years and older 07/14/2022   Fluad Quad(high Dose 65+) 06/21/2019, 07/02/2020, 06/18/2021, 06/04/2022   Influenza, High Dose Seasonal PF 07/07/2016, 06/15/2017, 07/14/2018   Influenza,inj,Quad PF,6+ Mos 06/27/2013, 06/25/2014, 06/28/2015   Moderna Covid-19 Vaccine Bivalent Booster 48yr & up 07/15/2021   Moderna SARS-COV2 Booster Vaccination 07/11/2020, 01/12/2021   Moderna Sars-Covid-2 Vaccination 10/20/2019, 11/18/2019   Pneumococcal Conjugate-13 06/28/2015   Pneumococcal Polysaccharide-23 10/27/2011   Respiratory Syncytial Virus Vaccine,Recomb Aduvanted(Arexvy) 06/25/2022   Tdap 07/07/2016   Zoster Recombinat (Shingrix) 12/09/2017, 02/16/2018   CT Lung Screen - not qualified. Quit smoking > 15 years  Orders Placed This Encounter  Procedures   Pulmonary function test    Standing Status:   Future    Standing Expiration Date:   09/01/2023    Order Specific Question:   Where should this test be performed?    Answer:   [facility]    Order Specific Question:   Full PFT: includes the following: basic spirometry, spirometry pre & post bronchodilator, diffusion capacity (DLCO), lung volumes    Answer:   Full PFT  No orders of the defined types were placed in this encounter.   No follow-ups on file. After PFTs  I have spent a total time of 45-minutes on the  day of the appointment reviewing prior documentation, coordinating care and discussing medical diagnosis and plan with the patient/family. Imaging, labs and tests included in this note have been reviewed and interpreted independently by me.  CBuchanan MD [facility] 08/31/2022 10:33 AM  Office Number [phone]

## 2022-09-02 DIAGNOSIS — R351 Nocturia: Secondary | ICD-10-CM | POA: Diagnosis not present

## 2022-09-02 DIAGNOSIS — Z8546 Personal history of malignant neoplasm of prostate: Secondary | ICD-10-CM | POA: Diagnosis not present

## 2022-09-03 ENCOUNTER — Encounter (HOSPITAL_BASED_OUTPATIENT_CLINIC_OR_DEPARTMENT_OTHER): Payer: Self-pay | Admitting: Pulmonary Disease

## 2022-09-03 ENCOUNTER — Ambulatory Visit (INDEPENDENT_AMBULATORY_CARE_PROVIDER_SITE_OTHER): Payer: Medicare Other | Admitting: Pulmonary Disease

## 2022-09-03 VITALS — BP 126/60 | HR 73 | Ht 74.0 in | Wt 257.4 lb

## 2022-09-03 DIAGNOSIS — J449 Chronic obstructive pulmonary disease, unspecified: Secondary | ICD-10-CM | POA: Diagnosis not present

## 2022-09-03 DIAGNOSIS — J4489 Other specified chronic obstructive pulmonary disease: Secondary | ICD-10-CM

## 2022-09-03 LAB — PULMONARY FUNCTION TEST
DL/VA % pred: 124 %
DL/VA: 4.85 ml/min/mmHg/L
DLCO cor % pred: 107 %
DLCO cor: 30.31 ml/min/mmHg
DLCO unc % pred: 107 %
DLCO unc: 30.31 ml/min/mmHg
FEF 25-75 Post: 1.78 L/sec
FEF 25-75 Pre: 1.26 L/sec
FEF2575-%Change-Post: 41 %
FEF2575-%Pred-Post: 69 %
FEF2575-%Pred-Pre: 49 %
FEV1-%Change-Post: 14 %
FEV1-%Pred-Post: 70 %
FEV1-%Pred-Pre: 61 %
FEV1-Post: 2.51 L
FEV1-Pre: 2.2 L
FEV1FVC-%Change-Post: 4 %
FEV1FVC-%Pred-Pre: 87 %
FEV6-%Change-Post: 10 %
FEV6-%Pred-Post: 80 %
FEV6-%Pred-Pre: 72 %
FEV6-Post: 3.7 L
FEV6-Pre: 3.34 L
FEV6FVC-%Change-Post: 0 %
FEV6FVC-%Pred-Post: 103 %
FEV6FVC-%Pred-Pre: 103 %
FVC-%Change-Post: 9 %
FVC-%Pred-Post: 77 %
FVC-%Pred-Pre: 70 %
FVC-Post: 3.77 L
FVC-Pre: 3.46 L
Post FEV1/FVC ratio: 67 %
Post FEV6/FVC ratio: 98 %
Pre FEV1/FVC ratio: 64 %
Pre FEV6/FVC Ratio: 98 %
RV % pred: 157 %
RV: 4.4 L
TLC % pred: 102 %
TLC: 8.07 L

## 2022-09-03 MED ORDER — FLUTICASONE-SALMETEROL 230-21 MCG/ACT IN AERO: 2.0000 | INHALATION_SPRAY | Freq: Two times a day (BID) | RESPIRATORY_TRACT | 5 refills | Status: DC

## 2022-09-03 NOTE — Progress Notes (Signed)
Subjective:   PATIENT ID: [name] [name] GENDER: male DOB: 10/29/1945, MRN: [id number]  Chief Complaint  Patient presents with   Follow-up    Pft results    Reason for Visit: Shortness of breath  [name] is a 76 year old male former smoker with COPD, HTN, HLD, hx prostate cancer in remission, basal cell carcinoma s/p excision and palpitations who presents for shortness of breath.  Initial consult 08/2022 He was recently seen in Cardiology clinic in December. Note from Dr. Johney [name] on 08/20/22 reviewed. He reported fatigue and shortness of breath at the time. Walking 2000-3000 stpes. Planning for repeat echo and cardiac work-up. Referred to Pulmonary to rule out lung related dyspnea  He reports in his 9s and reported dyspnea on exertion with running and retired from [employer] at that time. He gets fatigued quickly and has to take deep breaths. Able to walk short distances but inclines are difficult. Reports associated coughing and wheezing sometimes. Cough is usually productive. Only uses symbicort as needed.   09/03/22 Since our last visit, his symptoms since initial consult are unchanged. Has shortness of breath associated with productive cough and wheezing worsened with exertion. Completed PFTs and ready to discuss results.  Social History: 1 ppd x 25 years. 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 Spent time overseas in [place] and [place]  Past Medical History:  Diagnosis Date   Arthritis    Asthma, chronic 06/02/2013   Carpal tunnel syndrome 04/17/2015   Bilateral   COPD (chronic obstructive pulmonary disease) ([place])    Depression 06/02/2013   DYSPNEA ON EXERTION 06/04/2009   Qualifier: Diagnosis of  By: Domenic Polite, MD, [place] Hay    Hereditary and idiopathic peripheral neuropathy 04/17/2015   Hyperlipemia 06/02/2013   Hypertension    LEG PAIN, BILATERAL 06/04/2009   Qualifier: Diagnosis of  By: Domenic Polite, MD, [place], HX OF 06/04/2009   Qualifier: Diagnosis of  By: Domenic Polite, MD, [place] Hay    Prostate CA [facility])    and basal cell nose     Family History  Problem Relation Age of Onset   Heart disease Other        Early Cardiac disease for uncle is their 77's as well as deceased.   Heart failure Mother    Dementia Mother 2   Cancer Father 40       testicular, lung   Esophageal cancer Brother      Social History   Occupational History   Occupation: Retired    Comment: Nature conservation officer  Tobacco Use   Smoking status: Former    Packs/day: 1.00    Years: 20.00    Total pack years: 20.00    Types: Cigarettes    Quit date: 01/18/1993    Years since quitting: 29.6   Smokeless tobacco: Never  Vaping Use   Vaping Use: Never used  Substance and Sexual Activity   Alcohol use: Yes    Alcohol/week: 2.0 standard drinks of alcohol    Types: 2 Cans of beer per week    Comment: occasional   Drug use: No   Sexual activity: Yes    Allergies  Allergen Reactions   Fenofibrate Other (See Comments)    Pt reports causes joint pains   Rosuvastatin Other (See Comments)    Pt reports causes joint pains     Outpatient Medications Prior to Visit  Medication Sig Dispense Refill  amLODipine (NORVASC) 5 MG tablet Take 1 tablet (5 mg total) by mouth daily. 90 tablet 1   aspirin 81 MG chewable tablet Chew 1 tablet (81 mg total) by mouth 2 (two) times daily. 30 tablet 0   atenolol (TENORMIN) 25 MG tablet Take 1 tablet (25 mg total) by mouth daily. 90 tablet 1   Cholecalciferol (VITAMIN D3) 25 MCG (1000 UT) CAPS Take 1,000 Units by mouth daily.     citalopram (CELEXA) 20 MG tablet Take 1 tablet (20 mg total) by mouth daily. 90 tablet 1   cyanocobalamin (VITAMIN B12) 1000 MCG/ML injection Inject 1 mL (1,000 mcg total) into the muscle every 30 (thirty) days. 3 mL 1   ezetimibe (ZETIA) 10 MG tablet TAKE ONE TABLET ONCE DAILY 90 tablet 0   loratadine (CLARITIN)  10 MG tablet Take 10 mg by mouth daily as needed for allergies.     montelukast (SINGULAIR) 10 MG tablet Take 1 tablet (10 mg total) by mouth daily. 90 tablet 1   pravastatin (PRAVACHOL) 20 MG tablet Take 0.5 tablets (10 mg total) by mouth daily. 90 tablet 1   sildenafil (REVATIO) 20 MG tablet Take 20 mg by mouth as needed.     budesonide-formoterol (SYMBICORT) 160-4.5 MCG/ACT inhaler Inhale 2 puffs into the lungs 2 (two) times daily. 3 each 3   No facility-administered medications prior to visit.    Review of Systems  Constitutional:  Negative for chills, diaphoresis, fever, malaise/fatigue and weight loss.  HENT:  Positive for congestion.   Respiratory:  Positive for cough, shortness of breath and wheezing. Negative for hemoptysis and sputum production.   Cardiovascular:  Negative for chest pain, palpitations and leg swelling.     Objective:   Vitals:   09/03/22 1436  BP: 126/60  Pulse: 73  SpO2: 98%  Weight: 257 lb 6.4 oz (116.8 kg)  Height: '6\' 2"'$  (1.88 m)   SpO2: 98 % O2 Device: None (Room air)  Physical Exam: General: Well-appearing, no acute distress HENT: [place], AT Eyes: EOMI, no scleral icterus Respiratory: Diminished but clear to auscultation bilaterally.  No crackles, wheezing or rales Cardiovascular: RRR, -M/R/G, no JVD Extremities:-Edema,-tenderness Neuro: AAO x4, CNII-XII grossly intact Psych: Normal mood, normal affect  Data Reviewed:  Imaging: No chest imaging on file  PFT: 09/03/22 FVC 3.77 (77%) FEV1 2.51 (70%) Ratio 64  TLC 102% RV 157% RV/TLC 145% DLCO 107% Interpretation: Based on FEV1/SVC, mild obstructive defect with significant bronchodilator response. Air trapping present.  Labs: CBC    Component Value Date/Time   WBC 8.5 07/31/2022 1012   WBC 16.2 (H) 04/26/2021 0313   RBC 4.66 07/31/2022 1012   RBC 3.73 (L) 04/26/2021 0313   HGB 14.6 07/31/2022 1012   HCT 43.3 07/31/2022 1012   PLT 219 07/31/2022 1012   MCV 93 07/31/2022 1012   MCH  31.3 07/31/2022 1012   MCH 30.8 04/26/2021 0313   MCHC 33.7 07/31/2022 1012   MCHC 32.5 04/26/2021 0313   RDW 12.2 07/31/2022 1012   LYMPHSABS 3.7 (H) 07/31/2022 1012   MONOABS 0.5 10/08/2014 1811   EOSABS 0.2 07/31/2022 1012   BASOSABS 0.1 07/31/2022 1012   Absolute eos 07/31/22 - 200     Assessment & Plan:   Discussion: 76 year old male former smoker with COPD-asthma overlap, HTN, HLD, hx prostate cancer in remission, basal cell carcinoma s/p excision and palpitations who presents for follow-up. PFTs reviewed and consistent with COPD-asthma overlap. Discussed clinical course and management of COPD/asthma including bronchodilator  regimen and action plan for exacerbation.  COPD-asthma overlap History tobacco abuse --START Advair 230-21 mcg TWO puffs in morning and evening --CONTINUE Albuterol AS NEEDED for shortness of breath, chest tightness or wheezing --CONTINUE singulair 10 mg daily  Health Maintenance Immunization History  Administered Date(s) Administered   COVID-19, mRNA, vaccine(Comirnaty)12 years and older 07/14/2022   Fluad Quad(high Dose 65+) 06/21/2019, 07/02/2020, 06/18/2021, 06/04/2022   Influenza, High Dose Seasonal PF 07/07/2016, 06/15/2017, 07/14/2018   Influenza,inj,Quad PF,6+ Mos 06/27/2013, 06/25/2014, 06/28/2015   Moderna Covid-19 Vaccine Bivalent Booster 56yr & up 07/15/2021   Moderna SARS-COV2 Booster Vaccination 07/11/2020, 01/12/2021   Moderna Sars-Covid-2 Vaccination 10/20/2019, 11/18/2019   Pneumococcal Conjugate-13 06/28/2015   Pneumococcal Polysaccharide-23 10/27/2011   Respiratory Syncytial Virus Vaccine,Recomb Aduvanted(Arexvy) 06/25/2022   Tdap 07/07/2016   Zoster Recombinat (Shingrix) 12/09/2017, 02/16/2018   CT Lung Screen - not qualified. Quit smoking > 15 years  No orders of the defined types were placed in this encounter.  Meds ordered this encounter  Medications   fluticasone-salmeterol (ADVAIR HFA) 230-21 MCG/ACT inhaler    Sig:  Inhale 2 puffs into the lungs 2 (two) times daily.    Dispense:  1 each    Refill:  5    Return in about 3 months (around 12/03/2022).   I have spent a total time of 32-minutes on the day of the appointment including chart review, data review, collecting history, coordinating care and discussing medical diagnosis and plan with the patient/family. Past medical history, allergies, medications were reviewed. Pertinent imaging, labs and tests included in this note have been reviewed and interpreted independently by me.  CRanger MD [facility] 09/03/2022 2:53 PM  Office Number [phone]

## 2022-09-03 NOTE — Progress Notes (Signed)
Full PFT Performed Today  

## 2022-09-03 NOTE — Patient Instructions (Signed)
COPD-asthma overlap History tobacco abuse --START Advair 230-21 mcg TWO puffs in morning and evening --CONTINUE Albuterol AS NEEDED for shortness of breath, chest tightness or wheezing --CONTINUE singulair 10 mg daily  Follow-up with me in March

## 2022-09-03 NOTE — Patient Instructions (Signed)
Full PFT Performed Today  

## 2022-09-05 ENCOUNTER — Encounter (HOSPITAL_BASED_OUTPATIENT_CLINIC_OR_DEPARTMENT_OTHER): Payer: Self-pay | Admitting: Pulmonary Disease

## 2022-09-18 ENCOUNTER — Ambulatory Visit (HOSPITAL_COMMUNITY): Payer: Medicare Other | Attending: Cardiology

## 2022-09-18 DIAGNOSIS — R0602 Shortness of breath: Secondary | ICD-10-CM

## 2022-09-18 LAB — ECHOCARDIOGRAM COMPLETE
Area-P 1/2: 3.3 cm2
S' Lateral: 2.9 cm

## 2022-10-06 DIAGNOSIS — L821 Other seborrheic keratosis: Secondary | ICD-10-CM | POA: Diagnosis not present

## 2022-10-06 DIAGNOSIS — B078 Other viral warts: Secondary | ICD-10-CM | POA: Diagnosis not present

## 2022-10-06 DIAGNOSIS — D2272 Melanocytic nevi of left lower limb, including hip: Secondary | ICD-10-CM | POA: Diagnosis not present

## 2022-10-06 DIAGNOSIS — L570 Actinic keratosis: Secondary | ICD-10-CM | POA: Diagnosis not present

## 2022-10-06 DIAGNOSIS — D485 Neoplasm of uncertain behavior of skin: Secondary | ICD-10-CM | POA: Diagnosis not present

## 2022-10-06 DIAGNOSIS — L814 Other melanin hyperpigmentation: Secondary | ICD-10-CM | POA: Diagnosis not present

## 2022-10-06 DIAGNOSIS — D235 Other benign neoplasm of skin of trunk: Secondary | ICD-10-CM | POA: Diagnosis not present

## 2022-10-06 DIAGNOSIS — B351 Tinea unguium: Secondary | ICD-10-CM | POA: Diagnosis not present

## 2022-10-06 DIAGNOSIS — D225 Melanocytic nevi of trunk: Secondary | ICD-10-CM | POA: Diagnosis not present

## 2022-10-06 DIAGNOSIS — Z85828 Personal history of other malignant neoplasm of skin: Secondary | ICD-10-CM | POA: Diagnosis not present

## 2022-10-06 DIAGNOSIS — L579 Skin changes due to chronic exposure to nonionizing radiation, unspecified: Secondary | ICD-10-CM | POA: Diagnosis not present

## 2022-10-28 ENCOUNTER — Ambulatory Visit (INDEPENDENT_AMBULATORY_CARE_PROVIDER_SITE_OTHER): Payer: Medicare Other

## 2022-10-28 VITALS — Ht 74.0 in | Wt 250.0 lb

## 2022-10-28 DIAGNOSIS — Z0000 Encounter for general adult medical examination without abnormal findings: Secondary | ICD-10-CM | POA: Diagnosis not present

## 2022-10-28 NOTE — Patient Instructions (Signed)
Mr. Slagowski , Thank you for taking time to come for your Medicare Wellness Visit. I appreciate your ongoing commitment to your health goals. Please review the following plan we discussed and let me know if I can assist you in the future.   These are the goals we discussed:  Goals      DIET - INCREASE WATER INTAKE     Try to drink 6-8 glasses of water daily     Exercise 3x per week (30 min per time)     Continue to walk 30 min daily        This is a list of the screening recommended for you and due dates:  Health Maintenance  Topic Date Due   Medicare Annual Wellness Visit  10/29/2023   DTaP/Tdap/Td vaccine (2 - Td or Tdap) 07/07/2026   Pneumonia Vaccine  Completed   Flu Shot  Completed   COVID-19 Vaccine  Completed   Hepatitis C Screening: USPSTF Recommendation to screen - Ages 30-79 yo.  Completed   Zoster (Shingles) Vaccine  Completed   HPV Vaccine  Aged Out   Colon Cancer Screening  Discontinued    Advanced directives: Forms are available if you choose in the future to pursue completion.  This is recommended in order to make sure that your health wishes are honored in the event that you are unable to verbalize them to the provider.    Conditions/risks identified: Aim for 30 minutes of exercise or brisk walking, 6-8 glasses of water, and 5 servings of fruits and vegetables each day.   Next appointment: Follow up in one year for your annual wellness visit.   Preventive Care 77 Years and Older, Male  Preventive care refers to lifestyle choices and visits with your health care provider that can promote health and wellness. What does preventive care include? A yearly physical exam. This is also called an annual well check. Dental exams once or twice a year. Routine eye exams. Ask your health care provider how often you should have your eyes checked. Personal lifestyle choices, including: Daily care of your teeth and gums. Regular physical activity. Eating a healthy  diet. Avoiding tobacco and drug use. Limiting alcohol use. Practicing safe sex. Taking low doses of aspirin every day. Taking vitamin and mineral supplements as recommended by your health care provider. What happens during an annual well check? The services and screenings done by your health care provider during your annual well check will depend on your age, overall health, lifestyle risk factors, and family history of disease. Counseling  Your health care provider may ask you questions about your: Alcohol use. Tobacco use. Drug use. Emotional well-being. Home and relationship well-being. Sexual activity. Eating habits. History of falls. Memory and ability to understand (cognition). Work and work Statistician. Screening  You may have the following tests or measurements: Height, weight, and BMI. Blood pressure. Lipid and cholesterol levels. These may be checked every 5 years, or more frequently if you are over 34 years old. Skin check. Lung cancer screening. You may have this screening every year starting at age 82 if you have a 30-pack-year history of smoking and currently smoke or have quit within the past 15 years. Fecal occult blood test (FOBT) of the stool. You may have this test every year starting at age 110. Flexible sigmoidoscopy or colonoscopy. You may have a sigmoidoscopy every 5 years or a colonoscopy every 10 years starting at age 16. Prostate cancer screening. Recommendations will vary depending on your  family history and other risks. Hepatitis C blood test. Hepatitis B blood test. Sexually transmitted disease (STD) testing. Diabetes screening. This is done by checking your blood sugar (glucose) after you have not eaten for a while (fasting). You may have this done every 1-3 years. Abdominal aortic aneurysm (AAA) screening. You may need this if you are a current or former smoker. Osteoporosis. You may be screened starting at age 50 if you are at high risk. Talk with  your health care provider about your test results, treatment options, and if necessary, the need for more tests. Vaccines  Your health care provider may recommend certain vaccines, such as: Influenza vaccine. This is recommended every year. Tetanus, diphtheria, and acellular pertussis (Tdap, Td) vaccine. You may need a Td booster every 10 years. Zoster vaccine. You may need this after age 41. Pneumococcal 13-valent conjugate (PCV13) vaccine. One dose is recommended after age 33. Pneumococcal polysaccharide (PPSV23) vaccine. One dose is recommended after age 22. Talk to your health care provider about which screenings and vaccines you need and how often you need them. This information is not intended to replace advice given to you by your health care provider. Make sure you discuss any questions you have with your health care provider. Document Released: 09/27/2015 Document Revised: 05/20/2016 Document Reviewed: 07/02/2015 Elsevier Interactive Patient Education  2017 [place] Prevention in the Home Falls can cause injuries. They can happen to people of all ages. There are many things you can do to make your home safe and to help prevent falls. What can I do on the outside of my home? Regularly fix the edges of walkways and driveways and fix any cracks. Remove anything that might make you trip as you walk through a door, such as a raised step or threshold. Trim any bushes or trees on the path to your home. Use bright outdoor lighting. Clear any walking paths of anything that might make someone trip, such as rocks or tools. Regularly check to see if handrails are loose or broken. Make sure that both sides of any steps have handrails. Any raised decks and porches should have guardrails on the edges. Have any leaves, snow, or ice cleared regularly. Use sand or salt on walking paths during winter. Clean up any spills in your garage right away. This includes oil or grease spills. What  can I do in the bathroom? Use night lights. Install grab bars by the toilet and in the tub and shower. Do not use towel bars as grab bars. Use non-skid mats or decals in the tub or shower. If you need to sit down in the shower, use a plastic, non-slip stool. Keep the floor dry. Clean up any water that spills on the floor as soon as it happens. Remove soap buildup in the tub or shower regularly. Attach bath mats securely with double-sided non-slip rug tape. Do not have throw rugs and other things on the floor that can make you trip. What can I do in the bedroom? Use night lights. Make sure that you have a light by your bed that is easy to reach. Do not use any sheets or blankets that are too big for your bed. They should not hang down onto the floor. Have a firm chair that has side arms. You can use this for support while you get dressed. Do not have throw rugs and other things on the floor that can make you trip. What can I do in the kitchen? Clean up  any spills right away. Avoid walking on wet floors. Keep items that you use a lot in easy-to-reach places. If you need to reach something above you, use a strong step stool that has a grab bar. Keep electrical cords out of the way. Do not use floor polish or wax that makes floors slippery. If you must use wax, use non-skid floor wax. Do not have throw rugs and other things on the floor that can make you trip. What can I do with my stairs? Do not leave any items on the stairs. Make sure that there are handrails on both sides of the stairs and use them. Fix handrails that are broken or loose. Make sure that handrails are as long as the stairways. Check any carpeting to make sure that it is firmly attached to the stairs. Fix any carpet that is loose or worn. Avoid having throw rugs at the top or bottom of the stairs. If you do have throw rugs, attach them to the floor with carpet tape. Make sure that you have a light switch at the top of the  stairs and the bottom of the stairs. If you do not have them, ask someone to add them for you. What else can I do to help prevent falls? Wear shoes that: Do not have high heels. Have rubber bottoms. Are comfortable and fit you well. Are closed at the toe. Do not wear sandals. If you use a stepladder: Make sure that it is fully opened. Do not climb a closed stepladder. Make sure that both sides of the stepladder are locked into place. Ask someone to hold it for you, if possible. Clearly mark and make sure that you can see: Any grab bars or handrails. First and last steps. Where the edge of each step is. Use tools that help you move around (mobility aids) if they are needed. These include: Canes. Walkers. Scooters. Crutches. Turn on the lights when you go into a dark area. Replace any light bulbs as soon as they burn out. Set up your furniture so you have a clear path. Avoid moving your furniture around. If any of your floors are uneven, fix them. If there are any pets around you, be aware of where they are. Review your medicines with your doctor. Some medicines can make you feel dizzy. This can increase your chance of falling. Ask your doctor what other things that you can do to help prevent falls. This information is not intended to replace advice given to you by your health care provider. Make sure you discuss any questions you have with your health care provider. Document Released: 06/27/2009 Document Revised: 02/06/2016 Document Reviewed: 10/05/2014 Elsevier Interactive Patient Education  2017 [employer].

## 2022-10-28 NOTE — Progress Notes (Signed)
Subjective:   [name] [name] is a 77 y.o. male who presents for Medicare Annual/Subsequent preventive examination.  I connected with  [name] on 10/28/22 by a audio enabled telemedicine application and verified that I am speaking with the correct person using two identifiers.  Patient Location: Home  Provider Location: Home Office  I discussed the limitations of evaluation and management by telemedicine. The patient expressed understanding and agreed to proceed.  Review of Systems     Cardiac Risk Factors include: advanced age (>32mn, >>33women);dyslipidemia;hypertension;male gender     Objective:    Today's Vitals   10/28/22 1514  Weight: 250 lb (113.4 kg)  Height: 6' 2"$  (1.88 m)   Body mass index is 32.1 kg/m.     10/28/2022    3:18 PM 02/04/2022    8:30 AM 01/28/2022   11:27 AM 12/09/2021    4:20 PM 10/27/2021    1:26 PM 04/25/2021   10:34 AM 04/18/2021    8:52 AM  Advanced Directives  Does Patient Have a Medical Advance Directive? No Yes Yes No Yes Yes No;Yes  Type of ACorporate treasurerof AOverland ParkLiving will HHoliday ValleyLiving will  HTakilmaLiving will HMatherLiving will HJamesvilleLiving will  Does patient want to make changes to medical advance directive?  No - Patient declined    No - Guardian declined   Copy of HSunrisein Chart?  No - copy requested   No - copy requested No - copy requested   Would patient like information on creating a medical advance directive? Yes (MAU/Ambulatory/Procedural Areas - Information given)          Current Medications (verified) Outpatient Encounter Medications as of 10/28/2022  Medication Sig   amLODipine (NORVASC) 5 MG tablet Take 1 tablet (5 mg total) by mouth daily.   aspirin 81 MG chewable tablet Chew 1 tablet (81 mg total) by mouth 2 (two) times daily.   atenolol (TENORMIN) 25 MG tablet Take 1 tablet (25  mg total) by mouth daily.   Cholecalciferol (VITAMIN D3) 25 MCG (1000 UT) CAPS Take 1,000 Units by mouth daily.   citalopram (CELEXA) 20 MG tablet Take 1 tablet (20 mg total) by mouth daily.   cyanocobalamin (VITAMIN B12) 1000 MCG/ML injection Inject 1 mL (1,000 mcg total) into the muscle every 30 (thirty) days.   ezetimibe (ZETIA) 10 MG tablet TAKE ONE TABLET ONCE DAILY   Fexofenadine-Pseudoephedrine (FEXOFENADINE-PSEUDOEPHED ER PO) Take 1 tablet by mouth daily.   fluticasone-salmeterol (ADVAIR HFA) 230-21 MCG/ACT inhaler Inhale 2 puffs into the lungs 2 (two) times daily.   montelukast (SINGULAIR) 10 MG tablet Take 1 tablet (10 mg total) by mouth daily.   pravastatin (PRAVACHOL) 20 MG tablet Take 0.5 tablets (10 mg total) by mouth daily.   sildenafil (REVATIO) 20 MG tablet Take 20 mg by mouth as needed.   [DISCONTINUED] loratadine (CLARITIN) 10 MG tablet Take 10 mg by mouth daily as needed for allergies.   No facility-administered encounter medications on file as of 10/28/2022.    Allergies (verified) Fenofibrate and Rosuvastatin   History: Past Medical History:  Diagnosis Date   Arthritis    Asthma, chronic 06/02/2013   Carpal tunnel syndrome 04/17/2015   Bilateral   COPD (chronic obstructive pulmonary disease) ([place]    Depression 06/02/2013   DYSPNEA ON EXERTION 06/04/2009   Qualifier: Diagnosis of  By: MDomenic Polite MD, FPhillips Hay  Hereditary and idiopathic peripheral neuropathy 04/17/2015   Hyperlipemia 06/02/2013   Hypertension    LEG PAIN, BILATERAL 06/04/2009   Qualifier: Diagnosis of  By: Domenic Polite, MD, FACC, [place], HX OF 06/04/2009   Qualifier: Diagnosis of  By: Domenic Polite, MD, Phillips Hay    Prostate CA [facility])    and basal cell nose   Past Surgical History:  Procedure Laterality Date   [place] Left 11/12/2020   Procedure: EXCISION LEFT POSTERIOR NECK  MASS;  Surgeon: Coralie Keens, MD;  Location: [place];  Service: General;  Laterality: Left;   EXCISION METACARPAL MASS Left 02/04/2022   Procedure: LEFT THUMB EXCISION MASS;  Surgeon: Sherilyn Cooter, MD;  Location: [place];  Service: Orthopedics;  Laterality: Left;   EYE SURGERY Bilateral 2020   cataracts removed   HERNIA REPAIR     HIATAL HERNIA REPAIR     LAPAROSCOPIC GASTRIC BANDING  [id number]   [facility] fundoplication   MENISCUS REPAIR Left 2012   ROBOT ASSISTED LAPAROSCOPIC RADICAL PROSTATECTOMY     ROTATOR CUFF REPAIR Right 2019   TOTAL KNEE ARTHROPLASTY Right 01/03/2021   Procedure: RIGHT TOTAL KNEE ARTHROPLASTY;  Surgeon: Mcarthur Rossetti, MD;  Location[facility];  Service: Orthopedics;  Laterality: Right;  Needs RNFA   TOTAL KNEE ARTHROPLASTY Left 04/25/2021   Procedure: LEFT TOTAL KNEE ARTHROPLASTY;  Surgeon: Mcarthur Rossetti, MD;  Location[facility];  Service: Orthopedics;  Laterality: Left;   Family History  Problem Relation Age of Onset   Heart disease Other        Early Cardiac disease for uncle is their 95's as well as deceased.   Heart failure Mother    Dementia Mother 80   Cancer Father 20       testicular, lung   Esophageal cancer Brother    Social History   Socioeconomic History   Marital status: Married    Spouse name: [name]   Number of children: 3   Years of education: 18   Highest education level: Master's degree (e.g., MA, MS, MEng, MEd, MSW, MBA)  Occupational History   Occupation: Retired    Comment: Nature conservation officer  Tobacco Use   Smoking status: Former    Packs/day: 1.00    Years: 20.00    Total pack years: 20.00    Types: Cigarettes    Quit date: 01/18/1993    Years since quitting: 29.7   Smokeless tobacco: Never  Vaping Use   Vaping Use: Never used  Substance and Sexual Activity   Alcohol use: Yes    Alcohol/week: 2.0 standard drinks of alcohol    Types: 2 Cans of beer per week    Comment: occasional    Drug use: No   Sexual activity: Yes  Other Topics Concern   Not on file  Social History Narrative   Patient is married and lives at home with is wife.    He has three adult children and five grandchildren.    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    Social Determinants of Health   Financial Resource Strain: Low Risk  (10/28/2022)   Overall Financial Resource Strain (CARDIA)    Difficulty of Paying Living Expenses: Not hard at all  Food Insecurity: No Food Insecurity (10/28/2022)   Hunger Vital Sign    Worried About Running Out of Food in the Last  Year: Never true    [place] in the Last Year: Never true  Transportation Needs: No Transportation Needs (10/28/2022)   PRAPARE - Hydrologist (Medical): No    Lack of Transportation (Non-Medical): No  Physical Activity: Insufficiently Active (10/28/2022)   Exercise Vital Sign    Days of Exercise per Week: 7 days    Minutes of Exercise per Session: 20 min  Stress: No Stress Concern Present (10/28/2022)   [place]    Feeling of Stress : Not at all  Social Connections: Moderately Integrated (10/28/2022)   Social Connection and Isolation Panel [NHANES]    Frequency of Communication with Friends and Family: More than three times a week    Frequency of Social Gatherings with Friends and Family: Three times a week    Attends Religious Services: Never    Active Member of Clubs or Organizations: Yes    Attends Music therapist: More than 4 times per year    Marital Status: Married    Tobacco Counseling Counseling given: Not Answered   Clinical Intake:  Pre-visit preparation completed: Yes  Pain : No/denies pain  Diabetes: No  How often do you need to have someone help you when you read instructions, pamphlets, or other written materials from your doctor or pharmacy?: 1 -  Never  Diabetic?No   Interpreter Needed?: No  Information entered by :: Denman George LPN   Activities of Daily Living    10/28/2022    3:19 PM 02/04/2022    8:37 AM  In your present state of health, do you have any difficulty performing the following activities:  Hearing? 0 0  Vision? 0 0  Difficulty concentrating or making decisions? 0 0  Walking or climbing stairs? 0 0  Dressing or bathing? 0 0  Doing errands, shopping? 0   Preparing Food and eating ? N   Using the Toilet? N   In the past six months, have you accidently leaked urine? N   Do you have problems with loss of bowel control? N   Managing your Medications? N   Managing your Finances? N   Housekeeping or managing your Housekeeping? N     Patient Care Team: Chevis Pretty, FNP as PCP - General (Family Medicine) Mcarthur Rossetti, MD as Consulting Physician (Orthopedic Surgery) Harlen Labs, MD as Referring Physician (Optometry) Jessy Oto, MD (Inactive) as Consulting Physician (Orthopedic Surgery) Irine Seal, MD as Attending Physician (Urology) Freada Bergeron, MD as Consulting Physician (Cardiology) Magnus Sinning, MD as Consulting Physician (Physical Medicine and Rehabilitation) [facility], [place], [place] any recent [place] you may have received from other than [facility] providers in the past year (date may be approximate).     Assessment:   This is a routine wellness examination for Victorio.  Hearing/Vision screen Hearing Screening - Comments:: Declines hearing difficulties  Vision Screening - Comments:: up to date with routine eye exams with [facility]   Dietary issues and exercise activities discussed: Current Exercise Habits: Home exercise routine, Type of exercise: walking, Time (Minutes): 20, Frequency (Times/Week): 7, Weekly Exercise (Minutes/Week): 140, Intensity: Mild   Goals Addressed               This  Visit's Progress     COMPLETED: Exercise 150 min/wk Moderate Activity        Using elliptical and lifting weights  are great options.       COMPLETED: Patient Stated (pt-stated)        10/24/2020 AWV Goal: Exercise for General Health  Patient will verbalize understanding of the benefits of increased physical activity: Exercising regularly is important. It will improve your overall fitness, flexibility, and endurance. Regular exercise also will improve your overall health. It can help you control your weight, reduce stress, and improve your bone density. Over the next year, patient will increase physical activity as tolerated with a goal of at least 150 minutes of moderate physical activity per week.  You can tell that you are exercising at a moderate intensity if your heart starts beating faster and you start breathing faster but can still hold a conversation. Moderate-intensity exercise ideas include: Walking 1 mile (1.6 km) in about 15 minutes Biking Hiking Golfing Dancing Water aerobics Patient will verbalize understanding of everyday activities that increase physical activity by providing examples like the following: Yard work, such as: Sales promotion account executive Gardening Washing windows or floors Patient will be able to explain general safety guidelines for exercising:  Before you start a new exercise program, talk with your health care provider. Do not exercise so much that you hurt yourself, feel dizzy, or get very short of breath. Wear comfortable clothes and wear shoes with good support. Drink plenty of water while you exercise to prevent dehydration or heat stroke. Work out until your breathing and your heartbeat get faster.        Depression Screen    10/28/2022    3:16 PM 07/31/2022    9:40 AM 06/04/2022    1:58 PM 03/02/2022   11:35 AM 01/21/2022    8:38 AM 12/01/2021    3:27 PM 10/27/2021     1:20 PM  PHQ 2/9 Scores  PHQ - 2 Score 0 1 0 0 0 0 0  PHQ- 9 Score  2 1   0     Fall Risk    10/28/2022    3:15 PM 07/31/2022    9:40 AM 06/04/2022    1:58 PM 03/02/2022   11:34 AM 12/01/2021    3:27 PM  Fall Risk   Falls in the past year? 0 0 0 1 0  Number falls in past yr: 0   0   Injury with Fall? 0   0   Risk for fall due to :    No Fall Risks   Follow up Falls prevention discussed;Education provided;Falls evaluation completed   Falls evaluation completed     FALL RISK PREVENTION PERTAINING TO THE HOME:  Any stairs in or around the home? Yes  If so, are there any without handrails? No  Home free of loose throw rugs in walkways, pet beds, electrical cords, etc? Yes  Adequate lighting in your home to reduce risk of falls? Yes   ASSISTIVE DEVICES UTILIZED TO PREVENT FALLS:  Life alert? No  Use of a cane, walker or w/c? No  Grab bars in the bathroom? Yes  Shower chair or bench in shower? No  Elevated toilet seat or a handicapped toilet? Yes   TIMED UP AND GO:  Was the test performed? No . Telephonic visit   Cognitive Function:    10/20/2018   12:41 PM 10/18/2017   10:52 AM 07/07/2016    9:10 AM  MMSE - Mini Mental State Exam  Orientation to time 5 5 5  $ Orientation to  Place 5 5 5  $ Registration 3 3 3  $ Attention/ Calculation 5 5 5  $ Recall 3 3 3  $ Language- name 2 objects 2 2 2  $ Language- repeat 1 1 1  $ Language- follow 3 step command 3 3 3  $ Language- read & follow direction 1 1 1  $ Write a sentence 1 1 1  $ Copy design 1 0 1  Total score 30 29 30        $ 10/28/2022    3:20 PM 10/24/2020    1:23 PM 10/23/2019    1:31 PM  6CIT Screen  What Year? 0 points 0 points 0 points  What month? 0 points 0 points 0 points  What time? 0 points 0 points 0 points  Count back from 20 0 points 0 points 0 points  Months in reverse 0 points 0 points 0 points  Repeat phrase 0 points 0 points 0 points  Total Score 0 points 0 points 0 points    Immunizations Immunization History   Administered Date(s) Administered   COVID-19, mRNA, vaccine(Comirnaty)12 years and older 07/14/2022   Fluad Quad(high Dose 65+) 06/21/2019, 07/02/2020, 06/18/2021, 06/04/2022   Influenza, High Dose Seasonal PF 07/07/2016, 06/15/2017, 07/14/2018   Influenza,inj,Quad PF,6+ Mos 06/27/2013, 06/25/2014, 06/28/2015   Moderna Covid-19 Vaccine Bivalent Booster 60yr & up 07/15/2021   Moderna SARS-COV2 Booster Vaccination 07/11/2020, 01/12/2021   Moderna Sars-Covid-2 Vaccination 10/20/2019, 11/18/2019   Pneumococcal Conjugate-13 06/28/2015   Pneumococcal Polysaccharide-23 10/27/2011   Respiratory Syncytial Virus Vaccine,Recomb Aduvanted(Arexvy) 06/25/2022   Tdap 07/07/2016   Zoster Recombinat (Shingrix) 12/09/2017, 02/16/2018    TDAP status: Up to date  Flu Vaccine status: Up to date  Pneumococcal vaccine status: Up to date  Covid-19 vaccine status: Information provided on how to obtain vaccines.   Qualifies for Shingles Vaccine? Yes   Zostavax completed No   Shingrix Completed?: Yes  Screening Tests Health Maintenance  Topic Date Due   Medicare Annual Wellness (AWV)  10/29/2023   DTaP/Tdap/Td (2 - Td or Tdap) 07/07/2026   Pneumonia Vaccine 77 Years old  Completed   INFLUENZA VACCINE  Completed   COVID-19 Vaccine  Completed   Hepatitis C Screening  Completed   Zoster Vaccines- Shingrix  Completed   HPV VACCINES  Aged Out   COLONOSCOPY (Pts 45-430yrInsurance coverage will need to be confirmed)  Discontinued    Health Maintenance  There are no preventive care reminders to display for this patient.  Colorectal cancer screening: No longer required.   Lung Cancer Screening: (Low Dose CT Chest recommended if Age 77-80ears, 30 pack-year currently smoking OR have quit w/in 15years.) does not qualify.   Lung Cancer Screening Referral: n/a   Additional Screening:  Hepatitis C Screening: does qualify; Completed 04/28/18  Vision Screening: Recommended annual ophthalmology exams  for early detection of glaucoma and other disorders of the eye. Is the patient up to date with their annual eye exam?  Yes  Who is the provider or what is the name of the office in which the patient attends annual eye exams? [facility] is not established with a provider, would they like to be referred to a provider to establish care? No .   Dental Screening: Recommended annual dental exams for proper oral hygiene  Community Resource Referral / Chronic Care Management: CRR required this visit?  No   CCM required this visit?  No      Plan:     I have personally reviewed and noted the following  in the patient's chart:   Medical and social history Use of alcohol, tobacco or illicit drugs  Current medications and supplements including opioid prescriptions. Patient is not currently taking opioid prescriptions. Functional ability and status Nutritional status Physical activity Advanced directives List of other physicians Hospitalizations, surgeries, and ER visits in previous 12 months Vitals Screenings to include cognitive, depression, and falls Referrals and appointments  In addition, I have reviewed and discussed with patient certain preventive protocols, quality metrics, and best practice recommendations. A written personalized care plan for preventive services as well as general preventive health recommendations were provided to patient.     Vanetta Mulders, [place]   075-GRM   Due to this being a virtual visit, the after visit summary with patients personalized plan was offered to patient via mail or my-chart. Patient would like to access on my-chart  Nurse Notes: No concerns

## 2022-11-09 ENCOUNTER — Encounter: Payer: Self-pay | Admitting: Nurse Practitioner

## 2022-11-09 ENCOUNTER — Telehealth (INDEPENDENT_AMBULATORY_CARE_PROVIDER_SITE_OTHER): Payer: Medicare Other | Admitting: Nurse Practitioner

## 2022-11-09 DIAGNOSIS — J4489 Other specified chronic obstructive pulmonary disease: Secondary | ICD-10-CM

## 2022-11-09 DIAGNOSIS — J069 Acute upper respiratory infection, unspecified: Secondary | ICD-10-CM | POA: Diagnosis not present

## 2022-11-09 MED ORDER — AZITHROMYCIN 250 MG PO TABS: ORAL_TABLET | ORAL | 0 refills | Status: DC

## 2022-11-09 MED ORDER — BENZONATATE 100 MG PO CAPS: 100.0000 mg | ORAL_CAPSULE | Freq: Three times a day (TID) | ORAL | 0 refills | Status: DC | PRN

## 2022-11-09 NOTE — Patient Instructions (Addendum)
[name], thank you for joining Chevis Pretty, FNP for today's virtual visit.  While this provider is not your primary care provider (PCP), if your PCP is located in our provider database this encounter information will be shared with them immediately following your visit.   [place] account gives you access to today's visit and all your visits, tests, and labs performed at [facility] " click here if you don't have a [facility] account or go to [email or web address]  Consent: (Patient) [name] [name] provided verbal consent for this virtual visit at the beginning of the encounter.  Current Medications:  Current Outpatient Medications:    azithromycin (ZITHROMAX Z-PAK) 250 MG tablet, As directed, Disp: 6 tablet, Rfl: 0   benzonatate (TESSALON PERLES) 100 MG capsule, Take 1 capsule (100 mg total) by mouth 3 (three) times daily as needed for cough., Disp: 20 capsule, Rfl: 0   amLODipine (NORVASC) 5 MG tablet, Take 1 tablet (5 mg total) by mouth daily., Disp: 90 tablet, Rfl: 1   aspirin 81 MG chewable tablet, Chew 1 tablet (81 mg total) by mouth 2 (two) times daily., Disp: 30 tablet, Rfl: 0   atenolol (TENORMIN) 25 MG tablet, Take 1 tablet (25 mg total) by mouth daily., Disp: 90 tablet, Rfl: 1   Cholecalciferol (VITAMIN D3) 25 MCG (1000 UT) CAPS, Take 1,000 Units by mouth daily., Disp: , Rfl:    citalopram (CELEXA) 20 MG tablet, Take 1 tablet (20 mg total) by mouth daily., Disp: 90 tablet, Rfl: 1   cyanocobalamin (VITAMIN B12) 1000 MCG/ML injection, Inject 1 mL (1,000 mcg total) into the muscle every 30 (thirty) days., Disp: 3 mL, Rfl: 1   ezetimibe (ZETIA) 10 MG tablet, TAKE ONE TABLET ONCE DAILY, Disp: 90 tablet, Rfl: 0   Fexofenadine-Pseudoephedrine (FEXOFENADINE-PSEUDOEPHED ER PO), Take 1 tablet by mouth daily., Disp: , Rfl:    fluticasone-salmeterol (ADVAIR HFA) 230-21 MCG/ACT inhaler, Inhale 2 puffs into the lungs 2 (two) times daily., Disp:  1 each, Rfl: 5   montelukast (SINGULAIR) 10 MG tablet, Take 1 tablet (10 mg total) by mouth daily., Disp: 90 tablet, Rfl: 1   pravastatin (PRAVACHOL) 20 MG tablet, Take 0.5 tablets (10 mg total) by mouth daily., Disp: 90 tablet, Rfl: 1   sildenafil (REVATIO) 20 MG tablet, Take 20 mg by mouth as needed., Disp: , Rfl:    Medications ordered in this encounter:  Meds ordered this encounter  Medications   azithromycin (ZITHROMAX Z-PAK) 250 MG tablet    Sig: As directed    Dispense:  6 tablet    Refill:  0    Order Specific Question:   Supervising Provider    Answer:   Caryl Pina A [id number]   benzonatate (TESSALON PERLES) 100 MG capsule    Sig: Take 1 capsule (100 mg total) by mouth 3 (three) times daily as needed for cough.    Dispense:  20 capsule    Refill:  0    Order Specific Question:   Supervising Provider    Answer:   Caryl Pina A [id number]     *If you need refills on other medications prior to your next appointment, please contact your pharmacy*  Follow-Up: Call back or seek an in-person evaluation if the symptoms worsen or if the condition fails to improve as anticipated.  [place]  Other Instructions 1. Take meds as prescribed 2. Use a cool mist humidifier especially during the winter months and when heat has  been humid. 3. Use saline nose sprays frequently 4. Saline irrigations of the nose can be very helpful if done frequently.  * 4X daily for 1 week*  * Use of a nettie pot can be helpful with this. Follow directions with this* 5. Drink plenty of fluids 6. Keep thermostat turn down low 7.For any cough or congestion- tessalon perles 8. For fever or aces or pains- take tylenol or ibuprofen appropriate for age and weight.  * for fevers greater than 101 orally you may alternate ibuprofen and tylenol every  3 hours.      If you have been instructed to have an in-person evaluation today at a local Urgent Care facility, please  use the link below. It will take you to a list of all of our available [place] [facility], including address, phone number and hours of operation. Please do not delay care.  [facility]  If you or a family member do not have a primary care provider, use the link below to schedule a visit and establish care. When you choose a [facility] primary care physician or advanced practice provider, you gain a long-term partner in health. Find a Primary Care Provider  Learn more about [facility] in-office and virtual care options: [place] [facility]

## 2022-11-09 NOTE — Progress Notes (Signed)
Virtual Visit Consent   [name] [name], you are scheduled for a virtual visit with Mary-Margaret Hassell Done, [place], a [facility] provider, today.     Just as with appointments in the office, your consent must be obtained to participate.  Your consent will be active for this visit and any virtual visit you may have with one of our providers in the next 365 days.     If you have a MyChart account, a copy of this consent can be sent to you electronically.  All virtual visits are billed to your insurance company just like a traditional visit in the office.    As this is a virtual visit, video technology does not allow for your provider to perform a traditional examination.  This may limit your provider's ability to fully assess your condition.  If your provider identifies any concerns that need to be evaluated in person or the need to arrange testing (such as labs, EKG, etc.), we will make arrangements to do so.     Although advances in technology are sophisticated, we cannot ensure that it will always work on either your end or our end.  If the connection with a video visit is poor, the visit may have to be switched to a telephone visit.  With either a video or telephone visit, we are not always able to ensure that we have a secure connection.     I need to obtain your verbal consent now.   Are you willing to proceed with your visit today? YES   [name] [name] has provided verbal consent on 11/09/2022 for a virtual visit (video or telephone).   Mary-Margaret Hassell Done, FNP   Date: 11/09/2022 12:15 PM   Virtual Visit via Video Note   I, Mary-Margaret Hassell Done, connected with [name] [name] ([id number], [birth year]) on 11/09/22 at 12:15 PM EST by a video-enabled telemedicine application and verified that I am speaking with the correct person using two identifiers.  Location: Patient: Virtual Visit Location Patient: Home Provider: Virtual Visit Location Provider: Mobile   I discussed the limitations of  evaluation and management by telemedicine and the availability of in person appointments. The patient expressed understanding and agreed to proceed.    History of Present Illness: [name] [name] is a 77 y.o. who identifies as a male who was assigned male at birth, and is being seen today for sinusitis.  HPI: Sinusitis This is a new problem. The current episode started in the past 7 days. The problem has been waxing and waning since onset. There has been no fever. Associated symptoms include congestion, coughing, ear pain and headaches. Pertinent negatives include no sinus pressure or sore throat. Past treatments include nasal decongestants and saline nose sprays (inhaler). The treatment provided mild relief.    Review of Systems  HENT:  Positive for congestion and ear pain. Negative for sinus pressure and sore throat.   Respiratory:  Positive for cough.   Neurological:  Positive for headaches.    Problems:  Patient Active Problem List   Diagnosis Date Noted   Subcutaneous nodule of left thumb    Distal interphalangeal nodule 01/20/2022   Status post total left knee replacement 04/25/2021   Status post total right knee replacement 01/03/2021   Diverticular disease of colon 09/25/2020   Statin intolerance 12/27/2018   BMI 28.0-28.9,adult 11/01/2018   H/O arthroscopy of shoulder 02/10/2018   Primary osteoarthritis of left knee 11/25/2016   Primary osteoarthritis of right knee 11/25/2016   Essential  hypertension 06/28/2015   H/O prostate cancer 06/28/2015   Hereditary and idiopathic peripheral neuropathy 04/17/2015   Carpal tunnel syndrome 04/17/2015   Hyperlipemia 06/02/2013   COPD with asthma 06/02/2013   Depression 06/02/2013   GERD 06/04/2009   PERCUTANEOUS TRANSLUMINAL CORONARY ANGIOPLASTY, HX OF 06/04/2009    Allergies:  Allergies  Allergen Reactions   Fenofibrate Other (See Comments)    Pt reports causes joint pains   Rosuvastatin Other (See Comments)    Pt reports  causes joint pains   Medications:  Current Outpatient Medications:    amLODipine (NORVASC) 5 MG tablet, Take 1 tablet (5 mg total) by mouth daily., Disp: 90 tablet, Rfl: 1   aspirin 81 MG chewable tablet, Chew 1 tablet (81 mg total) by mouth 2 (two) times daily., Disp: 30 tablet, Rfl: 0   atenolol (TENORMIN) 25 MG tablet, Take 1 tablet (25 mg total) by mouth daily., Disp: 90 tablet, Rfl: 1   Cholecalciferol (VITAMIN D3) 25 MCG (1000 UT) CAPS, Take 1,000 Units by mouth daily., Disp: , Rfl:    citalopram (CELEXA) 20 MG tablet, Take 1 tablet (20 mg total) by mouth daily., Disp: 90 tablet, Rfl: 1   cyanocobalamin (VITAMIN B12) 1000 MCG/ML injection, Inject 1 mL (1,000 mcg total) into the muscle every 30 (thirty) days., Disp: 3 mL, Rfl: 1   ezetimibe (ZETIA) 10 MG tablet, TAKE ONE TABLET ONCE DAILY, Disp: 90 tablet, Rfl: 0   Fexofenadine-Pseudoephedrine (FEXOFENADINE-PSEUDOEPHED ER PO), Take 1 tablet by mouth daily., Disp: , Rfl:    fluticasone-salmeterol (ADVAIR HFA) 230-21 MCG/ACT inhaler, Inhale 2 puffs into the lungs 2 (two) times daily., Disp: 1 each, Rfl: 5   montelukast (SINGULAIR) 10 MG tablet, Take 1 tablet (10 mg total) by mouth daily., Disp: 90 tablet, Rfl: 1   pravastatin (PRAVACHOL) 20 MG tablet, Take 0.5 tablets (10 mg total) by mouth daily., Disp: 90 tablet, Rfl: 1   sildenafil (REVATIO) 20 MG tablet, Take 20 mg by mouth as needed., Disp: , Rfl:   Observations/Objective: Patient is well-developed, well-nourished in no acute distress.  Resting comfortably  at home.  Head is normocephalic, atraumatic.  No labored breathing.  Speech is clear and coherent with logical content.  Patient is alert and oriented at baseline.  Raspy voice Dry cough  Assessment and Plan:  [name] in today with chief complaint of Sinusitis   1. URI with cough and congestion 1. Take meds as prescribed 2. Use a cool mist humidifier especially during the winter months and when heat has been  humid. 3. Use saline nose sprays frequently 4. Saline irrigations of the nose can be very helpful if done frequently.  * 4X daily for 1 week*  * Use of a nettie pot can be helpful with this. Follow directions with this* 5. Drink plenty of fluids 6. Keep thermostat turn down low 7.For any cough or congestion- tessalon perles 8. For fever or aces or pains- take tylenol or ibuprofen appropriate for age and weight.  * for fevers greater than 101 orally you may alternate ibuprofen and tylenol every  3 hours.    - azithromycin (ZITHROMAX Z-PAK) 250 MG tablet; As directed  Dispense: 6 tablet; Refill: 0 - benzonatate (TESSALON PERLES) 100 MG capsule; Take 1 capsule (100 mg total) by mouth 3 (three) times daily as needed for cough.  Dispense: 20 capsule; Refill: 0  2. COPD with asthma    Follow Up Instructions: I discussed the assessment and treatment plan with the patient. The  patient was provided an opportunity to ask questions and all were answered. The patient agreed with the plan and demonstrated an understanding of the instructions.  A copy of instructions were sent to the patient via MyChart.  The patient was advised to call back or seek an in-person evaluation if the symptoms worsen or if the condition fails to improve as anticipated.  Time:  I spent 6 minutes with the patient via telehealth technology discussing the above problems/concerns.    Mary-Margaret Hassell Done, FNP

## 2022-11-16 ENCOUNTER — Other Ambulatory Visit: Payer: Self-pay | Admitting: Nurse Practitioner

## 2022-11-30 ENCOUNTER — Ambulatory Visit (INDEPENDENT_AMBULATORY_CARE_PROVIDER_SITE_OTHER): Payer: Medicare Other | Admitting: Pulmonary Disease

## 2022-11-30 ENCOUNTER — Encounter (HOSPITAL_BASED_OUTPATIENT_CLINIC_OR_DEPARTMENT_OTHER): Payer: Self-pay | Admitting: Pulmonary Disease

## 2022-11-30 VITALS — BP 124/60 | HR 67 | Ht 74.0 in | Wt 253.2 lb

## 2022-11-30 DIAGNOSIS — J4489 Other specified chronic obstructive pulmonary disease: Secondary | ICD-10-CM | POA: Diagnosis not present

## 2022-11-30 NOTE — Progress Notes (Signed)
Subjective:   PATIENT ID: [name] [name] GENDER: male DOB: Jul 06, 1946, MRN: [id number]  Chief Complaint  Patient presents with   Follow-up    No updates or concerns, states still feeling about the same    Reason for Visit: Shortness of breath  [name] is a 77 year old male former smoker with COPD, HTN, HLD, hx prostate cancer in remission, basal cell carcinoma s/p excision and palpitations who presents for shortness of breath.  Initial consult 08/2022 He was recently seen in Cardiology clinic in December. Note from Dr. Johney Frame on 08/20/22 reviewed. He reported fatigue and shortness of breath at the time. Walking 2000-3000 stpes. Planning for repeat echo and cardiac work-up. Referred to Pulmonary to rule out lung related dyspnea  He reports since his 41s he has reported dyspnea on exertion with running and retired from [employer] at that time. He gets fatigued quickly and has to take deep breaths. Able to walk short distances but inclines are difficult. Reports associated coughing and wheezing sometimes. Cough is usually productive. Only uses symbicort as needed.   09/03/22 - PFTs consistent with COPD-asthma overlap. Baseline symptoms shortness of breath associated with productive cough and wheezing worsened with exertion. Started on Advair 2024 - Resolved cough and wheezing on Advair. DOE remains.  11/30/22 Since our last visit, he was treated with zpack for sinusitis/URI in Feb 2024. Since then has improved. Shortness of breath with heavy exertion and walking uphill. He is using Advair as instructed. Not using rescue inhaler as often. Has not regular exercised but interested restarting running. Denies cough or wheezing.  Social History: 1 ppd x 25 years. 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 Spent time overseas in [place] and [place] Sandblasting occasionally  Past Medical History:  Diagnosis Date   Arthritis    Asthma, chronic 06/02/2013   Carpal  tunnel syndrome 04/17/2015   Bilateral   COPD (chronic obstructive pulmonary disease) ([place])    Depression 06/02/2013   DYSPNEA ON EXERTION 06/04/2009   Qualifier: Diagnosis of  By: Domenic Polite, MD, Phillips Hay    Hereditary and idiopathic peripheral neuropathy 04/17/2015   Hyperlipemia 06/02/2013   Hypertension    LEG PAIN, BILATERAL 06/04/2009   Qualifier: Diagnosis of  By: Domenic Polite, MD, [place], HX OF 06/04/2009   Qualifier: Diagnosis of  By: Domenic Polite, MD, Phillips Hay    Prostate CA [facility])    and basal cell nose     Family History  Problem Relation Age of Onset   Heart disease Other        Early Cardiac disease for uncle is their 79's as well as deceased.   Heart failure Mother    Dementia Mother 2   Cancer Father 47       testicular, lung   Esophageal cancer Brother      Social History   Occupational History   Occupation: Retired    Comment: Nature conservation officer  Tobacco Use   Smoking status: Former    Packs/day: 1.00    Years: 20.00    Additional pack years: 0.00    Total pack years: 20.00    Types: Cigarettes    Quit date: 01/18/1993    Years since quitting: 29.8   Smokeless tobacco: Never  Vaping Use   Vaping Use: Never used  Substance and Sexual Activity   Alcohol use: Yes    Alcohol/week: 2.0 standard drinks of alcohol  Types: 2 Cans of beer per week    Comment: occasional   Drug use: No   Sexual activity: Yes    Allergies  Allergen Reactions   Fenofibrate Other (See Comments)    Pt reports causes joint pains   Rosuvastatin Other (See Comments)    Pt reports causes joint pains     Outpatient Medications Prior to Visit  Medication Sig Dispense Refill   amLODipine (NORVASC) 5 MG tablet Take 1 tablet (5 mg total) by mouth daily. 90 tablet 1   aspirin 81 MG chewable tablet Chew 1 tablet (81 mg total) by mouth 2 (two) times daily. 30 tablet 0   atenolol (TENORMIN) 25 MG tablet  Take 1 tablet (25 mg total) by mouth daily. 90 tablet 1   Cholecalciferol (VITAMIN D3) 25 MCG (1000 UT) CAPS Take 1,000 Units by mouth daily.     citalopram (CELEXA) 20 MG tablet Take 1 tablet (20 mg total) by mouth daily. 90 tablet 1   cyanocobalamin (VITAMIN B12) 1000 MCG/ML injection Inject 1 mL (1,000 mcg total) into the muscle every 30 (thirty) days. 3 mL 1   ezetimibe (ZETIA) 10 MG tablet TAKE ONE TABLET ONCE DAILY 90 tablet 0   fluticasone-salmeterol (ADVAIR HFA) 230-21 MCG/ACT inhaler Inhale 2 puffs into the lungs 2 (two) times daily. 1 each 5   montelukast (SINGULAIR) 10 MG tablet Take 1 tablet (10 mg total) by mouth daily. 90 tablet 1   pravastatin (PRAVACHOL) 20 MG tablet Take 0.5 tablets (10 mg total) by mouth daily. 90 tablet 1   sildenafil (REVATIO) 20 MG tablet Take 20 mg by mouth as needed.     Fexofenadine-Pseudoephedrine (FEXOFENADINE-PSEUDOEPHED ER PO) Take 1 tablet by mouth daily.     azithromycin (ZITHROMAX Z-PAK) 250 MG tablet As directed 6 tablet 0   benzonatate (TESSALON PERLES) 100 MG capsule Take 1 capsule (100 mg total) by mouth 3 (three) times daily as needed for cough. 20 capsule 0   No facility-administered medications prior to visit.    Review of Systems  Constitutional:  Negative for chills, diaphoresis, fever, malaise/fatigue and weight loss.  HENT:  Negative for congestion.   Respiratory:  Positive for shortness of breath. Negative for cough, hemoptysis, sputum production and wheezing.   Cardiovascular:  Negative for chest pain, palpitations and leg swelling.     Objective:   Vitals:   11/30/22 0927  BP: 124/60  Pulse: 67  SpO2: 97%  Weight: 253 lb 3.2 oz (114.9 kg)  Height: 6\' 2"  (1.88 m)   SpO2: 97 % O2 Device: None (Room air)  Physical Exam: General: Well-appearing, no acute distress HENT: , AT Eyes: EOMI, no scleral icterus Respiratory: Clear to auscultation bilaterally.  No crackles, wheezing or rales Cardiovascular: RRR, -M/R/G, no  JVD Extremities:-Edema,-tenderness Neuro: AAO x4, CNII-XII grossly intact Psych: Normal mood, normal affect  Data Reviewed:  Imaging: No chest imaging on file  PFT: 09/03/22 FVC 3.77 (77%) FEV1 2.51 (70%) Ratio 64  TLC 102% RV 157% RV/TLC 145% DLCO 107% Interpretation: Based on FEV1/SVC, mild obstructive defect with significant bronchodilator response. Air trapping present.  Labs: CBC    Component Value Date/Time   WBC 8.5 07/31/2022 1012   WBC 16.2 (H) 04/26/2021 0313   RBC 4.66 07/31/2022 1012   RBC 3.73 (L) 04/26/2021 0313   HGB 14.6 07/31/2022 1012   HCT 43.3 07/31/2022 1012   PLT 219 07/31/2022 1012   MCV 93 07/31/2022 1012   MCH 31.3 07/31/2022 1012   MCH  30.8 04/26/2021 0313   MCHC 33.7 07/31/2022 1012   MCHC 32.5 04/26/2021 0313   RDW 12.2 07/31/2022 1012   LYMPHSABS 3.7 (H) 07/31/2022 1012   MONOABS 0.5 10/08/2014 1811   EOSABS 0.2 07/31/2022 1012   BASOSABS 0.1 07/31/2022 1012   Absolute eos 07/31/22 - 200     Assessment & Plan:   Discussion: 77 year old male former smoker with COPD-asthma overlap, HTN, HLD, hx prostate cancer in remission, basal cell carcinoma s/p excision and palpitations who presents for follow-up. Discussed clinical course and management of COPD/asthma including bronchodilator regimen, exercise including rehab and action plan for exacerbation.  COPD-asthma overlap History tobacco abuse --CONTINUE Advair 230-21 mcg TWO puffs in morning and evening --CONTINUE Albuterol AS NEEDED for shortness of breath, chest tightness or wheezing --CONTINUE singulair 10 mg daily --START exercising 3-5 days a week. Reconsider pulmonary rehab at next visit   Health Maintenance Immunization History  Administered Date(s) Administered   COVID-19, mRNA, vaccine(Comirnaty)12 years and older 07/14/2022   Fluad Quad(high Dose 65+) 06/21/2019, 07/02/2020, 06/18/2021, 06/04/2022   Influenza, High Dose Seasonal PF 07/07/2016, 06/15/2017, 07/14/2018    Influenza,inj,Quad PF,6+ Mos 06/27/2013, 06/25/2014, 06/28/2015   Moderna Covid-19 Vaccine Bivalent Booster 51yrs & up 07/15/2021   Moderna SARS-COV2 Booster Vaccination 07/11/2020, 01/12/2021   Moderna Sars-Covid-2 Vaccination 10/20/2019, 11/18/2019   Pneumococcal Conjugate-13 06/28/2015   Pneumococcal Polysaccharide-23 10/27/2011   Respiratory Syncytial Virus Vaccine,Recomb Aduvanted(Arexvy) 06/25/2022   Tdap 07/07/2016   Zoster Recombinat (Shingrix) 12/09/2017, 02/16/2018   CT Lung Screen - not qualified. Quit smoking > 15 years  No orders of the defined types were placed in this encounter.  No orders of the defined types were placed in this encounter.   Return in about 3 months (around 03/02/2023).   I have spent a total time of 30-minutes on the day of the appointment including chart review, data review, collecting history, coordinating care and discussing medical diagnosis and plan with the patient/family. Past medical history, allergies, medications were reviewed. Pertinent imaging, labs and tests included in this note have been reviewed and interpreted independently by me.  [place], MD [facility] 11/30/2022 10:39 AM  Office Number [phone]

## 2022-11-30 NOTE — Patient Instructions (Signed)
COPD-asthma overlap History tobacco abuse --CONTINUE Advair 230-21 mcg TWO puffs in morning and evening --CONTINUE Albuterol AS NEEDED for shortness of breath, chest tightness or wheezing --CONTINUE singulair 10 mg daily --START exercising 3-5 days a week. Reconsider pulmonary rehab at next visit   Follow-up with me 3 months

## 2022-12-03 ENCOUNTER — Ambulatory Visit (INDEPENDENT_AMBULATORY_CARE_PROVIDER_SITE_OTHER): Payer: Medicare Other

## 2022-12-03 ENCOUNTER — Ambulatory Visit (INDEPENDENT_AMBULATORY_CARE_PROVIDER_SITE_OTHER): Payer: Medicare Other | Admitting: Nurse Practitioner

## 2022-12-03 ENCOUNTER — Encounter: Payer: Self-pay | Admitting: Nurse Practitioner

## 2022-12-03 VITALS — BP 134/71 | HR 59 | Temp 97.6°F | Resp 20 | Ht 74.0 in | Wt 255.0 lb

## 2022-12-03 DIAGNOSIS — E782 Mixed hyperlipidemia: Secondary | ICD-10-CM | POA: Diagnosis not present

## 2022-12-03 DIAGNOSIS — Z111 Encounter for screening for respiratory tuberculosis: Secondary | ICD-10-CM | POA: Diagnosis not present

## 2022-12-03 DIAGNOSIS — J4520 Mild intermittent asthma, uncomplicated: Secondary | ICD-10-CM

## 2022-12-03 DIAGNOSIS — Z6828 Body mass index (BMI) 28.0-28.9, adult: Secondary | ICD-10-CM | POA: Diagnosis not present

## 2022-12-03 DIAGNOSIS — I10 Essential (primary) hypertension: Secondary | ICD-10-CM | POA: Diagnosis not present

## 2022-12-03 DIAGNOSIS — K5730 Diverticulosis of large intestine without perforation or abscess without bleeding: Secondary | ICD-10-CM

## 2022-12-03 DIAGNOSIS — R739 Hyperglycemia, unspecified: Secondary | ICD-10-CM

## 2022-12-03 DIAGNOSIS — F3342 Major depressive disorder, recurrent, in full remission: Secondary | ICD-10-CM | POA: Diagnosis not present

## 2022-12-03 DIAGNOSIS — Z125 Encounter for screening for malignant neoplasm of prostate: Secondary | ICD-10-CM | POA: Diagnosis not present

## 2022-12-03 DIAGNOSIS — J4489 Other specified chronic obstructive pulmonary disease: Secondary | ICD-10-CM

## 2022-12-03 DIAGNOSIS — K219 Gastro-esophageal reflux disease without esophagitis: Secondary | ICD-10-CM | POA: Diagnosis not present

## 2022-12-03 DIAGNOSIS — G609 Hereditary and idiopathic neuropathy, unspecified: Secondary | ICD-10-CM

## 2022-12-03 LAB — BAYER DCA HB A1C WAIVED: HB A1C (BAYER DCA - WAIVED): 6.5 % — ABNORMAL HIGH (ref 4.8–5.6)

## 2022-12-03 MED ORDER — FLUTICASONE-SALMETEROL 230-21 MCG/ACT IN AERO: 2.0000 | INHALATION_SPRAY | Freq: Two times a day (BID) | RESPIRATORY_TRACT | 5 refills | Status: DC

## 2022-12-03 MED ORDER — CITALOPRAM HYDROBROMIDE 20 MG PO TABS: 20.0000 mg | ORAL_TABLET | Freq: Every day | ORAL | 1 refills | Status: DC

## 2022-12-03 MED ORDER — EZETIMIBE 10 MG PO TABS: 10.0000 mg | ORAL_TABLET | Freq: Every day | ORAL | 1 refills | Status: DC

## 2022-12-03 MED ORDER — ATENOLOL 25 MG PO TABS: 25.0000 mg | ORAL_TABLET | Freq: Every day | ORAL | 1 refills | Status: DC

## 2022-12-03 MED ORDER — AMLODIPINE BESYLATE 5 MG PO TABS: 5.0000 mg | ORAL_TABLET | Freq: Every day | ORAL | 1 refills | Status: DC

## 2022-12-03 MED ORDER — PRAVASTATIN SODIUM 20 MG PO TABS: 10.0000 mg | ORAL_TABLET | Freq: Every day | ORAL | 1 refills | Status: DC

## 2022-12-03 MED ORDER — MONTELUKAST SODIUM 10 MG PO TABS: 10.0000 mg | ORAL_TABLET | Freq: Every day | ORAL | 1 refills | Status: DC

## 2022-12-03 NOTE — Patient Instructions (Signed)
Cooking With Less Salt Cooking with less salt is one way to reduce the amount of sodium you get from food. Sodium is one of the elements that make up salt. It is found naturally in foods and is also added to certain foods. Depending on your condition and overall health, your health care provider or dietitian may recommend that you reduce your sodium intake. Most people should have less than 2,300 milligrams (mg) of sodium each day. If you have high blood pressure (hypertension), you may need to limit your sodium to 1,500 mg each day. Follow the tips below to help reduce your sodium intake. What are tips for eating less sodium? Reading food labels  Check the food label before buying or using packaged ingredients. Always check the label for the serving size and sodium content. Look for products with no more than 140 mg of sodium in one serving. Check the % Daily Value column to see what percent of the daily recommended amount of sodium is provided in one serving of the product. Foods with 5% or less in this column are considered low in sodium. Foods with 20% or higher are considered high in sodium. Do not choose foods with salt as one of the first three ingredients on the ingredients list. If salt is one of the first three ingredients, it usually means the item is high in sodium. Shopping Buy sodium-free or low-sodium products. Look for the following words on food labels: Low-sodium. Sodium-free. Reduced-sodium. No salt added. Unsalted. Always check the sodium content even if foods are labeled as low-sodium or no salt added. Buy fresh foods. Cooking Use herbs, seasonings without salt, and spices as substitutes for salt. Use sodium-free baking soda when baking. Grill, braise, or roast foods to add flavor with less salt. Avoid adding salt to pasta, rice, or hot cereals. Drain and rinse canned vegetables, beans, and meat before use. Avoid adding salt when cooking sweets and desserts. Cook with  low-sodium ingredients. What foods are high in sodium? Vegetables Regular canned vegetables (not low-sodium or reduced-sodium). Sauerkraut, pickled vegetables, and relishes. Olives. French fries. Onion rings. Regular canned tomato sauce and paste. Regular tomato and vegetable juice. Frozen vegetables in sauces. Grains Instant hot cereals. Bread stuffing, pancake, and biscuit mixes. Croutons. Seasoned rice or pasta mixes. Noodle soup cups. Boxed or frozen macaroni and cheese. Regular salted crackers. Self-rising flour. Rolls. Bagels. Flour tortillas and wraps. Meats and other proteins Meat or fish that is salted, canned, smoked, cured, spiced, or pickled. This includes bacon, ham, sausages, hot dogs, corned beef, chipped beef, meat loaves, salt pork, jerky, pickled herring, anchovies, regular canned tuna, and sardines. Salted nuts. Dairy Processed cheese and cheese spreads. Cheese curds. Blue cheese. Feta cheese. String cheese. Regular cottage cheese. Buttermilk. Canned milk. The items listed above may not be a complete list of foods high in sodium. Actual amounts of sodium may be different depending on processing. Contact a dietitian for more information. What foods are low in sodium? Fruits Fresh, frozen, or canned fruit with no sauce added. Fruit juice. Vegetables Fresh or frozen vegetables with no sauce added. "No salt added" canned vegetables. "No salt added" tomato sauce and paste. Low-sodium or reduced-sodium tomato and vegetable juice. Grains Noodles, pasta, quinoa, rice. Shredded or puffed wheat or puffed rice. Regular or quick oats (not instant). Low-sodium crackers. Low-sodium bread. Whole-grain bread and whole-grain pasta. Unsalted popcorn. Meats and other proteins Fresh or frozen whole meats, poultry (not injected with sodium), and fish with no sauce added.   Unsalted nuts. Dried peas, beans, and lentils without added salt. Unsalted canned beans. Eggs. Unsalted nut butters. Low-sodium  canned tuna or chicken. Dairy Milk. Soy milk. Yogurt. Low-sodium cheeses, such as Swiss, Monterey Jack, mozzarella, and ricotta. Sherbet or ice cream (keep to  cup per serving). Cream cheese. Fats and oils Unsalted butter or margarine. Other foods Homemade pudding. Sodium-free baking soda and baking powder. Herbs and spices. Low-sodium seasoning mixes. Beverages Coffee and tea. Carbonated beverages. The items listed above may not be a complete list of foods low in sodium. Actual amounts of sodium may be different depending on processing. Contact a dietitian for more information. What are some salt alternatives when cooking? The following are herbs, seasonings, and spices that can be used instead of salt to flavor your food. Herbs should be fresh or dried. Do not choose packaged mixes. Next to the name of the herb, spice, or seasoning are some examples of foods you can pair it with. Herbs Bay leaves - Soups, meat and vegetable dishes, and spaghetti sauce. Basil - Italian dishes, soups, pasta, and fish dishes. Cilantro - Meat, poultry, and vegetable dishes. Chili powder - Marinades and Mexican dishes. Chives - Salad dressings and potato dishes. Cumin - Mexican dishes, couscous, and meat dishes. Dill - Fish dishes, sauces, and salads. Fennel - Meat and vegetable dishes, breads, and cookies. Garlic (do not use garlic salt) - Italian dishes, meat dishes, salad dressings, and sauces. Marjoram - Soups, potato dishes, and meat dishes. Oregano - Pizza and spaghetti sauce. Parsley - Salads, soups, pasta, and meat dishes. Rosemary - Italian dishes, salad dressings, soups, and red meats. 

## 2022-12-03 NOTE — Progress Notes (Signed)
Subjective:    Patient ID: [name] [name], male    DOB: [birth year], 77 y.o.   MRN: [id number]   Chief Complaint: medical management of chronic issues     HPI:  [name] [name] is a 77 y.o. who identifies as a male who was assigned male at birth.   Social history: Lives with: wife Work history: retired   Scientist, forensic in today for follow up of the following chronic medical issues:  1. Essential hypertension No c/o chest pain, sob or headache. Does not check blood pressure at home. BP Readings from Last 3 Encounters:  11/30/22 124/60  09/03/22 126/60  08/31/22 124/60     2. Mixed hyperlipidemia Does try to watch diet some what. No dedicated exercise. Can only tolerate pravastatin. Lab Results  Component Value Date   CHOL 135 06/04/2022   HDL 46 06/04/2022   LDLCALC 59 06/04/2022   LDLDIRECT 92.8 08/28/2014   TRIG 182 (H) 06/04/2022   CHOLHDL 2.9 06/04/2022     3. Screening for prostate cancer No voiding issues . Has history or prostate cancer. Lab Results  Component Value Date   PSA1 <0.1 05/27/2021   PSA1 <0.1 10/11/2019   PSA1 <0.1 04/10/2019   PSA <0.1 01/11/2014   PSA <0.1 06/02/2013      4. Elevated blood sugar Does not check blood sugars at home. Lab Results  Component Value Date   HGBA1C 5.7 (H) 06/04/2022     5. COPD with asthma Uses advair daily. No c/o cough , wheezing or SOB. Saw pulmonologist last week. Wants him to start exercising ( running again )  6. Diverticular disease of colon No recent flareups  7. Gastroesophageal reflux disease, unspecified whether esophagitis present Uses OTC meds as needed  8. Hereditary and idiopathic peripheral neuropathy Has numbness in bil feet with occasional burning sensation..  9. Recurrent major depressive disorder, in full remission ([place]) Is on celexa and is doing well.    12/03/2022    2:08 PM 10/28/2022    3:16 PM 07/31/2022    9:40 AM  Depression screen PHQ 2/9  Decreased Interest 0 0 1   Down, Depressed, Hopeless 0 0 0  PHQ - 2 Score 0 0 1  Altered sleeping 1  0  Tired, decreased energy 0  1  Change in appetite 0  0  Feeling bad or failure about yourself  0  0  Trouble concentrating 0  0  Moving slowly or fidgety/restless 0  0  Suicidal thoughts 0  0  PHQ-9 Score 1  2  Difficult doing work/chores Not difficult at all  Somewhat difficult     10. BMI 28.0-28.9,adult No recent weight changes Wt Readings from Last 3 Encounters:  12/03/22 255 lb (115.7 kg)  11/30/22 253 lb 3.2 oz (114.9 kg)  10/28/22 250 lb (113.4 kg)   BMI Readings from Last 3 Encounters:  12/03/22 32.74 kg/m  11/30/22 32.51 kg/m  10/28/22 32.10 kg/m     New complaints: None today  Allergies  Allergen Reactions   Fenofibrate Other (See Comments)    Pt reports causes joint pains   Rosuvastatin Other (See Comments)    Pt reports causes joint pains   Outpatient Encounter Medications as of 12/03/2022  Medication Sig   amLODipine (NORVASC) 5 MG tablet Take 1 tablet (5 mg total) by mouth daily.   aspirin 81 MG chewable tablet Chew 1 tablet (81 mg total) by mouth 2 (two) times daily.   atenolol (TENORMIN) 25 MG  tablet Take 1 tablet (25 mg total) by mouth daily.   Cholecalciferol (VITAMIN D3) 25 MCG (1000 UT) CAPS Take 1,000 Units by mouth daily.   citalopram (CELEXA) 20 MG tablet Take 1 tablet (20 mg total) by mouth daily.   cyanocobalamin (VITAMIN B12) 1000 MCG/ML injection Inject 1 mL (1,000 mcg total) into the muscle every 30 (thirty) days.   ezetimibe (ZETIA) 10 MG tablet TAKE ONE TABLET ONCE DAILY   Fexofenadine-Pseudoephedrine (FEXOFENADINE-PSEUDOEPHED ER PO) Take 1 tablet by mouth daily.   fluticasone-salmeterol (ADVAIR HFA) 230-21 MCG/ACT inhaler Inhale 2 puffs into the lungs 2 (two) times daily.   montelukast (SINGULAIR) 10 MG tablet Take 1 tablet (10 mg total) by mouth daily.   pravastatin (PRAVACHOL) 20 MG tablet Take 0.5 tablets (10 mg total) by mouth daily.   sildenafil  (REVATIO) 20 MG tablet Take 20 mg by mouth as needed.   No facility-administered encounter medications on file as of 12/03/2022.    Past Surgical History:  Procedure Laterality Date   CARDIAC CATHETERIZATION  1997   EXCISION MASS NECK Left 11/12/2020   Procedure: EXCISION LEFT POSTERIOR NECK MASS;  Surgeon: Coralie Keens, MD;  Location: [facility];  Service: General;  Laterality: Left;   EXCISION METACARPAL MASS Left 02/04/2022   Procedure: LEFT THUMB EXCISION MASS;  Surgeon: Sherilyn Cooter, MD;  Location: [facility];  Service: Orthopedics;  Laterality: Left;   EYE SURGERY Bilateral 2020   cataracts removed   HERNIA REPAIR     HIATAL HERNIA REPAIR     LAPAROSCOPIC GASTRIC BANDING  [id number]   [facility] fundoplication   MENISCUS REPAIR Left 2012   ROBOT ASSISTED LAPAROSCOPIC RADICAL PROSTATECTOMY     ROTATOR CUFF REPAIR Right 2019   TOTAL KNEE ARTHROPLASTY Right 01/03/2021   Procedure: RIGHT TOTAL KNEE ARTHROPLASTY;  Surgeon: Mcarthur Rossetti, MD;  Location: [facility];  Service: Orthopedics;  Laterality: Right;  Needs RNFA   TOTAL KNEE ARTHROPLASTY Left 04/25/2021   Procedure: LEFT TOTAL KNEE ARTHROPLASTY;  Surgeon: Mcarthur Rossetti, MD;  Location: [facility];  Service: Orthopedics;  Laterality: Left;    Family History  Problem Relation Age of Onset   Heart disease Other        Early Cardiac disease for uncle is their 43's as well as deceased.   Heart failure Mother    Dementia Mother 47   Cancer Father 62       testicular, lung   Esophageal cancer Brother       Controlled substance contract: n/a     Review of Systems  Constitutional:  Negative for diaphoresis.  Eyes:  Negative for pain.  Respiratory:  Negative for shortness of breath.   Cardiovascular:  Negative for chest pain, palpitations and leg swelling.  Gastrointestinal:  Negative for abdominal pain.  Endocrine: Negative for polydipsia.  Skin:  Negative for rash.  Neurological:   Negative for dizziness, weakness and headaches.  Hematological:  Does not bruise/bleed easily.  All other systems reviewed and are negative.      Objective:   Physical Exam Vitals and nursing note reviewed.  Constitutional:      Appearance: Normal appearance. He is well-developed.  HENT:     Head: Normocephalic.     Nose: Nose normal.     Mouth/Throat:     Mouth: Mucous membranes are moist.     Pharynx: Oropharynx is clear.  Eyes:     Pupils: Pupils are equal, round, and reactive to light.  Neck:  Thyroid: No thyroid mass or thyromegaly.     Vascular: No carotid bruit or JVD.     Trachea: Phonation normal.  Cardiovascular:     Rate and Rhythm: Normal rate and regular rhythm.  Pulmonary:     Effort: Pulmonary effort is normal. No respiratory distress.     Breath sounds: Normal breath sounds.  Abdominal:     General: Bowel sounds are normal.     Palpations: Abdomen is soft.     Tenderness: There is no abdominal tenderness.  Musculoskeletal:        General: Normal range of motion.     Cervical back: Normal range of motion and neck supple.  Lymphadenopathy:     Cervical: No cervical adenopathy.  Skin:    General: Skin is warm and dry.  Neurological:     Mental Status: He is alert and oriented to person, place, and time.  Psychiatric:        Behavior: Behavior normal.        Thought Content: Thought content normal.        Judgment: Judgment normal.    BP 134/71   Pulse (!) 59   Temp 97.6 F (36.4 C) (Temporal)   Resp 20   Ht 6\' 2"  (1.88 m)   Wt 255 lb (115.7 kg)   SpO2 96%   BMI 32.74 kg/m   Hgba1c 6.5%  Chest xray- no acute or chronic findings-Preliminary reading by Ronnald Collum, FNP  [facility]      Assessment & Plan:  [name] comes in today with chief complaint of No chief complaint on file.   Diagnosis and orders addressed:  1. Essential hypertension Low sodium diet - CBC with Differential/Platelet - CMP14+EGFR - DG Chest 2 View -  amLODipine (NORVASC) 5 MG tablet; Take 1 tablet (5 mg total) by mouth daily.  Dispense: 90 tablet; Refill: 1 - atenolol (TENORMIN) 25 MG tablet; Take 1 tablet (25 mg total) by mouth daily.  Dispense: 90 tablet; Refill: 1  2. Mixed hyperlipidemia Low fat diet - Lipid panel - pravastatin (PRAVACHOL) 20 MG tablet; Take 0.5 tablets (10 mg total) by mouth daily.  Dispense: 90 tablet; Refill: 1 - ezetimibe (ZETIA) 10 MG tablet; Take 1 tablet (10 mg total) by mouth daily.  Dispense: 90 tablet; Refill: 1  3. Screening for prostate cancer Labs pending  4. Elevated blood sugar Watch sweets in diet - Bayer DCA Hb A1c Waived - Microalbumin / creatinine urine ratio  5. COPD with asthma Report any breathing issues Keep follow up with pulmonology - fluticasone-salmeterol (ADVAIR HFA) 230-21 MCG/ACT inhaler; Inhale 2 puffs into the lungs 2 (two) times daily.  Dispense: 1 each; Refill: 5  6. Diverticular disease of colon Continue to watch diet to prevent flareup  7. Gastroesophageal reflux disease, unspecified whether esophagitis present Avoid spicy foods Do not eat 2 hours prior to bedtime   8. Hereditary and idiopathic peripheral neuropathy Do not go bare footed Check feet daily  9. Recurrent major depressive disorder, in full remission ([place]) Stress management - citalopram (CELEXA) 20 MG tablet; Take 1 tablet (20 mg total) by mouth daily.  Dispense: 90 tablet; Refill: 1  10. BMI 28.0-28.9,adult Discussed diet and exercise for person with BMI >25 Will recheck weight in 3-6 months   11. Mild intermittent chronic asthma without complication - montelukast (SINGULAIR) 10 MG tablet; Take 1 tablet (10 mg total) by mouth daily.  Dispense: 90 tablet; Refill: 1   Labs pending Health Maintenance reviewed  Diet and exercise encouraged  Follow up plan: 6 months   Mary-Margaret Hassell Done, FNP

## 2022-12-04 LAB — CMP14+EGFR
ALT: 17 IU/L (ref 0–44)
AST: 20 IU/L (ref 0–40)
Albumin/Globulin Ratio: 1.5 (ref 1.2–2.2)
Albumin: 4.3 g/dL (ref 3.8–4.8)
Alkaline Phosphatase: 82 IU/L (ref 44–121)
BUN/Creatinine Ratio: 15 (ref 10–24)
BUN: 23 mg/dL (ref 8–27)
Bilirubin Total: 0.4 mg/dL (ref 0.0–1.2)
CO2: 26 mmol/L (ref 20–29)
Calcium: 9.9 mg/dL (ref 8.6–10.2)
Chloride: 97 mmol/L (ref 96–106)
Creatinine, Ser: 1.5 mg/dL — ABNORMAL HIGH (ref 0.76–1.27)
Globulin, Total: 2.8 g/dL (ref 1.5–4.5)
Glucose: 92 mg/dL (ref 70–99)
Potassium: 5.2 mmol/L (ref 3.5–5.2)
Sodium: 137 mmol/L (ref 134–144)
Total Protein: 7.1 g/dL (ref 6.0–8.5)
eGFR: 48 mL/min/{1.73_m2} — ABNORMAL LOW (ref >59)

## 2022-12-04 LAB — LIPID PANEL
Chol/HDL Ratio: 3.2 ratio (ref 0.0–5.0)
Cholesterol, Total: 156 mg/dL (ref 100–199)
HDL: 49 mg/dL (ref >39)
LDL Chol Calc (NIH): 83 mg/dL (ref 0–99)
Triglycerides: 140 mg/dL (ref 0–149)
VLDL Cholesterol Cal: 24 mg/dL (ref 5–40)

## 2022-12-04 LAB — CBC WITH DIFFERENTIAL/PLATELET
Basophils Absolute: 0.1 10*3/uL (ref 0.0–0.2)
Basos: 1 %
EOS (ABSOLUTE): 0.5 10*3/uL — ABNORMAL HIGH (ref 0.0–0.4)
Eos: 4 %
Hematocrit: 42.2 % (ref 37.5–51.0)
Hemoglobin: 14.1 g/dL (ref 13.0–17.7)
Immature Grans (Abs): 0 10*3/uL (ref 0.0–0.1)
Immature Granulocytes: 0 %
Lymphocytes Absolute: 3.7 10*3/uL — ABNORMAL HIGH (ref 0.7–3.1)
Lymphs: 30 %
MCH: 30.8 pg (ref 26.6–33.0)
MCHC: 33.4 g/dL (ref 31.5–35.7)
MCV: 92 fL (ref 79–97)
Monocytes Absolute: 0.7 10*3/uL (ref 0.1–0.9)
Monocytes: 5 %
Neutrophils Absolute: 7.4 10*3/uL — ABNORMAL HIGH (ref 1.4–7.0)
Neutrophils: 60 %
Platelets: 252 10*3/uL (ref 150–450)
RBC: 4.58 x10E6/uL (ref 4.14–5.80)
RDW: 13 % (ref 11.6–15.4)
WBC: 12.4 10*3/uL — ABNORMAL HIGH (ref 3.4–10.8)

## 2022-12-06 LAB — MICROALBUMIN / CREATININE URINE RATIO
Creatinine, Urine: 62.9 mg/dL
Microalb/Creat Ratio: <5 mg/g creat (ref 0–29)
Microalbumin, Urine: <3 ug/mL

## 2023-01-04 ENCOUNTER — Ambulatory Visit (INDEPENDENT_AMBULATORY_CARE_PROVIDER_SITE_OTHER): Payer: Medicare Other | Admitting: Family Medicine

## 2023-01-04 ENCOUNTER — Encounter: Payer: Self-pay | Admitting: Family Medicine

## 2023-01-04 VITALS — BP 136/72 | HR 63 | Ht 74.0 in | Wt 255.0 lb

## 2023-01-04 DIAGNOSIS — J441 Chronic obstructive pulmonary disease with (acute) exacerbation: Secondary | ICD-10-CM

## 2023-01-04 MED ORDER — AMOXICILLIN 500 MG PO CAPS: 500.0000 mg | ORAL_CAPSULE | Freq: Two times a day (BID) | ORAL | 0 refills | Status: DC

## 2023-01-04 MED ORDER — PREDNISONE 20 MG PO TABS: ORAL_TABLET | ORAL | 0 refills | Status: DC

## 2023-01-04 NOTE — Progress Notes (Signed)
BP 136/72   Pulse 63   Ht  (1.88 m)   Wt 255 lb (115.7 kg)   SpO2 96%   BMI 32.74 kg/m    Subjective:   Patient ID: [name] [name], male    DOB: 05-Mar-1946, 77 y.o.   MRN: [id number]  HPI: [name] [name] is a 77 y.o. male presenting on 01/04/2023 for URI (Started 4-5 days ago), Cough, and Ear Fullness (And pain in right ear)   HPI Patient is coming in with complaints of cough and congestion and ear fullness.  He has been having a cough and congestion and on mild amount of wheezing that is been going on over the past 4 to 5 days.  He says he has a history of COPD and does use Advair and has been using his ProAir as well.  He is also tried some over-the-counter stuff and Coricidin Delsym and Zyrtec-D and they help a little bit but not getting him over this.  He feels like he still having wheezing and had a lot of wheezing last night.  He denies any fevers and chills.  His cough has been productive.  Relevant past medical, surgical, family and social history reviewed and updated as indicated. Interim medical history since our last visit reviewed. Allergies and medications reviewed and updated.  Review of Systems  Constitutional:  Negative for chills and fever.  HENT:  Positive for congestion, postnasal drip, rhinorrhea and sinus pressure. Negative for ear discharge, ear pain, sneezing, sore throat and voice change.   Eyes:  Negative for pain, discharge, redness and visual disturbance.  Respiratory:  Positive for cough, shortness of breath (Ex[place]) and wheezing.   Cardiovascular:  Negative for chest pain and leg swelling.  Musculoskeletal:  Negative for back pain and gait problem.  Skin:  Negative for rash.  All other systems reviewed and are negative.   Per HPI unless specifically indicated above   Allergies as of 01/04/2023       Reactions   Fenofibrate Other (See Comments)   Pt reports causes joint pains   Rosuvastatin Other (See Comments)   Pt reports causes joint  pains        Medication List        Accurate as of January 04, 2023 10:49 AM. If you have any questions, ask your nurse or doctor.          amLODipine 5 MG tablet Commonly known as: NORVASC Take 1 tablet (5 mg total) by mouth daily.   amoxicillin 500 MG capsule Commonly known as: AMOXIL Take 1 capsule (500 mg total) by mouth 2 (two) times daily. Started by: Nils Pyle, MD   aspirin 81 MG chewable tablet Chew 1 tablet (81 mg total) by mouth 2 (two) times daily.   atenolol 25 MG tablet Commonly known as: TENORMIN Take 1 tablet (25 mg total) by mouth daily.   citalopram 20 MG tablet Commonly known as: CELEXA Take 1 tablet (20 mg total) by mouth daily.   cyanocobalamin 1000 MCG/ML injection Commonly known as: VITAMIN B12 Inject 1 mL (1,000 mcg total) into the muscle every 30 (thirty) days.   ezetimibe 10 MG tablet Commonly known as: ZETIA Take 1 tablet (10 mg total) by mouth daily.   FEXOFENADINE-PSEUDOEPHED ER PO Take 1 tablet by mouth daily.   fluticasone-salmeterol 230-21 MCG/ACT inhaler Commonly known as: Advair HFA Inhale 2 puffs into the lungs 2 (two) times daily.   montelukast 10 MG tablet Commonly known as: SINGULAIR  Take 1 tablet (10 mg total) by mouth daily.   pravastatin 20 MG tablet Commonly known as: PRAVACHOL Take 0.5 tablets (10 mg total) by mouth daily.   predniSONE 20 MG tablet Commonly known as: DELTASONE 2 po at same time daily for 5 days Started by: Elige Radon Lisandra Mathisen, MD   sildenafil 20 MG tablet Commonly known as: REVATIO Take 20 mg by mouth as needed.   Vitamin D3 25 MCG (1000 UT) Caps Take 1,000 Units by mouth daily.         Objective:   BP 136/72   Pulse 63   Ht  (1.88 m)   Wt 255 lb (115.7 kg)   SpO2 96%   BMI 32.74 kg/m   Wt Readings from Last 3 Encounters:  01/04/23 255 lb (115.7 kg)  12/03/22 255 lb (115.7 kg)  11/30/22 253 lb 3.2 oz (114.9 kg)    Physical Exam Vitals and nursing note  reviewed.  Constitutional:      General: He is not in acute distress.    Appearance: He is well-developed. He is not diaphoretic.  Eyes:     General: No scleral icterus.    Conjunctiva/sclera: Conjunctivae normal.  Neck:     Thyroid: No thyromegaly.  Cardiovascular:     Rate and Rhythm: Normal rate and regular rhythm.     Heart sounds: Normal heart sounds. No murmur heard. Pulmonary:     Effort: Pulmonary effort is normal. No respiratory distress.     Breath sounds: Normal breath sounds. No wheezing, rhonchi or rales.  Musculoskeletal:        General: No swelling. Normal range of motion.     Cervical back: Neck supple.  Lymphadenopathy:     Cervical: No cervical adenopathy.  Skin:    General: Skin is warm and dry.     Findings: No rash.  Neurological:     Mental Status: He is alert and oriented to person, place, and time.     Coordination: Coordination normal.  Psychiatric:        Behavior: Behavior normal.       Assessment & Plan:   Problem List Items Addressed This Visit   None Visit Diagnoses     COPD with acute exacerbation    -  Primary   Relevant Medications   predniSONE (DELTASONE) 20 MG tablet   amoxicillin (AMOXIL) 500 MG capsule       Give prednisone and amoxicillin and recommend continue with his inhalers and the Delsym and Coricidin and Zyrtec daily.  Call if not improving or worsening. Follow up plan: Return if symptoms worsen or fail to improve.  Counseling provided for all of the vaccine components No orders of the defined types were placed in this encounter.   Arville Care, MD [facility] 01/04/2023, 10:49 AM

## 2023-01-05 ENCOUNTER — Other Ambulatory Visit: Payer: Self-pay | Admitting: Nurse Practitioner

## 2023-02-24 ENCOUNTER — Encounter: Payer: Self-pay | Admitting: Family Medicine

## 2023-02-24 ENCOUNTER — Ambulatory Visit (INDEPENDENT_AMBULATORY_CARE_PROVIDER_SITE_OTHER): Payer: Medicare Other | Admitting: Family Medicine

## 2023-02-24 VITALS — BP 139/68 | HR 82 | Temp 97.7°F | Ht 74.0 in | Wt 258.0 lb

## 2023-02-24 DIAGNOSIS — L309 Dermatitis, unspecified: Secondary | ICD-10-CM

## 2023-02-24 MED ORDER — PREDNISONE 20 MG PO TABS: ORAL_TABLET | ORAL | 0 refills | Status: DC

## 2023-02-24 NOTE — Patient Instructions (Signed)
Rash, Adult A rash is a breakout of spots or blotches on the skin. It can affect the way the skin looks and feels. Many things can cause a rash. Common causes include: Viral infections. These include colds, measles, and hand, foot, and mouth disease. Bacterial infections. These include scarlet fever and impetigo. Fungal infections. These include athlete's foot, ringworm, and yeast rashes. Skin irritation. This may be from heat rash, exposure to moisture or friction for a long time (intertrigo), or exposure to soap or skin care products (eczema). Allergic reactions. These may be caused by foods, medicines, or things like poison ivy. Some rashes may go away after a few days. Others may last for a few weeks. The goal of treatment is to stop the itching and keep the rash from spreading. Follow these instructions at home: Medicine Take or apply over-the-counter and prescription medicines only as told by your health care provider. These may include: Corticosteroids. These can help treat red or swollen skin. They may be given as creams or as medicines to take by mouth (orally). Anti-itch lotions. Allergy medicines. Pain medicine. Antifungal medicine if the rash is from a fungal infection. Antibiotics if you have an infection.  Skin care Apply cool, wet cloths (compresses) to the affected areas. Do not scratch or rub your skin. Avoid covering the rash. Keep it exposed to air as often as you can. Managing itching and discomfort Avoid hot showers and baths. These can make itching worse. A cold shower may help. Try taking a bath with: Epsom salts. You can get these at your local pharmacy or grocery store. Follow the instructions on the package. Baking soda. Pour a small amount into the bath as told by your provider. Colloidal oatmeal. You can get this at your local pharmacy or grocery store. Follow the instructions on the package. Try putting baking soda paste on your skin. Stir water into baking  soda until it becomes like a paste. Try using calamine lotion or cortisone cream to help with itchiness. Keep cool. Stay out of the sun. Sweating and being hot can make itching worse. General instructions  Rest as needed. Drink enough fluid to keep your pee (urine) pale yellow. Wear loose-fitting clothes. Avoid scented soaps, detergents, and perfumes. Use gentle soaps, detergents, perfumes, and cosmetics. Avoid the things that cause your rash (triggers). Keep a journal to help keep track of your triggers. Write down: What you eat. What cosmetics you use. What you drink. What you wear. This includes jewelry. Contact a health care provider if: You sweat at night more than normal. You pee (urinate) more or less than normal, or your pee is a darker color than normal. Your eyes become sensitive to light. Your skin or the white parts of your eyes turn yellow (jaundice). Your skin tingles or is numb. You get painful blisters in your nose or mouth. Your rash does not go away after a few days, or it gets worse. You are more tired or thirsty than normal. You have new or worse symptoms. These may include: Pain in your abdomen. Fever. Diarrhea or vomiting. Weakness or weight loss. Get help right away if: You get confused. You have a severe headache, a stiff neck, or severe joint pain or stiffness. You become very sleepy or not responsive. You have a seizure. This information is not intended to replace advice given to you by your health care provider. Make sure you discuss any questions you have with your health care provider. Document Revised: 06/19/2022 Document Reviewed:   06/19/2022 Elsevier Patient Education  2024 [employer] Inc.  

## 2023-02-24 NOTE — Progress Notes (Signed)
   Acute Office Visit  Subjective:     Patient ID: [name] [name], male    DOB: 03-20-1946, 77 y.o.   MRN: [id number]  Chief Complaint  Patient presents with   Acute Visit    HPI Patient is in today for a rash to his left lower anterior leg. He tripped over some old car parts 2-3 weeks ago with jeans on and had small lacerations under his jeans on his left lower leg when this happened. He reports a rash started around the lacerations 2-3 days after with itching and blistering. This has been worsening. He has had clear yellow exudate. Lacerations have healed. Denies exposure to new products, plants, animals, food. He has tried benadryl cream, hydrocortisone cream, calamine lotion, antihistamines, and antibiotic cream without significant improvement. Denies fever.    ROS As per HPI.      Objective:    BP 139/68   Pulse 82   Temp 97.7 F (36.5 C) (Oral)   Ht 6\' 2"  (1.88 m)   Wt 258 lb (117 kg)   SpO2 96%   BMI 33.13 kg/m    Physical Exam Vitals and nursing note reviewed.  Constitutional:      General: He is not in acute distress.    Appearance: He is not ill-appearing, toxic-appearing or diaphoretic.  Pulmonary:     Effort: Pulmonary effort is normal. No respiratory distress.  Musculoskeletal:     Right lower leg: No edema.     Left lower leg: No edema.  Skin:    General: Skin is warm and dry.     Comments: 6 cm x 4 cm erythematous area to anterior left lower leg. Some small vesicles present. Clear thing yellow exudate present. No warmth.  Neurological:     General: No focal deficit present.     Mental Status: He is alert and oriented to person, place, and time.  Psychiatric:        Mood and Affect: Mood normal.        Behavior: Behavior normal.     No results found for any visits on 02/24/23.      Assessment & Plan:   [name] was seen today for acute visit.  Diagnoses and all orders for this visit:  Dermatitis Prednisone burst as below. Discussed  symptomatic care and return precautions.  -     predniSONE (DELTASONE) 20 MG tablet; 2 po at same time daily for 5 days   Return if symptoms worsen or fail to improve.  The patient indicates understanding of these issues and agrees with the plan.   Gabriel Earing, FNP

## 2023-02-25 NOTE — Progress Notes (Signed)
Cardiology Office Note:    Date:  03/01/2023   ID:  [name] [name], DOB Oct 14, 1945, MRN [id number]  PCP:  Bennie Pierini, FNP   [facility]  Cardiologist:  None  Advanced Practice Provider:  No care team member to display Electrophysiologist:  None   Referring MD: Daphine Deutscher, Mary-Margaret, *    History of Present Illness:    [name] [name] is a 77 y.o. male with a hx of COPD, hyperlipidemia, prior history of prostate cancer now in remission, basal cell carcinoma s/p excision and palpitations who was previously followed by Dr. Delton See who now returns to clinic for follow-up.  Seen in clinic by me on 11/19/20 prior to right TKA. We obtained myoview due to limited mobility in the setting of severe arthritis which showed no evidence of ischemia or infarction. He was cleared for his surgery.  Was last seen in clinic in 08/2022 where he was having more DOE. TTE showed EF 50-55%, normal GLS, normal RV, trivial AR.  Today, the patient overall feels okay. States he continues to have SOB and has since been diagnosed with COPD and follows with Dr. Everardo All. No chest pain, LE edema, orthopnea or PND. Hoping to start exercising and is considering pulmonary rehab. Otherwise, tolerating medications as prescribed. BP running 120-130/70s at home.  Past Medical History:  Diagnosis Date   Arthritis    Asthma, chronic 06/02/2013   Carpal tunnel syndrome 04/17/2015   Bilateral   COPD (chronic obstructive pulmonary disease) (HCC)    Depression 06/02/2013   DYSPNEA ON EXERTION 06/04/2009   Qualifier: Diagnosis of  By: Diona Browner, MD, Hollace Hayward    Hereditary and idiopathic peripheral neuropathy 04/17/2015   Hyperlipemia 06/02/2013   Hypertension    LEG PAIN, BILATERAL 06/04/2009   Qualifier: Diagnosis of  By: Diona Browner, MD, Hollace Hayward    PERCUTANEOUS TRANSLUMINAL CORONARY ANGIOPLASTY, HX OF 06/04/2009   Qualifier: Diagnosis of  By: Diona Browner, MD, Hollace Hayward    Prostate CA [facility])    and basal cell nose    Past Surgical History:  Procedure Laterality Date   CARDIAC CATHETERIZATION  1997   EXCISION MASS NECK Left 11/12/2020   Procedure: EXCISION LEFT POSTERIOR NECK MASS;  Surgeon: Abigail Miyamoto, MD;  Location: [facility];  Service: General;  Laterality: Left;   EXCISION METACARPAL MASS Left 02/04/2022   Procedure: LEFT THUMB EXCISION MASS;  Surgeon: Marlyne Beards, MD;  Location: [facility];  Service: Orthopedics;  Laterality: Left;   EYE SURGERY Bilateral 2020   cataracts removed   HERNIA REPAIR     HIATAL HERNIA REPAIR     LAPAROSCOPIC GASTRIC BANDING  2000   [facility] fundoplication   MENISCUS REPAIR Left 2012   ROBOT ASSISTED LAPAROSCOPIC RADICAL PROSTATECTOMY     ROTATOR CUFF REPAIR Right 2019   TOTAL KNEE ARTHROPLASTY Right 01/03/2021   Procedure: RIGHT TOTAL KNEE ARTHROPLASTY;  Surgeon: Kathryne Hitch, MD;  Location: [facility];  Service: Orthopedics;  Laterality: Right;  Needs RNFA   TOTAL KNEE ARTHROPLASTY Left 04/25/2021   Procedure: LEFT TOTAL KNEE ARTHROPLASTY;  Surgeon: Kathryne Hitch, MD;  Location: [facility];  Service: Orthopedics;  Laterality: Left;    Current Medications: Current Meds  Medication Sig   amLODipine (NORVASC) 5 MG tablet Take 1 tablet (5 mg total) by mouth daily.   aspirin 81 MG chewable tablet Chew 1 tablet (81 mg total) by mouth 2 (two) times daily.   atenolol (TENORMIN)  25 MG tablet Take 1 tablet (25 mg total) by mouth daily.   Cholecalciferol (VITAMIN D3) 25 MCG (1000 UT) CAPS Take 1,000 Units by mouth daily.   citalopram (CELEXA) 20 MG tablet Take 1 tablet (20 mg total) by mouth daily.   cyanocobalamin (VITAMIN B12) 1000 MCG/ML injection Inject 1 mL (1,000 mcg total) into the muscle every 30 (thirty) days.   ezetimibe (ZETIA) 10 MG tablet Take 1 tablet (10 mg total) by mouth daily.   fluticasone-salmeterol (ADVAIR HFA) 230-21 MCG/ACT inhaler Inhale  2 puffs into the lungs 2 (two) times daily.   montelukast (SINGULAIR) 10 MG tablet Take 1 tablet (10 mg total) by mouth daily.   pravastatin (PRAVACHOL) 20 MG tablet Take 0.5 tablets (10 mg total) by mouth daily.   sildenafil (REVATIO) 20 MG tablet Take 20 mg by mouth as needed.     Allergies:   Fenofibrate and Rosuvastatin   Social History   Socioeconomic History   Marital status: Married    Spouse name: [name]   Number of children: 3   Years of education: 18   Highest education level: Master's degree (e.g., MA, MS, MEng, MEd, MSW, MBA)  Occupational History   Occupation: Retired    Comment: Hotel manager  Tobacco Use   Smoking status: Former    Packs/day: 1.00    Years: 20.00    Additional pack years: 0.00    Total pack years: 20.00    Types: Cigarettes    Quit date: 01/18/1993    Years since quitting: 30.1   Smokeless tobacco: Never  Vaping Use   Vaping Use: Never used  Substance and Sexual Activity   Alcohol use: Yes    Alcohol/week: 2.0 standard drinks of alcohol    Types: 2 Cans of beer per week    Comment: occasional   Drug use: No   Sexual activity: Yes  Other Topics Concern   Not on file  Social History Narrative   Patient is married and lives at home with is wife.    He has three adult children and five grandchildren.    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    Social Determinants of Health   Financial Resource Strain: Low Risk  (10/28/2022)   Overall Financial Resource Strain (CARDIA)    Difficulty of Paying Living Expenses: Not hard at all  Food Insecurity: No Food Insecurity (10/28/2022)   Hunger Vital Sign    Worried About Running Out of Food in the Last Year: Never true    Ran Out of Food in the Last Year: Never true  Transportation Needs: No Transportation Needs (10/28/2022)   PRAPARE - Administrator, Civil Service (Medical): No    Lack of Transportation (Non-Medical): No  Physical Activity: Insufficiently  Active (10/28/2022)   Exercise Vital Sign    Days of Exercise per Week: 7 days    Minutes of Exercise per Session: 20 min  Stress: No Stress Concern Present (10/28/2022)   [employer] of Occupational Health - Occupational Stress Questionnaire    Feeling of Stress : Not at all  Social Connections: Moderately Integrated (10/28/2022)   Social Connection and Isolation Panel [NHANES]    Frequency of Communication with Friends and Family: More than three times a week    Frequency of Social Gatherings with Friends and Family: Three times a week    Attends Religious Services: Never    Active Member of Clubs or Organizations: Yes  Attends Engineer, structural: More than 4 times per year    Marital Status: Married     Family History: The patient's family history includes Cancer (age of onset: 44) in his father; Dementia (age of onset: 2) in his mother; Esophageal cancer in his brother; Heart disease in an other family member; Heart failure in his mother.  ROS:   Please see the history of present illness.      EKGs/Labs/Other Studies Reviewed:    The following studies were reviewed today:  Cardiac Studies & Procedures     STRESS TESTS  MYOCARDIAL PERFUSION IMAGING 11/25/2020  Narrative  The left ventricular ejection fraction is mildly decreased (45-54%).  Nuclear stress EF: 50%. Mild global hypokinesis.  There was no ST segment deviation noted during stress.  This is a low risk study. There is no evidence of old infarct or ischemia pattern. Apical thinning noted, normal variant. Chamber size mildly enlarged.  Donato Schultz, MD   ECHOCARDIOGRAM  ECHOCARDIOGRAM COMPLETE 09/18/2022  Narrative ECHOCARDIOGRAM REPORT    Patient Name:   KEBIN MERRICK Date of Exam: 09/18/2022 Medical Rec #:  [id number]      Height:       74.0 in Accession #:    [id number]     Weight:       257.4 lb Date of Birth:  Jul 21, 1946     BSA:          2.419 m Patient Age:    76 years       BP:            126/60 mmHg Patient Gender: M              HR:           63 bpm. Exam Location:  [address]  Procedure: 2D Echo, 3D Echo, Cardiac Doppler, Color Doppler and Strain Analysis  Indications:    R06.02 Shortness of Breath  History:        Patient has prior history of Echocardiogram examinations, most recent 07/31/2013. COPD, Arrythmias:Atrial Fibrillation, Signs/Symptoms:Dyspnea, Shortness of Breath and Chest Pain; Risk Factors:Hypertension, Dyslipidemia, Family History of Coronary Artery Disease and Former Smoker. Palpitations.  Sonographer:    Farrel Conners RDCS Referring Phys: Kathlynn Grate Osmara Drummonds  IMPRESSIONS   1. Left ventricular ejection fraction, by estimation, is 50 to 55%. The left ventricle has low normal function. The left ventricle has no regional wall motion abnormalities. Left ventricular diastolic parameters were normal. The average left ventricular global longitudinal strain is -21.1 %. The global longitudinal strain is normal. 2. Right ventricular systolic function is normal. The right ventricular size is normal. Tricuspid regurgitation signal is inadequate for assessing PA pressure. 3. Left atrial size was mild to moderately dilated. 4. The mitral valve is normal in structure. No evidence of mitral valve regurgitation. 5. The aortic valve is tricuspid. There is mild thickening of the aortic valve. Aortic valve regurgitation is trivial. Aortic valve sclerosis is present, with no evidence of aortic valve stenosis. 6. The inferior vena cava is normal in size with greater than 50% respiratory variability, suggesting right atrial pressure of 3 mmHg.  Comparison(s): Prior images unable to be directly viewed, comparison made by report only.  FINDINGS Left Ventricle: Left ventricular ejection fraction, by estimation, is 50 to 55%. The left ventricle has low normal function. The left ventricle has no regional wall motion abnormalities. The average left ventricular  global longitudinal strain is -21.1 %. The global longitudinal strain is normal. 3D  left ventricular ejection fraction analysis performed but not reported based on interpreter judgement due to suboptimal tracking. The left ventricular internal cavity size was normal in size. There is borderline concentric left ventricular hypertrophy. Left ventricular diastolic parameters were normal.  Right Ventricle: The right ventricular size is normal. No increase in right ventricular wall thickness. Right ventricular systolic function is normal. Tricuspid regurgitation signal is inadequate for assessing PA pressure.  Left Atrium: Left atrial size was mild to moderately dilated.  Right Atrium: Right atrial size was normal in size.  Pericardium: There is no evidence of pericardial effusion.  Mitral Valve: The mitral valve is normal in structure. No evidence of mitral valve regurgitation.  Tricuspid Valve: The tricuspid valve is normal in structure. Tricuspid valve regurgitation is not demonstrated.  Aortic Valve: The aortic valve is tricuspid. There is mild thickening of the aortic valve. Aortic valve regurgitation is trivial. Aortic valve sclerosis is present, with no evidence of aortic valve stenosis.  Pulmonic Valve: The pulmonic valve was grossly normal. Pulmonic valve regurgitation is trivial.  Aorta: The aortic root and ascending aorta are structurally normal, with no evidence of dilitation.  Venous: The inferior vena cava is normal in size with greater than 50% respiratory variability, suggesting right atrial pressure of 3 mmHg.  IAS/Shunts: No atrial level shunt detected by color flow Doppler.   LEFT VENTRICLE PLAX 2D LVIDd:         5.00 cm   Diastology LVIDs:         2.90 cm   LV e' medial:    6.20 cm/s LV PW:         1.20 cm   LV E/e' medial:  12.2 LV IVS:        1.20 cm   LV e' lateral:   12.00 cm/s LVOT diam:     2.80 cm   LV E/e' lateral: 6.3 LV SV:         103 LV SV Index:   43         2D Longitudinal Strain LVOT Area:     6.16 cm  2D Strain GLS (A2C):   -18.5 % 2D Strain GLS (A3C):   -22.5 % 2D Strain GLS (A4C):   -22.4 % 2D Strain GLS Avg:     -21.1 %  3D Volume EF: 3D EF:        68 % LV EDV:       219 ml LV ESV:       70 ml LV SV:        148 ml  RIGHT VENTRICLE RV Basal diam:  4.20 cm RV Mid diam:    3.50 cm RV S prime:     12.50 cm/s TAPSE (M-mode): 2.9 cm  LEFT ATRIUM             Index        RIGHT ATRIUM           Index LA diam:        4.60 cm 1.90 cm/m   RA Area:     15.80 cm LA Vol (A2C):   89.5 ml 36.99 ml/m  RA Volume:   43.40 ml  17.94 ml/m LA Vol (A4C):   74.6 ml 30.83 ml/m LA Biplane Vol: 85.5 ml 35.34 ml/m AORTIC VALVE LVOT Vmax:   73.25 cm/s LVOT Vmean:  49.300 cm/s LVOT VTI:    0.168 m  AORTA Ao Root diam: 3.40 cm Ao Asc diam:  3.40 cm  MITRAL  VALVE MV Area (PHT): cm         SHUNTS MV Decel Time: 230 msec    Systemic VTI:  0.17 m MV E velocity: 75.70 cm/s  Systemic Diam: 2.80 cm MV A velocity: 73.27 cm/s MV E/A ratio:  1.03  Mihai Croitoru MD Electronically signed by Thurmon Fair MD Signature Date/Time: 09/18/2022/11:00:52 AM    Final              EKG: No new tracing  Recent Labs: 12/03/2022: ALT 17; BUN 23; Creatinine, Ser 1.50; Hemoglobin 14.1; Platelets 252; Potassium 5.2; Sodium 137   Recent Lipid Panel    Component Value Date/Time   CHOL 156 12/03/2022 1410   TRIG 140 12/03/2022 1410   TRIG 244 (H) 10/24/2014 1201   HDL 49 12/03/2022 1410   HDL 43 10/24/2014 1201   CHOLHDL 3.2 12/03/2022 1410   CHOLHDL 7.0 (H) 08/31/2016 0800   VLDL 65 (H) 08/31/2016 0800   LDLCALC 83 12/03/2022 1410   LDLCALC 81 01/11/2014 0840   LDLDIRECT 92.8 08/28/2014 1041      Physical Exam:    VS:  BP 129/76   Pulse 61   Ht 6\' 2"  (1.88 m)   Wt 255 lb (115.7 kg)   SpO2 95%   BMI 32.74 kg/m     Wt Readings from Last 3 Encounters:  03/01/23 255 lb (115.7 kg)  02/24/23 258 lb (117 kg)  01/04/23 255 lb (115.7 kg)      GEN:  Well nourished, well developed in no acute distress HEENT: Normal NECK: No JVD; No carotid bruits CARDIAC: RRR, no murmurs, rubs, gallops RESPIRATORY:  CTAB, no wheezes ABDOMEN: Soft, non-tender, non-distended MUSCULOSKELETAL:  No edema; No deformity  SKIN: Warm and dry, no edema NEUROLOGIC:  Alert and oriented x 3 PSYCHIATRIC:  Normal affect   ASSESSMENT:    1. SOB (shortness of breath)   2. Essential hypertension   3. DOE (dyspnea on exertion)   4. Mixed hyperlipidemia   5. Chronic obstructive pulmonary disease, unspecified COPD type (HCC)      PLAN:    In order of problems listed above:  #DOE: #COPD: Likely related to COPD. Myoview 11/2020 with no ischemia or infarction. TTE 09/2022 with LVEF 50-55%, G1DD, no significant valve disease. Following with Dr. Everardo All. -Follow-up with Dr. Everardo All as scheduled -Agree with starting Pulmonary Rehab  #Palpitations : Resolved with no current symptoms. Cardiac monitor with 0% Afib. TTE with mild LA enlargement. -Continue to monitor -Continue ASA 81mg  daily -Continue atenolol 25mg  daily  #HTN:  Controlled at home 120-130s/70s. -Continue amlodipine 5mg  -Continue atenolol 25mg    #HLD: Intolerant to simva, atorva and crestor. LDL 83 on 11/2022 -Continue pravastatin 10mg  -Continue zetia 10mg  daily -Goal LDL <100  Follow-up:  6 months.  Medication Adjustments/Labs and Tests Ordered: Current medicines are reviewed at length with the patient today.  Concerns regarding medicines are outlined above.   No orders of the defined types were placed in this encounter.  No orders of the defined types were placed in this encounter.  Patient Instructions  Medication Instructions:   Your physician recommends that you continue on your current medications as directed. Please refer to the Current Medication list given to you today.  *If you need a refill on your cardiac medications before your next appointment, please call  your pharmacy*    Follow-Up: At [facility], you and your health needs are our priority.  As part of our continuing mission to provide you with  exceptional heart care, we have created designated Provider Care Teams.  These Care Teams include your primary Cardiologist (physician) and Advanced Practice Providers (APPs -  Physician Assistants and Nurse Practitioners) who all work together to provide you with the care you need, when you need it.  We recommend signing up for the patient portal called "MyChart".  Sign up information is provided on this After Visit Summary.  MyChart is used to connect with patients for Virtual Visits (Telemedicine).  Patients are able to view lab/test results, encounter notes, upcoming appointments, etc.  Non-urgent messages can be sent to your provider as well.   To learn more about what you can do with MyChart, go to [email or web address].    Your next appointment:   1 year(s)  Provider:   Rollene Rotunda, MD        Signed, Meriam Sprague, MD  03/01/2023 9:32 AM    [facility]

## 2023-03-01 ENCOUNTER — Ambulatory Visit: Payer: Medicare Other | Attending: Cardiology | Admitting: Cardiology

## 2023-03-01 ENCOUNTER — Encounter: Payer: Self-pay | Admitting: Cardiology

## 2023-03-01 VITALS — BP 129/76 | HR 61 | Ht 74.0 in | Wt 255.0 lb

## 2023-03-01 DIAGNOSIS — J449 Chronic obstructive pulmonary disease, unspecified: Secondary | ICD-10-CM | POA: Insufficient documentation

## 2023-03-01 DIAGNOSIS — I10 Essential (primary) hypertension: Secondary | ICD-10-CM | POA: Insufficient documentation

## 2023-03-01 DIAGNOSIS — R0602 Shortness of breath: Secondary | ICD-10-CM | POA: Diagnosis not present

## 2023-03-01 DIAGNOSIS — R0609 Other forms of dyspnea: Secondary | ICD-10-CM | POA: Insufficient documentation

## 2023-03-01 DIAGNOSIS — E782 Mixed hyperlipidemia: Secondary | ICD-10-CM | POA: Insufficient documentation

## 2023-03-01 NOTE — Patient Instructions (Signed)
Medication Instructions:  Your physician recommends that you continue on your current medications as directed. Please refer to the Current Medication list given to you today.  *If you need a refill on your cardiac medications before your next appointment, please call your pharmacy*  Follow-Up: At [facility], you and your health needs are our priority.  As part of our continuing mission to provide you with exceptional heart care, we have created designated Provider Care Teams.  These Care Teams include your primary Cardiologist (physician) and Advanced Practice Providers (APPs -  Physician Assistants and Nurse Practitioners) who all work together to provide you with the care you need, when you need it.  We recommend signing up for the patient portal called "MyChart".  Sign up information is provided on this After Visit Summary.  MyChart is used to connect with patients for Virtual Visits (Telemedicine).  Patients are able to view lab/test results, encounter notes, upcoming appointments, etc.  Non-urgent messages can be sent to your provider as well.   To learn more about what you can do with MyChart, go to [email or web address].    Your next appointment:   1 year(s)  Provider:   James Hochrein, MD      

## 2023-03-11 ENCOUNTER — Ambulatory Visit (INDEPENDENT_AMBULATORY_CARE_PROVIDER_SITE_OTHER): Payer: Medicare Other | Admitting: Pulmonary Disease

## 2023-03-11 ENCOUNTER — Encounter (HOSPITAL_BASED_OUTPATIENT_CLINIC_OR_DEPARTMENT_OTHER): Payer: Self-pay | Admitting: Pulmonary Disease

## 2023-03-11 VITALS — BP 130/68 | HR 62 | Temp 97.8°F | Ht 74.0 in | Wt 255.0 lb

## 2023-03-11 DIAGNOSIS — J441 Chronic obstructive pulmonary disease with (acute) exacerbation: Secondary | ICD-10-CM

## 2023-03-11 MED ORDER — PREDNISONE 10 MG PO TABS: ORAL_TABLET | ORAL | 0 refills | Status: AC

## 2023-03-11 NOTE — Progress Notes (Signed)
Subjective:   PATIENT ID: [name] [name]: male DOB: 1946/07/28, MRN: [id number]  Chief Complaint  Patient presents with   Follow-up    Follow up.     Reason for Visit: Shortness of breath  [name] is a 77 year old male former smoker with COPD, HTN, HLD, hx prostate cancer in remission, basal cell carcinoma s/p excision and palpitations who presents for shortness of breath.  Initial consult 08/2022 He was recently seen in Cardiology clinic in December. Note from Dr. Shari Prows on 08/20/22 reviewed. He reported fatigue and shortness of breath at the time. Walking 2000-3000 stpes. Planning for repeat echo and cardiac work-up. Referred to Pulmonary to rule out lung related dyspnea  He reports since his 78s he has reported dyspnea on exertion with running and retired from [employer] at that time. He gets fatigued quickly and has to take deep breaths. Able to walk short distances but inclines are difficult. Reports associated coughing and wheezing sometimes. Cough is usually productive. Only uses symbicort as needed.   09/03/22 - PFTs consistent with COPD-asthma overlap. Baseline symptoms shortness of breath associated with productive cough and wheezing worsened with exertion. Started on Advair 2024 - Resolved cough and wheezing on Advair. DOE remains.  11/30/22 Since our last visit, he was treated with zpack for sinusitis/URI in Feb 2024. Since then has improved. Shortness of breath with heavy exertion and walking uphill. He is using Advair as instructed. Not using rescue inhaler as often. Has not regular exercised but interested restarting running. Denies cough or wheezing.  03/11/23 Since our last visit he had an exacerbation April requiring amoxicillin and prednisone. He developed shortness of breath and productive cough that began three weeks ago. Is feeling better some but having wheezing at night when laying down. Compliant with Advair and using rescue inhaler 3 times a  week and tries not to take it often. Wheezing improves after sputum production. Denies fever, chills.  Social History: 1 ppd x 25 years. 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 Spent time overseas in [place] and [place] Sandblasting occasionally  Past Medical History:  Diagnosis Date   Arthritis    Asthma, chronic 06/02/2013   Carpal tunnel syndrome 04/17/2015   Bilateral   COPD (chronic obstructive pulmonary disease) (HCC)    Depression 06/02/2013   DYSPNEA ON EXERTION 06/04/2009   Qualifier: Diagnosis of  By: Diona Browner, MD, Hollace Hayward    Hereditary and idiopathic peripheral neuropathy 04/17/2015   Hyperlipemia 06/02/2013   Hypertension    LEG PAIN, BILATERAL 06/04/2009   Qualifier: Diagnosis of  By: Diona Browner, MD, Hollace Hayward    PERCUTANEOUS TRANSLUMINAL CORONARY ANGIOPLASTY, HX OF 06/04/2009   Qualifier: Diagnosis of  By: Diona Browner, MD, Hollace Hayward    Prostate CA [facility])    and basal cell nose     Family History  Problem Relation Age of Onset   Heart disease Other        Early Cardiac disease for uncle is their 56's as well as deceased.   Heart failure Mother    Dementia Mother 79   Cancer Father 25       testicular, lung   Esophageal cancer Brother      Social History   Occupational History   Occupation: Retired    Comment: Hotel manager  Tobacco Use   Smoking status: Former    Packs/day: 1.00    Years: 20.00    Additional pack years: 0.00  Total pack years: 20.00    Types: Cigarettes    Quit date: 01/18/1993    Years since quitting: 30.1   Smokeless tobacco: Never  Vaping Use   Vaping Use: Never used  Substance and Sexual Activity   Alcohol use: Yes    Alcohol/week: 2.0 standard drinks of alcohol    Types: 2 Cans of beer per week    Comment: occasional   Drug use: No   Sexual activity: Yes    Allergies  Allergen Reactions   Fenofibrate Other (See Comments)    Pt reports causes joint pains   Rosuvastatin  Other (See Comments)    Pt reports causes joint pains     Outpatient Medications Prior to Visit  Medication Sig Dispense Refill   amLODipine (NORVASC) 5 MG tablet Take 1 tablet (5 mg total) by mouth daily. 90 tablet 1   aspirin 81 MG chewable tablet Chew 1 tablet (81 mg total) by mouth 2 (two) times daily. 30 tablet 0   atenolol (TENORMIN) 25 MG tablet Take 1 tablet (25 mg total) by mouth daily. 90 tablet 1   Cholecalciferol (VITAMIN D3) 25 MCG (1000 UT) CAPS Take 1,000 Units by mouth daily.     citalopram (CELEXA) 20 MG tablet Take 1 tablet (20 mg total) by mouth daily. 90 tablet 1   cyanocobalamin (VITAMIN B12) 1000 MCG/ML injection Inject 1 mL (1,000 mcg total) into the muscle every 30 (thirty) days. 3 mL 2   ezetimibe (ZETIA) 10 MG tablet Take 1 tablet (10 mg total) by mouth daily. 90 tablet 1   fluticasone-salmeterol (ADVAIR HFA) 230-21 MCG/ACT inhaler Inhale 2 puffs into the lungs 2 (two) times daily. 1 each 5   montelukast (SINGULAIR) 10 MG tablet Take 1 tablet (10 mg total) by mouth daily. 90 tablet 1   pravastatin (PRAVACHOL) 20 MG tablet Take 0.5 tablets (10 mg total) by mouth daily. 90 tablet 1   sildenafil (REVATIO) 20 MG tablet Take 20 mg by mouth as needed.     No facility-administered medications prior to visit.    Review of Systems  Constitutional:  Negative for chills, diaphoresis, fever, malaise/fatigue and weight loss.  HENT:  Negative for congestion.   Respiratory:  Positive for cough, sputum production, shortness of breath and wheezing. Negative for hemoptysis.   Cardiovascular:  Negative for chest pain, palpitations and leg swelling.     Objective:   Vitals:   03/11/23 1028  BP: 130/68  Pulse: 62  Temp: 97.8 F (36.6 C)  TempSrc: Oral  SpO2: 98%  Weight: 255 lb (115.7 kg)  Height: 6\' 2"  (1.88 m)   SpO2: 98 % O2 Device: None (Room air)  Physical Exam: General: Well-appearing, no acute distress HENT: [place], AT Eyes: EOMI, no scleral  icterus Respiratory: Clear to auscultation bilaterally.  No crackles, wheezing or rales Cardiovascular: RRR, -M/R/G, no JVD Extremities:-Edema,-tenderness Neuro: AAO x4, CNII-XII grossly intact Psych: Normal mood, normal affect   Data Reviewed:  Imaging: No chest imaging on file  PFT: 09/03/22 FVC 3.77 (77%) FEV1 2.51 (70%) Ratio 64  TLC 102% RV 157% RV/TLC 145% DLCO 107% Interpretation: Based on FEV1/SVC, mild obstructive defect with significant bronchodilator response. Air trapping present.  Labs: CBC    Component Value Date/Time   WBC 12.4 (H) 12/03/2022 1410   WBC 16.2 (H) 04/26/2021 0313   RBC 4.58 12/03/2022 1410   RBC 3.73 (L) 04/26/2021 0313   HGB 14.1 12/03/2022 1410   HCT 42.2 12/03/2022 1410   PLT 252  12/03/2022 1410   MCV 92 12/03/2022 1410   MCH 30.8 12/03/2022 1410   MCH 30.8 04/26/2021 0313   MCHC 33.4 12/03/2022 1410   MCHC 32.5 04/26/2021 0313   RDW 13.0 12/03/2022 1410   LYMPHSABS 3.7 (H) 12/03/2022 1410   MONOABS 0.5 10/08/2014 1811   EOSABS 0.5 (H) 12/03/2022 1410   BASOSABS 0.1 12/03/2022 1410   Absolute eos 07/31/22 - 200     Assessment & Plan:   Discussion: 77 year old male former smoker with COPD-asthma overlap, HTN, HLD, hx prostate cancer in remission, basal cell carcinoma s/p excision and palpitations who presents for follow-up. In exacerbation. Discussed clinical course and management of COPD/asthma including bronchodilator regimen, exercise including rehab and action plan for exacerbation.  COPD-asthma overlap - exacerbation History tobacco abuse --Prednisone taper --CONTINUE Advair 230-21 mcg TWO puffs in morning and evening --CONTINUE Albuterol AS NEEDED for shortness of breath, chest tightness or wheezing --CONTINUE singulair 10 mg daily --START exercising 3-5 days a week. Reconsider pulmonary rehab at next visit   Health Maintenance Immunization History  Administered Date(s) Administered   COVID-19, mRNA, vaccine(Comirnaty)12  years and older 07/14/2022   Fluad Quad(high Dose 65+) 06/21/2019, 07/02/2020, 06/18/2021, 06/04/2022   Influenza, High Dose Seasonal PF 07/07/2016, 06/15/2017, 07/14/2018   Influenza,inj,Quad PF,6+ Mos 06/27/2013, 06/25/2014, 06/28/2015   Moderna Covid-19 Vaccine Bivalent Booster 68yrs & up 07/15/2021   Moderna SARS-COV2 Booster Vaccination 07/11/2020, 01/12/2021   Moderna Sars-Covid-2 Vaccination 10/20/2019, 11/18/2019   Pneumococcal Conjugate-13 06/28/2015   Pneumococcal Polysaccharide-23 10/27/2011   Respiratory Syncytial Virus Vaccine,Recomb Aduvanted(Arexvy) 06/25/2022   Tdap 07/07/2016   Zoster Recombinat (Shingrix) 12/09/2017, 02/16/2018   CT Lung Screen - not qualified. Quit smoking > 15 years  No orders of the defined types were placed in this encounter.  Meds ordered this encounter  Medications   predniSONE (DELTASONE) 10 MG tablet    Sig: Take 4 tablets (40 mg total) by mouth daily with breakfast for 2 days, THEN 3 tablets (30 mg total) daily with breakfast for 2 days, THEN 2 tablets (20 mg total) daily with breakfast for 2 days, THEN 1 tablet (10 mg total) daily with breakfast for 2 days.    Dispense:  20 tablet    Refill:  0    Return in about 3 months (around 06/11/2023).   I have spent a total time of 34-minutes on the day of the appointment including chart review, data review, collecting history, coordinating care and discussing medical diagnosis and plan with the patient/family. Past medical history, allergies, medications were reviewed. Pertinent imaging, labs and tests included in this note have been reviewed and interpreted independently by me.  Mclane Arora Mechele Collin, MD [facility] 03/11/2023 10:52 AM  Office Number [phone]

## 2023-03-11 NOTE — Patient Instructions (Signed)
COPD-asthma overlap - exacerbation History tobacco abuse --Prednisone taper --CONTINUE Advair 230-21 mcg TWO puffs in morning and evening --CONTINUE Albuterol AS NEEDED for shortness of breath, chest tightness or wheezing --CONTINUE singulair 10 mg daily --CONTINUE exercising 3-5 days a week. Reconsider pulmonary rehab at next visit

## 2023-04-26 ENCOUNTER — Ambulatory Visit: Payer: Medicare Other | Admitting: Orthopedic Surgery

## 2023-05-03 ENCOUNTER — Ambulatory Visit: Payer: Medicare Other | Admitting: Orthopedic Surgery

## 2023-05-10 ENCOUNTER — Ambulatory Visit (INDEPENDENT_AMBULATORY_CARE_PROVIDER_SITE_OTHER): Payer: Medicare Other | Admitting: Orthopedic Surgery

## 2023-05-10 ENCOUNTER — Other Ambulatory Visit (INDEPENDENT_AMBULATORY_CARE_PROVIDER_SITE_OTHER): Payer: Medicare Other

## 2023-05-10 DIAGNOSIS — G5603 Carpal tunnel syndrome, bilateral upper limbs: Secondary | ICD-10-CM

## 2023-05-10 DIAGNOSIS — M79642 Pain in left hand: Secondary | ICD-10-CM

## 2023-05-10 NOTE — Progress Notes (Signed)
[name] [name] - 77 y.o. male MRN [id number]  Date of birth: Mar 17, 1946  Office Visit Note: Visit Date: 05/10/2023 PCP: [name] Pierini, FNP Referred by: [name] [name], [name] [name], *  Subjective: Chief Complaint  Patient presents with   Left Hand - Pain   HPI: [name] [name] is a pleasant 77 y.o. male who presents today for evaluation of ongoing numbness and tingling in bilateral hands, left greater than right.  States this been present for multiple years, worsening in nature.  Also does have nocturnal symptoms on a regular basis.  Has not undergone prior workup or treatments.  Pertinent ROS were reviewed with the patient and found to be negative unless otherwise specified above in HPI.   Visit Reason: Bilateral hands, numbness and tingling Hand dominance: right Occupation: retired Diabetic: No Heart/Lung History: COPD Blood Thinners:  baby aspirin  Prior Testing/EMG: none Injections (Date): none Treatments: none Prior Surgery: none   Assessment & Plan: Visit Diagnoses:  1. Pain in left hand     Plan: Extensive discussion was had the patient today regarding his bilateral hand complaints.  From a clinical standpoint, he does appear to be exhibiting signs and symptoms consistent with carpal tunnel syndrome bilaterally.  He also has ongoing left thumb CMC osteoarthritis based on his clinical and radiographic workup, however this is fairly mild in nature from a clinical standpoint currently.    I would like to obtain a nerve study to better investigate possible nerve compression severity in order to help guide treatment options.  I did offer him wrist braces today, particular for nocturnal symptoms, however he was not interested in this.  He will return to me after the nerve studies completed for further discussion.  Follow-up: Return After EMG bilateral upper extremities.   Meds & Orders: No orders of the defined types were placed in this encounter.   Orders Placed  This Encounter  Procedures   XR Wrist Complete Left   Ambulatory referral to Physical Medicine Rehab     Procedures: No procedures performed      Clinical History: MRI LUMBAR SPINE WITHOUT AND WITH CONTRAST   TECHNIQUE: Multiplanar and multiecho pulse sequences of the lumbar spine were obtained without and with intravenous contrast.   CONTRAST:  20mL MULTIHANCE GADOBENATE DIMEGLUMINE 529 MG/ML IV SOLN   COMPARISON:  07/21/2020   FINDINGS: Segmentation:  5 lumbar type vertebrae based on prior   Alignment:  Grade 1 anterolisthesis at L4-5, mildly progressed.   Vertebrae:  No fracture, evidence of discitis, or bone lesion.   Conus medullaris and cauda equina: Conus extends to the T12-L1 level. Conus and cauda equina appear normal.   Paraspinal and other soft tissues: Lower pole renal cyst on the left.   Disc levels:   T12- L1: Spondylosis with ventral spur that is likely bridging   L1-L2: Fairly bulky ventral spondylitic spur.  No impingement   L2-L3: Ventral spondylitic spurring.  Mild disc bulging.   L3-L4: Disc narrowing and bulging with ventral spurring. Bilateral facet spurring and ligamentum flavum thickening. Anterior and medial synovial cyst from the left facet measuring 5 mm. No neural compression.   L4-L5: Advanced facet degeneration with spurring and anterolisthesis. Bilobed anterior synovial cyst measuring 14 mm with compression of the right L4 nerve root. Patent left foramen. Moderate spinal stenosis   L5-S1:Facet osteoarthritis with asymmetric bulky spurring. Eccentric right disc bulging and far-lateral spurring. Moderate right foraminal stenosis.   IMPRESSION: 1. Generalized lumbar spine degeneration with progression from  2021. 2. L4-5 severe facet osteoarthritis with anterolisthesis and 14 mm right synovial cyst that compresses the right L4 nerve root. Moderate spinal stenosis at this level. 3. L3-4 left facet synovial cyst which is new but  noncompressive.     Electronically Signed   By: Tiburcio Pea M.D.   On: 10/28/2021 19:27  He reports that he quit smoking about 30 years ago. His smoking use included cigarettes. He started smoking about 50 years ago. He has a 20 pack-year smoking history. He has never used smokeless tobacco.  Recent Labs    06/04/22 1357 12/03/22 1408  HGBA1C 5.7* 6.5*    Objective:   Vital Signs: There were no vitals taken for this visit.  Physical Exam  Gen: Well-appearing, in no acute distress; non-toxic CV: Regular Rate. Well-perfused. Warm.  Resp: Breathing unlabored on room air; no wheezing. Psych: Fluid speech in conversation; appropriate affect; normal thought process  Ortho Exam PHYSICAL EXAM:  General: Patient is well appearing and in no distress. Cervical spine mobility is full in all directions:   Range of Motion and Palpation Tests: Mobility is full about the elbows with flexion and extension.  No evidence of nerve subluxation at bilateral elbow.  Forearm supination and pronation are 85/85 bilaterally.  Wrist flexion/extension is 75/65 bilaterally.  Digital flexion and extension are full.  Thumb opposition is full to the base of the small fingers bilaterally.    No cords or nodules are palpated.  No triggering is observed.    Moderate tenderness over the thumb CMC articulation left side is observed.  CMC grind is positive for crepitus, mild pain left side.    Neurologic, Vascular, Motor: Sensation is slightly diminished to light touch in the bilateral median distributions.    Tinel's testing positive bilateral carpal tunnel Phalen's positive bilateral, Derkan's compression positive bilateral Thenar atrophy: Negative APB: 5/5 bilateral  Fingers pink and well perfused.  Capillary refill is brisk.     Lab Results  Component Value Date   HGBA1C 6.5 (H) 12/03/2022     Imaging: XR Wrist Complete Left  Result Date: 05/10/2023 X-rays of the left wrist, multiple views  were obtained today X-rays demonstrate significant degenerative change at the thumb basilar joint with osteophyte formation, subchondral sclerosis and joint subluxation.  There is also notable degenerative change at the thumb MP joint.   Past Medical/Family/Surgical/Social History: Medications & Allergies reviewed per EMR, new medications updated. Patient Active Problem List   Diagnosis Date Noted   Subcutaneous nodule of left thumb    Distal interphalangeal nodule 01/20/2022   Status post total left knee replacement 04/25/2021   Status post total right knee replacement 01/03/2021   Diverticular disease of colon 09/25/2020   Statin intolerance 12/27/2018   BMI 28.0-28.9,adult 11/01/2018   H/O arthroscopy of shoulder 02/10/2018   Primary osteoarthritis of left knee 11/25/2016   Primary osteoarthritis of right knee 11/25/2016   Essential hypertension 06/28/2015   H/O prostate cancer 06/28/2015   Hereditary and idiopathic peripheral neuropathy 04/17/2015   Carpal tunnel syndrome 04/17/2015   Hyperlipemia 06/02/2013   COPD with asthma 06/02/2013   Depression 06/02/2013   GERD 06/04/2009   PERCUTANEOUS TRANSLUMINAL CORONARY ANGIOPLASTY, HX OF 06/04/2009   Past Medical History:  Diagnosis Date   Arthritis    Asthma, chronic 06/02/2013   Carpal tunnel syndrome 04/17/2015   Bilateral   COPD (chronic obstructive pulmonary disease) (HCC)    Depression 06/02/2013   DYSPNEA ON EXERTION 06/04/2009   Qualifier: Diagnosis of  ByDiona Browner, MD, Hollace Hayward    Hereditary and idiopathic peripheral neuropathy 04/17/2015   Hyperlipemia 06/02/2013   Hypertension    LEG PAIN, BILATERAL 06/04/2009   Qualifier: Diagnosis of  By: Diona Browner, MD, Hollace Hayward    PERCUTANEOUS TRANSLUMINAL CORONARY ANGIOPLASTY, HX OF 06/04/2009   Qualifier: Diagnosis of  By: Diona Browner, MD, Hollace Hayward    Prostate CA [place])    and basal cell nose   Family History  Problem Relation Age of  Onset   Heart disease Other        Early Cardiac disease for uncle is their 39's as well as deceased.   Heart failure Mother    Dementia Mother 33   Cancer Father 63       testicular, lung   Esophageal cancer Brother    Past Surgical History:  Procedure Laterality Date   CARDIAC CATHETERIZATION  1997   EXCISION MASS NECK Left 11/12/2020   Procedure: EXCISION LEFT POSTERIOR NECK MASS;  Surgeon: Abigail Miyamoto, MD;  Location: [facility];  Service: General;  Laterality: Left;   EXCISION METACARPAL MASS Left 02/04/2022   Procedure: LEFT THUMB EXCISION MASS;  Surgeon: Marlyne Beards, MD;  Location: [facility];  Service: Orthopedics;  Laterality: Left;   EYE SURGERY Bilateral 2020   cataracts removed   HERNIA REPAIR     HIATAL HERNIA REPAIR     LAPAROSCOPIC GASTRIC BANDING  2000   [facility] fundoplication   MENISCUS REPAIR Left 2012   ROBOT ASSISTED LAPAROSCOPIC RADICAL PROSTATECTOMY     ROTATOR CUFF REPAIR Right 2019   TOTAL KNEE ARTHROPLASTY Right 01/03/2021   Procedure: RIGHT TOTAL KNEE ARTHROPLASTY;  Surgeon: Kathryne Hitch, MD;  Location: [facility];  Service: Orthopedics;  Laterality: Right;  Needs RNFA   TOTAL KNEE ARTHROPLASTY Left 04/25/2021   Procedure: LEFT TOTAL KNEE ARTHROPLASTY;  Surgeon: Kathryne Hitch, MD;  Location: [facility];  Service: Orthopedics;  Laterality: Left;   Social History   Occupational History   Occupation: Retired    Comment: Hotel manager  Tobacco Use   Smoking status: Former    Current packs/day: 0.00    Average packs/day: 1 pack/day for 20.0 years (20.0 ttl pk-yrs)    Types: Cigarettes    Start date: 01/18/1973    Quit date: 01/18/1993    Years since quitting: 30.3   Smokeless tobacco: Never  Vaping Use   Vaping status: Never Used  Substance and Sexual Activity   Alcohol use: Yes    Alcohol/week: 2.0 standard drinks of alcohol    Types: 2 Cans of beer per week    Comment: occasional   Drug use: No   Sexual  activity: Yes    [name] [name], M.D. [facility] 9:35 AM

## 2023-05-18 ENCOUNTER — Ambulatory Visit (INDEPENDENT_AMBULATORY_CARE_PROVIDER_SITE_OTHER): Payer: Medicare Other | Admitting: Physical Medicine and Rehabilitation

## 2023-05-18 DIAGNOSIS — M79641 Pain in right hand: Secondary | ICD-10-CM | POA: Diagnosis not present

## 2023-05-18 DIAGNOSIS — G609 Hereditary and idiopathic neuropathy, unspecified: Secondary | ICD-10-CM | POA: Diagnosis not present

## 2023-05-18 DIAGNOSIS — R202 Paresthesia of skin: Secondary | ICD-10-CM | POA: Diagnosis not present

## 2023-05-18 DIAGNOSIS — M25522 Pain in left elbow: Secondary | ICD-10-CM

## 2023-05-18 DIAGNOSIS — M79642 Pain in left hand: Secondary | ICD-10-CM | POA: Diagnosis not present

## 2023-05-18 DIAGNOSIS — M25521 Pain in right elbow: Secondary | ICD-10-CM | POA: Diagnosis not present

## 2023-05-18 NOTE — Progress Notes (Signed)
Functional Pain Scale - descriptive words and definitions  Mild (2)   Noticeable when not distracted/no impact on ADL's/sleep only slightly affected and able to   use both passive and active distraction for comfort. Mild range order  Average Pain 2  BUE NCS numbness and tingling is worse is in left hand, but has is in right hand as well. Tingling is in palm. Has difficultly grasping.

## 2023-05-19 ENCOUNTER — Encounter: Payer: Self-pay | Admitting: Physical Medicine and Rehabilitation

## 2023-05-19 NOTE — Progress Notes (Signed)
[name] [name] - 77 y.o. male MRN [id number]  Date of birth: [birth year]  Office Visit Note: Visit Date: 05/18/2023 PCP: Bennie Pierini, FNP Referred by: Samuella Cota, MD  Subjective: Chief Complaint  Patient presents with   Right Hand - Numbness, Tingling   Left Hand - Numbness, Tingling   HPI: [name] [name] is a 77 y.o. male who comes in today at the request of Dr. Bonner Puna for evaluation and management of chronic, worsening and severe pain, numbness and tingling in the Bilateral upper extremities.  Patient is Right hand dominant.  Patient is well-known to me as I have seen him over the years for various spine problems along with his wife.  He comes in today with a chronic and worsening and severe pain in both hands equally left and right somewhat more left than right with paresthesias into the whole hand globally.  He can get nocturnal complaints but he also has constant symptoms.  He has had conservative care over the years with different splinting and anti-inflammatory medicines etc.  He does endorse weakness with both hands with difficulty grasping objects and dropping things.  He feels like the symptoms started in the elbows and go down into the hands.  He has had a history of neck pain but no prior cervical surgery.  He has had multiple other orthopedic surgeries.  He is not diabetic but carries a diagnosis of hereditary and idiopathic peripheral polyneuropathy.  He saw Dr. Lesia Sago at [facility] in 2016 for electrodiagnostic study and management.  That impression is in the note below but basically showed bilateral carpal tunnel syndrome or median neuropathy at the wrist as well as lower extremity showing a polyneuropathy.  This was a 4 limb study at the time.   I spent more than 30 minutes speaking face-to-face with the patient with 50% of the time in counseling and discussing coordination of care.       Review of Systems  Musculoskeletal:   Positive for back pain, joint pain and neck pain.  Neurological:  Positive for tingling and weakness.  All other systems reviewed and are negative.  Otherwise per HPI.  Assessment & Plan: Visit Diagnoses:    ICD-10-CM   1. Paresthesia of skin  R20.2 NCV with EMG (electromyography)    2. Pain in both hands  M79.641    M79.642     3. Pain of both elbows  M25.521    M25.522     4. Hereditary and idiopathic peripheral neuropathy  G60.9        Plan: Impression: Clinical complaints consistent with likely severe median neuropathy at the wrist but cannot rule out a peripheral polyneuropathy given his history as well as pain complaints do to likely osteoarthritis.  Dr. Diagnostic study performed today.  The above electrodiagnostic study is ABNORMAL and reveals evidence of a severe bilateral median nerve entrapment at the wrist (carpal tunnel syndrome) affecting sensory and motor components.   There is no significant electrodiagnostic evidence of any other focal nerve entrapment, brachial plexopathy or cervical radiculopathy.   Recommendations: 1.  Follow-up with referring physician. 2.  Continue current management of symptoms. 3.  Suggest surgical evaluation.  Meds & Orders: No orders of the defined types were placed in this encounter.   Orders Placed This Encounter  Procedures   NCV with EMG (electromyography)    Follow-up: Return for Anshul Argarwala, MD.   Procedures: No procedures performed  EMG & NCV Findings: Evaluation of the  left median motor and the right median motor nerves showed prolonged distal onset latency (L8.0, R6.6 ms), reduced amplitude (L2.7, R4.0 mV), and decreased conduction velocity (Elbow-Wrist, L38, R40 m/s).  The left median (across palm) sensory and the right median (across palm) sensory nerves showed prolonged distal peak latency (Wrist, L6.7, R5.8 ms), reduced amplitude (L3.7, R1.6 V), and prolonged distal peak latency (Palm, L3.1, R5.3 ms).  The left  ulnar sensory nerve showed prolonged distal peak latency (3.9 ms) and decreased conduction velocity (Wrist-5th Digit, 36 m/s).  The right ulnar sensory nerve showed prolonged distal peak latency (4.0 ms), reduced amplitude (12.0 V), and decreased conduction velocity (Wrist-5th Digit, 35 m/s).  All remaining nerves (as indicated in the following tables) were within normal limits.  Left vs. Right side comparison data for the median motor nerve indicates abnormal L-R latency difference (1.4 ms).  All remaining left vs. right side differences were within normal limits.    Needle evaluation of the left abductor pollicis brevis muscle showed increased insertional activity and diminished recruitment.  All remaining muscles (as indicated in the following table) showed no evidence of electrical instability.    Impression: The above electrodiagnostic study is ABNORMAL and reveals evidence of a severe bilateral median nerve entrapment at the wrist (carpal tunnel syndrome) affecting sensory and motor components.   There is no significant electrodiagnostic evidence of any other focal nerve entrapment, brachial plexopathy or cervical radiculopathy.   Recommendations: 1.  Follow-up with referring physician. 2.  Continue current management of symptoms. 3.  Suggest surgical evaluation.  ___________________________ Naaman Plummer FAAPMR Board Certified, American Board of Physical Medicine and Rehabilitation    Nerve Conduction Studies Anti Sensory Summary Table   Stim Site NR Peak (ms) Norm Peak (ms) P-T Amp (V) Norm P-T Amp Site1 Site2 Delta-P (ms) Dist (cm) Vel (m/s) Norm Vel (m/s)  Left Median Acr Palm Anti Sensory (2nd Digit)  31C  Wrist    *6.7 <3.6 *3.7 >10 Wrist Palm 3.6 0.0    Palm    *3.1 <2.0 9.2         Right Median Acr Palm Anti Sensory (2nd Digit)  31.1C  Wrist    *5.8 <3.6 *1.6 >10 Wrist Palm 0.5 0.0    Palm    *5.3 <2.0 3.1         Left Radial Anti Sensory (Base 1st Digit)  31.1C  Wrist     2.5 <3.1 13.2  Wrist Base 1st Digit 2.5 0.0    Right Radial Anti Sensory (Base 1st Digit)  31.3C  Wrist    2.5 <3.1 7.9  Wrist Base 1st Digit 2.5 0.0    Left Ulnar Anti Sensory (5th Digit)  31.2C  Wrist    *3.9 <3.7 16.5 >15.0 Wrist 5th Digit 3.9 14.0 *36 >38  Right Ulnar Anti Sensory (5th Digit)  31.5C  Wrist    *4.0 <3.7 *12.0 >15.0 Wrist 5th Digit 4.0 14.0 *35 >38   Motor Summary Table   Stim Site NR Onset (ms) Norm Onset (ms) O-P Amp (mV) Norm O-P Amp Site1 Site2 Delta-0 (ms) Dist (cm) Vel (m/s) Norm Vel (m/s)  Left Median Motor (Abd Poll Brev)  31.1C  Wrist    *8.0 <4.2 *2.7 >5 Elbow Wrist 6.1 23.0 *38 >50  Elbow    14.1  1.4         Right Median Motor (Abd Poll Brev)  31.3C  Wrist    *6.6 <4.2 *4.0 >5 Elbow Wrist 5.7 23.0 *40 >50  Elbow    12.3  4.1         Left Ulnar Motor (Abd Dig Min)  31.4C  Wrist    3.8 <4.2 7.3 >3 B Elbow Wrist 4.1 23.0 56 >53  B Elbow    7.9  6.8  A Elbow B Elbow 1.6 10.0 63 >53  A Elbow    9.5  7.0         Right Ulnar Motor (Abd Dig Min)  31.3C  Wrist    4.1 <4.2 6.8 >3 B Elbow Wrist 4.1 22.5 55 >53  B Elbow    8.2  6.8  A Elbow B Elbow 2.1 12.0 57 >53  A Elbow    10.3  5.9          EMG   Side Muscle Nerve Root Ins Act Fibs Psw Amp Dur Poly Recrt Int Dennie Bible Comment  Left Abd Poll Brev Median C8-T1 *Incr Nml Nml Nml Nml 0 *Reduced Nml   Left 1stDorInt Ulnar C8-T1 Nml Nml Nml Nml Nml 0 Nml Nml   Left PronatorTeres Median C6-7 Nml Nml Nml Nml Nml 0 Nml Nml   Left Biceps Musculocut C5-6 Nml Nml Nml Nml Nml 0 Nml Nml     Nerve Conduction Studies Anti Sensory Left/Right Comparison   Stim Site L Lat (ms) R Lat (ms) L-R Lat (ms) L Amp (V) R Amp (V) L-R Amp (%) Site1 Site2 L Vel (m/s) R Vel (m/s) L-R Vel (m/s)  Median Acr Palm Anti Sensory (2nd Digit)  31C  Wrist *6.7 *5.8 0.9 *3.7 *1.6 56.8 Wrist Palm     Palm *3.1 *5.3 2.2 9.2 3.1 66.3       Radial Anti Sensory (Base 1st Digit)  31.1C  Wrist 2.5 2.5 0.0 13.2 7.9 40.2 Wrist Base 1st Digit      Ulnar Anti Sensory (5th Digit)  31.2C  Wrist *3.9 *4.0 0.1 16.5 *12.0 27.3 Wrist 5th Digit *36 *35 1   Motor Left/Right Comparison   Stim Site L Lat (ms) R Lat (ms) L-R Lat (ms) L Amp (mV) R Amp (mV) L-R Amp (%) Site1 Site2 L Vel (m/s) R Vel (m/s) L-R Vel (m/s)  Median Motor (Abd Poll Brev)  31.1C  Wrist *8.0 *6.6 *1.4 *2.7 *4.0 32.5 Elbow Wrist *38 *40 2  Elbow 14.1 12.3 1.8 1.4 4.1 65.9       Ulnar Motor (Abd Dig Min)  31.4C  Wrist 3.8 4.1 0.3 7.3 6.8 6.8 B Elbow Wrist 56 55 1  B Elbow 7.9 8.2 0.3 6.8 6.8 0.0 A Elbow B Elbow 63 57 6  A Elbow 9.5 10.3 0.8 7.0 5.9 15.7          Waveforms:                      Clinical History: 04/17/2015 EMG/NCS IMPRESSION:   Nerve conduction studies done on all 4 extremities shows evidence of mild bilateral carpal tunnel syndrome. There is evidence of motor and sensory impairment on nerve conduction studies on both lower extremities consistent with the diagnosis of peripheral neuropathy. EMG evaluation of the left lower extremity does show mild distal chronic denervation consistent with the diagnosis of peripheral neuropathy. No clear evidence of an overlying lumbosacral radiculopathy is seen.   Marlan Palau MD 04/17/2015 11:26 AM   [facility] [address] [place], [place] [address]   He reports that he quit smoking about 30 years ago. His smoking use included  cigarettes. He started smoking about 50 years ago. He has a 20 pack-year smoking history. He has never used smokeless tobacco.  Recent Labs    06/04/22 1357 12/03/22 1408  HGBA1C 5.7* 6.5*    Objective:  VS:  HT:    WT:   BMI:     BP:   HR: bpm  TEMP: ( )  RESP:  Physical Exam Vitals and nursing note reviewed.  Constitutional:      General: He is not in acute distress.    Appearance: Normal appearance. He is well-developed.  HENT:     Head: Normocephalic and atraumatic.  Eyes:     Conjunctiva/sclera: Conjunctivae normal.      Pupils: Pupils are equal, round, and reactive to light.  Cardiovascular:     Rate and Rhythm: Normal rate.     Pulses: Normal pulses.     Heart sounds: Normal heart sounds.  Pulmonary:     Effort: Pulmonary effort is normal. No respiratory distress.  Musculoskeletal:        General: No tenderness.     Cervical back: Normal range of motion and neck supple. No rigidity.     Right lower leg: No edema.     Left lower leg: No edema.     Comments: Inspection reveals flattening of the bilateral APB but no atrophy of the bilateral APB or FDI or hand intrinsics. There is no swelling, color changes, allodynia or dystrophic changes. There is 5 out of 5 strength in the bilateral wrist extension, finger abduction and long finger flexion.  There is decreased sensation in the bilateral median nerve distributions. There is a negative Tinel's test at the bilateral wrist and elbow. There is a positive Phalen's test bilaterally. There is a negative Hoffmann's test bilaterally.  Skin:    General: Skin is warm and dry.     Findings: No erythema or rash.  Neurological:     General: No focal deficit present.     Mental Status: He is alert and oriented to person, place, and time.     Sensory: No sensory deficit.     Motor: No weakness or abnormal muscle tone.     Coordination: Coordination normal.     Gait: Gait normal.  Psychiatric:        Mood and Affect: Mood normal.        Behavior: Behavior normal.        Thought Content: Thought content normal.     Ortho Exam  Imaging: No results found.  Past Medical/Family/Surgical/Social History: Medications & Allergies reviewed per EMR, new medications updated. Patient Active Problem List   Diagnosis Date Noted   Subcutaneous nodule of left thumb    Distal interphalangeal nodule 01/20/2022   Status post total left knee replacement 04/25/2021   Status post total right knee replacement 01/03/2021   Diverticular disease of colon 09/25/2020   Statin  intolerance 12/27/2018   BMI 28.0-28.9,adult 11/01/2018   H/O arthroscopy of shoulder 02/10/2018   Primary osteoarthritis of left knee 11/25/2016   Primary osteoarthritis of right knee 11/25/2016   Essential hypertension 06/28/2015   H/O prostate cancer 06/28/2015   Hereditary and idiopathic peripheral neuropathy 04/17/2015   Carpal tunnel syndrome 04/17/2015   Hyperlipemia 06/02/2013   COPD with asthma 06/02/2013   Depression 06/02/2013   GERD 06/04/2009   PERCUTANEOUS TRANSLUMINAL CORONARY ANGIOPLASTY, HX OF 06/04/2009   Past Medical History:  Diagnosis Date   Arthritis    Asthma, chronic 06/02/2013   Carpal tunnel syndrome  04/17/2015   Bilateral   COPD (chronic obstructive pulmonary disease) (HCC)    Depression 06/02/2013   DYSPNEA ON EXERTION 06/04/2009   Qualifier: Diagnosis of  By: Diona Browner, MD, Hollace Hayward    Hereditary and idiopathic peripheral neuropathy 04/17/2015   Hyperlipemia 06/02/2013   Hypertension    LEG PAIN, BILATERAL 06/04/2009   Qualifier: Diagnosis of  By: Diona Browner, MD, Hollace Hayward    PERCUTANEOUS TRANSLUMINAL CORONARY ANGIOPLASTY, HX OF 06/04/2009   Qualifier: Diagnosis of  By: Diona Browner, MD, Hollace Hayward    Prostate CA [facility])    and basal cell nose   Family History  Problem Relation Age of Onset   Heart disease Other        Early Cardiac disease for uncle is their 67's as well as deceased.   Heart failure Mother    Dementia Mother 17   Cancer Father 49       testicular, lung   Esophageal cancer Brother    Past Surgical History:  Procedure Laterality Date   CARDIAC CATHETERIZATION  1997   EXCISION MASS NECK Left 11/12/2020   Procedure: EXCISION LEFT POSTERIOR NECK MASS;  Surgeon: Abigail Miyamoto, MD;  Location: [facility];  Service: General;  Laterality: Left;   EXCISION METACARPAL MASS Left 02/04/2022   Procedure: LEFT THUMB EXCISION MASS;  Surgeon: Marlyne Beards, MD;  Location: [facility];  Service: Orthopedics;  Laterality: Left;   EYE SURGERY Bilateral 2020   cataracts removed   HERNIA REPAIR     HIATAL HERNIA REPAIR     LAPAROSCOPIC GASTRIC BANDING  2000   [facility] fundoplication   MENISCUS REPAIR Left 2012   ROBOT ASSISTED LAPAROSCOPIC RADICAL PROSTATECTOMY     ROTATOR CUFF REPAIR Right 2019   TOTAL KNEE ARTHROPLASTY Right 01/03/2021   Procedure: RIGHT TOTAL KNEE ARTHROPLASTY;  Surgeon: Kathryne Hitch, MD;  Location: [facility];  Service: Orthopedics;  Laterality: Right;  Needs RNFA   TOTAL KNEE ARTHROPLASTY Left 04/25/2021   Procedure: LEFT TOTAL KNEE ARTHROPLASTY;  Surgeon: Kathryne Hitch, MD;  Location: [facility];  Service: Orthopedics;  Laterality: Left;   Social History   Occupational History   Occupation: Retired    Comment: Hotel manager  Tobacco Use   Smoking status: Former    Current packs/day: 0.00    Average packs/day: 1 pack/day for 20.0 years (20.0 ttl pk-yrs)    Types: Cigarettes    Start date: 01/18/1973    Quit date: 01/18/1993    Years since quitting: 30.3   Smokeless tobacco: Never  Vaping Use   Vaping status: Never Used  Substance and Sexual Activity   Alcohol use: Yes    Alcohol/week: 2.0 standard drinks of alcohol    Types: 2 Cans of beer per week    Comment: occasional   Drug use: No   Sexual activity: Yes

## 2023-05-19 NOTE — Procedures (Signed)
EMG & NCV Findings: Evaluation of the left median motor and the right median motor nerves showed prolonged distal onset latency (L8.0, R6.6 ms), reduced amplitude (L2.7, R4.0 mV), and decreased conduction velocity (Elbow-Wrist, L38, R40 m/s).  The left median (across palm) sensory and the right median (across palm) sensory nerves showed prolonged distal peak latency (Wrist, L6.7, R5.8 ms), reduced amplitude (L3.7, R1.6 V), and prolonged distal peak latency (Palm, L3.1, R5.3 ms).  The left ulnar sensory nerve showed prolonged distal peak latency (3.9 ms) and decreased conduction velocity (Wrist-5th Digit, 36 m/s).  The right ulnar sensory nerve showed prolonged distal peak latency (4.0 ms), reduced amplitude (12.0 V), and decreased conduction velocity (Wrist-5th Digit, 35 m/s).  All remaining nerves (as indicated in the following tables) were within normal limits.  Left vs. Right side comparison data for the median motor nerve indicates abnormal L-R latency difference (1.4 ms).  All remaining left vs. right side differences were within normal limits.    Needle evaluation of the left abductor pollicis brevis muscle showed increased insertional activity and diminished recruitment.  All remaining muscles (as indicated in the following table) showed no evidence of electrical instability.    Impression: The above electrodiagnostic study is ABNORMAL and reveals evidence of a severe bilateral median nerve entrapment at the wrist (carpal tunnel syndrome) affecting sensory and motor components.   There is no significant electrodiagnostic evidence of any other focal nerve entrapment, brachial plexopathy or cervical radiculopathy.   Recommendations: 1.  Follow-up with referring physician. 2.  Continue current management of symptoms. 3.  Suggest surgical evaluation.  ___________________________ Naaman Plummer FAAPMR Board Certified, American Board of Physical Medicine and Rehabilitation    Nerve Conduction  Studies Anti Sensory Summary Table   Stim Site NR Peak (ms) Norm Peak (ms) P-T Amp (V) Norm P-T Amp Site1 Site2 Delta-P (ms) Dist (cm) Vel (m/s) Norm Vel (m/s)  Left Median Acr Palm Anti Sensory (2nd Digit)  31C  Wrist    *6.7 <3.6 *3.7 >10 Wrist Palm 3.6 0.0    Palm    *3.1 <2.0 9.2         Right Median Acr Palm Anti Sensory (2nd Digit)  31.1C  Wrist    *5.8 <3.6 *1.6 >10 Wrist Palm 0.5 0.0    Palm    *5.3 <2.0 3.1         Left Radial Anti Sensory (Base 1st Digit)  31.1C  Wrist    2.5 <3.1 13.2  Wrist Base 1st Digit 2.5 0.0    Right Radial Anti Sensory (Base 1st Digit)  31.3C  Wrist    2.5 <3.1 7.9  Wrist Base 1st Digit 2.5 0.0    Left Ulnar Anti Sensory (5th Digit)  31.2C  Wrist    *3.9 <3.7 16.5 >15.0 Wrist 5th Digit 3.9 14.0 *36 >38  Right Ulnar Anti Sensory (5th Digit)  31.5C  Wrist    *4.0 <3.7 *12.0 >15.0 Wrist 5th Digit 4.0 14.0 *35 >38   Motor Summary Table   Stim Site NR Onset (ms) Norm Onset (ms) O-P Amp (mV) Norm O-P Amp Site1 Site2 Delta-0 (ms) Dist (cm) Vel (m/s) Norm Vel (m/s)  Left Median Motor (Abd Poll Brev)  31.1C  Wrist    *8.0 <4.2 *2.7 >5 Elbow Wrist 6.1 23.0 *38 >50  Elbow    14.1  1.4         Right Median Motor (Abd Poll Brev)  31.3C  Wrist    *6.6 <4.2 *4.0 >5 Elbow  Wrist 5.7 23.0 *40 >50  Elbow    12.3  4.1         Left Ulnar Motor (Abd Dig Min)  31.4C  Wrist    3.8 <4.2 7.3 >3 B Elbow Wrist 4.1 23.0 56 >53  B Elbow    7.9  6.8  A Elbow B Elbow 1.6 10.0 63 >53  A Elbow    9.5  7.0         Right Ulnar Motor (Abd Dig Min)  31.3C  Wrist    4.1 <4.2 6.8 >3 B Elbow Wrist 4.1 22.5 55 >53  B Elbow    8.2  6.8  A Elbow B Elbow 2.1 12.0 57 >53  A Elbow    10.3  5.9          EMG   Side Muscle Nerve Root Ins Act Fibs Psw Amp Dur Poly Recrt Int Dennie Bible Comment  Left Abd Poll Brev Median C8-T1 *Incr Nml Nml Nml Nml 0 *Reduced Nml   Left 1stDorInt Ulnar C8-T1 Nml Nml Nml Nml Nml 0 Nml Nml   Left PronatorTeres Median C6-7 Nml Nml Nml Nml Nml 0 Nml Nml    Left Biceps Musculocut C5-6 Nml Nml Nml Nml Nml 0 Nml Nml     Nerve Conduction Studies Anti Sensory Left/Right Comparison   Stim Site L Lat (ms) R Lat (ms) L-R Lat (ms) L Amp (V) R Amp (V) L-R Amp (%) Site1 Site2 L Vel (m/s) R Vel (m/s) L-R Vel (m/s)  Median Acr Palm Anti Sensory (2nd Digit)  31C  Wrist *6.7 *5.8 0.9 *3.7 *1.6 56.8 Wrist Palm     Palm *3.1 *5.3 2.2 9.2 3.1 66.3       Radial Anti Sensory (Base 1st Digit)  31.1C  Wrist 2.5 2.5 0.0 13.2 7.9 40.2 Wrist Base 1st Digit     Ulnar Anti Sensory (5th Digit)  31.2C  Wrist *3.9 *4.0 0.1 16.5 *12.0 27.3 Wrist 5th Digit *36 *35 1   Motor Left/Right Comparison   Stim Site L Lat (ms) R Lat (ms) L-R Lat (ms) L Amp (mV) R Amp (mV) L-R Amp (%) Site1 Site2 L Vel (m/s) R Vel (m/s) L-R Vel (m/s)  Median Motor (Abd Poll Brev)  31.1C  Wrist *8.0 *6.6 *1.4 *2.7 *4.0 32.5 Elbow Wrist *38 *40 2  Elbow 14.1 12.3 1.8 1.4 4.1 65.9       Ulnar Motor (Abd Dig Min)  31.4C  Wrist 3.8 4.1 0.3 7.3 6.8 6.8 B Elbow Wrist 56 55 1  B Elbow 7.9 8.2 0.3 6.8 6.8 0.0 A Elbow B Elbow 63 57 6  A Elbow 9.5 10.3 0.8 7.0 5.9 15.7          Waveforms:

## 2023-05-21 ENCOUNTER — Telehealth: Payer: Self-pay | Admitting: Orthopaedic Surgery

## 2023-05-21 ENCOUNTER — Telehealth: Payer: Self-pay | Admitting: Orthopedic Surgery

## 2023-05-21 ENCOUNTER — Ambulatory Visit (INDEPENDENT_AMBULATORY_CARE_PROVIDER_SITE_OTHER): Payer: Medicare Other | Admitting: Orthopedic Surgery

## 2023-05-21 DIAGNOSIS — G5603 Carpal tunnel syndrome, bilateral upper limbs: Secondary | ICD-10-CM

## 2023-05-21 NOTE — Telephone Encounter (Signed)
Pt called stating missed a call for scheduling surgery with Dr. Fara Boros. Pt stated to give them a call back please. [name] [name] [phone]

## 2023-05-21 NOTE — Progress Notes (Signed)
[name] [name] - 77 y.o. male MRN [id number]  Date of birth: 01/03/1946  Office Visit Note: Visit Date: 05/21/2023 PCP: Bennie Pierini, FNP Referred by: Daphine Deutscher, Mary-Margaret, *  Subjective: Chief Complaint  Patient presents with   Right Hand - Numbness   Left Hand - Numbness   HPI: [name] [name] is a pleasant 77 y.o. male who presents today for follow-up of ongoing numbness and tingling in bilateral hands, left greater than right.  States this been present for multiple years, worsening in nature.  Also does have nocturnal symptoms on a regular basis.  Has not undergone prior workup or treatments.  Underwent recent EMG study which showed severe bilateral median nerve entrapment at the wrist consistent with his clinical examination.  Pertinent ROS were reviewed with the patient and found to be negative unless otherwise specified above in HPI.   Visit Reason: Bilateral hands, numbness and tingling Hand dominance: right Occupation: retired Diabetic: No Heart/Lung History: COPD Blood Thinners:  baby aspirin  Prior Testing/EMG: none Injections (Date): none Treatments: none Prior Surgery: none   Assessment & Plan: Visit Diagnoses:  No diagnosis found.   Plan: Extensive discussion was had the patient today regarding his bilateral carpal tunnel syndrome.  He demonstrates clinical and electrodiagnostic evidence of severe bilateral carpal tunnel syndrome, he is indicated for bilateral, staged open versus endoscopic carpal tunnel release.    Risks and benefits of both operations were discussed in detail today.  Understanding all risks and benefits, patient would like to have surgery done in the form of left open carpal tunnel release under local anesthesia to begin, once he is fully recovered from the left side we can schedule him for the right to be done in the same fashion.  Risks include but not limited to infection, bleeding, scarring, stiffness, nerve injury or  vascular, tendon injury, risk of recurrence and need for subsequent operation were all discussed in detail.  Patient consented understanding the above.  Will move forward with surgical scheduling of left open carpal tunnel release under local anesthesia.   Follow-up: No follow-ups on file.   Meds & Orders: No orders of the defined types were placed in this encounter.   No orders of the defined types were placed in this encounter.    Procedures: No procedures performed      Clinical History:   He reports that he quit smoking about 30 years ago. His smoking use included cigarettes. He started smoking about 50 years ago. He has a 20 pack-year smoking history. He has never used smokeless tobacco.  Recent Labs    06/04/22 1357 12/03/22 1408  HGBA1C 5.7* 6.5*    Objective:   Vital Signs: There were no vitals taken for this visit.  Physical Exam  Gen: Well-appearing, in no acute distress; non-toxic CV: Regular Rate. Well-perfused. Warm.  Resp: Breathing unlabored on room air; no wheezing. Psych: Fluid speech in conversation; appropriate affect; normal thought process  Ortho Exam PHYSICAL EXAM:  General: Patient is well appearing and in no distress. Cervical spine mobility is full in all directions:   Range of Motion and Palpation Tests: Mobility is full about the elbows with flexion and extension.  No evidence of nerve subluxation at bilateral elbow.  Forearm supination and pronation are 85/85 bilaterally.  Wrist flexion/extension is 75/65 bilaterally.  Digital flexion and extension are full.  Thumb opposition is full to the base of the small fingers bilaterally.    No cords or nodules are  palpated.  No triggering is observed.    Moderate tenderness over the thumb CMC articulation left side is observed.  CMC grind is positive for crepitus, mild pain left side.    Neurologic, Vascular, Motor: Sensation is diminished to light touch in the bilateral median distributions.     Tinel's testing positive bilateral carpal tunnel Phalen's positive bilateral, Derkan's compression positive bilateral Thenar atrophy: Mild left APB: 5/5 bilateral  Fingers pink and well perfused.  Capillary refill is brisk.     Lab Results  Component Value Date   HGBA1C 6.5 (H) 12/03/2022     Imaging: No results found.  Past Medical/Family/Surgical/Social History: Medications & Allergies reviewed per EMR, new medications updated. Patient Active Problem List   Diagnosis Date Noted   Subcutaneous nodule of left thumb    Distal interphalangeal nodule 01/20/2022   Status post total left knee replacement 04/25/2021   Status post total right knee replacement 01/03/2021   Diverticular disease of colon 09/25/2020   Statin intolerance 12/27/2018   BMI 28.0-28.9,adult 11/01/2018   H/O arthroscopy of shoulder 02/10/2018   Primary osteoarthritis of left knee 11/25/2016   Primary osteoarthritis of right knee 11/25/2016   Essential hypertension 06/28/2015   H/O prostate cancer 06/28/2015   Hereditary and idiopathic peripheral neuropathy 04/17/2015   Carpal tunnel syndrome 04/17/2015   Hyperlipemia 06/02/2013   COPD with asthma 06/02/2013   Depression 06/02/2013   GERD 06/04/2009   PERCUTANEOUS TRANSLUMINAL CORONARY ANGIOPLASTY, HX OF 06/04/2009   Past Medical History:  Diagnosis Date   Arthritis    Asthma, chronic 06/02/2013   Carpal tunnel syndrome 04/17/2015   Bilateral   COPD (chronic obstructive pulmonary disease) (HCC)    Depression 06/02/2013   DYSPNEA ON EXERTION 06/04/2009   Qualifier: Diagnosis of  By: Diona Browner, MD, Hollace Hayward    Hereditary and idiopathic peripheral neuropathy 04/17/2015   Hyperlipemia 06/02/2013   Hypertension    LEG PAIN, BILATERAL 06/04/2009   Qualifier: Diagnosis of  By: Diona Browner, MD, Hollace Hayward    PERCUTANEOUS TRANSLUMINAL CORONARY ANGIOPLASTY, HX OF 06/04/2009   Qualifier: Diagnosis of  By: Diona Browner, MD, Hollace Hayward    Prostate CA [facility])    and basal cell nose   Family History  Problem Relation Age of Onset   Heart disease Other        Early Cardiac disease for uncle is their 43's as well as deceased.   Heart failure Mother    Dementia Mother 17   Cancer Father 36       testicular, lung   Esophageal cancer Brother    Past Surgical History:  Procedure Laterality Date   CARDIAC CATHETERIZATION  1997   EXCISION MASS NECK Left 11/12/2020   Procedure: EXCISION LEFT POSTERIOR NECK MASS;  Surgeon: Abigail Miyamoto, MD;  Location: [facility];  Service: General;  Laterality: Left;   EXCISION METACARPAL MASS Left 02/04/2022   Procedure: LEFT THUMB EXCISION MASS;  Surgeon: Marlyne Beards, MD;  Location: [facility];  Service: Orthopedics;  Laterality: Left;   EYE SURGERY Bilateral 2020   cataracts removed   HERNIA REPAIR     HIATAL HERNIA REPAIR     LAPAROSCOPIC GASTRIC BANDING  2000   [facility] fundoplication   MENISCUS REPAIR Left 2012   ROBOT ASSISTED LAPAROSCOPIC RADICAL PROSTATECTOMY     ROTATOR CUFF REPAIR Right 2019   TOTAL KNEE ARTHROPLASTY Right 01/03/2021   Procedure: RIGHT TOTAL KNEE ARTHROPLASTY;  Surgeon: Kathryne Hitch, MD;  Location: [facility];  Service: Orthopedics;  Laterality: Right;  Needs RNFA   TOTAL KNEE ARTHROPLASTY Left 04/25/2021   Procedure: LEFT TOTAL KNEE ARTHROPLASTY;  Surgeon: Kathryne Hitch, MD;  Location: [facility];  Service: Orthopedics;  Laterality: Left;   Social History   Occupational History   Occupation: Retired    Comment: Hotel manager  Tobacco Use   Smoking status: Former    Current packs/day: 0.00    Average packs/day: 1 pack/day for 20.0 years (20.0 ttl pk-yrs)    Types: Cigarettes    Start date: 01/18/1973    Quit date: 01/18/1993    Years since quitting: 30.3   Smokeless tobacco: Never  Vaping Use   Vaping status: Never Used  Substance and Sexual Activity   Alcohol use: Yes    Alcohol/week: 2.0 standard  drinks of alcohol    Types: 2 Cans of beer per week    Comment: occasional   Drug use: No   Sexual activity: Yes    Minnie Shi Fara Boros) Denese Killings, M.D. [facility] 8:48 AM

## 2023-05-26 ENCOUNTER — Other Ambulatory Visit: Payer: Self-pay | Admitting: Orthopedic Surgery

## 2023-05-26 DIAGNOSIS — Z9889 Other specified postprocedural states: Secondary | ICD-10-CM

## 2023-05-26 HISTORY — PX: CARPAL TUNNEL RELEASE: SHX101

## 2023-05-26 NOTE — Telephone Encounter (Signed)
I spoke with the patient on 05/24/23, and scheduled his surgery for 05/27/23.

## 2023-05-27 DIAGNOSIS — G5602 Carpal tunnel syndrome, left upper limb: Secondary | ICD-10-CM | POA: Diagnosis not present

## 2023-06-07 ENCOUNTER — Encounter: Payer: Self-pay | Admitting: Nurse Practitioner

## 2023-06-07 ENCOUNTER — Ambulatory Visit: Payer: Medicare Other | Admitting: Nurse Practitioner

## 2023-06-07 VITALS — BP 123/70 | HR 59 | Temp 98.1°F | Ht 74.0 in | Wt 258.0 lb

## 2023-06-07 DIAGNOSIS — J4520 Mild intermittent asthma, uncomplicated: Secondary | ICD-10-CM

## 2023-06-07 DIAGNOSIS — I10 Essential (primary) hypertension: Secondary | ICD-10-CM

## 2023-06-07 DIAGNOSIS — K219 Gastro-esophageal reflux disease without esophagitis: Secondary | ICD-10-CM | POA: Diagnosis not present

## 2023-06-07 DIAGNOSIS — R739 Hyperglycemia, unspecified: Secondary | ICD-10-CM | POA: Diagnosis not present

## 2023-06-07 DIAGNOSIS — Z23 Encounter for immunization: Secondary | ICD-10-CM | POA: Diagnosis not present

## 2023-06-07 DIAGNOSIS — E782 Mixed hyperlipidemia: Secondary | ICD-10-CM | POA: Diagnosis not present

## 2023-06-07 DIAGNOSIS — G609 Hereditary and idiopathic neuropathy, unspecified: Secondary | ICD-10-CM

## 2023-06-07 DIAGNOSIS — J4489 Other specified chronic obstructive pulmonary disease: Secondary | ICD-10-CM

## 2023-06-07 DIAGNOSIS — K5730 Diverticulosis of large intestine without perforation or abscess without bleeding: Secondary | ICD-10-CM | POA: Diagnosis not present

## 2023-06-07 DIAGNOSIS — Z6828 Body mass index (BMI) 28.0-28.9, adult: Secondary | ICD-10-CM | POA: Diagnosis not present

## 2023-06-07 DIAGNOSIS — F3342 Major depressive disorder, recurrent, in full remission: Secondary | ICD-10-CM

## 2023-06-07 LAB — BAYER DCA HB A1C WAIVED: HB A1C (BAYER DCA - WAIVED): 6.3 % — ABNORMAL HIGH (ref 4.8–5.6)

## 2023-06-07 MED ORDER — MONTELUKAST SODIUM 10 MG PO TABS
10.0000 mg | ORAL_TABLET | Freq: Every day | ORAL | 1 refills | Status: DC
Start: 2023-06-07 — End: 2023-12-02

## 2023-06-07 MED ORDER — AMLODIPINE BESYLATE 5 MG PO TABS
5.0000 mg | ORAL_TABLET | Freq: Every day | ORAL | 1 refills | Status: DC
Start: 2023-06-07 — End: 2023-08-26

## 2023-06-07 MED ORDER — CITALOPRAM HYDROBROMIDE 20 MG PO TABS
20.0000 mg | ORAL_TABLET | Freq: Every day | ORAL | 1 refills | Status: DC
Start: 2023-06-07 — End: 2023-12-02

## 2023-06-07 MED ORDER — FLUTICASONE-SALMETEROL 230-21 MCG/ACT IN AERO
2.0000 | INHALATION_SPRAY | Freq: Two times a day (BID) | RESPIRATORY_TRACT | 5 refills | Status: DC
Start: 2023-06-07 — End: 2023-08-26

## 2023-06-07 MED ORDER — PRAVASTATIN SODIUM 20 MG PO TABS
10.0000 mg | ORAL_TABLET | Freq: Every day | ORAL | 1 refills | Status: DC
Start: 2023-06-07 — End: 2023-12-02

## 2023-06-07 MED ORDER — ATENOLOL 25 MG PO TABS
25.0000 mg | ORAL_TABLET | Freq: Every day | ORAL | 1 refills | Status: DC
Start: 2023-06-07 — End: 2023-08-26

## 2023-06-07 MED ORDER — EZETIMIBE 10 MG PO TABS
10.0000 mg | ORAL_TABLET | Freq: Every day | ORAL | 1 refills | Status: DC
Start: 2023-06-07 — End: 2023-12-02

## 2023-06-07 NOTE — Progress Notes (Signed)
Subjective:    Patient ID: [name] [name], male    DOB: 1945-12-04, 77 y.o.   MRN: [id number]   Chief Complaint: medical management of chronic issues     HPI:  [name] [name] is a 77 y.o. who identifies as a male who was assigned male at birth.   Social history: Lives with: wife Work history: retired   Water engineer in today for follow up of the following chronic medical issues:  1. Essential hypertension No c/o chest pain, sob or headache. Does not check blood pressure at home. BP Readings from Last 3 Encounters:  03/11/23 130/68  03/01/23 129/76  02/24/23 139/68     2. Mixed hyperlipidemia Doe snot really watch diet and does no dedicated exercise. Pravastatin isthe only med he can tolertae. Lab Results  Component Value Date   CHOL 156 12/03/2022   HDL 49 12/03/2022   LDLCALC 83 12/03/2022   LDLDIRECT 92.8 08/28/2014   TRIG 140 12/03/2022   CHOLHDL 3.2 12/03/2022     3. COPD with asthma Is currently on symbicort daily. Takes singulair daily. Denies chronic cough.  4. Diverticular disease of colon No recent flare ups since last visit  5. Gastroesophageal reflux disease, unspecified whether esophagitis present Uses OTC meds as needed  6. Hereditary and idiopathic peripheral neuropathy Burning sensation bil feet  7. Recurrent major depressive disorder, in full remission (HCC) Is on celexa and is doing well.    06/07/2023    1:53 PM 12/03/2022    2:08 PM 10/28/2022    3:16 PM  Depression screen PHQ 2/9  Decreased Interest 0 0 0  Down, Depressed, Hopeless 0 0 0  PHQ - 2 Score 0 0 0  Altered sleeping 0 1   Tired, decreased energy 0 0   Change in appetite 0 0   Feeling bad or failure about yourself  0 0   Trouble concentrating 0 0   Moving slowly or fidgety/restless 0 0   Suicidal thoughts 0 0   PHQ-9 Score 0 1   Difficult doing work/chores Not difficult at all Not difficult at all      8. BMI 28.0-28.9,adult No recent weight changes Wt Readings from  Last 3 Encounters:  06/07/23 258 lb (117 kg)  03/11/23 255 lb (115.7 kg)  03/01/23 255 lb (115.7 kg)   BMI Readings from Last 3 Encounters:  06/07/23 33.13 kg/m  03/11/23 32.74 kg/m  03/01/23 32.74 kg/m      New complaints: None today  Allergies  Allergen Reactions   Fenofibrate Other (See Comments)    Pt reports causes joint pains   Rosuvastatin Other (See Comments)    Pt reports causes joint pains   Outpatient Encounter Medications as of 06/07/2023  Medication Sig   amLODipine (NORVASC) 5 MG tablet Take 1 tablet (5 mg total) by mouth daily.   aspirin 81 MG chewable tablet Chew 1 tablet (81 mg total) by mouth 2 (two) times daily.   atenolol (TENORMIN) 25 MG tablet Take 1 tablet (25 mg total) by mouth daily.   Cholecalciferol (VITAMIN D3) 25 MCG (1000 UT) CAPS Take 1,000 Units by mouth daily.   citalopram (CELEXA) 20 MG tablet Take 1 tablet (20 mg total) by mouth daily.   cyanocobalamin (VITAMIN B12) 1000 MCG/ML injection Inject 1 mL (1,000 mcg total) into the muscle every 30 (thirty) days.   ezetimibe (ZETIA) 10 MG tablet Take 1 tablet (10 mg total) by mouth daily.   fluticasone-salmeterol (ADVAIR HFA) 230-21 MCG/ACT inhaler  Inhale 2 puffs into the lungs 2 (two) times daily.   montelukast (SINGULAIR) 10 MG tablet Take 1 tablet (10 mg total) by mouth daily.   pravastatin (PRAVACHOL) 20 MG tablet Take 0.5 tablets (10 mg total) by mouth daily.   sildenafil (REVATIO) 20 MG tablet Take 20 mg by mouth as needed.   No facility-administered encounter medications on file as of 06/07/2023.    Past Surgical History:  Procedure Laterality Date   CARDIAC CATHETERIZATION  1997   EXCISION MASS NECK Left 11/12/2020   Procedure: EXCISION LEFT POSTERIOR NECK MASS;  Surgeon: Abigail Miyamoto, MD;  Location: [facility];  Service: General;  Laterality: Left;   EXCISION METACARPAL MASS Left 02/04/2022   Procedure: LEFT THUMB EXCISION MASS;  Surgeon: Marlyne Beards, MD;   Location: [facility];  Service: Orthopedics;  Laterality: Left;   EYE SURGERY Bilateral 2020   cataracts removed   HERNIA REPAIR     HIATAL HERNIA REPAIR     LAPAROSCOPIC GASTRIC BANDING  2000   [facility] fundoplication   MENISCUS REPAIR Left 2012   ROBOT ASSISTED LAPAROSCOPIC RADICAL PROSTATECTOMY     ROTATOR CUFF REPAIR Right 2019   TOTAL KNEE ARTHROPLASTY Right 01/03/2021   Procedure: RIGHT TOTAL KNEE ARTHROPLASTY;  Surgeon: Kathryne Hitch, MD;  Location: [facility];  Service: Orthopedics;  Laterality: Right;  Needs RNFA   TOTAL KNEE ARTHROPLASTY Left 04/25/2021   Procedure: LEFT TOTAL KNEE ARTHROPLASTY;  Surgeon: Kathryne Hitch, MD;  Location: [facility];  Service: Orthopedics;  Laterality: Left;    Family History  Problem Relation Age of Onset   Heart disease Other        Early Cardiac disease for uncle is their 21's as well as deceased.   Heart failure Mother    Dementia Mother 37   Cancer Father 3       testicular, lung   Esophageal cancer Brother       Controlled substance contract: n/a     Review of Systems  Constitutional:  Negative for diaphoresis.  Eyes:  Negative for pain.  Respiratory:  Negative for shortness of breath.   Cardiovascular:  Negative for chest pain, palpitations and leg swelling.  Gastrointestinal:  Negative for abdominal pain.  Endocrine: Negative for polydipsia.  Skin:  Negative for rash.  Neurological:  Negative for dizziness, weakness and headaches.  Hematological:  Does not bruise/bleed easily.  All other systems reviewed and are negative.      Objective:   Physical Exam Vitals and nursing note reviewed.  Constitutional:      Appearance: Normal appearance. He is well-developed.  HENT:     Head: Normocephalic.     Nose: Nose normal.     Mouth/Throat:     Mouth: Mucous membranes are moist.     Pharynx: Oropharynx is clear.  Eyes:     Pupils: Pupils are equal, round, and reactive to light.  Neck:      Thyroid: No thyroid mass or thyromegaly.     Vascular: No carotid bruit or JVD.     Trachea: Phonation normal.  Cardiovascular:     Rate and Rhythm: Normal rate and regular rhythm.  Pulmonary:     Effort: Pulmonary effort is normal. No respiratory distress.     Breath sounds: Normal breath sounds.  Abdominal:     General: Bowel sounds are normal.     Palpations: Abdomen is soft.     Tenderness: There is no abdominal tenderness.  Musculoskeletal:  General: Normal range of motion.     Cervical back: Normal range of motion and neck supple.  Lymphadenopathy:     Cervical: No cervical adenopathy.  Skin:    General: Skin is warm and dry.  Neurological:     Mental Status: He is alert and oriented to person, place, and time.  Psychiatric:        Behavior: Behavior normal.        Thought Content: Thought content normal.        Judgment: Judgment normal.     BP 123/70   Pulse (!) 59   Temp 98.1 F (36.7 C) (Temporal)   Ht 6\' 2"  (1.88 m)   Wt 258 lb (117 kg)   SpO2 95%   BMI 33.13 kg/m   Hgba1c 6.3%     Assessment & Plan:   [name] [name] comes in today with chief complaint of Medical Management of Chronic Issues   Diagnosis and orders addressed:  1. Essential hypertension Low sodium diet - CBC with Differential/Platelet - CMP14+EGFR - amLODipine (NORVASC) 5 MG tablet; Take 1 tablet (5 mg total) by mouth daily.  Dispense: 90 tablet; Refill: 1 - atenolol (TENORMIN) 25 MG tablet; Take 1 tablet (25 mg total) by mouth daily.  Dispense: 90 tablet; Refill: 1  2. Mixed hyperlipidemia Low fat diet - Lipid panel - ezetimibe (ZETIA) 10 MG tablet; Take 1 tablet (10 mg total) by mouth daily.  Dispense: 90 tablet; Refill: 1 - pravastatin (PRAVACHOL) 20 MG tablet; Take 0.5 tablets (10 mg total) by mouth daily.  Dispense: 90 tablet; Refill: 1  3. COPD with asthma Keep follow up with pulmonology - fluticasone-salmeterol (ADVAIR HFA) 230-21 MCG/ACT inhaler; Inhale 2 puffs  into the lungs 2 (two) times daily.  Dispense: 1 each; Refill: 5  4. Diverticular disease of colon Watch diet to prevent flare up  5. Gastroesophageal reflux disease, unspecified whether esophagitis present Avoid spicy foods Do not eat 2 hours prior to bedtime   6. Hereditary and idiopathic peripheral neuropathy Check feet daily  7. Recurrent major depressive disorder, in full remission (HCC) Stress management - citalopram (CELEXA) 20 MG tablet; Take 1 tablet (20 mg total) by mouth daily.  Dispense: 90 tablet; Refill: 1  8. BMI 28.0-28.9,adult Discussed diet and exercise for person with BMI >25 Will recheck weight in 3-6 months   9. Elevated blood sugar - Bayer DCA Hb A1c Waived  10. Mild intermittent chronic asthma without complication - montelukast (SINGULAIR) 10 MG tablet; Take 1 tablet (10 mg total) by mouth daily.  Dispense: 90 tablet; Refill: 1   Labs pending Health Maintenance reviewed Diet and exercise encouraged  Follow up plan: 6 months   Mary-Margaret Daphine Deutscher, FNP

## 2023-06-07 NOTE — Patient Instructions (Signed)

## 2023-06-08 LAB — CMP14+EGFR
ALT: 16 IU/L (ref 0–44)
AST: 19 IU/L (ref 0–40)
Albumin: 4.2 g/dL (ref 3.8–4.8)
Alkaline Phosphatase: 77 IU/L (ref 44–121)
BUN/Creatinine Ratio: 11 (ref 10–24)
BUN: 17 mg/dL (ref 8–27)
Bilirubin Total: 0.3 mg/dL (ref 0.0–1.2)
CO2: 25 mmol/L (ref 20–29)
Calcium: 9.1 mg/dL (ref 8.6–10.2)
Chloride: 101 mmol/L (ref 96–106)
Creatinine, Ser: 1.49 mg/dL — ABNORMAL HIGH (ref 0.76–1.27)
Globulin, Total: 2.5 g/dL (ref 1.5–4.5)
Glucose: 98 mg/dL (ref 70–99)
Potassium: 4.9 mmol/L (ref 3.5–5.2)
Sodium: 141 mmol/L (ref 134–144)
Total Protein: 6.7 g/dL (ref 6.0–8.5)
eGFR: 48 mL/min/{1.73_m2} — ABNORMAL LOW (ref >59)

## 2023-06-08 LAB — CBC WITH DIFFERENTIAL/PLATELET
Basophils Absolute: 0 10*3/uL (ref 0.0–0.2)
Basos: 1 %
EOS (ABSOLUTE): 0.2 10*3/uL (ref 0.0–0.4)
Eos: 3 %
Hematocrit: 41 % (ref 37.5–51.0)
Hemoglobin: 12.7 g/dL — ABNORMAL LOW (ref 13.0–17.7)
Immature Grans (Abs): 0 10*3/uL (ref 0.0–0.1)
Immature Granulocytes: 0 %
Lymphocytes Absolute: 2.3 10*3/uL (ref 0.7–3.1)
Lymphs: 30 %
MCH: 26.2 pg — ABNORMAL LOW (ref 26.6–33.0)
MCHC: 31 g/dL — ABNORMAL LOW (ref 31.5–35.7)
MCV: 85 fL (ref 79–97)
Monocytes Absolute: 0.8 10*3/uL (ref 0.1–0.9)
Monocytes: 11 %
Neutrophils Absolute: 4.1 10*3/uL (ref 1.4–7.0)
Neutrophils: 55 %
Platelets: 255 10*3/uL (ref 150–450)
RBC: 4.84 x10E6/uL (ref 4.14–5.80)
RDW: 14 % (ref 11.6–15.4)
WBC: 7.4 10*3/uL (ref 3.4–10.8)

## 2023-06-08 LAB — LIPID PANEL
Chol/HDL Ratio: 3.5 ratio (ref 0.0–5.0)
Cholesterol, Total: 150 mg/dL (ref 100–199)
HDL: 43 mg/dL (ref >39)
LDL Chol Calc (NIH): 71 mg/dL (ref 0–99)
Triglycerides: 222 mg/dL — ABNORMAL HIGH (ref 0–149)
VLDL Cholesterol Cal: 36 mg/dL (ref 5–40)

## 2023-06-09 ENCOUNTER — Encounter (HOSPITAL_BASED_OUTPATIENT_CLINIC_OR_DEPARTMENT_OTHER): Payer: Self-pay | Admitting: Pulmonary Disease

## 2023-06-09 ENCOUNTER — Ambulatory Visit (HOSPITAL_BASED_OUTPATIENT_CLINIC_OR_DEPARTMENT_OTHER): Payer: Medicare Other | Admitting: Pulmonary Disease

## 2023-06-09 VITALS — BP 128/64 | HR 67 | Resp 16 | Ht 74.0 in | Wt 260.0 lb

## 2023-06-09 DIAGNOSIS — Z23 Encounter for immunization: Secondary | ICD-10-CM

## 2023-06-09 DIAGNOSIS — J4489 Other specified chronic obstructive pulmonary disease: Secondary | ICD-10-CM

## 2023-06-09 NOTE — Progress Notes (Signed)
Subjective:   PATIENT ID: [name] [name] GENDER: male DOB: Apr 17, 1946, MRN: [id number]  Chief Complaint  Patient presents with   Follow-up    Reason for Visit: Shortness of breath  [name] is a 77 year old male former smoker with COPD, HTN, HLD, hx prostate cancer in remission, basal cell carcinoma s/p excision and palpitations who presents for shortness of breath.  Initial consult 08/2022 He was recently seen in Cardiology clinic in December. Note from Dr. Shari Prows on 08/20/22 reviewed. He reported fatigue and shortness of breath at the time. Walking 2000-3000 stpes. Planning for repeat echo and cardiac work-up. Referred to Pulmonary to rule out lung related dyspnea  He reports since his 62s he has reported dyspnea on exertion with running and retired from [employer] at that time. He gets fatigued quickly and has to take deep breaths. Able to walk short distances but inclines are difficult. Reports associated coughing and wheezing sometimes. Cough is usually productive. Only uses symbicort as needed.   09/03/22 - PFTs consistent with COPD-asthma overlap. Baseline symptoms shortness of breath associated with productive cough and wheezing worsened with exertion. Started on Advair 2024 - Resolved cough and wheezing on Advair. DOE remains.  11/30/22 Since our last visit, he was treated with zpack for sinusitis/URI in Feb 2024. Since then has improved. Shortness of breath with heavy exertion and walking uphill. He is using Advair as instructed. Not using rescue inhaler as often. Has not regular exercised but interested restarting running. Denies cough or wheezing.  03/11/23 Since our last visit he had an exacerbation April requiring amoxicillin and prednisone. He developed shortness of breath and productive cough that began three weeks ago. Is feeling better some but having wheezing at night when laying down. Compliant with Advair and using rescue inhaler 3 times a week and tries not  to take it often. Wheezing improves after sputum production. Denies fever, chills.  06/09/23 Since our last visit he reports doing well. He reports walking more but still has difficulty walking uphill. No coughing or wheezing. Denies exacerbations since our last visit. Very seldom using albuterol.   Social History: 1 ppd x 25 years. 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 Spent time overseas in [place] and [place] Sandblasting occasionally  Past Medical History:  Diagnosis Date   Arthritis    Asthma, chronic 06/02/2013   Carpal tunnel syndrome 04/17/2015   Bilateral   COPD (chronic obstructive pulmonary disease) (HCC)    Depression 06/02/2013   DYSPNEA ON EXERTION 06/04/2009   Qualifier: Diagnosis of  By: Diona Browner, MD, Hollace Hayward    Hereditary and idiopathic peripheral neuropathy 04/17/2015   Hyperlipemia 06/02/2013   Hypertension    LEG PAIN, BILATERAL 06/04/2009   Qualifier: Diagnosis of  By: Diona Browner, MD, Hollace Hayward    PERCUTANEOUS TRANSLUMINAL CORONARY ANGIOPLASTY, HX OF 06/04/2009   Qualifier: Diagnosis of  By: Diona Browner, MD, Hollace Hayward    Prostate CA [facility])    and basal cell nose     Family History  Problem Relation Age of Onset   Heart disease Other        Early Cardiac disease for uncle is their 30's as well as deceased.   Heart failure Mother    Dementia Mother 6   Cancer Father 49       testicular, lung   Esophageal cancer Brother      Social History   Occupational History   Occupation: Retired  Comment: Military  Tobacco Use   Smoking status: Former    Current packs/day: 0.00    Average packs/day: 1 pack/day for 20.0 years (20.0 ttl pk-yrs)    Types: Cigarettes    Start date: 01/18/1973    Quit date: 01/18/1993    Years since quitting: 30.4   Smokeless tobacco: Never  Vaping Use   Vaping status: Never Used  Substance and Sexual Activity   Alcohol use: Yes    Alcohol/week: 2.0 standard drinks of  alcohol    Types: 2 Cans of beer per week    Comment: occasional   Drug use: No   Sexual activity: Yes    Allergies  Allergen Reactions   Fenofibrate Other (See Comments)    Pt reports causes joint pains   Rosuvastatin Other (See Comments)    Pt reports causes joint pains     Outpatient Medications Prior to Visit  Medication Sig Dispense Refill   amLODipine (NORVASC) 5 MG tablet Take 1 tablet (5 mg total) by mouth daily. 90 tablet 1   aspirin 81 MG chewable tablet Chew 1 tablet (81 mg total) by mouth 2 (two) times daily. 30 tablet 0   atenolol (TENORMIN) 25 MG tablet Take 1 tablet (25 mg total) by mouth daily. 90 tablet 1   Cholecalciferol (VITAMIN D3) 25 MCG (1000 UT) CAPS Take 1,000 Units by mouth daily.     citalopram (CELEXA) 20 MG tablet Take 1 tablet (20 mg total) by mouth daily. 90 tablet 1   cyanocobalamin (VITAMIN B12) 1000 MCG/ML injection Inject 1 mL (1,000 mcg total) into the muscle every 30 (thirty) days. 3 mL 2   ezetimibe (ZETIA) 10 MG tablet Take 1 tablet (10 mg total) by mouth daily. 90 tablet 1   fluticasone-salmeterol (ADVAIR HFA) 230-21 MCG/ACT inhaler Inhale 2 puffs into the lungs 2 (two) times daily. 1 each 5   montelukast (SINGULAIR) 10 MG tablet Take 1 tablet (10 mg total) by mouth daily. 90 tablet 1   pravastatin (PRAVACHOL) 20 MG tablet Take 0.5 tablets (10 mg total) by mouth daily. 90 tablet 1   sildenafil (REVATIO) 20 MG tablet Take 20 mg by mouth as needed.     No facility-administered medications prior to visit.    Review of Systems  Constitutional:  Negative for chills, diaphoresis, fever, malaise/fatigue and weight loss.  HENT:  Negative for congestion.   Respiratory:  Positive for shortness of breath. Negative for cough, hemoptysis, sputum production and wheezing.   Cardiovascular:  Negative for chest pain, palpitations and leg swelling.     Objective:   Vitals:   06/09/23 1039  BP: 128/64  Pulse: 67  Resp: 16  SpO2: 95%  Weight: 260 lb  (117.9 kg)  Height: 6\' 2"  (1.88 m)   SpO2: 95 %  Physical Exam: General: Well-appearing, no acute distress HENT: [place], AT Eyes: EOMI, no scleral icterus Respiratory: Clear to auscultation bilaterally.  No crackles, wheezing or rales Cardiovascular: RRR, -M/R/G, no JVD Extremities:-Edema,-tenderness Neuro: AAO x4, CNII-XII grossly intact Psych: Normal mood, normal affect  Data Reviewed:  Imaging: CXR 12/03/22 - No acute infiltrate effusion or edema  PFT: 09/03/22 FVC 3.77 (77%) FEV1 2.51 (70%) Ratio 64  TLC 102% RV 157% RV/TLC 145% DLCO 107% Interpretation: Based on FEV1/SVC, mild obstructive defect with significant bronchodilator response. Air trapping present.  Labs: CBC    Component Value Date/Time   WBC 7.4 06/07/2023 1358   WBC 16.2 (H) 04/26/2021 0313   RBC 4.84 06/07/2023 1358  RBC 3.73 (L) 04/26/2021 0313   HGB 12.7 (L) 06/07/2023 1358   HCT 41.0 06/07/2023 1358   PLT 255 06/07/2023 1358   MCV 85 06/07/2023 1358   MCH 26.2 (L) 06/07/2023 1358   MCH 30.8 04/26/2021 0313   MCHC 31.0 (L) 06/07/2023 1358   MCHC 32.5 04/26/2021 0313   RDW 14.0 06/07/2023 1358   LYMPHSABS 2.3 06/07/2023 1358   MONOABS 0.5 10/08/2014 1811   EOSABS 0.2 06/07/2023 1358   BASOSABS 0.0 06/07/2023 1358   Absolute eos 07/31/22 - 200     Assessment & Plan:   Discussion: 77 year old male former smoker with COPD-asthma overlap, HTN, HLD, hx prostate cancer in remission, basal cell carcinoma s/p excision and palpitations who presents for follow-up. Discussed clinical course and management of COPD/asthma including bronchodilator regimen, preventive care including vaccinations and action plan for exacerbation. Administer RSV vaccine today. Already received influenza. Plan for covid vaccine next week.   COPD-asthma overlap  History tobacco abuse --CONTINUE Advair 230-21 mcg TWO puffs in morning and evening --CONTINUE Albuterol AS NEEDED for shortness of breath, chest tightness or  wheezing --CONTINUE singulair 10 mg daily --CONTINUE exercising 3-5 days a week. Reconsider pulmonary rehab at next visit  --He is considering Pulmonary Rehab in January. He will contact us later this winter to place referral  Health Maintenance Immunization History  Administered Date(s) Administered   Fluad Quad(high Dose 65+) 06/21/2019, 07/02/2020, 06/18/2021, 06/04/2022   Fluad Trivalent(High Dose 65+) 06/07/2023   Influenza, High Dose Seasonal PF 07/07/2016, 06/15/2017, 07/14/2018   Influenza,inj,Quad PF,6+ Mos 06/27/2013, 06/25/2014, 06/28/2015   Influenza-Unspecified 06/08/2023   Moderna Covid-19 Vaccine Bivalent Booster 55yrs & up 07/15/2021   Moderna SARS-COV2 Booster Vaccination 07/11/2020, 01/12/2021   Moderna Sars-Covid-2 Vaccination 10/20/2019, 11/18/2019   Pfizer(Comirnaty)Fall Seasonal Vaccine 12 years and older 07/14/2022   Pneumococcal Conjugate-13 06/28/2015   Pneumococcal Polysaccharide-23 10/27/2011   Respiratory Syncytial Virus Vaccine,Recomb Aduvanted(Arexvy) 06/25/2022   Tdap 07/07/2016, 02/24/2023   Zoster Recombinant(Shingrix) 12/09/2017, 02/16/2018   CT Lung Screen - not qualified. Quit smoking > 15 years  Orders Placed This Encounter  Procedures   RSV,Recombinant PF (Arexvy)   No orders of the defined types were placed in this encounter.   Return in about 5 months (around 11/09/2023).   I have spent a total time of 34-minutes on the day of the appointment including chart review, data review, collecting history, coordinating care and discussing medical diagnosis and plan with the patient/family. Past medical history, allergies, medications were reviewed. Pertinent imaging, labs and tests included in this note have been reviewed and interpreted independently by me.  Rj Pedrosa Mechele Collin, MD [facility] 06/09/2023 11:18 AM  Office Number [phone]

## 2023-06-09 NOTE — Patient Instructions (Signed)
COPD-asthma overlap  History tobacco abuse --CONTINUE Advair 230-21 mcg TWO puffs in morning and evening --CONTINUE Albuterol AS NEEDED for shortness of breath, chest tightness or wheezing --CONTINUE singulair 10 mg daily --CONTINUE exercising 3-5 days a week. Reconsider pulmonary rehab at next visit  --He is considering Pulmonary Rehab in January. He will contact us later this winter to place referral  Administer RSV vaccine today

## 2023-06-11 ENCOUNTER — Ambulatory Visit (INDEPENDENT_AMBULATORY_CARE_PROVIDER_SITE_OTHER): Payer: Medicare Other | Admitting: Orthopedic Surgery

## 2023-06-11 ENCOUNTER — Encounter: Payer: Medicare Other | Admitting: Rehabilitative and Restorative Service Providers"

## 2023-06-11 DIAGNOSIS — G5603 Carpal tunnel syndrome, bilateral upper limbs: Secondary | ICD-10-CM

## 2023-06-11 DIAGNOSIS — Z9889 Other specified postprocedural states: Secondary | ICD-10-CM

## 2023-06-11 NOTE — Progress Notes (Signed)
[name] [name] - 77 y.o. male MRN [id number]  Date of birth: 08/27/1946  Office Visit Note: Visit Date: 06/11/2023 PCP: Bennie Pierini, FNP Referred by: Daphine Deutscher, Mary-Margaret, *  Subjective:  HPI: [name] [name] is a 77 y.o. male who presents today for follow up 2 weeks status post left open carpal tunnel release.  Also with known right-sided carpal tunnel syndrome that is refractory conservative care.  Pertinent ROS were reviewed with the patient and found to be negative unless otherwise specified above in HPI.   Assessment & Plan: Visit Diagnoses: No diagnosis found.  Plan: He is doing very well postoperatively.  Sutures removed today without incident.  Can do home exercise program moving forward.  As previously documented, he does have clinical and electrodiagnostic evidence of right sided carpal tunnel syndrome that is refractory to conservative care.  He would like to move forward surgical scheduling for the right side as previously planned.  Risks and benefits of the procedure were discussed, risks including but not limited to infection, bleeding, scarring, stiffness, nerve injury, tendon injury, vascular injury, hardware complication if utilized, recurrence of symptoms and need for subsequent operation.  Patient expressed understanding.    Will move forward with surgical scheduling of right open carpal tunnel release under local anesthesia.    Follow-up: No follow-ups on file.   Meds & Orders: No orders of the defined types were placed in this encounter.  No orders of the defined types were placed in this encounter.    Procedures: No procedures performed       Objective:   Vital Signs: There were no vitals taken for this visit.  Ortho Exam General: Patient is well appearing and in no distress. Cervical spine mobility is full in all directions:  Well-healing carpal tunnel incision, left palm, sutures in place, skin edges well-approximated, no erythema or  drainage.   Range of Motion and Palpation Tests: Mobility is full about the elbows with flexion and extension.  No evidence of nerve subluxation at bilateral elbow.  Forearm supination and pronation are 85/85 bilaterally.  Wrist flexion/extension is 75/65 bilaterally.  Digital flexion and extension are full.  Thumb opposition is full to the base of the small fingers bilaterally.     Neurologic, Vascular, Motor: Sensation is diminished to light touch in the bilateral median distributions.    Tinel's testing positive right carpal tunnel Phalen's positive right, Derkan's compression positive right Thenar atrophy: Mild left APB: 5/5 bilateral   Fingers pink and well perfused.  Capillary refill is brisk.    Imaging: No results found.   Milledge Gerding Trevor Mace, M.D. [facility] 8:24 AM

## 2023-06-16 ENCOUNTER — Other Ambulatory Visit (HOSPITAL_BASED_OUTPATIENT_CLINIC_OR_DEPARTMENT_OTHER): Payer: Self-pay

## 2023-06-21 DIAGNOSIS — L579 Skin changes due to chronic exposure to nonionizing radiation, unspecified: Secondary | ICD-10-CM | POA: Diagnosis not present

## 2023-06-21 DIAGNOSIS — L570 Actinic keratosis: Secondary | ICD-10-CM | POA: Diagnosis not present

## 2023-06-21 DIAGNOSIS — D235 Other benign neoplasm of skin of trunk: Secondary | ICD-10-CM | POA: Diagnosis not present

## 2023-06-21 DIAGNOSIS — D225 Melanocytic nevi of trunk: Secondary | ICD-10-CM | POA: Diagnosis not present

## 2023-06-21 DIAGNOSIS — L820 Inflamed seborrheic keratosis: Secondary | ICD-10-CM | POA: Diagnosis not present

## 2023-06-21 DIAGNOSIS — L814 Other melanin hyperpigmentation: Secondary | ICD-10-CM | POA: Diagnosis not present

## 2023-06-21 DIAGNOSIS — Z85828 Personal history of other malignant neoplasm of skin: Secondary | ICD-10-CM | POA: Diagnosis not present

## 2023-06-21 DIAGNOSIS — L821 Other seborrheic keratosis: Secondary | ICD-10-CM | POA: Diagnosis not present

## 2023-06-23 NOTE — Telephone Encounter (Signed)
Error

## 2023-07-23 DIAGNOSIS — G5601 Carpal tunnel syndrome, right upper limb: Secondary | ICD-10-CM | POA: Diagnosis not present

## 2023-07-26 HISTORY — PX: CARPAL TUNNEL RELEASE: SHX101

## 2023-08-06 ENCOUNTER — Ambulatory Visit (INDEPENDENT_AMBULATORY_CARE_PROVIDER_SITE_OTHER): Payer: Medicare Other | Admitting: Orthopedic Surgery

## 2023-08-06 DIAGNOSIS — Z9889 Other specified postprocedural states: Secondary | ICD-10-CM

## 2023-08-06 NOTE — Progress Notes (Signed)
   JULIEN SKARDA - 77 y.o. male MRN [id number]  Date of birth: 09-05-1946  Office Visit Note: Visit Date: 08/06/2023 PCP: Bennie Pierini, FNP Referred by: Daphine Deutscher, Mary-Margaret, *  Subjective:  HPI: KORTEZ HAAGEN is a 77 y.o. male who presents today for follow up 2 weeks status post right open carpal tunnel release.  Pertinent ROS were reviewed with the patient and  to be negative unless otherwise specified above in HPI.   Assessment & Plan: Visit Diagnoses: No diagnosis found.  Plan: He is doing very well postoperatively.  Sutures removed today.  Continue with activities as tolerated.  Left side also continues to do very well.  He is welcome to follow-up as needed.  He is very pleased with his outcome.  Follow-up: No follow-ups on file.   Meds & Orders: No orders of the defined types were placed in this encounter.  No orders of the defined types were placed in this encounter.    Procedures: No procedures performed       Objective:   Vital Signs: There were no vitals taken for this visit.  Ortho Exam Right hand: - Well-healing palmar incision, sutures in place, skin edges well-approximated without erythema or drainage - Composite fist without restriction - Sensation intact in all distributions to light touch including median nerve distribution - 5/5 APB without significant thenar atrophy  Imaging: No results found.   Ardena Gangl Trevor Mace, M.D. [facility] 9:12 AM

## 2023-08-19 ENCOUNTER — Encounter: Payer: Self-pay | Admitting: Physical Medicine and Rehabilitation

## 2023-08-20 ENCOUNTER — Other Ambulatory Visit: Payer: Self-pay | Admitting: Physical Medicine and Rehabilitation

## 2023-08-20 DIAGNOSIS — M5416 Radiculopathy, lumbar region: Secondary | ICD-10-CM

## 2023-08-25 DIAGNOSIS — Z8546 Personal history of malignant neoplasm of prostate: Secondary | ICD-10-CM | POA: Diagnosis not present

## 2023-08-25 NOTE — Discharge Instructions (Signed)

## 2023-08-26 ENCOUNTER — Ambulatory Visit
Admission: RE | Admit: 2023-08-26 | Discharge: 2023-08-26 | Disposition: A | Discharge status: Home / Self Care | Payer: Medicare Other | Source: Ambulatory Visit | Attending: Physical Medicine and Rehabilitation | Admitting: Physical Medicine and Rehabilitation

## 2023-08-26 ENCOUNTER — Other Ambulatory Visit: Payer: Self-pay | Admitting: Physical Medicine and Rehabilitation

## 2023-08-26 ENCOUNTER — Other Ambulatory Visit: Payer: Self-pay | Admitting: Nurse Practitioner

## 2023-08-26 DIAGNOSIS — J4489 Other specified chronic obstructive pulmonary disease: Secondary | ICD-10-CM

## 2023-08-26 DIAGNOSIS — M5416 Radiculopathy, lumbar region: Secondary | ICD-10-CM

## 2023-08-26 DIAGNOSIS — M4727 Other spondylosis with radiculopathy, lumbosacral region: Secondary | ICD-10-CM | POA: Diagnosis not present

## 2023-08-26 DIAGNOSIS — I10 Essential (primary) hypertension: Secondary | ICD-10-CM

## 2023-08-26 MED ORDER — METHYLPREDNISOLONE ACETATE 40 MG/ML INJ SUSP (RADIOLOG
80.0000 mg | Freq: Once | INTRAMUSCULAR | Status: AC
  Administered 2023-08-26: 80 mg via EPIDURAL

## 2023-08-26 MED ORDER — AMLODIPINE BESYLATE 5 MG PO TABS
5.0000 mg | ORAL_TABLET | Freq: Every day | ORAL | 1 refills | Status: DC
Start: 2023-08-26 — End: 2023-12-02

## 2023-08-26 MED ORDER — IOPAMIDOL (ISOVUE-M 200) INJECTION 41%
1.0000 mL | Freq: Once | INTRAMUSCULAR | Status: AC
  Administered 2023-08-26: 1 mL via EPIDURAL

## 2023-08-26 MED ORDER — FLUTICASONE-SALMETEROL 230-21 MCG/ACT IN AERO: 2.0000 | INHALATION_SPRAY | Freq: Two times a day (BID) | RESPIRATORY_TRACT | 5 refills | Status: DC

## 2023-08-26 MED ORDER — ATENOLOL 25 MG PO TABS: 25.0000 mg | ORAL_TABLET | Freq: Every day | ORAL | 1 refills | Status: DC

## 2023-08-26 NOTE — Telephone Encounter (Signed)
Copied from CRM [id number]. Topic: Clinical - Medication Refill >> Aug 26, 2023 10:38 AM Maxwell Marion wrote: Most Recent Primary Care Visit:  Provider: Bennie Pierini  Department: [facility]  Visit Type: OFFICE VISIT  Date: 06/07/2023  Medication: amLODipine (NORVASC) 5 MG tablet atenolol (TENORMIN) 25 MG tablet   Has the patient contacted their pharmacy?  (Agent: If no, request that the patient contact the pharmacy for the refill. If patient does not wish to contact the pharmacy document the reason why and proceed with request.) (Agent: If yes, when and what did the pharmacy advise?)  Is this the correct pharmacy for this prescription?  If no, delete pharmacy and type the correct one.  This is the patient's preferred pharmacy:  [facility] [place], [place] - [address] [place] [address] Phone: [phone] Fax: [phone]  [id number] - [place], [place] - [address] AT [facility] [address] [place] [address] Phone: [phone] Fax: [phone]   Has the prescription been filled recently?   Is the patient out of the medication?   Has the patient been seen for an appointment in the last year OR does the patient have an upcoming appointment?   Can we respond through MyChart?   Agent: Please be advised that Rx refills may take up to 3 business days. We ask that you follow-up with your pharmacy.

## 2023-09-21 ENCOUNTER — Telehealth: Payer: Self-pay | Admitting: Physical Medicine and Rehabilitation

## 2023-09-21 NOTE — Telephone Encounter (Signed)
 Pt's wife called requesting an appt with Dr Alvester Morin for pt. Please call pt at [phone].

## 2023-09-23 ENCOUNTER — Other Ambulatory Visit: Payer: Self-pay | Admitting: Physical Medicine and Rehabilitation

## 2023-09-23 ENCOUNTER — Telehealth: Payer: Self-pay

## 2023-09-23 DIAGNOSIS — M5416 Radiculopathy, lumbar region: Secondary | ICD-10-CM

## 2023-09-23 NOTE — Telephone Encounter (Signed)
 error

## 2023-09-24 DIAGNOSIS — R351 Nocturia: Secondary | ICD-10-CM | POA: Diagnosis not present

## 2023-09-24 DIAGNOSIS — Z8546 Personal history of malignant neoplasm of prostate: Secondary | ICD-10-CM | POA: Diagnosis not present

## 2023-09-24 DIAGNOSIS — N5231 Erectile dysfunction following radical prostatectomy: Secondary | ICD-10-CM | POA: Diagnosis not present

## 2023-10-06 ENCOUNTER — Other Ambulatory Visit: Payer: Self-pay | Admitting: Family

## 2023-10-07 ENCOUNTER — Ambulatory Visit: Payer: Medicare Other | Admitting: Physical Medicine and Rehabilitation

## 2023-10-07 ENCOUNTER — Other Ambulatory Visit: Payer: Self-pay

## 2023-10-07 DIAGNOSIS — M4316 Spondylolisthesis, lumbar region: Secondary | ICD-10-CM

## 2023-10-07 DIAGNOSIS — M5441 Lumbago with sciatica, right side: Secondary | ICD-10-CM

## 2023-10-07 DIAGNOSIS — M5442 Lumbago with sciatica, left side: Secondary | ICD-10-CM

## 2023-10-07 DIAGNOSIS — M48062 Spinal stenosis, lumbar region with neurogenic claudication: Secondary | ICD-10-CM

## 2023-10-07 DIAGNOSIS — M5416 Radiculopathy, lumbar region: Secondary | ICD-10-CM | POA: Diagnosis not present

## 2023-10-07 DIAGNOSIS — G8929 Other chronic pain: Secondary | ICD-10-CM | POA: Diagnosis not present

## 2023-10-07 MED ORDER — METHYLPREDNISOLONE ACETATE 40 MG/ML IJ SUSP
40.0000 mg | Freq: Once | INTRAMUSCULAR | Status: AC
  Administered 2023-10-07: 40 mg

## 2023-10-07 NOTE — Patient Instructions (Signed)

## 2023-10-07 NOTE — Progress Notes (Signed)
Functional Pain Scale - descriptive words and definitions  Mild (2)   Noticeable when not distracted/no impact on ADL's/sleep only slightly affected and able to   use both passive and active distraction for comfort. Mild range order  Average Pain 3 If he stands for a long time his L leg starts throbbing really bad.  And causes him a lot of pain.  L>R   +Driver, -BT, -Dye Allergies.

## 2023-10-14 ENCOUNTER — Encounter: Payer: Self-pay | Admitting: Physical Medicine and Rehabilitation

## 2023-10-14 NOTE — Procedures (Signed)
Lumbosacral Transforaminal Epidural Steroid Injection - Sub-Pedicular Approach with Fluoroscopic Guidance  Patient: [name] [name]      Date of Birth: 1946-07-17 MRN: [id number] PCP: Bennie Pierini, FNP      Visit Date: 10/07/2023   Universal Protocol:    Date/Time: 10/07/2023  Consent Given By: the patient  Position: PRONE  Additional Comments: Vital signs were monitored before and after the procedure. Patient was prepped and draped in the usual sterile fashion. The correct patient, procedure, and site was verified.   Injection Procedure Details:   Procedure diagnoses: Lumbar radiculopathy [M54.16]    Meds Administered:  Meds ordered this encounter  Medications   methylPREDNISolone acetate (DEPO-MEDROL) injection 40 mg    Laterality: Left, Right  Location/Site: L5, L4  Needle:6.0 in., 22 ga.  Short bevel or Quincke spinal needle  Needle Placement: Transforaminal  Findings:    -Comments: Excellent flow of contrast along the nerve, nerve root and into the epidural space.  Procedure Details: After squaring off the end-plates to get a true AP view, the C-arm was positioned so that an oblique view of the foramen as noted above was visualized. The target area is just inferior to the "nose of the scotty dog" or sub pedicular. The soft tissues overlying this structure were infiltrated with 2-3 ml. of 1% Lidocaine without Epinephrine.  The spinal needle was inserted toward the target using a "trajectory" view along the fluoroscope beam.  Under AP and lateral visualization, the needle was advanced so it did not puncture dura and was located close the 6 O'Clock position of the pedical in AP tracterory. Biplanar projections were used to confirm position. Aspiration was confirmed to be negative for CSF and/or blood. A 1-2 ml. volume of Isovue-250 was injected and flow of contrast was noted at each level. Radiographs were obtained for documentation purposes.   After attaining  the desired flow of contrast documented above, a 0.5 to 1.0 ml test dose of 0.25% Marcaine was injected into each respective transforaminal space.  The patient was observed for 90 seconds post injection.  After no sensory deficits were reported, and normal lower extremity motor function was noted,   the above injectate was administered so that equal amounts of the injectate were placed at each foramen (level) into the transforaminal epidural space.   Additional Comments:  The patient tolerated the procedure well Dressing: 2 x 2 sterile gauze and Band-Aid    Post-procedure details: Patient was observed during the procedure. Post-procedure instructions were reviewed.  Patient left the clinic in stable condition.

## 2023-10-14 NOTE — Progress Notes (Signed)
[name] [name] - 78 y.o. male MRN [id number]  Date of birth: 1946/01/31  Office Visit Note: Visit Date: 10/07/2023 PCP: Bennie Pierini, FNP Referred by: Daphine Deutscher, Mary-Margaret, *  Subjective: Chief Complaint  Patient presents with   Lower Back - Pain   HPI: [name] [name] is a 78 y.o. male who comes in today for evaluation and management for chronic long-term history of back pain with radicular type complaints down both legs but more recently worse on the left than right.  Patient is well-known to [place] and he has seen multiple orthopedic doctors in the office as well as a prior patient of Dr. Vira Browns before he retired.  I have seen him in the past for prior spine injections both facet joint blocks and epidural injections with decent relief of symptoms but with varying results.  The last time I saw him in the office was in 2023.  He has had his knee replaced since then.  He was really in a lot of pain before Christmas and had 2 injections performed at [facility] imaging.  This was a bilateral L4 transforaminal injection.  I did review those images today and they do show good placement of the needle with good contrast flow.  Unfortunately he reports that those injections did not "take ".  And he is having a lot of pain particularly on the left leg with a throbbing aching pain down into the foot and somewhat of an L5 distribution.  The right sided symptoms are still into the thigh lower back could be more L4-L5 radicular in nature could be facet mediated.  Prior MRI from 2023 reviewed again today reviewed below in the notes shows stenosis lateral recess central canal as well as pretty significant grade 1 listhesis.  He has not had any focal weakness.  His case is complicated by prediabetes which is diet controlled but also with history of hereditary and idiopathic peripheral polyneuropathy with electrodiagnostic study of both lower limbs in 2016 showing a peripheral polyneuropathy.  Has had  multiple rounds of physical therapy and stays pretty active otherwise and does home exercise program.  Has tried medications without really much in the way of help.   I spent more than 30 minutes speaking face-to-face with the patient with 50% of the time in counseling and discussing coordination of care.       Review of Systems  Musculoskeletal:  Positive for back pain and joint pain.  Neurological:  Positive for tingling and weakness.  All other systems reviewed and are negative.  Otherwise per HPI.  Assessment & Plan: Visit Diagnoses:    ICD-10-CM   1. Lumbar radiculopathy  M54.16 XR C-ARM NO REPORT    Epidural Steroid injection    methylPREDNISolone acetate (DEPO-MEDROL) injection 40 mg    2. Spinal stenosis of lumbar region with neurogenic claudication  M48.062     3. Spondylolisthesis of lumbar region  M43.16     4. Chronic bilateral low back pain with bilateral sciatica  M54.42    M54.41    G89.29        Plan: Findings:  In terms of his back hip and leg pain it is a little bit complicated with his history of polyneuropathy and history of pretty significant lateral recess stenosis and facet arthropathy.  His pain is obviously multifactorial at this point.  The injections performed [facility] did not give him as much relief as he had hoped although he felt like the right side did give  him more relief than the left but both are really hurting again that was just in December.  I think the right approach is to complete diagnostic and therapeutic left-sided L5 transforaminal injection based on the dermatomal pattern.  And then also a right L4 injection to see if we get him some relief.  If he is not getting much relief after that are short-lived consideration would have to be given to following up with Dr. Willia Craze from a spine surgery standpoint versus potential for spinal cord stimulator trial and continued medication management.  The injection performed today Duda  significant severity of his symptoms.    Meds & Orders:  Meds ordered this encounter  Medications   methylPREDNISolone acetate (DEPO-MEDROL) injection 40 mg    Orders Placed This Encounter  Procedures   XR C-ARM NO REPORT   Epidural Steroid injection    Follow-up: Return for visit to requesting provider as needed.   Procedures: No procedures performed  Lumbosacral Transforaminal Epidural Steroid Injection - Sub-Pedicular Approach with Fluoroscopic Guidance  Patient: [name] [name]      Date of Birth: August 22, 1946 MRN: [id number] PCP: Bennie Pierini, FNP      Visit Date: 10/07/2023   Universal Protocol:    Date/Time: 10/07/2023  Consent Given By: the patient  Position: PRONE  Additional Comments: Vital signs were monitored before and after the procedure. Patient was prepped and draped in the usual sterile fashion. The correct patient, procedure, and site was verified.   Injection Procedure Details:   Procedure diagnoses: Lumbar radiculopathy [M54.16]    Meds Administered:  Meds ordered this encounter  Medications   methylPREDNISolone acetate (DEPO-MEDROL) injection 40 mg    Laterality: Left, Right  Location/Site: L5, L4  Needle:6.0 in., 22 ga.  Short bevel or Quincke spinal needle  Needle Placement: Transforaminal  Findings:    -Comments: Excellent flow of contrast along the nerve, nerve root and into the epidural space.  Procedure Details: After squaring off the end-plates to get a true AP view, the C-arm was positioned so that an oblique view of the foramen as noted above was visualized. The target area is just inferior to the "nose of the scotty dog" or sub pedicular. The soft tissues overlying this structure were infiltrated with 2-3 ml. of 1% Lidocaine without Epinephrine.  The spinal needle was inserted toward the target using a "trajectory" view along the fluoroscope beam.  Under AP and lateral visualization, the needle was advanced so it did  not puncture dura and was located close the 6 O'Clock position of the pedical in AP tracterory. Biplanar projections were used to confirm position. Aspiration was confirmed to be negative for CSF and/or blood. A 1-2 ml. volume of Isovue-250 was injected and flow of contrast was noted at each level. Radiographs were obtained for documentation purposes.   After attaining the desired flow of contrast documented above, a 0.5 to 1.0 ml test dose of 0.25% Marcaine was injected into each respective transforaminal space.  The patient was observed for 90 seconds post injection.  After no sensory deficits were reported, and normal lower extremity motor function was noted,   the above injectate was administered so that equal amounts of the injectate were placed at each foramen (level) into the transforaminal epidural space.   Additional Comments:  The patient tolerated the procedure well Dressing: 2 x 2 sterile gauze and Band-Aid    Post-procedure details: Patient was observed during the procedure. Post-procedure instructions were reviewed.  Patient left the clinic  in stable condition.    Clinical History: MRI LUMBAR SPINE WITHOUT AND WITH CONTRAST    TECHNIQUE:  Multiplanar and multiecho pulse sequences of the lumbar spine were  obtained without and with intravenous contrast.    CONTRAST:  20mL MULTIHANCE GADOBENATE DIMEGLUMINE 529 MG/ML IV SOLN    COMPARISON:  07/21/2020    FINDINGS:  Segmentation: 5 lumbar type vertebrae based on prior    Alignment:  Grade 1 anterolisthesis at L4-5, mildly progressed.    Vertebrae:  No fracture, evidence of discitis, or bone lesion.    Conus medullaris and cauda equina: Conus extends to the T12-L1  level. Conus and cauda equina appear normal.    Paraspinal and other soft tissues: Lower pole renal cyst on the  left.    Disc levels:    T12- L1: Spondylosis with ventral spur that is likely bridging    L1-L2: Fairly bulky ventral spondylitic spur.   No impingement    L2-L3: Ventral spondylitic spurring.  Mild disc bulging.    L3-L4: Disc narrowing and bulging with ventral spurring. Bilateral  facet spurring and ligamentum flavum thickening. Anterior and medial  synovial cyst from the left facet measuring 5 mm. No neural  compression.    L4-L5: Advanced facet degeneration with spurring and  anterolisthesis. Bilobed anterior synovial cyst measuring 14 mm with  compression of the right L4 nerve root. Patent left foramen.  Moderate spinal stenosis    L5-S1:Facet osteoarthritis with asymmetric bulky spurring. Eccentric  right disc bulging and far-lateral spurring. Moderate right  foraminal stenosis.    IMPRESSION:  1. Generalized lumbar spine degeneration with progression from 2021.  2. L4-5 severe facet osteoarthritis with anterolisthesis and 14 mm  right synovial cyst that compresses the right L4 nerve root.  Moderate spinal stenosis at this level.  3. L3-4 left facet synovial cyst which is new but noncompressive.      Electronically Signed    By: Tiburcio Pea M.D.    On: 10/28/2021 19:27   04/17/2015 EMG/NCS IMPRESSION:   Nerve conduction studies done on all 4 extremities shows evidence of mild bilateral carpal tunnel syndrome. There is evidence of motor and sensory impairment on nerve conduction studies on both lower extremities consistent with the diagnosis of peripheral neuropathy. EMG evaluation of the left lower extremity does show mild distal chronic denervation consistent with the diagnosis of peripheral neuropathy. No clear evidence of an overlying lumbosacral radiculopathy is seen.   Marlan Palau MD 04/17/2015 11:26 AM   [facility] [address] [place], [place] [address]   He reports that he quit smoking about 30 years ago. His smoking use included cigarettes. He started smoking about 50 years ago. He has a 20 pack-year smoking history. He has never used smokeless tobacco.   Recent Labs    12/03/22 1408 06/07/23 1353  HGBA1C 6.5* 6.3*    Objective:  VS:  HT:    WT:   BMI:     BP:   HR: bpm  TEMP: ( )  RESP:  Physical Exam Vitals and nursing note reviewed.  Constitutional:      General: He is not in acute distress.    Appearance: Normal appearance. He is well-developed. He is obese. He is not ill-appearing.  HENT:     Head: Normocephalic and atraumatic.     Right Ear: External ear normal.     Left Ear: External ear normal.     Nose: No congestion.  Eyes:  Extraocular Movements: Extraocular movements intact.     Conjunctiva/sclera: Conjunctivae normal.     Pupils: Pupils are equal, round, and reactive to light.  Cardiovascular:     Rate and Rhythm: Normal rate.     Pulses: Normal pulses.     Heart sounds: Normal heart sounds.  Pulmonary:     Effort: Pulmonary effort is normal. No respiratory distress.  Abdominal:     General: There is no distension.     Palpations: Abdomen is soft.  Musculoskeletal:        General: No tenderness or signs of injury.     Cervical back: Normal range of motion and neck supple. No rigidity.     Right lower leg: No edema.     Left lower leg: No edema.     Comments: Patient has good distal strength without clonus.  He has no pain with hip rotation.  He does have back pain with extension and facet loading.  No focal trigger points.  No pain over the greater trochanters.  He does have some impaired sensation or dysesthesia in the left L5 dermatome.  Skin:    General: Skin is warm and dry.     Findings: No erythema or rash.  Neurological:     General: No focal deficit present.     Mental Status: He is alert and oriented to person, place, and time.     Sensory: No sensory deficit.     Motor: No weakness or abnormal muscle tone.     Coordination: Coordination normal.     Gait: Gait normal.  Psychiatric:        Mood and Affect: Mood normal.        Behavior: Behavior normal.     Ortho Exam  Imaging: No  results found.  Past Medical/Family/Surgical/Social History: Medications & Allergies reviewed per EMR, new medications updated. Patient Active Problem List   Diagnosis Date Noted   Subcutaneous nodule of left thumb    Distal interphalangeal nodule 01/20/2022   Status post total left knee replacement 04/25/2021   Status post total right knee replacement 01/03/2021   Diverticular disease of colon 09/25/2020   Statin intolerance 12/27/2018   BMI 28.0-28.9,adult 11/01/2018   H/O arthroscopy of shoulder 02/10/2018   Primary osteoarthritis of left knee 11/25/2016   Primary osteoarthritis of right knee 11/25/2016   Essential hypertension 06/28/2015   H/O prostate cancer 06/28/2015   Hereditary and idiopathic peripheral neuropathy 04/17/2015   Carpal tunnel syndrome 04/17/2015   Hyperlipemia 06/02/2013   COPD with asthma (HCC) 06/02/2013   Depression 06/02/2013   GERD 06/04/2009   PERCUTANEOUS TRANSLUMINAL CORONARY ANGIOPLASTY, HX OF 06/04/2009   Past Medical History:  Diagnosis Date   Arthritis    Asthma, chronic 06/02/2013   Carpal tunnel syndrome 04/17/2015   Bilateral   COPD (chronic obstructive pulmonary disease) (HCC)    Depression 06/02/2013   DYSPNEA ON EXERTION 06/04/2009   Qualifier: Diagnosis of  By: Diona Browner, MD, Hollace Hayward    Hereditary and idiopathic peripheral neuropathy 04/17/2015   Hyperlipemia 06/02/2013   Hypertension    LEG PAIN, BILATERAL 06/04/2009   Qualifier: Diagnosis of  By: Diona Browner, MD, Hollace Hayward    PERCUTANEOUS TRANSLUMINAL CORONARY ANGIOPLASTY, HX OF 06/04/2009   Qualifier: Diagnosis of  By: Diona Browner, MD, Hollace Hayward    Prostate CA [facility])    and basal cell nose   Family History  Problem Relation Age of Onset   Heart disease Other  Early Cardiac disease for uncle is their 37's as well as deceased.   Heart failure Mother    Dementia Mother 9   Cancer Father 12       testicular, lung   Esophageal cancer  Brother    Past Surgical History:  Procedure Laterality Date   CARDIAC CATHETERIZATION  1997   CARPAL TUNNEL RELEASE Left 05/26/2023   EXCISION MASS NECK Left 11/12/2020   Procedure: EXCISION LEFT POSTERIOR NECK MASS;  Surgeon: Abigail Miyamoto, MD;  Location: [facility];  Service: General;  Laterality: Left;   EXCISION METACARPAL MASS Left 02/04/2022   Procedure: LEFT THUMB EXCISION MASS;  Surgeon: Marlyne Beards, MD;  Location: [facility];  Service: Orthopedics;  Laterality: Left;   EYE SURGERY Bilateral 2020   cataracts removed   HERNIA REPAIR     HIATAL HERNIA REPAIR     LAPAROSCOPIC GASTRIC BANDING  2000   [facility] fundoplication   MENISCUS REPAIR Left 2012   ROBOT ASSISTED LAPAROSCOPIC RADICAL PROSTATECTOMY     ROTATOR CUFF REPAIR Right 2019   TOTAL KNEE ARTHROPLASTY Right 01/03/2021   Procedure: RIGHT TOTAL KNEE ARTHROPLASTY;  Surgeon: Kathryne Hitch, MD;  Location: [facility];  Service: Orthopedics;  Laterality: Right;  Needs RNFA   TOTAL KNEE ARTHROPLASTY Left 04/25/2021   Procedure: LEFT TOTAL KNEE ARTHROPLASTY;  Surgeon: Kathryne Hitch, MD;  Location: [facility];  Service: Orthopedics;  Laterality: Left;   Social History   Occupational History   Occupation: Retired    Comment: Hotel manager  Tobacco Use   Smoking status: Former    Current packs/day: 0.00    Average packs/day: 1 pack/day for 20.0 years (20.0 ttl pk-yrs)    Types: Cigarettes    Start date: 01/18/1973    Quit date: 01/18/1993    Years since quitting: 30.7   Smokeless tobacco: Never  Vaping Use   Vaping status: Never Used  Substance and Sexual Activity   Alcohol use: Yes    Alcohol/week: 2.0 standard drinks of alcohol    Types: 2 Cans of beer per week    Comment: occasional   Drug use: No   Sexual activity: Yes

## 2023-11-03 ENCOUNTER — Ambulatory Visit: Payer: Medicare Other

## 2023-11-03 VITALS — Ht 74.0 in | Wt 260.0 lb

## 2023-11-03 DIAGNOSIS — Z0000 Encounter for general adult medical examination without abnormal findings: Secondary | ICD-10-CM | POA: Diagnosis not present

## 2023-11-03 NOTE — Patient Instructions (Signed)
[name] [name] , Thank you for taking time to come for your Medicare Wellness Visit. I appreciate your ongoing commitment to your health goals. Please review the following plan we discussed and let me know if I can assist you in the future.   Referrals/Orders/Follow-Ups/Clinician Recommendations: Aim for 30 minutes of exercise or brisk walking, 6-8 glasses of water, and 5 servings of fruits and vegetables each day.  This is a list of the screening recommended for you and due dates:  Health Maintenance  Topic Date Due   Medicare Annual Wellness Visit  11/02/2024   Colon Cancer Screening  12/06/2029   DTaP/Tdap/Td vaccine (3 - Td or Tdap) 02/23/2033   Pneumonia Vaccine  Completed   Flu Shot  Completed   COVID-19 Vaccine  Completed   Hepatitis C Screening  Completed   Zoster (Shingles) Vaccine  Completed   HPV Vaccine  Aged Out    Advanced directives: (ACP Link)Information on Advanced Care Planning can be found at [facility] Advance Health Care Directives Advance Health Care Directives ([email or web address]   Next Medicare Annual Wellness Visit scheduled for next year: Yes

## 2023-11-03 NOTE — Progress Notes (Signed)
Subjective:   [name] [name] is a 78 y.o. male who presents for Medicare Annual/Subsequent preventive examination.  Visit Complete: Virtual I connected with  Mccrae L Leidy on 11/03/23 by a audio enabled telemedicine application and verified that I am speaking with the correct person using two identifiers.  Patient Location: Home  Provider Location: Home Office  This patient declined Interactive audio and video telecommunications. Therefore the visit was completed with audio only.  I discussed the limitations of evaluation and management by telemedicine. The patient expressed understanding and agreed to proceed.  Vital Signs: Because this visit was a virtual/telehealth visit, some criteria may be missing or patient reported. Any vitals not documented were not able to be obtained and vitals that have been documented are patient reported.  Cardiac Risk Factors include: advanced age (>52men, >95 women);dyslipidemia;hypertension;male gender     Objective:    Today's Vitals   11/03/23 1013  Weight: 260 lb (117.9 kg)  Height: 6\' 2"  (1.88 m)   Body mass index is 33.38 kg/m.     11/03/2023   10:39 AM 10/28/2022    3:18 PM 02/04/2022    8:30 AM 01/28/2022   11:27 AM 12/09/2021    4:20 PM 10/27/2021    1:26 PM 04/25/2021   10:34 AM  Advanced Directives  Does Patient Have a Medical Advance Directive? No No Yes Yes No Yes Yes  Type of Surveyor, minerals;Living will Healthcare Power of [place];Living will  Healthcare Power of [place];Living will Healthcare Power of [place];Living will  Does patient want to make changes to medical advance directive?   No - Patient declined    No - Guardian declined  Copy of Healthcare Power of Attorney in Chart?   No - copy requested   No - copy requested No - copy requested  Would patient like information on creating a medical advance directive? Yes (MAU/Ambulatory/Procedural Areas - Information given) Yes  (MAU/Ambulatory/Procedural Areas - Information given)         Current Medications (verified) Outpatient Encounter Medications as of 11/03/2023  Medication Sig   amLODipine (NORVASC) 5 MG tablet Take 1 tablet (5 mg total) by mouth daily.   aspirin 81 MG chewable tablet Chew 1 tablet (81 mg total) by mouth 2 (two) times daily.   atenolol (TENORMIN) 25 MG tablet Take 1 tablet (25 mg total) by mouth daily.   Cholecalciferol (VITAMIN D3) 25 MCG (1000 UT) CAPS Take 1,000 Units by mouth daily.   citalopram (CELEXA) 20 MG tablet Take 1 tablet (20 mg total) by mouth daily.   cyanocobalamin (VITAMIN B12) 1000 MCG/ML injection Inject 1 mL (1,000 mcg total) into the muscle every 30 (thirty) days.   ezetimibe (ZETIA) 10 MG tablet Take 1 tablet (10 mg total) by mouth daily.   fluticasone-salmeterol (ADVAIR HFA) 230-21 MCG/ACT inhaler Inhale 2 puffs into the lungs 2 (two) times daily.   montelukast (SINGULAIR) 10 MG tablet Take 1 tablet (10 mg total) by mouth daily.   pravastatin (PRAVACHOL) 20 MG tablet Take 0.5 tablets (10 mg total) by mouth daily.   sildenafil (REVATIO) 20 MG tablet Take 20 mg by mouth as needed.   No facility-administered encounter medications on file as of 11/03/2023.    Allergies (verified) Fenofibrate and Rosuvastatin   History: Past Medical History:  Diagnosis Date   Arthritis    Asthma, chronic 06/02/2013   Carpal tunnel syndrome 04/17/2015   Bilateral   COPD (chronic obstructive pulmonary disease) (HCC)  Depression 06/02/2013   DYSPNEA ON EXERTION 06/04/2009   Qualifier: Diagnosis of  By: Diona Browner, MD, Hollace Hayward    Hereditary and idiopathic peripheral neuropathy 04/17/2015   Hyperlipemia 06/02/2013   Hypertension    LEG PAIN, BILATERAL 06/04/2009   Qualifier: Diagnosis of  By: Diona Browner, MD, Hollace Hayward    PERCUTANEOUS TRANSLUMINAL CORONARY ANGIOPLASTY, HX OF 06/04/2009   Qualifier: Diagnosis of  By: Diona Browner, MD, Hollace Hayward     Prostate CA [facility])    and basal cell nose   Past Surgical History:  Procedure Laterality Date   CARDIAC CATHETERIZATION  1997   CARPAL TUNNEL RELEASE Left 05/26/2023   EXCISION MASS NECK Left 11/12/2020   Procedure: EXCISION LEFT POSTERIOR NECK MASS;  Surgeon: Abigail Miyamoto, MD;  Location: [facility];  Service: General;  Laterality: Left;   EXCISION METACARPAL MASS Left 02/04/2022   Procedure: LEFT THUMB EXCISION MASS;  Surgeon: Marlyne Beards, MD;  Location: [facility];  Service: Orthopedics;  Laterality: Left;   EYE SURGERY Bilateral 2020   cataracts removed   HERNIA REPAIR     HIATAL HERNIA REPAIR     LAPAROSCOPIC GASTRIC BANDING  2000   [facility] fundoplication   MENISCUS REPAIR Left 2012   ROBOT ASSISTED LAPAROSCOPIC RADICAL PROSTATECTOMY     ROTATOR CUFF REPAIR Right 2019   TOTAL KNEE ARTHROPLASTY Right 01/03/2021   Procedure: RIGHT TOTAL KNEE ARTHROPLASTY;  Surgeon: Kathryne Hitch, MD;  Location: [facility];  Service: Orthopedics;  Laterality: Right;  Needs RNFA   TOTAL KNEE ARTHROPLASTY Left 04/25/2021   Procedure: LEFT TOTAL KNEE ARTHROPLASTY;  Surgeon: Kathryne Hitch, MD;  Location: [facility];  Service: Orthopedics;  Laterality: Left;   Family History  Problem Relation Age of Onset   Heart disease Other        Early Cardiac disease for uncle is their 72's as well as deceased.   Heart failure Mother    Dementia Mother 44   Cancer Father 58       testicular, lung   Esophageal cancer Brother    Social History   Socioeconomic History   Marital status: Married    Spouse name: [name]   Number of children: 3   Years of education: 18   Highest education level: Master's degree (e.g., MA, MS, MEng, MEd, MSW, MBA)  Occupational History   Occupation: Retired    Comment: Hotel manager  Tobacco Use   Smoking status: Former    Current packs/day: 0.00    Average packs/day: 1 pack/day for 20.0 years (20.0 ttl pk-yrs)    Types:  Cigarettes    Start date: 01/18/1973    Quit date: 01/18/1993    Years since quitting: 30.8   Smokeless tobacco: Never  Vaping Use   Vaping status: Never Used  Substance and Sexual Activity   Alcohol use: Yes    Alcohol/week: 2.0 standard drinks of alcohol    Types: 2 Cans of beer per week    Comment: occasional   Drug use: No   Sexual activity: Yes  Other Topics Concern   Not on file  Social History Narrative   Patient is married and lives at home with is wife.    He has three adult children and five grandchildren.    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    Social Drivers of Corporate investment banker Strain: Low Risk  (11/03/2023)   Overall Financial Resource  Strain (CARDIA)    Difficulty of Paying Living Expenses: Not hard at all  Food Insecurity: No Food Insecurity (11/03/2023)   Hunger Vital Sign    Worried About Running Out of Food in the Last Year: Never true    Ran Out of Food in the Last Year: Never true  Transportation Needs: No Transportation Needs (11/03/2023)   PRAPARE - Administrator, Civil Service (Medical): No    Lack of Transportation (Non-Medical): No  Physical Activity: Sufficiently Active (11/03/2023)   Exercise Vital Sign    Days of Exercise per Week: 5 days    Minutes of Exercise per Session: 30 min  Stress: No Stress Concern Present (11/03/2023)   [employer] of Occupational Health - Occupational Stress Questionnaire    Feeling of Stress : Not at all  Social Connections: Moderately Integrated (11/03/2023)   Social Connection and Isolation Panel [NHANES]    Frequency of Communication with Friends and Family: More than three times a week    Frequency of Social Gatherings with Friends and Family: Three times a week    Attends Religious Services: Never    Active Member of Clubs or Organizations: Yes    Attends Engineer, structural: More than 4 times per year    Marital Status: Married    Tobacco  Counseling Counseling given: Not Answered   Clinical Intake:  Pre-visit preparation completed: Yes  Pain : No/denies pain     Diabetes: No  How often do you need to have someone help you when you read instructions, pamphlets, or other written materials from your doctor or pharmacy?: 1 - Never  Interpreter Needed?: No  Information entered by :: Kandis Fantasia LPN   Activities of Daily Living    11/03/2023   10:14 AM  In your present state of health, do you have any difficulty performing the following activities:  Hearing? 0  Vision? 0  Difficulty concentrating or making decisions? 0  Walking or climbing stairs? 0  Dressing or bathing? 0  Doing errands, shopping? 0  Preparing Food and eating ? N  Using the Toilet? N  In the past six months, have you accidently leaked urine? N  Do you have problems with loss of bowel control? N  Managing your Medications? N  Managing your Finances? N  Housekeeping or managing your Housekeeping? N    Patient Care Team: Bennie Pierini, FNP as PCP - General (Family Medicine) Kathryne Hitch, MD as Consulting Physician (Orthopedic Surgery) Michaelle Copas, MD as Referring Physician (Optometry) Kerrin Champagne, MD (Inactive) as Consulting Physician (Orthopedic Surgery) Bjorn Pippin, MD as Attending Physician (Urology) Meriam Sprague, MD (Inactive) as Consulting Physician (Cardiology) Tyrell Antonio, MD as Consulting Physician (Physical Medicine and Rehabilitation) [facility]  Indicate any recent Medical Services you may have received from other than [facility] providers in the past year (date may be approximate).     Assessment:   This is a routine wellness examination for [place].  Hearing/Vision screen Hearing Screening - Comments:: Denies hearing difficulties   Vision Screening - Comments:: Wears rx glasses - up to date with routine eye exams with [facility]      Goals Addressed             This Visit's Progress    Remain active and independent        Depression Screen    11/03/2023   11:00 AM 06/07/2023  1:53 PM 01/04/2023   10:33 AM 12/03/2022    2:08 PM 10/28/2022    3:16 PM 07/31/2022    9:40 AM 06/04/2022    1:58 PM  PHQ 2/9 Scores  PHQ - 2 Score 0 0  0 0 1 0  PHQ- 9 Score  0  1  2 1   Exception Documentation   Patient refusal        Fall Risk    11/03/2023   10:39 AM 06/07/2023    1:53 PM 12/03/2022    2:08 PM 10/28/2022    3:15 PM 07/31/2022    9:40 AM  Fall Risk   Falls in the past year? 0 0 0 0 0  Number falls in past yr: 0   0   Injury with Fall? 0   0   Risk for fall due to : No Fall Risks      Follow up Falls prevention discussed;Education provided;Falls evaluation completed   Falls prevention discussed;Education provided;Falls evaluation completed     MEDICARE RISK AT HOME: Medicare Risk at Home Any stairs in or around the home?: No If so, are there any without handrails?: No Home free of loose throw rugs in walkways, pet beds, electrical cords, etc?: Yes Adequate lighting in your home to reduce risk of falls?: Yes Life alert?: No Use of a cane, walker or w/c?: No Grab bars in the bathroom?: Yes Shower chair or bench in shower?: Yes Elevated toilet seat or a handicapped toilet?: Yes  TIMED UP AND GO:  Was the test performed?  No    Cognitive Function:    10/20/2018   12:41 PM 10/18/2017   10:52 AM 07/07/2016    9:10 AM  MMSE - Mini Mental State Exam  Orientation to time 5 5 5   Orientation to Place 5 5 5   Registration 3 3 3   Attention/ Calculation 5 5 5   Recall 3 3 3   Language- name 2 objects 2 2 2   Language- repeat 1 1 1   Language- follow 3 step command 3 3 3   Language- read & follow direction 1 1 1   Write a sentence 1 1 1   Copy design 1 0 1  Total score 30 29 30         11/03/2023   10:40 AM 10/28/2022    3:20 PM 10/24/2020    1:23 PM 10/23/2019    1:31 PM  6CIT Screen  What Year? 0 points  0 points 0 points 0 points  What month? 0 points 0 points 0 points 0 points  What time? 0 points 0 points 0 points 0 points  Count back from 20 0 points 0 points 0 points 0 points  Months in reverse 0 points 0 points 0 points 0 points  Repeat phrase 0 points 0 points 0 points 0 points  Total Score 0 points 0 points 0 points 0 points    Immunizations Immunization History  Administered Date(s) Administered   Fluad Quad(high Dose 65+) 06/21/2019, 07/02/2020, 06/18/2021, 06/04/2022   Fluad Trivalent(High Dose 65+) 06/07/2023   Influenza, High Dose Seasonal PF 07/07/2016, 06/15/2017, 07/14/2018   Influenza,inj,Quad PF,6+ Mos 06/27/2013, 06/25/2014, 06/28/2015   Influenza-Unspecified 06/08/2023   Moderna Covid-19 Fall Seasonal Vaccine 36yrs & older 06/24/2023   Moderna Covid-19 Vaccine Bivalent Booster 21yrs & up 07/15/2021   Moderna SARS-COV2 Booster Vaccination 07/11/2020, 01/12/2021   Moderna Sars-Covid-2 Vaccination 10/20/2019, 11/18/2019   Pfizer(Comirnaty)Fall Seasonal Vaccine 12 years and older 07/14/2022   Pneumococcal Conjugate-13 06/28/2015   Pneumococcal Polysaccharide-23  10/27/2011   Respiratory Syncytial Virus Vaccine,Recomb Aduvanted(Arexvy) 06/25/2022, 06/09/2023   Tdap 07/07/2016, 02/24/2023   Zoster Recombinant(Shingrix) 12/09/2017, 02/16/2018    TDAP status: Up to date  Flu Vaccine status: Up to date  Pneumococcal vaccine status: Up to date  Covid-19 vaccine status: Information provided on how to obtain vaccines.   Qualifies for Shingles Vaccine? Yes   Zostavax completed No   Shingrix Completed?: Yes  Screening Tests Health Maintenance  Topic Date Due   Medicare Annual Wellness (AWV)  11/02/2024   Colonoscopy  12/06/2029   DTaP/Tdap/Td (3 - Td or Tdap) 02/23/2033   Pneumonia Vaccine 30+ Years old  Completed   INFLUENZA VACCINE  Completed   COVID-19 Vaccine  Completed   Hepatitis C Screening  Completed   Zoster Vaccines- Shingrix  Completed   HPV  VACCINES  Aged Out    Health Maintenance  There are no preventive care reminders to display for this patient.   Colorectal cancer screening: Type of screening: Colonoscopy. Completed 12/07/19. Repeat every 10 years  Lung Cancer Screening: (Low Dose CT Chest recommended if Age 61-80 years, 20 pack-year currently smoking OR have quit w/in 15years.) does not qualify.   Lung Cancer Screening Referral: n/a  Additional Screening:  Hepatitis C Screening: does qualify; Completed 04/28/18  Vision Screening: Recommended annual ophthalmology exams for early detection of glaucoma and other disorders of the eye. Is the patient up to date with their annual eye exam?  Yes  Who is the provider or what is the name of the office in which the patient attends annual eye exams? [facility] If pt is not established with a provider, would they like to be referred to a provider to establish care? No .   Dental Screening: Recommended annual dental exams for proper oral hygiene  Community Resource Referral / Chronic Care Management: CRR required this visit?  No   CCM required this visit?  No     Plan:     I have personally reviewed and noted the following in the patient's chart:   Medical and social history Use of alcohol, tobacco or illicit drugs  Current medications and supplements including opioid prescriptions. Patient is not currently taking opioid prescriptions. Functional ability and status Nutritional status Physical activity Advanced directives List of other physicians Hospitalizations, surgeries, and ER visits in previous 12 months Vitals Screenings to include cognitive, depression, and falls Referrals and appointments  In addition, I have reviewed and discussed with patient certain preventive protocols, quality metrics, and best practice recommendations. A written personalized care plan for preventive services as well as general preventive health recommendations were provided  to patient.     Kandis Fantasia [place], [place]   12/26/2438   After Visit Summary: (MyChart) Due to this being a telephonic visit, the after visit summary with patients personalized plan was offered to patient via MyChart   Nurse Notes: No concerns at this time

## 2023-11-09 ENCOUNTER — Ambulatory Visit (HOSPITAL_BASED_OUTPATIENT_CLINIC_OR_DEPARTMENT_OTHER): Payer: Medicare Other | Admitting: Pulmonary Disease

## 2023-11-09 ENCOUNTER — Encounter (HOSPITAL_BASED_OUTPATIENT_CLINIC_OR_DEPARTMENT_OTHER): Payer: Self-pay | Admitting: Pulmonary Disease

## 2023-11-09 VITALS — BP 124/68 | HR 63 | Ht 74.0 in | Wt 252.0 lb

## 2023-11-09 DIAGNOSIS — J4489 Other specified chronic obstructive pulmonary disease: Secondary | ICD-10-CM | POA: Diagnosis not present

## 2023-11-09 NOTE — Progress Notes (Signed)
 Subjective:   PATIENT ID: Carollee Leitz GENDER: male DOB: 06/09/1946, MRN: [id number]  Chief Complaint  Patient presents with   Follow-up    COPD/Asthma    Reason for Visit: Shortness of breath  Mr. Lavontae Cornia is a 78 year old male former smoker with COPD, HTN, HLD, hx prostate cancer in remission, basal cell carcinoma s/p excision and palpitations who presents for shortness of breath, COPD/asthma.  Initial consult 08/2022 He was recently seen in Cardiology clinic in December. Note from Dr. Shari Prows on 08/20/22 reviewed. He reported fatigue and shortness of breath at the time. Walking 2000-3000 stpes. Planning for repeat echo and cardiac work-up. Referred to Pulmonary to rule out lung related dyspnea  He reports since his 21s he has reported dyspnea on exertion with running and retired from [employer] at that time. He gets fatigued quickly and has to take deep breaths. Able to walk short distances but inclines are difficult. Reports associated coughing and wheezing sometimes. Cough is usually productive. Only uses symbicort as needed.   09/03/22 - PFTs consistent with COPD-asthma overlap. Baseline symptoms shortness of breath associated with productive cough and wheezing worsened with exertion. Started on Advair 2024 - Resolved cough and wheezing on Advair. DOE remains.  11/30/22 Since our last visit, he was treated with zpack for sinusitis/URI in Feb 2024. Since then has improved. Shortness of breath with heavy exertion and walking uphill. He is using Advair as instructed. Not using rescue inhaler as often. Has not regular exercised but interested restarting running. Denies cough or wheezing.  03/11/23 Since our last visit he had an exacerbation April requiring amoxicillin and prednisone. He developed shortness of breath and productive cough that began three weeks ago. Is feeling better some but having wheezing at night when laying down. Compliant with Advair and using rescue inhaler  3 times a week and tries not to take it often. Wheezing improves after sputum production. Denies fever, chills.  06/09/23 Since our last visit he reports doing well. He reports walking more but still has difficulty walking uphill. No coughing or wheezing. Denies exacerbations since our last visit. Very seldom using albuterol.   11/09/23 Since our last visit he reports URI over Christmas cruise and was confined to their rooms. He stopped Advair HFA due to ineffectiveness and tremors. Has not noticed a difference in shortness of breath. Yesterday able to cut down tree and limbs and loaded. Denies wheezing. Has not used his rescue. Planning to work on a corvette rebuild.  Social History: 1 ppd x 25 years. 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 Spent time overseas in [place] and [place] Sandblasting occasionally  Past Medical History:  Diagnosis Date   Arthritis    Asthma, chronic 06/02/2013   Carpal tunnel syndrome 04/17/2015   Bilateral   COPD (chronic obstructive pulmonary disease) (HCC)    Depression 06/02/2013   DYSPNEA ON EXERTION 06/04/2009   Qualifier: Diagnosis of  By: Diona Browner, MD, Hollace Hayward    Hereditary and idiopathic peripheral neuropathy 04/17/2015   Hyperlipemia 06/02/2013   Hypertension    LEG PAIN, BILATERAL 06/04/2009   Qualifier: Diagnosis of  By: Diona Browner, MD, Hollace Hayward    PERCUTANEOUS TRANSLUMINAL CORONARY ANGIOPLASTY, HX OF 06/04/2009   Qualifier: Diagnosis of  By: Diona Browner, MD, Hollace Hayward    Prostate CA [facility])    and basal cell nose     Family History  Problem Relation Age of Onset   Heart disease Other  Early Cardiac disease for uncle is their 12's as well as deceased.   Heart failure Mother    Dementia Mother 63   Cancer Father 51       testicular, lung   Esophageal cancer Brother      Social History   Occupational History   Occupation: Retired    Comment: Hotel manager  Tobacco Use   Smoking  status: Former    Current packs/day: 0.00    Average packs/day: 1 pack/day for 20.0 years (20.0 ttl pk-yrs)    Types: Cigarettes    Start date: 01/18/1973    Quit date: 01/18/1993    Years since quitting: 30.8   Smokeless tobacco: Never  Vaping Use   Vaping status: Never Used  Substance and Sexual Activity   Alcohol use: Yes    Alcohol/week: 2.0 standard drinks of alcohol    Types: 2 Cans of beer per week    Comment: occasional   Drug use: No   Sexual activity: Yes    Allergies  Allergen Reactions   Fenofibrate Other (See Comments)    Pt reports causes joint pains   Rosuvastatin Other (See Comments)    Pt reports causes joint pains     Outpatient Medications Prior to Visit  Medication Sig Dispense Refill   amLODipine (NORVASC) 5 MG tablet Take 1 tablet (5 mg total) by mouth daily. 90 tablet 1   aspirin 81 MG chewable tablet Chew 1 tablet (81 mg total) by mouth 2 (two) times daily. 30 tablet 0   atenolol (TENORMIN) 25 MG tablet Take 1 tablet (25 mg total) by mouth daily. 90 tablet 1   Cholecalciferol (VITAMIN D3) 25 MCG (1000 UT) CAPS Take 1,000 Units by mouth daily.     citalopram (CELEXA) 20 MG tablet Take 1 tablet (20 mg total) by mouth daily. 90 tablet 1   cyanocobalamin (VITAMIN B12) 1000 MCG/ML injection Inject 1 mL (1,000 mcg total) into the muscle every 30 (thirty) days. 3 mL 0   ezetimibe (ZETIA) 10 MG tablet Take 1 tablet (10 mg total) by mouth daily. 90 tablet 1   fluticasone-salmeterol (ADVAIR HFA) 230-21 MCG/ACT inhaler Inhale 2 puffs into the lungs 2 (two) times daily. 1 each 5   montelukast (SINGULAIR) 10 MG tablet Take 1 tablet (10 mg total) by mouth daily. 90 tablet 1   pravastatin (PRAVACHOL) 20 MG tablet Take 0.5 tablets (10 mg total) by mouth daily. 90 tablet 1   sildenafil (REVATIO) 20 MG tablet Take 100 mg by mouth as needed.     No facility-administered medications prior to visit.    Review of Systems  Constitutional:  Negative for chills, diaphoresis,  fever, malaise/fatigue and weight loss.  HENT:  Negative for congestion.   Respiratory:  Positive for shortness of breath. Negative for cough, hemoptysis, sputum production and wheezing.   Cardiovascular:  Negative for chest pain, palpitations and leg swelling.     Objective:   Vitals:   11/09/23 0942  BP: 124/68  Pulse: 63  SpO2: 97%  Weight: 252 lb (114.3 kg)  Height: 6\' 2"  (1.88 m)   SpO2: 97 %  Physical Exam: General: Well-appearing, no acute distress HENT: [place], AT Eyes: EOMI, no scleral icterus Respiratory: Clear to auscultation bilaterally.  No crackles, wheezing or rales Cardiovascular: RRR, -M/R/G, no JVD Extremities:-Edema,-tenderness Neuro: AAO x4, CNII-XII grossly intact Psych: Normal mood, normal affect  Data Reviewed:  Imaging: CXR 12/03/22 - No acute infiltrate effusion or edema  PFT: 09/03/22 FVC 3.77 (77%) FEV1  2.51 (70%) Ratio 64  TLC 102% RV 157% RV/TLC 145% DLCO 107% Interpretation: Based on FEV1/SVC, mild obstructive defect with significant bronchodilator response. Air trapping present.  Labs: CBC    Component Value Date/Time   WBC 7.4 06/07/2023 1358   WBC 16.2 (H) 04/26/2021 0313   RBC 4.84 06/07/2023 1358   RBC 3.73 (L) 04/26/2021 0313   HGB 12.7 (L) 06/07/2023 1358   HCT 41.0 06/07/2023 1358   PLT 255 06/07/2023 1358   MCV 85 06/07/2023 1358   MCH 26.2 (L) 06/07/2023 1358   MCH 30.8 04/26/2021 0313   MCHC 31.0 (L) 06/07/2023 1358   MCHC 32.5 04/26/2021 0313   RDW 14.0 06/07/2023 1358   LYMPHSABS 2.3 06/07/2023 1358   MONOABS 0.5 10/08/2014 1811   EOSABS 0.2 06/07/2023 1358   BASOSABS 0.0 06/07/2023 1358   Absolute eos 07/31/22 - 200     Assessment & Plan:   Discussion: 78 year old male former smoker with COPD-asthma overlap, HTN, HLD, hx prostate cancer in remission, basal cell carcinoma s/p excision and palpitations who presents for follow-up COPD/asthma. Discussed clinical course and management of COPD/asthma including  bronchodilator regimen, preventive care including vaccinations (UTD on RSV, covid and influenza) and action plan for exacerbation.   COPD-asthma overlap  History tobacco abuse --DECREASE Advair 230-21 mcg ONE puffs in morning and evening. Monitor for tremors.  >Ok to decrease to PRN if adverse symptoms --CONTINUE Albuterol AS NEEDED for shortness of breath, chest tightness or wheezing --CONTINUE singulair 10 mg daily --CONTINUE exercising 3-5 days a week. Please add aerobic activities  Health Maintenance Immunization History  Administered Date(s) Administered   Fluad Quad(high Dose 65+) 06/21/2019, 07/02/2020, 06/18/2021, 06/04/2022   Fluad Trivalent(High Dose 65+) 06/07/2023   Influenza, High Dose Seasonal PF 07/07/2016, 06/15/2017, 07/14/2018   Influenza,inj,Quad PF,6+ Mos 06/27/2013, 06/25/2014, 06/28/2015   Influenza-Unspecified 06/08/2023   Moderna Covid-19 Fall Seasonal Vaccine 24yrs & older 06/24/2023   Moderna Covid-19 Vaccine Bivalent Booster 66yrs & up 07/15/2021   Moderna SARS-COV2 Booster Vaccination 07/11/2020, 01/12/2021   Moderna Sars-Covid-2 Vaccination 10/20/2019, 11/18/2019   Pfizer(Comirnaty)Fall Seasonal Vaccine 12 years and older 07/14/2022   Pneumococcal Conjugate-13 06/28/2015   Pneumococcal Polysaccharide-23 10/27/2011   Respiratory Syncytial Virus Vaccine,Recomb Aduvanted(Arexvy) 06/25/2022, 06/09/2023   Tdap 07/07/2016, 02/24/2023   Zoster Recombinant(Shingrix) 12/09/2017, 02/16/2018   CT Lung Screen - not qualified. Quit smoking > 15 years  No orders of the defined types were placed in this encounter.  No orders of the defined types were placed in this encounter.   Return in about 10 months (around 09/07/2024).   I have spent a total time of 30-minutes on the day of the appointment including chart review, data review, collecting history, coordinating care and discussing medical diagnosis and plan with the patient/family. Past medical history,  allergies, medications were reviewed. Pertinent imaging, labs and tests included in this note have been reviewed and interpreted independently by me.  Calyb Mcquarrie Mechele Collin, MD [facility] 11/09/2023 10:01 AM

## 2023-11-09 NOTE — Patient Instructions (Signed)
 COPD-asthma overlap  History tobacco abuse --DECREASE Advair 230-21 mcg ONE puffs in morning and evening. Monitor for tremors --CONTINUE Albuterol AS NEEDED for shortness of breath, chest tightness or wheezing --CONTINUE singulair 10 mg daily --CONTINUE exercising 3-5 days a week. Please add aerobic activities

## 2023-12-02 ENCOUNTER — Ambulatory Visit (INDEPENDENT_AMBULATORY_CARE_PROVIDER_SITE_OTHER): Payer: Medicare Other | Admitting: Nurse Practitioner

## 2023-12-02 ENCOUNTER — Encounter: Payer: Self-pay | Admitting: Nurse Practitioner

## 2023-12-02 VITALS — BP 138/67 | HR 59 | Temp 97.4°F | Ht 74.0 in | Wt 261.8 lb

## 2023-12-02 DIAGNOSIS — R351 Nocturia: Secondary | ICD-10-CM

## 2023-12-02 DIAGNOSIS — F3342 Major depressive disorder, recurrent, in full remission: Secondary | ICD-10-CM | POA: Diagnosis not present

## 2023-12-02 DIAGNOSIS — J4489 Other specified chronic obstructive pulmonary disease: Secondary | ICD-10-CM | POA: Diagnosis not present

## 2023-12-02 DIAGNOSIS — E782 Mixed hyperlipidemia: Secondary | ICD-10-CM

## 2023-12-02 DIAGNOSIS — J4520 Mild intermittent asthma, uncomplicated: Secondary | ICD-10-CM

## 2023-12-02 DIAGNOSIS — E8881 Metabolic syndrome: Secondary | ICD-10-CM

## 2023-12-02 DIAGNOSIS — I10 Essential (primary) hypertension: Secondary | ICD-10-CM | POA: Diagnosis not present

## 2023-12-02 LAB — BAYER DCA HB A1C WAIVED: HB A1C (BAYER DCA - WAIVED): 5.9 % — ABNORMAL HIGH (ref 4.8–5.6)

## 2023-12-02 MED ORDER — EZETIMIBE 10 MG PO TABS: 10.0000 mg | ORAL_TABLET | Freq: Every day | ORAL | 1 refills | Status: DC

## 2023-12-02 MED ORDER — MONTELUKAST SODIUM 10 MG PO TABS: 10.0000 mg | ORAL_TABLET | Freq: Every day | ORAL | 1 refills | Status: DC

## 2023-12-02 MED ORDER — PRAVASTATIN SODIUM 20 MG PO TABS: 10.0000 mg | ORAL_TABLET | Freq: Every day | ORAL | 1 refills | Status: DC

## 2023-12-02 MED ORDER — ATENOLOL 25 MG PO TABS: 25.0000 mg | ORAL_TABLET | Freq: Every day | ORAL | 1 refills | Status: DC

## 2023-12-02 MED ORDER — AMLODIPINE BESYLATE 5 MG PO TABS: 5.0000 mg | ORAL_TABLET | Freq: Every day | ORAL | 1 refills | Status: DC

## 2023-12-02 MED ORDER — CITALOPRAM HYDROBROMIDE 20 MG PO TABS: 20.0000 mg | ORAL_TABLET | Freq: Every day | ORAL | 1 refills | Status: DC

## 2023-12-02 NOTE — Patient Instructions (Signed)
 GERD in Adults: Diet Changes When you have gastroesophageal reflux disease (GERD), you may need to make changes to your diet. Choosing the right foods can help with your symptoms. Think about working with an expert in healthy eating called a dietitian. They can help you make healthy food choices. What are tips for following this plan? Reading food labels Look for foods that are low in saturated fat. Foods that may help with your symptoms include: Foods with less than 5% of daily value (DV) of fat. Foods with 0 grams of trans fat. Cooking [employer] in ways that don't use a lot of fat. These ways include: Baking. Steaming. Grilling. Broiling. To add flavor, try to use herbs that are low in spice and acidity. Avoid frying your food. Meal planning  Eat small meals often rather than eating 3 large meals each day. Eat your meals slowly in a place where you feel relaxed. If told by your health care provider, avoid: Foods that cause symptoms. Keep a food diary to keep track of foods that cause symptoms. Alcohol. Drinking a lot of liquid with meals. General instructions For 2-3 hours after you eat, avoid: Bending over. Exercise. Lying down. Chew sugar-free gum after meals. What foods should I eat? Eat a healthy diet. Try to include: Foods with high amounts of fiber. These include: Fruits and vegetables. Whole grains and beans. Low-fat dairy products. Lean meats, fish, and poultry. Egg whites. Foods that cause symptoms in someone else may not cause symptoms for you. Work with your provider to find foods that are safe for you. The items listed above may not be all the foods and drinks you can have. Talk with a dietitian to learn more. The items listed above may not be a complete list of foods and beverages you can eat and drink. Contact a dietitian for more information. What foods should I avoid? Limiting some of these foods may help with your symptoms. Each person is different.  Talk with a dietitian or your provider to help you find the exact foods to avoid. Some of the foods to avoid may include: Fruits Fruits with a lot of acid in them. These may include citrus fruits, such as oranges, grapefruit, pineapple, and lemons. Vegetables Deep-fried vegetables, such as [place] fries. Vegetables, sauces, or toppings made with added fat and vegetables with acid in them. These may include tomatoes and tomato products, chili peppers, onions, garlic, and horseradish. Grains Pastries or quick breads with added fat. Meats and other proteins High-fat meats, such as fatty beef or pork, hot dogs, ribs, ham, sausage, salami, and bacon. Fried meat or protein, such as fried fish and fried chicken. Egg yolks. Fats and oils Butter. Margarine. Shortening. Ghee. Drinks Coffee and other drinks with caffeine in them. Fizzy and sugary drinks, such as soda and energy drinks. Fruit juice made with acidic fruits, such as orange or grapefruit. Tomato juice. Sweets and desserts Chocolate and cocoa. Donuts. Seasonings and condiments Mint, such as peppermint and spearmint. Condiments, herbs, or seasonings that cause symptoms. These may include curry, hot sauce, or vinegar-based salad dressings. The items listed above may not be all the foods and drinks you should avoid. Talk with a dietitian to learn more. Questions to ask your health care provider Changes to your diet and everyday life are often the first steps taken to manage symptoms of GERD. If these changes don't help, talk with your provider about taking medicines. Where to find more information International Foundation for Gastrointestinal Disorders:  aboutgerd.org This information is not intended to replace advice given to you by your health care provider. Make sure you discuss any questions you have with your health care provider. Document Revised: 07/13/2023 Document Reviewed: 01/27/2023 Elsevier Patient Education  2024 [employer].

## 2023-12-02 NOTE — Progress Notes (Signed)
 Subjective:    Patient ID: [name] [name], male    DOB: 1946-02-20, 78 y.o.   MRN: [id number]   Chief Complaint: medical management of chronic issues     HPI:  [name] [name] is a 78 y.o. who identifies as a male who was assigned male at birth.   Social history: Lives with: wife Work history: retired   Water engineer in today for follow up of the following chronic medical issues:  1. Essential hypertension No c/o chest pain, sob or headache. Does not check blood pressure at home. BP Readings from Last 3 Encounters:  11/09/23 124/68  08/26/23 (!) 147/63  06/09/23 128/64     2. Mixed hyperlipidemia Doe snot really watch diet and does no dedicated exercise. Pravastatin isthe only med he can tolertae. Lab Results  Component Value Date   CHOL 150 06/07/2023   HDL 43 06/07/2023   LDLCALC 71 06/07/2023   LDLDIRECT 92.8 08/28/2014   TRIG 222 (H) 06/07/2023   CHOLHDL 3.5 06/07/2023   The 10-year ASCVD risk score (Arnett DK, et al., 2019) is: 34.5%   3. COPD with asthma Is currently on symbicort daily. Takes singulair daily. Denies chronic cough.  4. Diverticular disease of colon No recent flare ups since last visit  5. Gastroesophageal reflux disease, unspecified whether esophagitis present Uses OTC meds as needed  6. Hereditary and idiopathic peripheral neuropathy Burning sensation bil feet  7. Recurrent major depressive disorder, in full remission (HCC) Is on celexa and is doing well.    11/03/2023   11:00 AM 06/07/2023    1:53 PM 12/03/2022    2:08 PM  Depression screen PHQ 2/9  Decreased Interest 0 0 0  Down, Depressed, Hopeless 0 0 0  PHQ - 2 Score 0 0 0  Altered sleeping  0 1  Tired, decreased energy  0 0  Change in appetite  0 0  Feeling bad or failure about yourself   0 0  Trouble concentrating  0 0  Moving slowly or fidgety/restless  0 0  Suicidal thoughts  0 0  PHQ-9 Score  0 1  Difficult doing work/chores  Not difficult at all Not difficult at all       8. BMI 28.0-28.9,adult Weight is up 9lbs  Wt Readings from Last 3 Encounters:  12/02/23 261 lb 12.8 oz (118.8 kg)  11/09/23 252 lb (114.3 kg)  11/03/23 260 lb (117.9 kg)   BMI Readings from Last 3 Encounters:  12/02/23 33.61 kg/m  11/09/23 32.35 kg/m  11/03/23 33.38 kg/m       New complaints: Some nocturia  Allergies  Allergen Reactions   Fenofibrate Other (See Comments)    Pt reports causes joint pains   Rosuvastatin Other (See Comments)    Pt reports causes joint pains   Outpatient Encounter Medications as of 12/02/2023  Medication Sig   amLODipine (NORVASC) 5 MG tablet Take 1 tablet (5 mg total) by mouth daily.   aspirin 81 MG chewable tablet Chew 1 tablet (81 mg total) by mouth 2 (two) times daily.   atenolol (TENORMIN) 25 MG tablet Take 1 tablet (25 mg total) by mouth daily.   Cholecalciferol (VITAMIN D3) 25 MCG (1000 UT) CAPS Take 1,000 Units by mouth daily.   citalopram (CELEXA) 20 MG tablet Take 1 tablet (20 mg total) by mouth daily.   cyanocobalamin (VITAMIN B12) 1000 MCG/ML injection Inject 1 mL (1,000 mcg total) into the muscle every 30 (thirty) days.   ezetimibe (ZETIA) 10 MG  tablet Take 1 tablet (10 mg total) by mouth daily.   fluticasone-salmeterol (ADVAIR HFA) 230-21 MCG/ACT inhaler Inhale 2 puffs into the lungs 2 (two) times daily.   montelukast (SINGULAIR) 10 MG tablet Take 1 tablet (10 mg total) by mouth daily.   pravastatin (PRAVACHOL) 20 MG tablet Take 0.5 tablets (10 mg total) by mouth daily.   sildenafil (REVATIO) 20 MG tablet Take 100 mg by mouth as needed.   No facility-administered encounter medications on file as of 12/02/2023.    Past Surgical History:  Procedure Laterality Date   CARDIAC CATHETERIZATION  1997   CARPAL TUNNEL RELEASE Left 05/26/2023   EXCISION MASS NECK Left 11/12/2020   Procedure: EXCISION LEFT POSTERIOR NECK MASS;  Surgeon: Abigail Miyamoto, MD;  Location: [facility];  Service: General;   Laterality: Left;   EXCISION METACARPAL MASS Left 02/04/2022   Procedure: LEFT THUMB EXCISION MASS;  Surgeon: Marlyne Beards, MD;  Location: [facility];  Service: Orthopedics;  Laterality: Left;   EYE SURGERY Bilateral 2020   cataracts removed   HERNIA REPAIR     HIATAL HERNIA REPAIR     LAPAROSCOPIC GASTRIC BANDING  2000   [facility] fundoplication   MENISCUS REPAIR Left 2012   ROBOT ASSISTED LAPAROSCOPIC RADICAL PROSTATECTOMY     ROTATOR CUFF REPAIR Right 2019   TOTAL KNEE ARTHROPLASTY Right 01/03/2021   Procedure: RIGHT TOTAL KNEE ARTHROPLASTY;  Surgeon: Kathryne Hitch, MD;  Location: [facility];  Service: Orthopedics;  Laterality: Right;  Needs RNFA   TOTAL KNEE ARTHROPLASTY Left 04/25/2021   Procedure: LEFT TOTAL KNEE ARTHROPLASTY;  Surgeon: Kathryne Hitch, MD;  Location: [facility];  Service: Orthopedics;  Laterality: Left;    Family History  Problem Relation Age of Onset   Heart disease Other        Early Cardiac disease for uncle is their 62's as well as deceased.   Heart failure Mother    Dementia Mother 76   Cancer Father 2       testicular, lung   Esophageal cancer Brother       Controlled substance contract: n/a     Review of Systems  Constitutional:  Negative for diaphoresis.  Eyes:  Negative for pain.  Respiratory:  Negative for shortness of breath.   Cardiovascular:  Negative for chest pain, palpitations and leg swelling.  Gastrointestinal:  Negative for abdominal pain.  Endocrine: Negative for polydipsia.  Skin:  Negative for rash.  Neurological:  Negative for dizziness, weakness and headaches.  Hematological:  Does not bruise/bleed easily.  All other systems reviewed and are negative.      Objective:   Physical Exam Vitals and nursing note reviewed.  Constitutional:      Appearance: Normal appearance. He is well-developed.  HENT:     Head: Normocephalic.     Nose: Nose normal.     Mouth/Throat:     Mouth: Mucous  membranes are moist.     Pharynx: Oropharynx is clear.  Eyes:     Pupils: Pupils are equal, round, and reactive to light.  Neck:     Thyroid: No thyroid mass or thyromegaly.     Vascular: No carotid bruit or JVD.     Trachea: Phonation normal.  Cardiovascular:     Rate and Rhythm: Normal rate and regular rhythm.  Pulmonary:     Effort: Pulmonary effort is normal. No respiratory distress.     Breath sounds: Normal breath sounds.  Abdominal:     General:  Bowel sounds are normal.     Palpations: Abdomen is soft.     Tenderness: There is no abdominal tenderness.  Musculoskeletal:        General: Normal range of motion.     Cervical back: Normal range of motion and neck supple.  Lymphadenopathy:     Cervical: No cervical adenopathy.  Skin:    General: Skin is warm and dry.  Neurological:     Mental Status: He is alert and oriented to person, place, and time.  Psychiatric:        Behavior: Behavior normal.        Thought Content: Thought content normal.        Judgment: Judgment normal.     BP 138/67   Pulse (!) 59   Temp (!) 97.4 F (36.3 C)   Ht 6\' 2"  (1.88 m)   Wt 261 lb 12.8 oz (118.8 kg)   SpO2 95%   BMI 33.61 kg/m    Hgba1c 6.3%     Assessment & Plan:   [name] [name] comes in today with chief complaint of medical management of chronic issues    Diagnosis and orders addressed:  1. Essential hypertension Low sodium diet - CBC with Differential/Platelet - CMP14+EGFR - amLODipine (NORVASC) 5 MG tablet; Take 1 tablet (5 mg total) by mouth daily.  Dispense: 90 tablet; Refill: 1 - atenolol (TENORMIN) 25 MG tablet; Take 1 tablet (25 mg total) by mouth daily.  Dispense: 90 tablet; Refill: 1  2. Mixed hyperlipidemia Low fat diet - Lipid panel - ezetimibe (ZETIA) 10 MG tablet; Take 1 tablet (10 mg total) by mouth daily.  Dispense: 90 tablet; Refill: 1 - pravastatin (PRAVACHOL) 20 MG tablet; Take 0.5 tablets (10 mg total) by mouth daily.  Dispense: 90 tablet;  Refill: 1  3. COPD with asthma Keep follow up with pulmonology - fluticasone-salmeterol (ADVAIR HFA) 230-21 MCG/ACT inhaler; Inhale 2 puffs into the lungs 2 (two) times daily.  Dispense: 1 each; Refill: 5  4. Diverticular disease of colon Watch diet to prevent flare up  5. Gastroesophageal reflux disease, unspecified whether esophagitis present Avoid spicy foods Do not eat 2 hours prior to bedtime   6. Hereditary and idiopathic peripheral neuropathy Check feet daily  7. Recurrent major depressive disorder, in full remission (HCC) Stress management - citalopram (CELEXA) 20 MG tablet; Take 1 tablet (20 mg total) by mouth daily.  Dispense: 90 tablet; Refill: 1  8. BMI 28.0-28.9,adult Discussed diet and exercise for person with BMI >25 Will recheck weight in 3-6 months   9. Elevated blood sugar - Bayer DCA Hb A1c Waived  10. Mild intermittent chronic asthma without complication - montelukast (SINGULAIR) 10 MG tablet; Take 1 tablet (10 mg total) by mouth daily.  Dispense: 90 tablet; Refill: 1   Labs pending Health Maintenance reviewed Diet and exercise encouraged  Follow up plan: 6 months   Mary-Margaret Daphine Deutscher, FNP

## 2023-12-03 LAB — CBC WITH DIFFERENTIAL/PLATELET
Basophils Absolute: 0.1 10*3/uL (ref 0.0–0.2)
Basos: 1 %
EOS (ABSOLUTE): 0.2 10*3/uL (ref 0.0–0.4)
Eos: 2 %
Hematocrit: 42.1 % (ref 37.5–51.0)
Hemoglobin: 14.2 g/dL (ref 13.0–17.7)
Immature Grans (Abs): 0 10*3/uL (ref 0.0–0.1)
Immature Granulocytes: 0 %
Lymphocytes Absolute: 2.8 10*3/uL (ref 0.7–3.1)
Lymphs: 35 %
MCH: 31.2 pg (ref 26.6–33.0)
MCHC: 33.7 g/dL (ref 31.5–35.7)
MCV: 93 fL (ref 79–97)
Monocytes Absolute: 0.7 10*3/uL (ref 0.1–0.9)
Monocytes: 8 %
Neutrophils Absolute: 4.3 10*3/uL (ref 1.4–7.0)
Neutrophils: 54 %
Platelets: 216 10*3/uL (ref 150–450)
RBC: 4.55 x10E6/uL (ref 4.14–5.80)
RDW: 13.4 % (ref 11.6–15.4)
WBC: 8.1 10*3/uL (ref 3.4–10.8)

## 2023-12-03 LAB — CMP14+EGFR
ALT: 18 IU/L (ref 0–44)
AST: 23 IU/L (ref 0–40)
Albumin: 4.4 g/dL (ref 3.8–4.8)
Alkaline Phosphatase: 79 IU/L (ref 44–121)
BUN/Creatinine Ratio: 12 (ref 10–24)
BUN: 18 mg/dL (ref 8–27)
Bilirubin Total: 0.4 mg/dL (ref 0.0–1.2)
CO2: 23 mmol/L (ref 20–29)
Calcium: 9.9 mg/dL (ref 8.6–10.2)
Chloride: 102 mmol/L (ref 96–106)
Creatinine, Ser: 1.45 mg/dL — ABNORMAL HIGH (ref 0.76–1.27)
Globulin, Total: 2.4 g/dL (ref 1.5–4.5)
Glucose: 88 mg/dL (ref 70–99)
Potassium: 4.7 mmol/L (ref 3.5–5.2)
Sodium: 139 mmol/L (ref 134–144)
Total Protein: 6.8 g/dL (ref 6.0–8.5)
eGFR: 50 mL/min/{1.73_m2} — ABNORMAL LOW (ref >59)

## 2023-12-03 LAB — LIPID PANEL
Chol/HDL Ratio: 4 ratio (ref 0.0–5.0)
Cholesterol, Total: 170 mg/dL (ref 100–199)
HDL: 43 mg/dL (ref >39)
LDL Chol Calc (NIH): 88 mg/dL (ref 0–99)
Triglycerides: 231 mg/dL — ABNORMAL HIGH (ref 0–149)
VLDL Cholesterol Cal: 39 mg/dL (ref 5–40)

## 2023-12-03 LAB — PSA, TOTAL AND FREE
PSA, Free: <0.02 ng/mL
Prostate Specific Ag, Serum: <0.1 ng/mL (ref 0.0–4.0)

## 2023-12-21 ENCOUNTER — Other Ambulatory Visit: Payer: Self-pay | Admitting: Family

## 2024-01-12 ENCOUNTER — Ambulatory Visit: Payer: Self-pay

## 2024-01-12 NOTE — Telephone Encounter (Signed)
 Copied From CRM [id number].  Reason for Triage: The patient's wife has pulled a tick off of the patient's lower back today 01/12/24  The patient spent time outside yesterday but is uncertain of when they may have been bitten but would like to discuss their concerns further when possible    Chief Complaint: Tick bite Symptoms: Redness to bite area  Frequency: Possibly 2 ticks  Pertinent Negatives: Patient denies any symptoms at this time  Disposition: [] ED /[] Urgent Care (no appt availability in office) / [] Appointment(In office/virtual)/ []  [facility]/ [x] Home Care/ [] Refused Recommended Disposition /[] [facility]/ []  Follow-up with PCP Additional Notes: Patient reports that today his wife removed a tick from his back. He states that there is a red dot where it was removed but no spreading or surrounding ring of redness. He states that he has a second bite but is unsure if it was a tick because "I noticed it itched and I scratched it and if there was a tick I must have knocked it off." Patient will monitor the area of the bite and call back for any signs of infection or changes to the sites of the bites, or for any new symptoms.    Reason for Disposition  Unknown type of tick bite with no complications  Answer Assessment - Initial Assessment Questions 1. ATTACHED:  "Is the tick still on the skin?"  (e.g., yes, no, unsure)     No 2. ONSET - TICK STILL ATTACHED:  "How long do you think the tick has been on your skin?" (e.g., hours, days, unsure)  Note:  Is there a recent activity (camping, hiking) where the caller may have been exposed?     No longer attached  3. ONSET - TICK NOT STILL ATTACHED: "If the tick has been removed, how long do you think the tick was attached before you removed it?" (e.g., 5 hours, 2 days). "When was this?"     Unsure  4. LOCATION: "Where is the tick bite located?" (e.g., arm, leg)     On back  5. TYPE of TICK: "Is it a wood tick or a deer tick?"  (e.g., deer tick, wood tick; unsure)     Lone star tick  6. SIZE of TICK: "How big is the tick?" (e.g., size of poppy seed, apple seed, watermelon seed; unsure) Note: Deer ticks can be the size of a poppy seed (nymph) or an apple seed (adult).       Small  7. ENGORGED: "Did the tick look flat or engorged (full, swollen)?" (e.g., flat, engorged; unsure)     Flat  8. OTHER SYMPTOMS: "Do you have any other symptoms?" (e.g., fever, rash, redness at bite area, red ring around bite)     Red dot to one bite area  Protocols used: Tick Bite-A-AH

## 2024-01-13 NOTE — Telephone Encounter (Signed)
 Please call patient about previous response

## 2024-01-13 NOTE — Telephone Encounter (Signed)
 Please call patient and ask if he is feeling ok. If tick was not there more then 48 hours probably does not have any tick borne illness. Ask about area- is it red, does it look like a bulls eye?

## 2024-02-09 ENCOUNTER — Ambulatory Visit (INDEPENDENT_AMBULATORY_CARE_PROVIDER_SITE_OTHER): Admitting: Family Medicine

## 2024-02-09 ENCOUNTER — Encounter: Payer: Self-pay | Admitting: Family Medicine

## 2024-02-09 VITALS — BP 152/61 | HR 58 | Temp 97.8°F | Ht 74.0 in | Wt 255.0 lb

## 2024-02-09 DIAGNOSIS — L03115 Cellulitis of right lower limb: Secondary | ICD-10-CM | POA: Diagnosis not present

## 2024-02-09 DIAGNOSIS — B880 Other acariasis: Secondary | ICD-10-CM | POA: Diagnosis not present

## 2024-02-09 MED ORDER — AMOXICILLIN 875 MG PO TABS: 875.0000 mg | ORAL_TABLET | Freq: Two times a day (BID) | ORAL | 0 refills | Status: AC

## 2024-02-09 MED ORDER — TRIAMCINOLONE ACETONIDE 0.1 % EX CREA: 1.0000 | TOPICAL_CREAM | Freq: Three times a day (TID) | CUTANEOUS | 0 refills | Status: AC

## 2024-02-09 NOTE — Progress Notes (Signed)
 Chief Complaint  Patient presents with   Insect Bite    Right leg     HPI  Patient presents today for bit by chigger 3 weeks ago, but a few days ago redness started to spread from the center. No response to OC cortisone cream  PMH: Smoking status noted Review of Systems  Objective: BP (!) 152/61   Pulse (!) 58   Temp 97.8 F (36.6 C) (Temporal)   Ht 6\' 2"  (1.88 m)   Wt 255 lb (115.7 kg)   SpO2 96%   BMI 32.74 kg/m  Gen: NAD, alert, cooperative with exam HEENT: NCAT, Ext: No edema, warm 9 cm erythema surrounding nidus of 2 mm slough noted, proximal anterior right lower leg. Neuro: Alert and oriented, No gross deficits  Chigger bite -     Triamcinolone  Acetonide; Apply 1 Application topically 3 (three) times daily. Avoid face and genitalia  Dispense: 45 g; Refill: 0  Cellulitis of right leg -     Amoxicillin ; Take 1 tablet (875 mg total) by mouth 2 (two) times daily for 10 days.  Dispense: 20 tablet; Refill: 0   Dressing applied, neosporin & gauze

## 2024-02-14 DIAGNOSIS — S90861A Insect bite (nonvenomous), right foot, initial encounter: Secondary | ICD-10-CM | POA: Diagnosis not present

## 2024-02-14 DIAGNOSIS — L231 Allergic contact dermatitis due to adhesives: Secondary | ICD-10-CM | POA: Diagnosis not present

## 2024-02-14 DIAGNOSIS — L821 Other seborrheic keratosis: Secondary | ICD-10-CM | POA: Diagnosis not present

## 2024-02-14 DIAGNOSIS — L814 Other melanin hyperpigmentation: Secondary | ICD-10-CM | POA: Diagnosis not present

## 2024-02-14 DIAGNOSIS — L579 Skin changes due to chronic exposure to nonionizing radiation, unspecified: Secondary | ICD-10-CM | POA: Diagnosis not present

## 2024-02-14 DIAGNOSIS — Z85828 Personal history of other malignant neoplasm of skin: Secondary | ICD-10-CM | POA: Diagnosis not present

## 2024-02-24 DIAGNOSIS — M79674 Pain in right toe(s): Secondary | ICD-10-CM | POA: Diagnosis not present

## 2024-02-24 DIAGNOSIS — M2041 Other hammer toe(s) (acquired), right foot: Secondary | ICD-10-CM | POA: Diagnosis not present

## 2024-02-27 NOTE — Progress Notes (Unsigned)
 Cardiology Office Note:   Date:  03/01/2024  ID:  [name] [name], DOB [birth year], MRN [id number] PCP: Delfina Feller, FNP  [facility] Providers Cardiologist:  None {  History of Present Illness:   [name] [name] is a 78 y.o. male with a hx of COPD, hyperlipidemia, prior history of prostate cancer now in remission, basal cell carcinoma s/p excision and palpitations who was previously followed by Dr. Nicholette Barley and more recently Dr. Ardell Beauvais.    He was seen in clinic on 11/19/20 prior to right TKA. We obtained myoview  due to limited mobility in the setting of severe arthritis which showed no evidence of ischemia or infarction. He was cleared for his surgery.  In 08/2022 he was having more DOE. TTE showed EF 50-55%, normal GLS, normal RV, trivial AR.  He presents for follow up and for his first appt with me.  He does have a left bundle branch block.  They saw him years ago because they thought there was fibrillation but he has not had any history of this or clear documentation of this that he knows of for that I can find.  He does have a weight problem because he extremity carbohydrates.  However, he stays very active in the yard.  He has no chest pain, neck or arm discomfort.  No shortness of breath, PND or orthopnea.  He has no palpitations, presyncope or syncope.  ROS: As stated in the HPI and negative for all other systems.  Studies Reviewed:    EKG:   EKG Interpretation Date/Time:  Wednesday March 01 2024 12:04:46 EDT Ventricular Rate:  54 PR Interval:  210 QRS Duration:  144 QT Interval:  462 QTC Calculation: 438 R Axis:   -53  Text Interpretation: Sinus bradycardia with 1st degree A-V block Left axis deviation Left bundle branch block When compared with ECG of 08-Nov-2020 09:14, No significant change was found Confirmed by Eilleen Grates ([id number]) on 03/01/2024 12:18:36 PM    Risk Assessment/Calculations:              Physical Exam:   VS:  BP 128/62   Pulse (!) 54    Ht 6' 2 (1.88 m)   Wt 255 lb (115.7 kg)   BMI 32.74 kg/m    Wt Readings from Last 3 Encounters:  03/01/24 255 lb (115.7 kg)  02/09/24 255 lb (115.7 kg)  12/02/23 261 lb 12.8 oz (118.8 kg)     GEN: Well nourished, well developed in no acute distress NECK: No JVD; No carotid bruits CARDIAC: RRR,  no murmurs, rubs, gallops RESPIRATORY:  Clear to auscultation without rales, wheezing or rhonchi  ABDOMEN: Soft, non-tender, non-distended EXTREMITIES:  No edema; No deformity   ASSESSMENT AND PLAN:    LBBB:   The patient has no new symptoms related to this.  He has had no bradycardic symptoms.  No change in therapy.  DOE: He has had a workup to include a TTE in January 2024 with no significant valvular abnormalities and well-preserved ejection fraction.  He has had a stress perfusion study in 2022.  He has no new symptoms.  He thinks his chronic dyspnea is his COPD.  No change in therapy.   Palpitations : He is actually not experiencing any palpitations.  He wore a monitor in the past and had no documented A-fib.  He will continue the meds as listed.     HTN: The blood pressure is controlled above.  No change in therapy.   HLD:  LDL  was 88 with an HDL of 43.  No change in therapy.    Obesity: We had a discussion about limiting carbohydrates.     Follow up with me in 1 year  Signed, Eilleen Grates, MD

## 2024-02-28 DIAGNOSIS — R002 Palpitations: Secondary | ICD-10-CM | POA: Insufficient documentation

## 2024-02-28 DIAGNOSIS — R0602 Shortness of breath: Secondary | ICD-10-CM | POA: Insufficient documentation

## 2024-03-01 ENCOUNTER — Encounter: Payer: Self-pay | Admitting: Cardiology

## 2024-03-01 ENCOUNTER — Ambulatory Visit (INDEPENDENT_AMBULATORY_CARE_PROVIDER_SITE_OTHER): Payer: Medicare Other | Admitting: Cardiology

## 2024-03-01 VITALS — BP 128/62 | HR 54 | Ht 74.0 in | Wt 255.0 lb

## 2024-03-01 DIAGNOSIS — R0602 Shortness of breath: Secondary | ICD-10-CM

## 2024-03-01 DIAGNOSIS — E785 Hyperlipidemia, unspecified: Secondary | ICD-10-CM | POA: Diagnosis not present

## 2024-03-01 DIAGNOSIS — R002 Palpitations: Secondary | ICD-10-CM

## 2024-03-01 DIAGNOSIS — I10 Essential (primary) hypertension: Secondary | ICD-10-CM

## 2024-03-01 NOTE — Patient Instructions (Signed)
 Medication Instructions:  Your physician recommends that you continue on your current medications as directed. Please refer to the Current Medication list given to you today.  *If you need a refill on your cardiac medications before your next appointment, please call your pharmacy*  Lab Work: NONE   If you have labs (blood work) drawn today and your tests are completely normal, you will receive your results only by: MyChart Message (if you have MyChart) OR A paper copy in the mail If you have any lab test that is abnormal or we need to change your treatment, we will call you to review the results.  Testing/Procedures: NONE   Follow-Up: At [facility], you and your health needs are our priority.  As part of our continuing mission to provide you with exceptional heart care, our providers are all part of one team.  This team includes your primary Cardiologist (physician) and Advanced Practice Providers or APPs (Physician Assistants and Nurse Practitioners) who all work together to provide you with the care you need, when you need it.  Your next appointment:   1 year(s)  Provider:   Eilleen Grates, MD    We recommend signing up for the patient portal called "MyChart".  Sign up information is provided on this After Visit Summary.  MyChart is used to connect with patients for Virtual Visits (Telemedicine).  Patients are able to view lab/test results, encounter notes, upcoming appointments, etc.  Non-urgent messages can be sent to your provider as well.   To learn more about what you can do with MyChart, go to [email or web address].   Other Instructions Thank you for choosing [facility]!

## 2024-03-06 ENCOUNTER — Ambulatory Visit: Admitting: Nurse Practitioner

## 2024-03-14 ENCOUNTER — Ambulatory Visit (INDEPENDENT_AMBULATORY_CARE_PROVIDER_SITE_OTHER): Admitting: Nurse Practitioner

## 2024-03-14 ENCOUNTER — Encounter: Payer: Self-pay | Admitting: Nurse Practitioner

## 2024-03-14 VITALS — BP 123/62 | HR 55 | Temp 97.9°F | Ht 74.0 in | Wt 253.0 lb

## 2024-03-14 DIAGNOSIS — G609 Hereditary and idiopathic neuropathy, unspecified: Secondary | ICD-10-CM | POA: Diagnosis not present

## 2024-03-14 DIAGNOSIS — E782 Mixed hyperlipidemia: Secondary | ICD-10-CM | POA: Diagnosis not present

## 2024-03-14 DIAGNOSIS — K219 Gastro-esophageal reflux disease without esophagitis: Secondary | ICD-10-CM | POA: Diagnosis not present

## 2024-03-14 DIAGNOSIS — E8881 Metabolic syndrome: Secondary | ICD-10-CM

## 2024-03-14 DIAGNOSIS — I10 Essential (primary) hypertension: Secondary | ICD-10-CM | POA: Diagnosis not present

## 2024-03-14 DIAGNOSIS — Z6828 Body mass index (BMI) 28.0-28.9, adult: Secondary | ICD-10-CM

## 2024-03-14 DIAGNOSIS — F3342 Major depressive disorder, recurrent, in full remission: Secondary | ICD-10-CM | POA: Diagnosis not present

## 2024-03-14 DIAGNOSIS — R7309 Other abnormal glucose: Secondary | ICD-10-CM | POA: Diagnosis not present

## 2024-03-14 DIAGNOSIS — K5730 Diverticulosis of large intestine without perforation or abscess without bleeding: Secondary | ICD-10-CM

## 2024-03-14 DIAGNOSIS — J4489 Other specified chronic obstructive pulmonary disease: Secondary | ICD-10-CM

## 2024-03-14 LAB — LIPID PANEL

## 2024-03-14 LAB — BAYER DCA HB A1C WAIVED: HB A1C (BAYER DCA - WAIVED): 6.4 % — ABNORMAL HIGH (ref 4.8–5.6)

## 2024-03-14 MED ORDER — PRAVASTATIN SODIUM 20 MG PO TABS: 10.0000 mg | ORAL_TABLET | Freq: Every day | ORAL | 1 refills | Status: DC

## 2024-03-14 MED ORDER — EZETIMIBE 10 MG PO TABS: 10.0000 mg | ORAL_TABLET | Freq: Every day | ORAL | 1 refills | Status: DC

## 2024-03-14 MED ORDER — FLUTICASONE-SALMETEROL 230-21 MCG/ACT IN AERO: 2.0000 | INHALATION_SPRAY | Freq: Two times a day (BID) | RESPIRATORY_TRACT | 5 refills | Status: DC

## 2024-03-14 MED ORDER — CITALOPRAM HYDROBROMIDE 20 MG PO TABS: 20.0000 mg | ORAL_TABLET | Freq: Every day | ORAL | 1 refills | Status: DC

## 2024-03-14 MED ORDER — AMLODIPINE BESYLATE 5 MG PO TABS: 5.0000 mg | ORAL_TABLET | Freq: Every day | ORAL | 1 refills | Status: DC

## 2024-03-14 MED ORDER — ALBUTEROL SULFATE HFA 108 (90 BASE) MCG/ACT IN AERS: 2.0000 | INHALATION_SPRAY | Freq: Four times a day (QID) | RESPIRATORY_TRACT | 1 refills | Status: AC | PRN

## 2024-03-14 MED ORDER — NAPROXEN 500 MG PO TABS: 500.0000 mg | ORAL_TABLET | Freq: Two times a day (BID) | ORAL | 5 refills | Status: DC

## 2024-03-14 MED ORDER — ATENOLOL 25 MG PO TABS: 25.0000 mg | ORAL_TABLET | Freq: Every day | ORAL | 1 refills | Status: DC

## 2024-03-14 MED ORDER — BUDESONIDE-FORMOTEROL FUMARATE 160-4.5 MCG/ACT IN AERO: 2.0000 | INHALATION_SPRAY | Freq: Two times a day (BID) | RESPIRATORY_TRACT | 3 refills | Status: DC

## 2024-03-14 NOTE — Progress Notes (Signed)
 Subjective:    Patient ID: [name] [name], male    DOB: [birth year], 78 y.o.   MRN: [id number]   Chief Complaint: medical management of chronic issues     HPI:  [name] [name] is a 78 y.o. who identifies as a male who was assigned male at birth.   Social history: Lives with: wife Work history: retired   Water engineer in today for follow up of the following chronic medical issues:  1. Essential hypertension No c/o chest pain, sob or headache. Does not check blood pressure at home. BP Readings from Last 3 Encounters:  03/01/24 128/62  02/09/24 (!) 152/61  12/02/23 138/67     2. Mixed hyperlipidemia Doe snot really watch diet and does no dedicated exercise. Pravastatin  isthe only med he can tolertae. Lab Results  Component Value Date   CHOL 170 12/02/2023   HDL 43 12/02/2023   LDLCALC 88 12/02/2023   LDLDIRECT 92.8 08/28/2014   TRIG 231 (H) 12/02/2023   CHOLHDL 4.0 12/02/2023   The 10-year ASCVD risk score (Arnett DK, et al., 2019) is: 31.8%   3. COPD with asthma Is currently on symbicort  daily. Takes singulair  daily. Denies chronic cough.  4. Diverticular disease of colon No recent flare ups since last visit  5. Gastroesophageal reflux disease, unspecified whether esophagitis present Uses OTC meds as needed  6. Hereditary and idiopathic peripheral neuropathy Burning sensation bil feet  7. Recurrent major depressive disorder, in full remission (HCC) Is on celexa  and is doing well.    02/09/2024    9:55 AM 11/03/2023   11:00 AM 06/07/2023    1:53 PM  Depression screen PHQ 2/9  Decreased Interest 0 0 0  Down, Depressed, Hopeless 0 0 0  PHQ - 2 Score 0 0 0  Altered sleeping 0  0  Tired, decreased energy 0  0  Change in appetite 0  0  Feeling bad or failure about yourself  0  0  Trouble concentrating 0  0  Moving slowly or fidgety/restless 0  0  Suicidal thoughts 0  0  PHQ-9 Score 0  0  Difficult doing work/chores Not difficult at all  Not difficult at all       8. BMI 28.0-28.9,adult Weight is up 9lbs  Wt Readings from Last 3 Encounters:  03/01/24 255 lb (115.7 kg)  02/09/24 255 lb (115.7 kg)  12/02/23 261 lb 12.8 oz (118.8 kg)   BMI Readings from Last 3 Encounters:  03/01/24 32.74 kg/m  02/09/24 32.74 kg/m  12/02/23 33.61 kg/m       New complaints: Some nocturia  Allergies  Allergen Reactions   Fenofibrate  Other (See Comments)    Pt reports causes joint pains   Rosuvastatin  Other (See Comments)    Pt reports causes joint pains   Outpatient Encounter Medications as of 03/14/2024  Medication Sig   amLODipine  (NORVASC ) 5 MG tablet Take 1 tablet (5 mg total) by mouth daily.   aspirin  81 MG chewable tablet Chew 1 tablet (81 mg total) by mouth 2 (two) times daily. (Patient taking differently: Chew 81 mg by mouth daily.)   atenolol  (TENORMIN ) 25 MG tablet Take 1 tablet (25 mg total) by mouth daily.   Cholecalciferol  (VITAMIN D3) 25 MCG (1000 UT) CAPS Take 1,000 Units by mouth daily.   citalopram  (CELEXA ) 20 MG tablet Take 1 tablet (20 mg total) by mouth daily.   cyanocobalamin  (VITAMIN B12) 1000 MCG/ML injection Inject 1 mL (1,000 mcg total) into the muscle every  30 (thirty) days.   ezetimibe  (ZETIA ) 10 MG tablet Take 1 tablet (10 mg total) by mouth daily.   fluticasone -salmeterol (ADVAIR HFA) 230-21 MCG/ACT inhaler Inhale 2 puffs into the lungs 2 (two) times daily.   montelukast  (SINGULAIR ) 10 MG tablet Take 1 tablet (10 mg total) by mouth daily.   pravastatin  (PRAVACHOL ) 20 MG tablet Take 0.5 tablets (10 mg total) by mouth daily.   sildenafil  (REVATIO ) 20 MG tablet Take 100 mg by mouth as needed.   triamcinolone  cream (KENALOG ) 0.1 % Apply 1 Application topically 3 (three) times daily. Avoid face and genitalia (Patient taking differently: Apply 1 Application topically 2 (two) times daily as needed. Avoid face and genitalia)   No facility-administered encounter medications on file as of 03/14/2024.    Past Surgical History:   Procedure Laterality Date   CARDIAC CATHETERIZATION  1997   CARPAL TUNNEL RELEASE Left 05/26/2023   CARPAL TUNNEL RELEASE Right 07/26/2023   EXCISION MASS NECK Left 11/12/2020   Procedure: EXCISION LEFT POSTERIOR NECK MASS;  Surgeon: Vernetta Berg, MD;  Location: [facility];  Service: General;  Laterality: Left;   EXCISION METACARPAL MASS Left 02/04/2022   Procedure: LEFT THUMB EXCISION MASS;  Surgeon: Romona Harari, MD;  Location: [facility];  Service: Orthopedics;  Laterality: Left;   EYE SURGERY Bilateral 2020   cataracts removed   HERNIA REPAIR     HIATAL HERNIA REPAIR     LAPAROSCOPIC GASTRIC BANDING  2000   [facility] fundoplication   MENISCUS REPAIR Left 2012   ROBOT ASSISTED LAPAROSCOPIC RADICAL PROSTATECTOMY     ROTATOR CUFF REPAIR Right 2019   TOTAL KNEE ARTHROPLASTY Right 01/03/2021   Procedure: RIGHT TOTAL KNEE ARTHROPLASTY;  Surgeon: Vernetta Lonni GRADE, MD;  Location: [facility];  Service: Orthopedics;  Laterality: Right;  Needs RNFA   TOTAL KNEE ARTHROPLASTY Left 04/25/2021   Procedure: LEFT TOTAL KNEE ARTHROPLASTY;  Surgeon: Vernetta Lonni GRADE, MD;  Location: [facility];  Service: Orthopedics;  Laterality: Left;    Family History  Problem Relation Age of Onset   Heart disease Other        Early Cardiac disease for uncle is their 66's as well as deceased.   Heart failure Mother    Dementia Mother 79   Cancer Father 35       testicular, lung   Esophageal cancer Brother       Controlled substance contract: n/a     Review of Systems  Constitutional:  Negative for diaphoresis.  Eyes:  Negative for pain.  Respiratory:  Negative for shortness of breath.   Cardiovascular:  Negative for chest pain, palpitations and leg swelling.  Gastrointestinal:  Negative for abdominal pain.  Endocrine: Negative for polydipsia.  Skin:  Negative for rash.  Neurological:  Negative for dizziness, weakness and headaches.  Hematological:  Does  not bruise/bleed easily.  All other systems reviewed and are negative.      Objective:   Physical Exam Vitals and nursing note reviewed.  Constitutional:      Appearance: Normal appearance. He is well-developed.  HENT:     Head: Normocephalic.     Nose: Nose normal.     Mouth/Throat:     Mouth: Mucous membranes are moist.     Pharynx: Oropharynx is clear.   Eyes:     Pupils: Pupils are equal, round, and reactive to light.   Neck:     Thyroid : No thyroid  mass or thyromegaly.     Vascular: No carotid  bruit or JVD.     Trachea: Phonation normal.   Cardiovascular:     Rate and Rhythm: Normal rate and regular rhythm.  Pulmonary:     Effort: Pulmonary effort is normal. No respiratory distress.     Breath sounds: Normal breath sounds.  Abdominal:     General: Bowel sounds are normal.     Palpations: Abdomen is soft.     Tenderness: There is no abdominal tenderness.   Musculoskeletal:        General: Normal range of motion.     Cervical back: Normal range of motion and neck supple.  Lymphadenopathy:     Cervical: No cervical adenopathy.   Skin:    General: Skin is warm and dry.   Neurological:     Mental Status: He is alert and oriented to person, place, and time.   Psychiatric:        Behavior: Behavior normal.        Thought Content: Thought content normal.        Judgment: Judgment normal.     BP 123/62   Pulse (!) 55   Temp 97.9 F (36.6 C) (Temporal)   Ht 6' 2 (1.88 m)   Wt 253 lb (114.8 kg)   SpO2 93%   BMI 32.48 kg/m     Hgba1c 6.3%     Assessment & Plan:   KYRELL RUACHO comes in today with chief complaint of medical management of chronic issues    Diagnosis and orders addressed:  1. Essential hypertension Low sodium diet - CBC with Differential/Platelet - CMP14+EGFR - amLODipine  (NORVASC ) 5 MG tablet; Take 1 tablet (5 mg total) by mouth daily.  Dispense: 90 tablet; Refill: 1 - atenolol  (TENORMIN ) 25 MG tablet; Take 1 tablet (25 mg  total) by mouth daily.  Dispense: 90 tablet; Refill: 1  2. Mixed hyperlipidemia Low fat diet - Lipid panel - ezetimibe  (ZETIA ) 10 MG tablet; Take 1 tablet (10 mg total) by mouth daily.  Dispense: 90 tablet; Refill: 1 - pravastatin  (PRAVACHOL ) 20 MG tablet; Take 0.5 tablets (10 mg total) by mouth daily.  Dispense: 90 tablet; Refill: 1  3. COPD with asthma Keep follow up with pulmonology Use symbicort  daily Albuterol as rescue inhaler Symbicort  180/4.5 2 puffs daily  4. Diverticular disease of colon Watch diet to prevent flare up  5. Gastroesophageal reflux disease, unspecified whether esophagitis present Avoid spicy foods Do not eat 2 hours prior to bedtime   6. Hereditary and idiopathic peripheral neuropathy Check feet daily  7. Recurrent major depressive disorder, in full remission (HCC) Stress management - citalopram  (CELEXA ) 20 MG tablet; Take 1 tablet (20 mg total) by mouth daily.  Dispense: 90 tablet; Refill: 1  8. BMI 28.0-28.9,adult Discussed diet and exercise for person with BMI >25 Will recheck weight in 3-6 months   9. Elevated blood sugar - Bayer DCA Hb A1c Waived  10. Mild intermittent chronic asthma without complication - montelukast  (SINGULAIR ) 10 MG tablet; Take 1 tablet (10 mg total) by mouth daily.  Dispense: 90 tablet; Refill: 1   Labs pending Health Maintenance reviewed Diet and exercise encouraged  Follow up plan: 6 months   Mary-Margaret Gladis, FNP

## 2024-03-15 LAB — CBC WITH DIFFERENTIAL/PLATELET
Basophils Absolute: 0.1 x10E3/uL (ref 0.0–0.2)
Basos: 1 %
EOS (ABSOLUTE): 0.2 10*3/uL (ref 0.0–0.4)
Eos: 2 %
Hematocrit: 44.9 % (ref 37.5–51.0)
Hemoglobin: 14.7 g/dL (ref 13.0–17.7)
Immature Grans (Abs): 0.1 10*3/uL (ref 0.0–0.1)
Immature Granulocytes: 1 %
Lymphocytes Absolute: 4.6 10*3/uL — ABNORMAL HIGH (ref 0.7–3.1)
Lymphs: 46 %
MCH: 31.3 pg (ref 26.6–33.0)
MCHC: 32.7 g/dL (ref 31.5–35.7)
MCV: 96 fL (ref 79–97)
Monocytes Absolute: 0.7 x10E3/uL (ref 0.1–0.9)
Monocytes: 7 %
Neutrophils Absolute: 4.2 10*3/uL (ref 1.4–7.0)
Neutrophils: 43 %
Platelets: 222 10*3/uL (ref 150–450)
RBC: 4.69 x10E6/uL (ref 4.14–5.80)
RDW: 13.2 % (ref 11.6–15.4)
WBC: 9.8 10*3/uL (ref 3.4–10.8)

## 2024-03-15 LAB — CMP14+EGFR
ALT: 16 IU/L (ref 0–44)
AST: 19 IU/L (ref 0–40)
Albumin: 4.4 g/dL (ref 3.8–4.8)
Alkaline Phosphatase: 92 IU/L (ref 44–121)
BUN/Creatinine Ratio: 19 (ref 10–24)
BUN: 26 mg/dL (ref 8–27)
Bilirubin Total: 0.3 mg/dL (ref 0.0–1.2)
CO2: 20 mmol/L (ref 20–29)
Calcium: 9.6 mg/dL (ref 8.6–10.2)
Chloride: 100 mmol/L (ref 96–106)
Creatinine, Ser: 1.34 mg/dL — ABNORMAL HIGH (ref 0.76–1.27)
Globulin, Total: 2.5 g/dL (ref 1.5–4.5)
Glucose: 72 mg/dL (ref 70–99)
Potassium: 4.9 mmol/L (ref 3.5–5.2)
Sodium: 138 mmol/L (ref 134–144)
Total Protein: 6.9 g/dL (ref 6.0–8.5)
eGFR: 55 mL/min/1.73 — ABNORMAL LOW (ref >59)

## 2024-03-15 LAB — LIPID PANEL
Chol/HDL Ratio: 3.6 ratio (ref 0.0–5.0)
Cholesterol, Total: 153 mg/dL (ref 100–199)
HDL: 43 mg/dL (ref >39)
LDL Chol Calc (NIH): 82 mg/dL (ref 0–99)
Triglycerides: 159 mg/dL — AB (ref 0–149)
VLDL Cholesterol Cal: 28 mg/dL (ref 5–40)

## 2024-03-16 ENCOUNTER — Ambulatory Visit: Payer: Self-pay | Admitting: Nurse Practitioner

## 2024-03-24 ENCOUNTER — Other Ambulatory Visit: Payer: Self-pay | Admitting: Nurse Practitioner

## 2024-04-10 ENCOUNTER — Telehealth: Payer: Self-pay

## 2024-04-10 ENCOUNTER — Other Ambulatory Visit: Payer: Self-pay | Admitting: Physical Medicine and Rehabilitation

## 2024-04-10 DIAGNOSIS — M5416 Radiculopathy, lumbar region: Secondary | ICD-10-CM

## 2024-04-10 DIAGNOSIS — M48062 Spinal stenosis, lumbar region with neurogenic claudication: Secondary | ICD-10-CM

## 2024-04-10 NOTE — Telephone Encounter (Signed)
 Requesting another Injection was 1/25 it lasted 6 months, 75%-80%. Same pain as normal, mid back. No falls or trauma. Current pain scale 8

## 2024-04-17 ENCOUNTER — Other Ambulatory Visit: Payer: Self-pay | Admitting: Physical Medicine and Rehabilitation

## 2024-04-17 ENCOUNTER — Encounter: Payer: Self-pay | Admitting: Physical Medicine and Rehabilitation

## 2024-04-17 MED ORDER — TRAMADOL HCL 50 MG PO TABS
50.0000 mg | ORAL_TABLET | Freq: Three times a day (TID) | ORAL | 0 refills | Status: AC | PRN
Start: 2024-04-17 — End: ?

## 2024-04-27 ENCOUNTER — Other Ambulatory Visit: Payer: Self-pay

## 2024-04-27 ENCOUNTER — Ambulatory Visit (INDEPENDENT_AMBULATORY_CARE_PROVIDER_SITE_OTHER): Admitting: Physical Medicine and Rehabilitation

## 2024-04-27 VITALS — BP 141/70 | HR 53

## 2024-04-27 DIAGNOSIS — M5416 Radiculopathy, lumbar region: Secondary | ICD-10-CM

## 2024-04-27 MED ORDER — METHYLPREDNISOLONE ACETATE 40 MG/ML IJ SUSP
40.0000 mg | Freq: Once | INTRAMUSCULAR | Status: AC
  Administered 2024-04-27: 40 mg

## 2024-04-27 NOTE — Progress Notes (Unsigned)
 Pain Scale   Average Pain 3 Patient advising he has lower back pain radiating to both legs when walking and standing, pain decreases when sitting and resting        +Driver, -BT, -Dye Allergies.

## 2024-04-27 NOTE — Patient Instructions (Signed)

## 2024-05-03 NOTE — Progress Notes (Signed)
 [place] RO[place] - 78 y.o. male MRN [id number]  Date of birth: 10-11-1945  Office Visit Note: Visit Date: 04/27/2024 PCP: Gladis Mustard, FNP Referred by: Gladis, Mary-Margaret, *  Subjective: Chief Complaint  Patient presents with   Lower Back - Pain   HPI:  [name] [name] is a 78 y.o. male who comes in today for planned repeat Right L4 and Left L5 Lumbar Transforaminal epidural steroid injection with fluoroscopic guidance.  The patient has failed conservative care including home exercise, medications, time and activity modification.  This injection will be diagnostic and hopefully therapeutic.  Please see requesting physician notes for further details and justification. Patient received more than 50% pain relief from prior injection.   Referring: Duwaine Pouch, FNP   ROS Otherwise per HPI.  Assessment & Plan: Visit Diagnoses:    ICD-10-CM   1. Lumbar radiculopathy  M54.16 XR C-ARM NO REPORT    Epidural Steroid injection    methylPREDNISolone  acetate (DEPO-MEDROL ) injection 40 mg      Plan: No additional findings.   Meds & Orders:  Meds ordered this encounter  Medications   methylPREDNISolone  acetate (DEPO-MEDROL ) injection 40 mg    Orders Placed This Encounter  Procedures   XR C-ARM NO REPORT   Epidural Steroid injection    Follow-up: Return for visit to requesting provider as needed.   Procedures: No procedures performed  Lumbosacral Transforaminal Epidural Steroid Injection - Sub-Pedicular Approach with Fluoroscopic Guidance  Patient: [name] [name]      Date of Birth: 1945/11/01 MRN: [id number] PCP: Gladis Mustard, FNP      Visit Date: 04/27/2024   Universal Protocol:    Date/Time: 04/27/2024  Consent Given By: the patient  Position: PRONE  Additional Comments: Vital signs were monitored before and after the procedure. Patient was prepped and draped in the usual sterile fashion. The correct patient, procedure, and site was  verified.   Injection Procedure Details:   Procedure diagnoses: Lumbar radiculopathy [M54.16]    Meds Administered:  Meds ordered this encounter  Medications   methylPREDNISolone  acetate (DEPO-MEDROL ) injection 40 mg    Laterality: Left, Right  Location/Site: L5, L4  Needle:5.0 in., 22 ga.  Short bevel or Quincke spinal needle  Needle Placement: Transforaminal  Findings:    -Comments: Excellent flow of contrast along the nerve, nerve root and into the epidural space.  Procedure Details: After squaring off the end-plates to get a true AP view, the C-arm was positioned so that an oblique view of the foramen as noted above was visualized. The target area is just inferior to the nose of the scotty dog or sub pedicular. The soft tissues overlying this structure were infiltrated with 2-3 ml. of 1% Lidocaine  without Epinephrine .  The spinal needle was inserted toward the target using a trajectory view along the fluoroscope beam.  Under AP and lateral visualization, the needle was advanced so it did not puncture dura and was located close the 6 O'Clock position of the pedical in AP tracterory. Biplanar projections were used to confirm position. Aspiration was confirmed to be negative for CSF and/or blood. A 1-2 ml. volume of Isovue -250 was injected and flow of contrast was noted at each level. Radiographs were obtained for documentation purposes.   After attaining the desired flow of contrast documented above, a 0.5 to 1.0 ml test dose of 0.25% Marcaine  was injected into each respective transforaminal space.  The patient was observed for 90 seconds post injection.  After no sensory deficits were reported, and  normal lower extremity motor function was noted,   the above injectate was administered so that equal amounts of the injectate were placed at each foramen (level) into the transforaminal epidural space.   Additional Comments:  The patient tolerated the procedure well Dressing: 2 x  2 sterile gauze and Band-Aid    Post-procedure details: Patient was observed during the procedure. Post-procedure instructions were reviewed.  Patient left the clinic in stable condition.    Clinical History: MRI LUMBAR SPINE WITHOUT AND WITH CONTRAST    TECHNIQUE:  Multiplanar and multiecho pulse sequences of the lumbar spine were  obtained without and with intravenous contrast.    CONTRAST:  20mL MULTIHANCE  GADOBENATE DIMEGLUMINE  529 MG/ML IV SOLN    COMPARISON:  07/21/2020    FINDINGS:  Segmentation: 5 lumbar type vertebrae based on prior    Alignment:  Grade 1 anterolisthesis at L4-5, mildly progressed.    Vertebrae:  No fracture, evidence of discitis, or bone lesion.    Conus medullaris and cauda equina: Conus extends to the T12-L1  level. Conus and cauda equina appear normal.    Paraspinal and other soft tissues: Lower pole renal cyst on the  left.    Disc levels:    T12- L1: Spondylosis with ventral spur that is likely bridging    L1-L2: Fairly bulky ventral spondylitic spur.  No impingement    L2-L3: Ventral spondylitic spurring.  Mild disc bulging.    L3-L4: Disc narrowing and bulging with ventral spurring. Bilateral  facet spurring and ligamentum flavum thickening. Anterior and medial  synovial cyst from the left facet measuring 5 mm. No neural  compression.    L4-L5: Advanced facet degeneration with spurring and  anterolisthesis. Bilobed anterior synovial cyst measuring 14 mm with  compression of the right L4 nerve root. Patent left foramen.  Moderate spinal stenosis    L5-S1:Facet osteoarthritis with asymmetric bulky spurring. Eccentric  right disc bulging and far-lateral spurring. Moderate right  foraminal stenosis.    IMPRESSION:  1. Generalized lumbar spine degeneration with progression from 2021.  2. L4-5 severe facet osteoarthritis with anterolisthesis and 14 mm  right synovial cyst that compresses the right L4 nerve root.  Moderate spinal  stenosis at this level.  3. L3-4 left facet synovial cyst which is new but noncompressive.      Electronically Signed    By: Dorn Roulette M.D.    On: 10/28/2021 19:27   04/17/2015 EMG/NCS IMPRESSION:   Nerve conduction studies done on all 4 extremities shows evidence of mild bilateral carpal tunnel syndrome. There is evidence of motor and sensory impairment on nerve conduction studies on both lower extremities consistent with the diagnosis of peripheral neuropathy. EMG evaluation of the left lower extremity does show mild distal chronic denervation consistent with the diagnosis of peripheral neuropathy. No clear evidence of an overlying lumbosacral radiculopathy is seen.   KYM Francis Bull MD 04/17/2015 11:26 AM   [facility] [address] [place], [place] [address]     Objective:  VS:  HT:    WT:   BMI:     BP:(!) 141/70  HR:(!) 53bpm  TEMP: ( )  RESP:  Physical Exam Vitals and nursing note reviewed.  Constitutional:      General: He is not in acute distress.    Appearance: Normal appearance. He is not ill-appearing.  HENT:     Head: Normocephalic and atraumatic.     Right Ear: External ear normal.     Left Ear:  External ear normal.     Nose: No congestion.  Eyes:     Extraocular Movements: Extraocular movements intact.  Cardiovascular:     Rate and Rhythm: Normal rate.     Pulses: Normal pulses.  Pulmonary:     Effort: Pulmonary effort is normal. No respiratory distress.  Abdominal:     General: There is no distension.     Palpations: Abdomen is soft.  Musculoskeletal:        General: No tenderness or signs of injury.     Cervical back: Neck supple.     Right lower leg: No edema.     Left lower leg: No edema.     Comments: Patient has good distal strength without clonus.  Skin:    Findings: No erythema or rash.  Neurological:     General: No focal deficit present.     Mental Status: He is alert and oriented to person, place,  and time.     Sensory: No sensory deficit.     Motor: No weakness or abnormal muscle tone.     Coordination: Coordination normal.  Psychiatric:        Mood and Affect: Mood normal.        Behavior: Behavior normal.      Imaging: No results found.

## 2024-05-03 NOTE — Procedures (Signed)
 Lumbosacral Transforaminal Epidural Steroid Injection - Sub-Pedicular Approach with Fluoroscopic Guidance  Patient: [name] [name]      Date of Birth: Aug 25, 1946 MRN: [id number] PCP: Gladis Mustard, FNP      Visit Date: 04/27/2024   Universal Protocol:    Date/Time: 04/27/2024  Consent Given By: the patient  Position: PRONE  Additional Comments: Vital signs were monitored before and after the procedure. Patient was prepped and draped in the usual sterile fashion. The correct patient, procedure, and site was verified.   Injection Procedure Details:   Procedure diagnoses: Lumbar radiculopathy [M54.16]    Meds Administered:  Meds ordered this encounter  Medications   methylPREDNISolone  acetate (DEPO-MEDROL ) injection 40 mg    Laterality: Left, Right  Location/Site: L5, L4  Needle:5.0 in., 22 ga.  Short bevel or Quincke spinal needle  Needle Placement: Transforaminal  Findings:    -Comments: Excellent flow of contrast along the nerve, nerve root and into the epidural space.  Procedure Details: After squaring off the end-plates to get a true AP view, the C-arm was positioned so that an oblique view of the foramen as noted above was visualized. The target area is just inferior to the nose of the scotty dog or sub pedicular. The soft tissues overlying this structure were infiltrated with 2-3 ml. of 1% Lidocaine  without Epinephrine .  The spinal needle was inserted toward the target using a trajectory view along the fluoroscope beam.  Under AP and lateral visualization, the needle was advanced so it did not puncture dura and was located close the 6 O'Clock position of the pedical in AP tracterory. Biplanar projections were used to confirm position. Aspiration was confirmed to be negative for CSF and/or blood. A 1-2 ml. volume of Isovue -250 was injected and flow of contrast was noted at each level. Radiographs were obtained for documentation purposes.   After attaining  the desired flow of contrast documented above, a 0.5 to 1.0 ml test dose of 0.25% Marcaine  was injected into each respective transforaminal space.  The patient was observed for 90 seconds post injection.  After no sensory deficits were reported, and normal lower extremity motor function was noted,   the above injectate was administered so that equal amounts of the injectate were placed at each foramen (level) into the transforaminal epidural space.   Additional Comments:  The patient tolerated the procedure well Dressing: 2 x 2 sterile gauze and Band-Aid    Post-procedure details: Patient was observed during the procedure. Post-procedure instructions were reviewed.  Patient left the clinic in stable condition.

## 2024-05-09 ENCOUNTER — Encounter: Admitting: Physical Medicine and Rehabilitation

## 2024-06-05 ENCOUNTER — Ambulatory Visit: Payer: Self-pay

## 2024-06-05 NOTE — Telephone Encounter (Signed)
 FYI Only or Action Required?: FYI only for provider.  Patient was last seen in primary care on 03/14/2024 by Gladis Mustard, FNP.  Called Nurse Triage reporting Diarrhea, moderate dizziness.  Symptoms began several days ago.  Interventions attempted: Nothing.  Symptoms are: gradually worsening.  Triage Disposition: Go to ED Now (or PCP Triage)  Patient/caregiver understands and will follow disposition?: Yes        Copied from CRM #[id number]. Topic: Clinical - Red Word Triage >> Jun 05, 2024  2:22 PM Carlatta H wrote: [facility] that prompted transfer to Nurse Triage: Dizziness and diarrhea Reason for Disposition  SEVERE dizziness (e.g., unable to stand, requires support to walk, feels like passing out now)  Answer Assessment - Initial Assessment Questions 1. DIARRHEA SEVERITY: How bad is the diarrhea? How many more stools have you had in the past 24 hours than normal?      *No Answer* 2. ONSET: When did the diarrhea begin?      Yes  3. STOOL DESCRIPTION:  How loose or watery is the diarrhea? What is the stool color? Is there any blood or mucous in the stool?     *No Answer* 4. VOMITING: Are you also vomiting? If Yes, ask: How many times in the past 24 hours?      *No Answer* 5. ABDOMEN PAIN: Are you having any abdomen pain? If Yes, ask: What does it feel like? (e.g., crampy, dull, intermittent, constant)      *No Answer* 6. ABDOMEN PAIN SEVERITY: If present, ask: How bad is the pain?  (e.g., Scale 1-10; mild, moderate, or severe)     *No Answer* 7. ORAL INTAKE: If vomiting, Have you been able to drink liquids? How much liquids have you had in the past 24 hours?     *No Answer* 8. HYDRATION: Any signs of dehydration? (e.g., dry mouth [not just dry lips], too weak to stand, dizziness, new weight loss) When did you last urinate?     Dizziness (cannot sit up) having to support  9. EXPOSURE: Have you traveled to a foreign country recently?  Have you been exposed to anyone with diarrhea? Could you have eaten any food that was spoiled?     *No Answer* 10. ANTIBIOTIC USE: Are you taking antibiotics now or have you taken antibiotics in the past 2 months?       *No Answer* 11. OTHER SYMPTOMS: Do you have any other symptoms? (e.g., fever, blood in stool)       *No Answer* 12. PREGNANCY: Is there any chance you are pregnant? When was your last menstrual period?       *No Answer*  Answer Assessment - Initial Assessment Questions 1. DESCRIPTION: Describe your dizziness.     Cannot sit up - very unbalanced required support to walk  2. LIGHTHEADED: Do you feel lightheaded? (e.g., somewhat faint, woozy, weak upon standing)     yes  4. SEVERITY: How bad is it?  Do you feel like you are going to faint? Can you stand and walk?     Yes yes but no without some assitance  6. AGGRAVATING FACTORS: Does anything make it worse? (e.g., standing, change in head position)     Sitting up 8. CAUSE: What do you think is causing the dizziness? (e.g., decreased fluids or food, diarrhea, emotional distress, heat exposure, new medicine, sudden standing, vomiting; unknown)     Had had recent diarrhea  10. OTHER SYMPTOMS: Do you have any other symptoms? (e.g., fever, chest pain,  vomiting, diarrhea, bleeding)       no 11. PREGNANCY: Is there any chance you are pregnant? When was your last menstrual period?       N/a  Protocols used: Diarrhea-A-AH, Dizziness - Lightheadedness-A-AH

## 2024-06-08 ENCOUNTER — Ambulatory Visit (INDEPENDENT_AMBULATORY_CARE_PROVIDER_SITE_OTHER): Admitting: Nurse Practitioner

## 2024-06-08 ENCOUNTER — Encounter: Payer: Self-pay | Admitting: Nurse Practitioner

## 2024-06-08 VITALS — BP 153/65 | HR 53 | Temp 98.0°F | Ht 74.0 in | Wt 246.0 lb

## 2024-06-08 DIAGNOSIS — R42 Dizziness and giddiness: Secondary | ICD-10-CM | POA: Diagnosis not present

## 2024-06-08 MED ORDER — MECLIZINE HCL 25 MG PO TABS: 25.0000 mg | ORAL_TABLET | Freq: Three times a day (TID) | ORAL | 0 refills | Status: AC | PRN

## 2024-06-08 MED ORDER — METHYLPREDNISOLONE ACETATE 80 MG/ML IJ SUSP
80.0000 mg | Freq: Once | INTRAMUSCULAR | Status: AC
  Administered 2024-06-08: 80 mg via INTRAMUSCULAR

## 2024-06-08 NOTE — Patient Instructions (Signed)
 Vertigo Vertigo is the feeling that you or the things around you are moving or spinning when they're not. It's different than feeling dizzy. It can also cause: Loss of balance. Trouble standing or walking. Nausea and vomiting. This feeling can come and go at any time. It can last from a few seconds to minutes or even hours. It may go away on its own or be treated with medicine. What are the types of vertigo? There are two types of vertigo: Peripheral vertigo happens when parts of your inner ear don't work like they should. This is the more common type. Central vertigo happens when your brain and spinal cord don't work like they should. Your health care provider will do tests to find out what kind of vertigo you have. This will help them decide on the right treatment for you. Follow these instructions at home: Eating and drinking Drink enough fluid to keep your pee (urine) pale yellow. Do not drink alcohol. Activity When you get up in the morning, first sit up on the side of the bed. When you feel okay, stand slowly while holding onto something. Move slowly. Avoid sudden body or head movements. Avoid certain positions, as told by your provider. Use a cane if you have trouble standing or walking. Sit down right away if you feel unsteady. Place items in your home so they're easy for you to reach without bending or leaning over. Return to normal activities when you're told. Ask what things are safe for you to do. General instructions Take your medicines only as told by your provider. Contact a health care provider if: Your medicines don't help or make your vertigo worse. You get new symptoms. You have a fever. You have nausea or vomiting. Your family or friends spot any changes in how you're acting. A part of your body goes numb. You feel tingling and prickling in a part of your body. You get very bad headaches. Get help right away if: You're always dizzy or you faint. You have a  stiff neck. You have trouble moving or speaking. Your hands, arms, or legs feel weak. Your hearing or eyesight changes. These symptoms may be an emergency. Call 911 right away. Do not wait to see if the symptoms will go away. Do not drive yourself to the hospital. This information is not intended to replace advice given to you by your health care provider. Make sure you discuss any questions you have with your health care provider. Document Revised: 06/03/2023 Document Reviewed: 12/04/2022 Elsevier Patient Education  2024 [employer].

## 2024-06-08 NOTE — Progress Notes (Signed)
   Subjective:    Patient ID: EPHRAM KORNEGAY, male    DOB: [birth year], 78 y.o.   MRN: [id number]   Chief Complaint: Dizziness (Since Monday. Better as the week has went on)   Dizziness Associated symptoms include congestion. Pertinent negatives include no chills, coughing or fever.   Patient in today c/o dizziness. Started last Monday. Happens when he gets up and walks. Feels like he is going to fall. Patient Active Problem List   Diagnosis Date Noted   SOB (shortness of breath) 02/28/2024   Palpitations 02/28/2024   Subcutaneous nodule of left thumb    Distal interphalangeal nodule 01/20/2022   Status post total left knee replacement 04/25/2021   Status post total right knee replacement 01/03/2021   Diverticular disease of colon 09/25/2020   Statin intolerance 12/27/2018   BMI 28.0-28.9,adult 11/01/2018   H/O arthroscopy of shoulder 02/10/2018   Primary osteoarthritis of left knee 11/25/2016   Primary osteoarthritis of right knee 11/25/2016   Essential hypertension 06/28/2015   H/O prostate cancer 06/28/2015   Hereditary and idiopathic peripheral neuropathy 04/17/2015   Carpal tunnel syndrome 04/17/2015   Hyperlipemia 06/02/2013   COPD with asthma (HCC) 06/02/2013   Depression 06/02/2013   GERD 06/04/2009   PERCUTANEOUS TRANSLUMINAL CORONARY ANGIOPLASTY, HX OF 06/04/2009       Review of Systems  Constitutional:  Negative for chills and fever.  HENT:  Positive for congestion. Negative for ear pain, sinus pressure, sinus pain and sneezing.   Respiratory:  Negative for cough and shortness of breath.   Neurological:  Positive for dizziness.       Objective:   Physical Exam Constitutional:      Appearance: Normal appearance.  Cardiovascular:     Rate and Rhythm: Normal rate and regular rhythm.     Heart sounds: Normal heart sounds.  Pulmonary:     Effort: Pulmonary effort is normal.     Breath sounds: Normal breath sounds.  Skin:    General: Skin is warm.   Neurological:     General: No focal deficit present.     Mental Status: He is alert and oriented to person, place, and time.  Psychiatric:        Mood and Affect: Mood normal.        Behavior: Behavior normal.    BP (!) 153/65   Pulse (!) 53   Temp 98 F (36.7 C) (Temporal)   Ht 6' 2 (1.88 m)   Wt 246 lb (111.6 kg)   SpO2 95%   BMI 31.58 kg/m         Assessment & Plan:   AZURE BARRALES in today with chief complaint of Dizziness (Since Monday. Better as the week has went on)   1. Vertigo (Primary) Force fluids Rise slowly from sitting to standing RTO prn - methylPREDNISolone  acetate (DEPO-MEDROL ) injection 80 mg - meclizine  (ANTIVERT ) 25 MG tablet; Take 1 tablet (25 mg total) by mouth 3 (three) times daily as needed.  Dispense: 30 tablet; Refill: 0    The above assessment and management plan was discussed with the patient. The patient verbalized understanding of and has agreed to the management plan. Patient is aware to call the clinic if symptoms persist or worsen. Patient is aware when to return to the clinic for a follow-up visit. Patient educated on when it is appropriate to go to the emergency department.   Mary-Margaret Gladis, FNP

## 2024-07-10 ENCOUNTER — Other Ambulatory Visit: Payer: Self-pay | Admitting: Nurse Practitioner

## 2024-07-10 DIAGNOSIS — J4520 Mild intermittent asthma, uncomplicated: Secondary | ICD-10-CM

## 2024-07-15 ENCOUNTER — Other Ambulatory Visit: Payer: Self-pay | Admitting: *Deleted

## 2024-07-15 DIAGNOSIS — J4489 Other specified chronic obstructive pulmonary disease: Secondary | ICD-10-CM

## 2024-07-17 ENCOUNTER — Encounter: Payer: Self-pay | Admitting: Radiology

## 2024-09-11 ENCOUNTER — Ambulatory Visit (INDEPENDENT_AMBULATORY_CARE_PROVIDER_SITE_OTHER): Payer: Self-pay | Admitting: Nurse Practitioner

## 2024-09-11 ENCOUNTER — Encounter: Payer: Self-pay | Admitting: Nurse Practitioner

## 2024-09-11 VITALS — BP 121/55 | HR 55 | Temp 97.6°F | Ht 74.0 in | Wt 246.0 lb

## 2024-09-11 DIAGNOSIS — K219 Gastro-esophageal reflux disease without esophagitis: Secondary | ICD-10-CM

## 2024-09-11 DIAGNOSIS — Z6828 Body mass index (BMI) 28.0-28.9, adult: Secondary | ICD-10-CM | POA: Diagnosis not present

## 2024-09-11 DIAGNOSIS — I10 Essential (primary) hypertension: Secondary | ICD-10-CM

## 2024-09-11 DIAGNOSIS — G609 Hereditary and idiopathic neuropathy, unspecified: Secondary | ICD-10-CM | POA: Diagnosis not present

## 2024-09-11 DIAGNOSIS — J4520 Mild intermittent asthma, uncomplicated: Secondary | ICD-10-CM

## 2024-09-11 DIAGNOSIS — F3342 Major depressive disorder, recurrent, in full remission: Secondary | ICD-10-CM | POA: Diagnosis not present

## 2024-09-11 DIAGNOSIS — R251 Tremor, unspecified: Secondary | ICD-10-CM | POA: Diagnosis not present

## 2024-09-11 DIAGNOSIS — K5730 Diverticulosis of large intestine without perforation or abscess without bleeding: Secondary | ICD-10-CM

## 2024-09-11 DIAGNOSIS — E8881 Metabolic syndrome: Secondary | ICD-10-CM | POA: Insufficient documentation

## 2024-09-11 DIAGNOSIS — J4489 Other specified chronic obstructive pulmonary disease: Secondary | ICD-10-CM

## 2024-09-11 DIAGNOSIS — E782 Mixed hyperlipidemia: Secondary | ICD-10-CM | POA: Diagnosis not present

## 2024-09-11 LAB — LIPID PANEL

## 2024-09-11 LAB — BAYER DCA HB A1C WAIVED: HB A1C (BAYER DCA - WAIVED): 5.8 % — ABNORMAL HIGH (ref 4.8–5.6)

## 2024-09-11 MED ORDER — BUDESONIDE-FORMOTEROL FUMARATE 160-4.5 MCG/ACT IN AERO: 2.0000 | INHALATION_SPRAY | Freq: Two times a day (BID) | RESPIRATORY_TRACT | 1 refills | Status: AC

## 2024-09-11 MED ORDER — CITALOPRAM HYDROBROMIDE 20 MG PO TABS: 20.0000 mg | ORAL_TABLET | Freq: Every day | ORAL | 1 refills | Status: AC

## 2024-09-11 MED ORDER — AMLODIPINE BESYLATE 5 MG PO TABS: 5.0000 mg | ORAL_TABLET | Freq: Every day | ORAL | 1 refills | Status: AC

## 2024-09-11 MED ORDER — PRAVASTATIN SODIUM 20 MG PO TABS: 10.0000 mg | ORAL_TABLET | Freq: Every day | ORAL | 1 refills | Status: DC

## 2024-09-11 MED ORDER — ATENOLOL 25 MG PO TABS: 25.0000 mg | ORAL_TABLET | Freq: Every day | ORAL | 1 refills | Status: AC

## 2024-09-11 MED ORDER — MONTELUKAST SODIUM 10 MG PO TABS: 10.0000 mg | ORAL_TABLET | Freq: Every day | ORAL | 1 refills | Status: AC

## 2024-09-11 MED ORDER — EZETIMIBE 10 MG PO TABS: 10.0000 mg | ORAL_TABLET | Freq: Every day | ORAL | 1 refills | Status: AC

## 2024-09-11 NOTE — Progress Notes (Signed)
 "  Subjective:    Patient ID: [name] [name], male    DOB: 01/26/1946, 78 y.o.   MRN: [id number]   Chief Complaint: medical management of chronic issues     HPI:  [name] [name] is a 78 y.o. who identifies as a male who was assigned male at birth.   Social history: Lives with: wife Work history: retired   Water Engineer in today for follow up of the following chronic medical issues:  1. Essential hypertension No c/o chest pain, sob or headache. Does not check blood pressure at home. BP Readings from Last 3 Encounters:  06/08/24 (!) 153/65  04/27/24 (!) 141/70  03/14/24 123/62     2. Mixed hyperlipidemia Does not really watch diet and does no dedicated exercise. Pravastatin  is the only med he can tolerate. Has not been taking zetia . Lab Results  Component Value Date   CHOL 153 03/14/2024   HDL 43 03/14/2024   LDLCALC 82 03/14/2024   LDLDIRECT 92.8 08/28/2014   TRIG 159 (H) 03/14/2024   CHOLHDL 3.6 03/14/2024   The 10-year ASCVD risk score (Arnett DK, et al., 2019) is: 42.3%   3. COPD with asthma Is currently on symbicort  daily. Takes singulair  daily. Denies chronic cough.  4. Diverticular disease of colon No recent flare ups since last visit  5. Gastroesophageal reflux disease, unspecified whether esophagitis present Uses OTC meds as needed  6. Hereditary and idiopathic peripheral neuropathy Burning sensation bil feet- unchanged  7. Recurrent major depressive disorder, in full remission (HCC) Is on celexa  and is doing well.    09/11/2024    2:00 PM 06/08/2024    9:07 AM 03/14/2024   10:58 AM  Depression screen PHQ 2/9  Decreased Interest 0 0 0  Down, Depressed, Hopeless 0 0 0  PHQ - 2 Score 0 0 0  Altered sleeping 0 0   Tired, decreased energy 0 0   Change in appetite 0 0   Feeling bad or failure about yourself  0 0   Trouble concentrating 0 0   Moving slowly or fidgety/restless 0 0   Suicidal thoughts 0 0   PHQ-9 Score 0 0    Difficult doing work/chores  Not difficult at all Not difficult at all      Data saved with a previous flowsheet row definition        8. BMI 28.0-28.9,adult Weight is unchanged Wt Readings from Last 3 Encounters:  09/11/24 246 lb (111.6 kg)  06/08/24 246 lb (111.6 kg)  03/14/24 253 lb (114.8 kg)   BMI Readings from Last 3 Encounters:  09/11/24 31.58 kg/m  06/08/24 31.58 kg/m  03/14/24 32.48 kg/m          New complaints: Right hand has been shaking with purposeful movement. Was trying to open something the other day and his right arm starting raising up and he c0uld not stop it.   Allergies  Allergen Reactions   Fenofibrate  Other (See Comments)    Pt reports causes joint pains   Rosuvastatin  Other (See Comments)    Pt reports causes joint pains   Outpatient Encounter Medications as of 09/11/2024  Medication Sig   albuterol  (VENTOLIN  HFA) 108 (90 Base) MCG/ACT inhaler Inhale 2 puffs into the lungs every 6 (six) hours as needed for wheezing or shortness of breath.   amLODipine  (NORVASC ) 5 MG tablet Take 1 tablet (5 mg total) by mouth daily.   aspirin  81 MG chewable tablet Chew 1 tablet (81 mg total) by mouth  2 (two) times daily. (Patient taking differently: Chew 81 mg by mouth daily.)   atenolol  (TENORMIN ) 25 MG tablet Take 1 tablet (25 mg total) by mouth daily.   budesonide -formoterol  (SYMBICORT ) 160-4.5 MCG/ACT inhaler INHALE TWO PUFFS TWICE DAILY   Cholecalciferol  (VITAMIN D3) 25 MCG (1000 UT) CAPS Take 1,000 Units by mouth daily.   citalopram  (CELEXA ) 20 MG tablet Take 1 tablet (20 mg total) by mouth daily.   cyanocobalamin  (VITAMIN B12) 1000 MCG/ML injection Inject 1 mL (1,000 mcg total) into the muscle every 30 (thirty) days.   ezetimibe  (ZETIA ) 10 MG tablet Take 1 tablet (10 mg total) by mouth daily.   meclizine  (ANTIVERT ) 25 MG tablet Take 1 tablet (25 mg total) by mouth 3 (three) times daily as needed.   montelukast  (SINGULAIR ) 10 MG tablet TAKE ONE TABLET BY MOUTH DAILY   naproxen   (NAPROSYN ) 500 MG tablet Take 1 tablet (500 mg total) by mouth 2 (two) times daily with a meal.   pravastatin  (PRAVACHOL ) 20 MG tablet Take 0.5 tablets (10 mg total) by mouth daily.   sildenafil  (VIAGRA ) 100 MG tablet Take 100 mg by mouth daily as needed for erectile dysfunction.   traMADol  (ULTRAM ) 50 MG tablet Take 1 tablet (50 mg total) by mouth every 8 (eight) hours as needed.   triamcinolone  cream (KENALOG ) 0.1 % Apply 1 Application topically 3 (three) times daily. Avoid face and genitalia (Patient taking differently: Apply 1 Application topically 2 (two) times daily as needed. Avoid face and genitalia)   No facility-administered encounter medications on file as of 09/11/2024.    Past Surgical History:  Procedure Laterality Date   CARDIAC CATHETERIZATION  1997   CARPAL TUNNEL RELEASE Left 05/26/2023   CARPAL TUNNEL RELEASE Right 07/26/2023   EXCISION MASS NECK Left 11/12/2020   Procedure: EXCISION LEFT POSTERIOR NECK MASS;  Surgeon: Vernetta Berg, MD;  Location: [facility];  Service: General;  Laterality: Left;   EXCISION METACARPAL MASS Left 02/04/2022   Procedure: LEFT THUMB EXCISION MASS;  Surgeon: Romona Harari, MD;  Location: [facility];  Service: Orthopedics;  Laterality: Left;   EYE SURGERY Bilateral 2020   cataracts removed   HERNIA REPAIR     HIATAL HERNIA REPAIR     LAPAROSCOPIC GASTRIC BANDING  2000   [facility] fundoplication   MENISCUS REPAIR Left 2012   ROBOT ASSISTED LAPAROSCOPIC RADICAL PROSTATECTOMY     ROTATOR CUFF REPAIR Right 2019   TOTAL KNEE ARTHROPLASTY Right 01/03/2021   Procedure: RIGHT TOTAL KNEE ARTHROPLASTY;  Surgeon: Vernetta Lonni GRADE, MD;  Location: [facility];  Service: Orthopedics;  Laterality: Right;  Needs RNFA   TOTAL KNEE ARTHROPLASTY Left 04/25/2021   Procedure: LEFT TOTAL KNEE ARTHROPLASTY;  Surgeon: Vernetta Lonni GRADE, MD;  Location: [facility];  Service: Orthopedics;  Laterality: Left;    Family History   Problem Relation Age of Onset   Heart disease Other        Early Cardiac disease for uncle is their 66's as well as deceased.   Heart failure Mother    Dementia Mother 33   Cancer Father 59       testicular, lung   Esophageal cancer Brother       Controlled substance contract: n/a     Review of Systems  Constitutional:  Negative for diaphoresis.  Eyes:  Negative for pain.  Respiratory:  Negative for shortness of breath.   Cardiovascular:  Negative for chest pain, palpitations and leg swelling.  Gastrointestinal:  Negative for  abdominal pain.  Endocrine: Negative for polydipsia.  Skin:  Negative for rash.  Neurological:  Negative for dizziness, weakness and headaches.  Hematological:  Does not bruise/bleed easily.  All other systems reviewed and are negative.      Objective:   Physical Exam Vitals and nursing note reviewed.  Constitutional:      Appearance: Normal appearance. He is well-developed.  HENT:     Head: Normocephalic.     Nose: Nose normal.     Mouth/Throat:     Mouth: Mucous membranes are moist.     Pharynx: Oropharynx is clear.  Eyes:     Pupils: Pupils are equal, round, and reactive to light.  Neck:     Thyroid : No thyroid  mass or thyromegaly.     Vascular: No carotid bruit or JVD.     Trachea: Phonation normal.  Cardiovascular:     Rate and Rhythm: Normal rate and regular rhythm.  Pulmonary:     Effort: Pulmonary effort is normal. No respiratory distress.     Breath sounds: Normal breath sounds.  Abdominal:     General: Bowel sounds are normal.     Palpations: Abdomen is soft.     Tenderness: There is no abdominal tenderness.  Musculoskeletal:        General: Normal range of motion.     Cervical back: Normal range of motion and neck supple.  Lymphadenopathy:     Cervical: No cervical adenopathy.  Skin:    General: Skin is warm and dry.  Neurological:     Mental Status: He is alert and oriented to person, place, and time.  Psychiatric:         Behavior: Behavior normal.        Thought Content: Thought content normal.        Judgment: Judgment normal.     BP (!) 121/55   Pulse (!) 55   Temp 97.6 F (36.4 C) (Temporal)   Ht 6' 2 (1.88 m)   Wt 246 lb (111.6 kg)   SpO2 96%   BMI 31.58 kg/m      Hgba1c 5.8%     Assessment & Plan:   [name] [name] comes in today with chief complaint of medical management of chronic issues    Diagnosis and orders addressed:  1. Essential hypertension Low sodium diet - CBC with Differential/Platelet - CMP14+EGFR - amLODipine  (NORVASC ) 5 MG tablet; Take 1 tablet (5 mg total) by mouth daily.  Dispense: 90 tablet; Refill: 1 - atenolol  (TENORMIN ) 25 MG tablet; Take 1 tablet (25 mg total) by mouth daily.  Dispense: 90 tablet; Refill: 1  2. Mixed hyperlipidemia Low fat diet - Lipid panel - ezetimibe  (ZETIA ) 10 MG tablet; Take 1 tablet (10 mg total) by mouth daily.  Dispense: 90 tablet; Refill: 1 - pravastatin  (PRAVACHOL ) 20 MG tablet; Take 0.5 tablets (10 mg total) by mouth daily.  Dispense: 90 tablet; Refill: 1  3. COPD with asthma Keep follow up with pulmonology Use symbicort  daily Albuterol  as rescue inhaler Symbicort  180/4.5 2 puffs daily  4. Diverticular disease of colon Watch diet to prevent flare up  5. Gastroesophageal reflux disease, unspecified whether esophagitis present Avoid spicy foods Do not eat 2 hours prior to bedtime   6. Hereditary and idiopathic peripheral neuropathy Check feet daily  7. Recurrent major depressive disorder, in full remission (HCC) Stress management - citalopram  (CELEXA ) 20 MG tablet; Take 1 tablet (20 mg total) by mouth daily.  Dispense: 90 tablet; Refill: 1  8. BMI 28.0-28.9,adult Discussed diet and exercise for person with BMI >25 Will recheck weight in 3-6 months   9. Elevated blood sugar - Bayer DCA Hb A1c Waived  10. Mild intermittent chronic asthma without complication - montelukast  (SINGULAIR ) 10 MG tablet; Take 1  tablet (10 mg total) by mouth daily.  Dispense: 90 tablet; Refill: 1  11. Tremor Referral to neurology  Labs pending Health Maintenance reviewed Diet and exercise encouraged  Follow up plan: 6 months   Mary-Margaret Gladis, FNP  "

## 2024-09-11 NOTE — Patient Instructions (Signed)
 Tremor A tremor is trembling or shaking that you cannot control. Most tremors affect the hands or arms. Tremors can also affect the head, vocal cords, face, and other parts of the body. There are many types of tremors. Common types include: Essential tremor. These usually occur in people older than 40. This type of tremor may run in families and can happen in otherwise healthy people. Resting tremor. These occur when the muscles are at rest, such as when your hands are resting in your lap. People with Parkinson's disease often have resting tremors. Postural tremor. These occur when you try to hold a pose, such as keeping your hands outstretched. Kinetic tremor. These occur during purposeful movement, such as trying to touch a finger to your nose. Task-specific tremor. These may occur when you do certain tasks such as writing, speaking, or standing. Psychogenic tremor. These are greatly reduced or go away when you are distracted. These tremors happen due to underlying stress or psychiatric disease. They can happen in people of all ages. Some types of tremors have no known cause. Tremors can also be a symptom of nervous system problems (neurological disorders) that may occur with aging. Some tremors go away with treatment, while others do not. Follow these instructions at home: Lifestyle     If you drink alcohol: Limit how much you have to: 0-1 drink a day for women who are not pregnant. 0-2 drinks a day for men. Know how much alcohol is in a drink. In the [place] one drink equals one 12 oz bottle of beer (355 mL), one 5 oz glass of wine (148 mL), or one 1 oz glass of hard liquor (44 mL). Do not use any products that contain nicotine or tobacco. These products include cigarettes, chewing tobacco, and vaping devices, such as e-cigarettes. If you need help quitting, ask your health care provider. Avoid extreme heat and extreme cold. Limit your caffeine intake, as told by your health care  provider. Try to get 8 hours of sleep each night. Find ways to manage your stress, such as meditation or yoga. General instructions Take over-the-counter and prescription medicines only as told by your health care provider. Keep all follow-up visits. This is important. Contact a health care provider if: You develop a tremor after starting a new medicine. You have a tremor along with other symptoms such as: Numbness. Tingling. Pain. Weakness. Your tremor gets worse. Your tremor interferes with your day-to-day life. Summary A tremor is trembling or shaking that you cannot control. Most tremors affect the hands or arms. Some types of tremors have no known cause. Others may be a symptom of nervous system problems (neurological disorders). Make sure you discuss any tremors you have with your health care provider. This information is not intended to replace advice given to you by your health care provider. Make sure you discuss any questions you have with your health care provider. Document Revised: 06/20/2021 Document Reviewed: 06/20/2021 Elsevier Patient Education  2024 [employer].

## 2024-09-12 ENCOUNTER — Ambulatory Visit: Payer: Self-pay | Admitting: Nurse Practitioner

## 2024-09-12 LAB — CBC WITH DIFFERENTIAL/PLATELET
Basophils Absolute: 0.1 x10E3/uL (ref 0.0–0.2)
Basos: 1 %
EOS (ABSOLUTE): 0.1 x10E3/uL (ref 0.0–0.4)
Eos: 1 %
Hematocrit: 44 % (ref 37.5–51.0)
Hemoglobin: 14.3 g/dL (ref 13.0–17.7)
Immature Grans (Abs): 0 x10E3/uL (ref 0.0–0.1)
Immature Granulocytes: 0 %
Lymphocytes Absolute: 3.5 x10E3/uL — ABNORMAL HIGH (ref 0.7–3.1)
Lymphs: 35 %
MCH: 31.6 pg (ref 26.6–33.0)
MCHC: 32.5 g/dL (ref 31.5–35.7)
MCV: 97 fL (ref 79–97)
Monocytes Absolute: 0.6 x10E3/uL (ref 0.1–0.9)
Monocytes: 6 %
Neutrophils Absolute: 5.8 x10E3/uL (ref 1.4–7.0)
Neutrophils: 57 %
Platelets: 224 x10E3/uL (ref 150–450)
RBC: 4.53 x10E6/uL (ref 4.14–5.80)
RDW: 12.8 % (ref 11.6–15.4)
WBC: 10.2 x10E3/uL (ref 3.4–10.8)

## 2024-09-12 LAB — CMP14+EGFR
ALT: 18 IU/L (ref 0–44)
AST: 22 IU/L (ref 0–40)
Albumin: 4.5 g/dL (ref 3.8–4.8)
Alkaline Phosphatase: 77 IU/L (ref 47–123)
BUN/Creatinine Ratio: 15 (ref 10–24)
BUN: 23 mg/dL (ref 8–27)
Bilirubin Total: 0.5 mg/dL (ref 0.0–1.2)
CO2: 21 mmol/L (ref 20–29)
Calcium: 9.7 mg/dL (ref 8.6–10.2)
Chloride: 100 mmol/L (ref 96–106)
Creatinine, Ser: 1.57 mg/dL — AB (ref 0.76–1.27)
Globulin, Total: 2.2 g/dL (ref 1.5–4.5)
Glucose: 94 mg/dL (ref 70–99)
Potassium: 5 mmol/L (ref 3.5–5.2)
Sodium: 136 mmol/L (ref 134–144)
Total Protein: 6.7 g/dL (ref 6.0–8.5)
eGFR: 45 mL/min/1.73 — AB

## 2024-09-12 LAB — LIPID PANEL
Cholesterol, Total: 141 mg/dL (ref 100–199)
HDL: 45 mg/dL
LDL CALC COMMENT:: 3.1 ratio (ref 0.0–5.0)
LDL Chol Calc (NIH): 75 mg/dL (ref 0–99)
Triglycerides: 119 mg/dL (ref 0–149)
VLDL Cholesterol Cal: 21 mg/dL (ref 5–40)

## 2024-09-13 ENCOUNTER — Encounter (HOSPITAL_BASED_OUTPATIENT_CLINIC_OR_DEPARTMENT_OTHER): Payer: Self-pay | Admitting: Pulmonary Disease

## 2024-09-13 ENCOUNTER — Ambulatory Visit (HOSPITAL_BASED_OUTPATIENT_CLINIC_OR_DEPARTMENT_OTHER): Admitting: Pulmonary Disease

## 2024-09-13 DIAGNOSIS — Z87891 Personal history of nicotine dependence: Secondary | ICD-10-CM

## 2024-09-13 DIAGNOSIS — J4489 Other specified chronic obstructive pulmonary disease: Secondary | ICD-10-CM | POA: Diagnosis not present

## 2024-09-13 NOTE — Patient Instructions (Signed)
 COPD-asthma overlap  History tobacco abuse --CONTINUE Symbicort  160-4.5 mcg ONE puffs in morning and evening. Monitor for tremors. --CONTINUE Albuterol  AS NEEDED for shortness of breath, chest tightness or wheezing --CONTINUE Singulair  10 mg daily --CONTINUE exercising 3-5 days a week

## 2024-09-13 NOTE — Progress Notes (Unsigned)
 "   Subjective:   PATIENT ID: Rutha LITTIE Griffin GENDER: male DOB: October 05, 1945, MRN: [id number]  Chief Complaint  Patient presents with   COPD    ONLY COPD    Reason for Visit: Shortness of breath  Mr. Jerrick Farve is a 78 year old male former smoker with COPD, HTN, HLD, hx prostate cancer in remission, basal cell carcinoma s/p excision and palpitations who presents for shortness of breath, COPD/asthma.  Initial consult 08/2022 He was recently seen in Cardiology clinic in December. Note from Dr. Hobart on 08/20/22 reviewed. He reported fatigue and shortness of breath at the time. Walking 2000-3000 stpes. Planning for repeat echo and cardiac work-up. Referred to Pulmonary to rule out lung related dyspnea  He reports since his 58s he has reported dyspnea on exertion with running and retired from [employer] at that time. He gets fatigued quickly and has to take deep breaths. Able to walk short distances but inclines are difficult. Reports associated coughing and wheezing sometimes. Cough is usually productive. Only uses symbicort  as needed.   09/03/22 - PFTs consistent with COPD-asthma overlap. Baseline symptoms shortness of breath associated with productive cough and wheezing worsened with exertion. Started on Advair 2024 - Resolved cough and wheezing on Advair. DOE remains.  11/30/22 Since our last visit, he was treated with zpack for sinusitis/URI in Feb 2024. Since then has improved. Shortness of breath with heavy exertion and walking uphill. He is using Advair as instructed. Not using rescue inhaler as often. Has not regular exercised but interested restarting running. Denies cough or wheezing.  03/11/23 Since our last visit he had an exacerbation April requiring amoxicillin  and prednisone . He developed shortness of breath and productive cough that began three weeks ago. Is feeling better some but having wheezing at night when laying down. Compliant with Advair and using rescue inhaler 3  times a week and tries not to take it often. Wheezing improves after sputum production. Denies fever, chills.  06/09/23 Since our last visit he reports doing well. He reports walking more but still has difficulty walking uphill. No coughing or wheezing. Denies exacerbations since our last visit. Very seldom using albuterol .   11/09/23 Since our last visit he reports URI over Christmas cruise and was confined to their rooms. He stopped Advair HFA due to ineffectiveness and tremors. Has not noticed a difference in shortness of breath. Yesterday able to cut down tree and limbs and loaded. Denies wheezing. Has not used his rescue. Planning to work on a corvette rebuild.  09/13/24 Since our last visit he has been on Symbicort  twice a day. Occasional chest tightness and cough with colder weather recently. Denies wheezing. No exacerbations since our last visit. Still active with yardwork, recently painted his bathroom and building an engine. Walks dog in the evening.  Social History: 1 ppd x 25 years. 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 Spent time overseas in [place] and [place] Sandblasting occasionally  Past Medical History:  Diagnosis Date   Arthritis    Asthma, chronic 06/02/2013   Carpal tunnel syndrome 04/17/2015   Bilateral   COPD (chronic obstructive pulmonary disease) (HCC)    Depression 06/02/2013   DYSPNEA ON EXERTION 06/04/2009   Qualifier: Diagnosis of  By: Debera, MD, CODY Jayson Graft    Hereditary and idiopathic peripheral neuropathy 04/17/2015   Hyperlipemia 06/02/2013   Hypertension    LEG PAIN, BILATERAL 06/04/2009   Qualifier: Diagnosis of  By: Debera, MD, CODY Jayson Graft  PERCUTANEOUS TRANSLUMINAL CORONARY ANGIOPLASTY, HX OF 06/04/2009   Qualifier: Diagnosis of  By: Debera, MD, CODY Jayson Graft    Prostate CA [facility])    and basal cell nose     Family History  Problem Relation Age of Onset   Heart disease Other         Early Cardiac disease for uncle is their 20's as well as deceased.   Heart failure Mother    Dementia Mother 12   Cancer Father 69       testicular, lung   Esophageal cancer Brother      Social History   Occupational History   Occupation: Retired    Comment: Hotel Manager  Tobacco Use   Smoking status: Former    Current packs/day: 0.00    Average packs/day: 1 pack/day for 20.0 years (20.0 ttl pk-yrs)    Types: Cigarettes    Start date: 01/18/1973    Quit date: 01/18/1993    Years since quitting: 31.6   Smokeless tobacco: Never  Vaping Use   Vaping status: Never Used  Substance and Sexual Activity   Alcohol use: Yes    Alcohol/week: 2.0 standard drinks of alcohol    Types: 2 Cans of beer per week    Comment: occasional   Drug use: No   Sexual activity: Yes    Allergies  Allergen Reactions   Fenofibrate  Other (See Comments)    Pt reports causes joint pains   Rosuvastatin  Other (See Comments)    Pt reports causes joint pains     Outpatient Medications Prior to Visit  Medication Sig Dispense Refill   albuterol  (VENTOLIN  HFA) 108 (90 Base) MCG/ACT inhaler Inhale 2 puffs into the lungs every 6 (six) hours as needed for wheezing or shortness of breath. 18 g 1   amLODipine  (NORVASC ) 5 MG tablet Take 1 tablet (5 mg total) by mouth daily. 90 tablet 1   aspirin  81 MG chewable tablet Chew 1 tablet (81 mg total) by mouth 2 (two) times daily. 30 tablet 0   atenolol  (TENORMIN ) 25 MG tablet Take 1 tablet (25 mg total) by mouth daily. 90 tablet 1   budesonide -formoterol  (SYMBICORT ) 160-4.5 MCG/ACT inhaler Inhale 2 puffs into the lungs 2 (two) times daily. 30.6 g 1   Cholecalciferol  (VITAMIN D3) 25 MCG (1000 UT) CAPS Take 1,000 Units by mouth daily.     citalopram  (CELEXA ) 20 MG tablet Take 1 tablet (20 mg total) by mouth daily. 90 tablet 1   cyanocobalamin  (VITAMIN B12) 1000 MCG/ML injection Inject 1 mL (1,000 mcg total) into the muscle every 30 (thirty) days. 3 mL 1   ezetimibe  (ZETIA ) 10  MG tablet Take 1 tablet (10 mg total) by mouth daily. 90 tablet 1   meclizine  (ANTIVERT ) 25 MG tablet Take 1 tablet (25 mg total) by mouth 3 (three) times daily as needed. 30 tablet 0   montelukast  (SINGULAIR ) 10 MG tablet Take 1 tablet (10 mg total) by mouth daily. 90 tablet 1   naproxen  (NAPROSYN ) 500 MG tablet Take 1 tablet (500 mg total) by mouth 2 (two) times daily with a meal. 60 tablet 5   pravastatin  (PRAVACHOL ) 20 MG tablet Take 0.5 tablets (10 mg total) by mouth daily. 90 tablet 1   sildenafil  (VIAGRA ) 100 MG tablet Take 100 mg by mouth daily as needed for erectile dysfunction.     traMADol  (ULTRAM ) 50 MG tablet Take 1 tablet (50 mg total) by mouth every 8 (eight) hours as needed. 20 tablet 0  triamcinolone  cream (KENALOG ) 0.1 % Apply 1 Application topically 3 (three) times daily. Avoid face and genitalia 45 g 0   No facility-administered medications prior to visit.    Review of Systems  Constitutional:  Negative for chills, diaphoresis, fever, malaise/fatigue and weight loss.  HENT:  Negative for congestion.   Respiratory:  Positive for shortness of breath. Negative for cough, hemoptysis, sputum production and wheezing.   Cardiovascular:  Negative for chest pain (chest tightness), palpitations and leg swelling.     Objective:   Vitals:   09/13/24 0950  BP: 139/65  Pulse: (!) 55  Temp: 98.6 F (37 C)  SpO2: 98%  Weight: 246 lb (111.6 kg)  Height: 6' 2 (1.88 m)   SpO2: 98 %  Physical Exam: General: Well-appearing, no acute distress HENT: [place], AT Eyes: EOMI, no scleral icterus Respiratory: Clear to auscultation bilaterally.  No crackles, wheezing or rales Cardiovascular: Mild bradycardia, RR, -M/R/G, no JVD Extremities:-Edema,-tenderness Neuro: AAO x4, CNII-XII grossly intact Psych: Normal mood, normal affect  Data Reviewed:  Imaging: CXR 12/03/22 - No acute infiltrate effusion or edema  PFT: 09/03/22 FVC 3.77 (77%) FEV1 2.51 (70%) Ratio 64  TLC 102% RV 157%  RV/TLC 145% DLCO 107% Interpretation: Based on FEV1/SVC, mild obstructive defect with significant bronchodilator response. Air trapping present.  Labs: CBC    Component Value Date/Time   WBC 10.2 09/11/2024 1406   WBC 16.2 (H) 04/26/2021 0313   RBC 4.53 09/11/2024 1406   RBC 3.73 (L) 04/26/2021 0313   HGB 14.3 09/11/2024 1406   HCT 44.0 09/11/2024 1406   PLT 224 09/11/2024 1406   MCV 97 09/11/2024 1406   MCH 31.6 09/11/2024 1406   MCH 30.8 04/26/2021 0313   MCHC 32.5 09/11/2024 1406   MCHC 32.5 04/26/2021 0313   RDW 12.8 09/11/2024 1406   LYMPHSABS 3.5 (H) 09/11/2024 1406   MONOABS 0.5 10/08/2014 1811   EOSABS 0.1 09/11/2024 1406   BASOSABS 0.1 09/11/2024 1406   Absolute eos 07/31/22 - 200     Assessment & Plan:   Discussion: 78 year old male former smoker with COPD-asthma overlap, HTN, HLD, hx prostate cancer in remission, basal cell carcinoma s/p excision and palpitations who presents for follow-up COPD/asthma. Discussed clinical course and management of COPD/asthma including bronchodilator regimen, preventive care including vaccinations (UTD on RSV, covid and influenza) and action plan for exacerbation.    Advair HFA - monitor for tremors, felt ineffective  COPD-asthma overlap - well controlled with occasional breakthrough symptoms with triggers (pollen, cold weather) History tobacco abuse --CONTINUE Symbicort  160-4.5 mcg ONE puffs in morning and evening. Monitor for tremors. --CONTINUE Albuterol  AS NEEDED for shortness of breath, chest tightness or wheezing --CONTINUE Singulair  10 mg daily --CONTINUE exercising 3-5 days a week  Health Maintenance Immunization History  Administered Date(s) Administered   Fluad Quad(high Dose 65+) 06/21/2019, 07/02/2020, 06/18/2021, 06/04/2022   Fluad Trivalent(High Dose 65+) 06/07/2023   INFLUENZA, HIGH DOSE SEASONAL PF 07/07/2016, 06/15/2017, 07/14/2018   Influenza,inj,Quad PF,6+ Mos 06/27/2013, 06/25/2014, 06/28/2015    Influenza-Unspecified 06/08/2023, 06/22/2024   Moderna Covid-19 Fall Seasonal Vaccine 16yrs & older 06/24/2023   Moderna Covid-19 Vaccine Bivalent Booster 33yrs & up 07/15/2021   Moderna SARS-COV2 Booster Vaccination 07/11/2020, 01/12/2021   Moderna Sars-Covid-2 Vaccination 10/20/2019, 11/18/2019   Pfizer(Comirnaty)Fall Seasonal Vaccine 12 years and older 07/14/2022, 06/22/2024   Pneumococcal Conjugate-13 06/28/2015   Pneumococcal Polysaccharide-23 10/27/2011   Respiratory Syncytial Virus Vaccine ,Recomb Aduvanted(Arexvy ) 06/25/2022, 06/09/2023   Tdap 07/07/2016, 02/24/2023   Zoster Recombinant(Shingrix ) 12/09/2017, 02/16/2018  CT Lung Screen - not qualified. Quit smoking > 15 years  No orders of the defined types were placed in this encounter.  No orders of the defined types were placed in this encounter.   Return in about 6 months (around 03/13/2025).   MDM Low  Bryli Mantey Slater Staff, MD [facility] 09/13/2024 10:37 AM    "

## 2024-09-16 ENCOUNTER — Other Ambulatory Visit: Payer: Self-pay | Admitting: Nurse Practitioner

## 2024-10-03 ENCOUNTER — Telehealth: Payer: Self-pay | Admitting: Family Medicine

## 2024-10-03 ENCOUNTER — Other Ambulatory Visit: Payer: Self-pay | Admitting: Nurse Practitioner

## 2024-10-03 DIAGNOSIS — E782 Mixed hyperlipidemia: Secondary | ICD-10-CM

## 2024-10-03 MED ORDER — PRAVASTATIN SODIUM 20 MG PO TABS: 20.0000 mg | ORAL_TABLET | Freq: Every day | ORAL | 1 refills | Status: AC

## 2024-10-03 NOTE — Telephone Encounter (Signed)
 Copied from CRM #[id number]. Topic: Clinical - Medication Question >> Oct 03, 2024  9:08 AM Myrick T wrote: Reason for CRM: patients wife called stated he only has one pill left of the pravastatin  (PRAVACHOL ) 20 MG tablet

## 2024-10-03 NOTE — Telephone Encounter (Signed)
 Called and spoke with patients wife. She confirmed that patient was taking 1/2 tablet of his Pravastatin  but then started taking 1 whole tablet because he saw that his triglyceride levels had increased. So now patient is out of his medicine early and can't get a refill.  Requesting PCP send in new Rx with new directions of him to take 1 tablet daily instead of 0.5 tab daily.

## 2024-10-03 NOTE — Telephone Encounter (Signed)
 Returned patients call and patient states that MMM already called him and he is aware.

## 2024-10-03 NOTE — Telephone Encounter (Signed)
 DUPLICATE MESSAGE. Already addressed with patients wife and routed to PCP to advise on.

## 2024-10-03 NOTE — Telephone Encounter (Signed)
 Will refill pravaststin-but that does not work for [facility]. Only works for LDL. Trig are elevated because of blood sugars. Can call in meds that will help.

## 2024-11-03 ENCOUNTER — Ambulatory Visit: Payer: Self-pay

## 2025-01-26 ENCOUNTER — Ambulatory Visit: Admitting: Diagnostic Neuroimaging

## 2025-02-26 ENCOUNTER — Ambulatory Visit (HOSPITAL_BASED_OUTPATIENT_CLINIC_OR_DEPARTMENT_OTHER): Admitting: Pulmonary Disease

## 2025-03-13 ENCOUNTER — Ambulatory Visit: Admitting: Nurse Practitioner
